# Patient Record
Sex: Male | Born: 1955 | Race: White | Hispanic: No | Marital: Single | Smoking: Current every day smoker
Health system: Southern US, Community
[De-identification: ages and names within clinical notes are randomized; demographics above are authoritative.]

## PROBLEM LIST (undated history)

## (undated) DIAGNOSIS — K5730 Diverticulosis of large intestine without perforation or abscess without bleeding: Secondary | ICD-10-CM

## (undated) DIAGNOSIS — K649 Unspecified hemorrhoids: Secondary | ICD-10-CM

## (undated) DIAGNOSIS — G4733 Obstructive sleep apnea (adult) (pediatric): Secondary | ICD-10-CM

## (undated) DIAGNOSIS — Z9884 Bariatric surgery status: Secondary | ICD-10-CM

## (undated) DIAGNOSIS — R600 Localized edema: Secondary | ICD-10-CM

## (undated) DIAGNOSIS — Z8639 Personal history of other endocrine, nutritional and metabolic disease: Secondary | ICD-10-CM

## (undated) DIAGNOSIS — K603 Anal fistula, unspecified: Secondary | ICD-10-CM

## (undated) DIAGNOSIS — Z8709 Personal history of other diseases of the respiratory system: Secondary | ICD-10-CM

## (undated) DIAGNOSIS — E785 Hyperlipidemia, unspecified: Secondary | ICD-10-CM

## (undated) DIAGNOSIS — E60 Dietary zinc deficiency: Secondary | ICD-10-CM

## (undated) DIAGNOSIS — Z973 Presence of spectacles and contact lenses: Secondary | ICD-10-CM

## (undated) DIAGNOSIS — F329 Major depressive disorder, single episode, unspecified: Secondary | ICD-10-CM

## (undated) DIAGNOSIS — I10 Essential (primary) hypertension: Secondary | ICD-10-CM

## (undated) DIAGNOSIS — J439 Emphysema, unspecified: Secondary | ICD-10-CM

## (undated) DIAGNOSIS — Z860100 Personal history of colon polyps, unspecified: Secondary | ICD-10-CM

## (undated) DIAGNOSIS — N401 Enlarged prostate with lower urinary tract symptoms: Secondary | ICD-10-CM

## (undated) DIAGNOSIS — F411 Generalized anxiety disorder: Secondary | ICD-10-CM

## (undated) DIAGNOSIS — N529 Male erectile dysfunction, unspecified: Secondary | ICD-10-CM

## (undated) DIAGNOSIS — E559 Vitamin D deficiency, unspecified: Secondary | ICD-10-CM

## (undated) HISTORY — DX: Essential (primary) hypertension: I10

## (undated) HISTORY — DX: Dietary zinc deficiency: E60

## (undated) HISTORY — DX: Bariatric surgery status: Z98.84

## (undated) HISTORY — PX: GASTROPLASTY DUODENAL SWITCH: SHX1699

## (undated) HISTORY — PX: IRRIGATION AND DEBRIDEMENT SEBACEOUS CYST: SHX5255

## (undated) HISTORY — DX: Unspecified hemorrhoids: K64.9

## (undated) HISTORY — DX: Hyperlipidemia, unspecified: E78.5

---

## 1986-03-19 HISTORY — PX: CHEST TUBE INSERTION: SHX231

## 2007-02-11 ENCOUNTER — Encounter: Payer: Self-pay | Admitting: Endocrinology

## 2007-07-29 ENCOUNTER — Encounter: Payer: Self-pay | Admitting: Endocrinology

## 2007-08-12 ENCOUNTER — Encounter: Payer: Self-pay | Admitting: Endocrinology

## 2009-03-24 ENCOUNTER — Ambulatory Visit: Payer: Self-pay | Admitting: Family Medicine

## 2009-03-24 DIAGNOSIS — I10 Essential (primary) hypertension: Secondary | ICD-10-CM | POA: Insufficient documentation

## 2009-03-24 DIAGNOSIS — E349 Endocrine disorder, unspecified: Secondary | ICD-10-CM | POA: Insufficient documentation

## 2009-03-24 DIAGNOSIS — E785 Hyperlipidemia, unspecified: Secondary | ICD-10-CM | POA: Insufficient documentation

## 2009-03-24 DIAGNOSIS — F341 Dysthymic disorder: Secondary | ICD-10-CM | POA: Insufficient documentation

## 2009-04-05 ENCOUNTER — Ambulatory Visit: Payer: Self-pay | Admitting: Family Medicine

## 2009-04-06 ENCOUNTER — Telehealth: Payer: Self-pay | Admitting: Family Medicine

## 2009-04-19 ENCOUNTER — Ambulatory Visit: Payer: Self-pay | Admitting: Family Medicine

## 2009-05-12 ENCOUNTER — Telehealth: Payer: Self-pay | Admitting: Family Medicine

## 2009-05-17 ENCOUNTER — Telehealth: Payer: Self-pay | Admitting: Family Medicine

## 2009-05-17 ENCOUNTER — Ambulatory Visit: Payer: Self-pay | Admitting: Family Medicine

## 2009-05-18 ENCOUNTER — Ambulatory Visit: Payer: Self-pay | Admitting: Family Medicine

## 2009-05-30 ENCOUNTER — Encounter: Payer: Self-pay | Admitting: Family Medicine

## 2009-06-07 ENCOUNTER — Telehealth: Payer: Self-pay | Admitting: Family Medicine

## 2009-06-08 ENCOUNTER — Ambulatory Visit: Payer: Self-pay | Admitting: Family Medicine

## 2009-06-30 ENCOUNTER — Ambulatory Visit: Payer: Self-pay | Admitting: Family Medicine

## 2009-07-06 ENCOUNTER — Encounter: Payer: Self-pay | Admitting: Family Medicine

## 2009-07-06 ENCOUNTER — Telehealth: Payer: Self-pay | Admitting: Family Medicine

## 2009-07-07 ENCOUNTER — Encounter: Payer: Self-pay | Admitting: Family Medicine

## 2009-07-13 ENCOUNTER — Encounter: Payer: Self-pay | Admitting: Family Medicine

## 2009-07-17 ENCOUNTER — Encounter: Payer: Self-pay | Admitting: Family Medicine

## 2009-07-26 ENCOUNTER — Telehealth: Payer: Self-pay | Admitting: Family Medicine

## 2009-07-26 ENCOUNTER — Ambulatory Visit: Payer: Self-pay | Admitting: Family Medicine

## 2009-07-26 ENCOUNTER — Encounter: Payer: Self-pay | Admitting: Family Medicine

## 2009-07-26 LAB — CONVERTED CEMR LAB: Testosterone: 242.06 ng/dL — ABNORMAL LOW (ref 350.00–890.00)

## 2009-08-11 ENCOUNTER — Encounter: Payer: Self-pay | Admitting: Family Medicine

## 2009-08-11 ENCOUNTER — Telehealth: Payer: Self-pay | Admitting: Family Medicine

## 2009-08-16 ENCOUNTER — Ambulatory Visit: Payer: Self-pay | Admitting: Family Medicine

## 2009-08-18 ENCOUNTER — Telehealth: Payer: Self-pay | Admitting: Family Medicine

## 2009-08-18 ENCOUNTER — Encounter: Payer: Self-pay | Admitting: Family Medicine

## 2009-09-06 ENCOUNTER — Ambulatory Visit: Payer: Self-pay | Admitting: Family Medicine

## 2009-09-14 ENCOUNTER — Encounter: Payer: Self-pay | Admitting: Family Medicine

## 2009-09-15 ENCOUNTER — Telehealth: Payer: Self-pay | Admitting: Family Medicine

## 2009-09-22 ENCOUNTER — Ambulatory Visit: Payer: Self-pay | Admitting: Family Medicine

## 2009-10-06 ENCOUNTER — Ambulatory Visit: Payer: Self-pay | Admitting: Family Medicine

## 2009-10-08 ENCOUNTER — Emergency Department (HOSPITAL_COMMUNITY): Admission: EM | Admit: 2009-10-08 | Discharge: 2009-10-08 | Payer: Self-pay | Admitting: Emergency Medicine

## 2009-10-27 ENCOUNTER — Ambulatory Visit: Payer: Self-pay | Admitting: Family Medicine

## 2009-11-09 LAB — CONVERTED CEMR LAB: Testosterone: 382.94 ng/dL (ref 350.00–890.00)

## 2009-11-10 ENCOUNTER — Ambulatory Visit: Payer: Self-pay | Admitting: Family Medicine

## 2009-12-07 ENCOUNTER — Telehealth (INDEPENDENT_AMBULATORY_CARE_PROVIDER_SITE_OTHER): Payer: Self-pay | Admitting: *Deleted

## 2009-12-16 ENCOUNTER — Ambulatory Visit: Payer: Self-pay | Admitting: Family Medicine

## 2009-12-29 ENCOUNTER — Encounter: Payer: Self-pay | Admitting: Family Medicine

## 2010-01-06 ENCOUNTER — Encounter: Payer: Self-pay | Admitting: Family Medicine

## 2010-01-26 ENCOUNTER — Ambulatory Visit: Payer: Self-pay | Admitting: Family Medicine

## 2010-01-26 DIAGNOSIS — R0989 Other specified symptoms and signs involving the circulatory and respiratory systems: Secondary | ICD-10-CM | POA: Insufficient documentation

## 2010-01-26 DIAGNOSIS — Z720 Tobacco use: Secondary | ICD-10-CM | POA: Insufficient documentation

## 2010-01-26 DIAGNOSIS — R0609 Other forms of dyspnea: Secondary | ICD-10-CM

## 2010-01-26 DIAGNOSIS — F17200 Nicotine dependence, unspecified, uncomplicated: Secondary | ICD-10-CM

## 2010-01-26 DIAGNOSIS — R5383 Other fatigue: Secondary | ICD-10-CM

## 2010-01-26 DIAGNOSIS — R609 Edema, unspecified: Secondary | ICD-10-CM | POA: Insufficient documentation

## 2010-01-26 DIAGNOSIS — E663 Overweight: Secondary | ICD-10-CM | POA: Insufficient documentation

## 2010-01-26 DIAGNOSIS — R5381 Other malaise: Secondary | ICD-10-CM | POA: Insufficient documentation

## 2010-01-26 DIAGNOSIS — M79609 Pain in unspecified limb: Secondary | ICD-10-CM | POA: Insufficient documentation

## 2010-01-27 ENCOUNTER — Ambulatory Visit: Payer: Self-pay | Admitting: Endocrinology

## 2010-01-27 LAB — CONVERTED CEMR LAB
Albumin: 4 g/dL (ref 3.5–5.2)
BUN: 14 mg/dL (ref 6–23)
Basophils Absolute: 0 10*3/uL (ref 0.0–0.1)
Chloride: 105 meq/L (ref 96–112)
Eosinophils Absolute: 0.2 10*3/uL (ref 0.0–0.7)
Eosinophils Relative: 3.4 % (ref 0.0–5.0)
Glucose, Bld: 86 mg/dL (ref 70–99)
Hemoglobin: 17.1 g/dL — ABNORMAL HIGH (ref 13.0–17.0)
Lymphs Abs: 2.2 10*3/uL (ref 0.7–4.0)
MCV: 95 fL (ref 78.0–100.0)
Neutro Abs: 4 10*3/uL (ref 1.4–7.7)
RDW: 14 % (ref 11.5–14.6)
Sodium: 141 meq/L (ref 135–145)
Uric Acid, Serum: 6.3 mg/dL (ref 4.0–7.8)

## 2010-02-06 ENCOUNTER — Telehealth: Payer: Self-pay | Admitting: Endocrinology

## 2010-02-06 ENCOUNTER — Ambulatory Visit: Payer: Self-pay | Admitting: Endocrinology

## 2010-02-20 ENCOUNTER — Ambulatory Visit: Payer: Self-pay | Admitting: Pulmonary Disease

## 2010-02-20 ENCOUNTER — Ambulatory Visit: Payer: Self-pay | Admitting: Endocrinology

## 2010-02-20 DIAGNOSIS — G4733 Obstructive sleep apnea (adult) (pediatric): Secondary | ICD-10-CM | POA: Insufficient documentation

## 2010-03-02 ENCOUNTER — Ambulatory Visit (HOSPITAL_BASED_OUTPATIENT_CLINIC_OR_DEPARTMENT_OTHER)
Admission: RE | Admit: 2010-03-02 | Discharge: 2010-03-02 | Payer: Self-pay | Source: Home / Self Care | Attending: Pulmonary Disease | Admitting: Pulmonary Disease

## 2010-03-02 ENCOUNTER — Ambulatory Visit: Admission: RE | Admit: 2010-03-02 | Payer: Self-pay | Source: Home / Self Care | Admitting: Pulmonary Disease

## 2010-03-08 ENCOUNTER — Ambulatory Visit: Payer: Self-pay | Admitting: Pulmonary Disease

## 2010-03-14 ENCOUNTER — Ambulatory Visit: Payer: Self-pay | Admitting: Endocrinology

## 2010-03-16 ENCOUNTER — Ambulatory Visit
Admission: RE | Admit: 2010-03-16 | Discharge: 2010-03-16 | Payer: Self-pay | Source: Home / Self Care | Attending: Internal Medicine | Admitting: Internal Medicine

## 2010-03-16 DIAGNOSIS — G4700 Insomnia, unspecified: Secondary | ICD-10-CM | POA: Insufficient documentation

## 2010-04-07 ENCOUNTER — Telehealth: Payer: Self-pay | Admitting: Internal Medicine

## 2010-04-12 ENCOUNTER — Other Ambulatory Visit: Payer: Self-pay | Admitting: Endocrinology

## 2010-04-12 ENCOUNTER — Ambulatory Visit
Admission: RE | Admit: 2010-04-12 | Discharge: 2010-04-12 | Payer: Self-pay | Source: Home / Self Care | Attending: Endocrinology | Admitting: Endocrinology

## 2010-04-12 LAB — TESTOSTERONE: Testosterone: 180.05 ng/dL — ABNORMAL LOW (ref 350.00–890.00)

## 2010-04-18 NOTE — Assessment & Plan Note (Signed)
Summary: med check/refill/disscuss lab results/cjr   Vital Signs:  Patient profile:   55 year old male Weight:      236 pounds O2 Sat:      97 % Temp:     98.4 degrees F Pulse rate:   92 / minute BP sitting:   120 / 78  (left arm) Cuff size:   large  Vitals Entered By: Pura Spice, RN (April 05, 2009 3:17 PM) CC: go over lab results and talk about concerta  Is Patient Diabetic? No   History of Present Illness: this 55 year old white male has been seen as a new patient and had testosterone level checked and found to be 170 which he has had past history of hypogonadism and receiving testosterone injections previously He is in to discuss further treatment as well as renewed his Concerta. Patient has been informed to lisinopril with a psychiatrist to have all of his psychotherapeutic medications I have agreed to give him his Concerta for 2 months until he can see a psychiatrist in [place] Blood pressure is stable No new complaints, patient will not see Dr. Betti Cruz so he will arrange his own psychiatric referral  Past History:  Past Medical History: history hypogonadal function chronic anxiety depression  Review of Systems  The patient denies anorexia, fever, weight loss, weight gain, vision loss, decreased hearing, hoarseness, chest pain, syncope, dyspnea on exertion, peripheral edema, prolonged cough, headaches, hemoptysis, abdominal pain, melena, hematochezia, severe indigestion/heartburn, hematuria, incontinence, genital sores, muscle weakness, suspicious skin lesions, transient blindness, difficulty walking, depression, unusual weight change, abnormal bleeding, enlarged lymph nodes, angioedema, breast masses, and testicular masses.    Physical Exam  General:  Well-developed,well-nourished,in no acute distress; alert,appropriate and cooperative throughout examinationoverweight-appearing.   Lungs:  Normal respiratory effort, chest expands symmetrically. Lungs are clear to  auscultation, no crackles or wheezes. Heart:  Normal rate and regular rhythm. S1 and S2 normal without gallop, murmur, click, rub or other extra sounds. Extremities:  No clubbing, cyanosis, edema, or deformity noted with normal full range of motion of all joints.     Impression & Recommendations:  Problem # 1:  HYPERLIPIDEMIA (ICD-272.4) Assessment Unchanged  His updated medication list for this problem includes:    Lipitor 10 Mg Tabs (Atorvastatin calcium) .Marland Kitchen... 1 by mouth once daily    Tricor 48 Mg Tabs (Fenofibrate) .Marland Kitchen... 1 qd  Problem # 2:  ESSENTIAL HYPERTENSION (ICD-401.9) Assessment: Improved  His updated medication list for this problem includes:    Bystolic 5 Mg Tabs (Nebivolol hcl) .Marland Kitchen... 2 by mouth once daily  Problem # 3:  ANXIETY DEPRESSION (ICD-300.4) Assessment: Unchanged  Problem # 4:  TESTICULAR HYPOFUNCTION (ICD-257.2) Assessment: Deteriorated  Complete Medication List: 1)  Concerta 54 Mg Cr-tabs (Methylphenidate hcl) .Marland Kitchen.. 1 by mouth once daily 2)  Cymbalta 60 Mg Cpep (Duloxetine hcl) .Marland Kitchen.. 1 by mouth once daily 3)  Lipitor 10 Mg Tabs (Atorvastatin calcium) .Marland Kitchen.. 1 by mouth once daily 4)  Tricor  .... Pt would not tell strength 5)  Wellbutrin Xl 300 Mg Xr24h-tab (Bupropion hcl) .Marland Kitchen.. 1 by mouth once daily 6)  Wellbutrin Xl 150 Mg Xr24h-tab (Bupropion hcl) .Marland Kitchen.. 1 by mouth once daily 7)  Viagra 100 Mg Tabs (Sildenafil citrate) 8)  Ambien 10 Mg Tabs (Zolpidem tartrate) 9)  Geodon 20 Mg Caps (Ziprasidone hcl) .... 2 capsules at hs 10)  Bystolic 5 Mg Tabs (Nebivolol hcl) .... 2 by mouth once daily 11)  Valium 5 Mg Tabs (Diazepam) .... Three times a day  12)  Tricor 48 Mg Tabs (Fenofibrate) .Marland Kitchen.. 1 qd 13)  Concerta 54 Mg Cr-tabs (Methylphenidate hcl) .Marland Kitchen.. 1 once daily, fill on 17 february 14)  Depo-testosterone 100 Mg/ml Oil (Testosterone cypionate) .... 2.0 cc im monthly  Patient Instructions: 1)  we'll give you a prescription for Concerta for 2 months and you can  arrange Pulmo-Aid with a psychiatrist 2)  have given you a prescription for testosterone, Depotestosterone and then you can return for injection 3)  Please arrange for psychiatric appointment as soon as hospital Prescriptions: DEPO-TESTOSTERONE 100 MG/ML OIL (TESTOSTERONE CYPIONATE) 2.0 cc IM monthly  #10 cc x 0   Entered and Authorized by:   Judithann Sheen MD   Signed by:   Judithann Sheen MD on 04/05/2009   Method used:   Print then Give to Patient   RxID:   [id number] CONCERTA 54 MG CR-TABS (METHYLPHENIDATE HCL) 1 once daily, fill on 17 February  #30 x 0   Entered and Authorized by:   Judithann Sheen MD   Signed by:   Judithann Sheen MD on 04/05/2009   Method used:   Print then Give to Patient   RxID:   [id number] CONCERTA 54 MG CR-TABS (METHYLPHENIDATE HCL) 1 by mouth once daily  #30 x 0   Entered and Authorized by:   Judithann Sheen MD   Signed by:   Judithann Sheen MD on 04/05/2009   Method used:   Print then Give to Patient   RxID:   [id number]

## 2010-04-18 NOTE — Progress Notes (Signed)
Summary: rx concerta   Phone Note Call from Patient   Caller: Patient Summary of Call: pt requesting refill concerta er 54 mg  Initial call taken by: Pura Spice, RN,  September 15, 2009 10:15 AM  Follow-up for Phone Call        per dr Scotty Court will do this time only then needs to see Dr Dimple Casey for his rx's . pt aware and will contact dr rice next time rx at front desk and pt to pick up today.  Follow-up by: Pura Spice, RN,  September 15, 2009 10:16 AM    New/Updated Medications: CONCERTA 54 MG CR-TABS (METHYLPHENIDATE HCL) 1 by mouth once daily Prescriptions: CONCERTA 54 MG CR-TABS (METHYLPHENIDATE HCL) 1 by mouth once daily  #30 x 0   Entered by:   Pura Spice, RN   Authorized by:   Judithann Sheen MD   Signed by:   Pura Spice, RN on 09/15/2009   Method used:   Print then Give to Patient   RxID:   [id number]

## 2010-04-18 NOTE — Progress Notes (Signed)
Summary: wants to speak with Dr Scotty Court  Phone Note Call from Patient Call back at [facility] Phone [phone]   Caller: Patient-live call Summary of Call: Need to speak with Dr Scotty Court, briefly. He refused to leave additional message. Initial call taken by: Warnell Forester,  April 06, 2009 1:18 PM  Follow-up for Phone Call        called by dr Scotty Court  Follow-up by: Pura Spice, RN,  April 06, 2009 4:21 PM

## 2010-04-18 NOTE — Assessment & Plan Note (Signed)
Summary: TESTOSTERONE INJ//SLM (STAFFORD)  left gluteal/gh  Nurse Visit   Medication Administration  Injection # 1:    Medication: Testosterone Cypionat 200mg  ing    Diagnosis: TESTICULAR HYPOFUNCTION (ICD-257.2)    Route: IM    Site: LUOQ gluteus    Exp Date: 03/2011    Lot #: [id number]    Mfr: paddock    Comments: 200 mg /ml --  400mg  given     Patient tolerated injection without complications    Given by: Pura Spice, RN (November 10, 2009 11:41 AM)  Orders Added: 1)  Admin of patients own med IM/SQ [phone]

## 2010-04-18 NOTE — Progress Notes (Signed)
Summary: referral  Phone Note Call from Patient   Caller: Patient Call For: Judithann Sheen MD Summary of Call: wants referral to endocrinologist re testosterone ASAP ph [phone] Initial call taken by: Raechel Ache, RN,  May 12, 2009 9:31 AM  Follow-up for Phone Call        refer to dr Juleen China per dr Alfonzo Feller  Follow-up by: Pura Spice, RN,  May 12, 2009 1:43 PM  Additional Follow-up for Phone Call Additional follow up Details #1::        Appt Scheduled.  Pt aware. Additional Follow-up by: Corky Mull,  May 16, 2009 10:48 AM

## 2010-04-18 NOTE — Medication Information (Signed)
Summary: Rx Clarification Request-Viagra  Rx Clarification Request-Viagra   Imported By: Maryln Gottron 07/14/2009 10:56:14  _____________________________________________________________________  External Attachment:    Type:   Image     Comment:   External Document

## 2010-04-18 NOTE — Progress Notes (Signed)
Summary: Flu vaccination      Immunization History:  Influenza Immunization History:    Influenza:  historical (12/07/2009)  

## 2010-04-18 NOTE — Assessment & Plan Note (Signed)
Summary: INJ/RCD  left gluteal   Nurse Visit   Medication Administration  Injection # 1:    Medication: Testosterone Cypionat 200mg  ing    Diagnosis: TESTICULAR HYPOFUNCTION (ICD-257.2)    Route: IM    Site: LUOQ gluteus    Exp Date: 07/2011    Lot #: [id number]    Mfr: watson     Patient tolerated injection without complications    Given by: Pura Spice, RN (May 17, 2009 10:35 AM)  Orders Added: 1)  Admin of patients own med IM/SQ [phone]

## 2010-04-18 NOTE — Assessment & Plan Note (Signed)
Summary: testosterone inj/njr/pt rsc/cjr/pt rescd//ccm  rt gluteal   Nurse Visit   Medication Administration  Injection # 1:    Medication: Testosterone Cypionat 200mg  ing    Diagnosis: TESTICULAR HYPOFUNCTION (ICD-257.2)    Route: IM    Site: RUOQ gluteus    Exp Date: 03/2011    Lot #: [id number]    Mfr: paddock    Comments: 400mg  given (2 Ml)     Patient tolerated injection without complications    Given by: Pura Spice, RN (October 27, 2009 2:19 PM)  Orders Added: 1)  Admin of patients own med IM/SQ [phone]

## 2010-04-18 NOTE — Letter (Signed)
Summary: Patient requesting refill on Concerta  Patient requesting refill on Concerta   Imported By: Maryln Gottron 09/20/2009 14:40:01  _____________________________________________________________________  External Attachment:    Type:   Image     Comment:   External Document

## 2010-04-18 NOTE — Letter (Signed)
Summary: Letter of recommendations for treatment plan.  Dismissal Letter   Imported By: Maryln Gottron 01/04/2010 10:28:37  _____________________________________________________________________  External Attachment:    Type:   Image     Comment:   External Document  Appended Document: Letter of recommendations for treatment plan. Letter sent by certified mail.

## 2010-04-18 NOTE — Medication Information (Signed)
Summary: Coverage Approval for Viagra  Coverage Approval for Viagra   Imported By: Maryln Gottron 07/18/2009 13:37:30  _____________________________________________________________________  External Attachment:    Type:   Image     Comment:   External Document

## 2010-04-18 NOTE — Progress Notes (Signed)
Summary: questions about testesterone inj   Phone Note Call from Patient   Caller: Patient Summary of Call: pt came in today for testesterone inj and wnats to know if can increase or come in sooner than monthly. States he thinks needs sooner than 1 month. pls call  Initial call taken by: Pura Spice, RN,  May 17, 2009 10:38 AM  Follow-up for Phone Call        dr Scotty Court called pt and instructed to take 400mg  testesterone inj every 3 weeks x 4 then reck testesterone level. pt to come in today for injection.  Follow-up by: Pura Spice, RN,  May 18, 2009 8:06 AM

## 2010-04-18 NOTE — Assessment & Plan Note (Signed)
Summary: NEW ENDO LOW TESTOSTORONE PT-PER PT PER DR STAFFORD-#-BCBS--P...   Vital Signs:  Patient profile:   55 year old male Height:      73 inches (185.42 cm) Weight:      262.13 pounds (119.15 kg) BMI:     34.71 O2 Sat:      94 % on Room air Temp:     98.5 degrees F (36.94 degrees C) oral Pulse rate:   84 / minute BP sitting:   126 / 76  (left arm) Cuff size:   large  Vitals Entered By: Brenton Grills CMA (AAMA) (January 27, 2010 10:02 AM)  O2 Flow:  Room air CC: New Endo/Lost Testosterone/Dr. Patsey Berthold Is Patient Diabetic? No   Referring Provider:  Judithann Sheen MD Primary Provider:  Judithann Sheen MD  CC:  New Endo/Lost Testosterone/Dr. Patsey Berthold.  History of Present Illness: pt was dx'ed with hpogonadism in 2009.  no cause was found.  he has been on testosterone injections off and on since then.  most recently, he has been on 400 mg im every 2 weeks.   symptomatically, he has a few years of moderate muscle weakness, worst at the arms, and assoc decreased libido.  he says he was advised by 2 psychiatrists to keep his etstosterone level at 120% of normal.  Current Medications (verified): 1)  Concerta 54 Mg Cr-Tabs (Methylphenidate Hcl) .Marland Kitchen.. 1 By Mouth Once Daily 2)  Lipitor 10 Mg Tabs (Atorvastatin Calcium) .Marland Kitchen.. 1 By Mouth Once Daily 3)  Tricor .... Pt Would Not Tell Strength 4)  Wellbutrin Xl 300 Mg Xr24h-Tab (Bupropion Hcl) .Marland Kitchen.. 1 By Mouth Once Daily 5)  Wellbutrin Xl 150 Mg Xr24h-Tab (Bupropion Hcl) .Marland Kitchen.. 1 By Mouth Once Daily 6)  Viagra 100 Mg Tabs (Sildenafil Citrate) 7)  Ambien 10 Mg Tabs (Zolpidem Tartrate) .... Take 1 By Mouth Every Night 8)  Geodon 20 Mg Caps (Ziprasidone Hcl) .... 2 Capsules At [facility] 9)  Bystolic 5 Mg Tabs (Nebivolol Hcl) .... 2 By Mouth Once Daily 10)  Valium 5 Mg Tabs (Diazepam) .... Three Times A Day 11)  Tricor 48 Mg Tabs (Fenofibrate) .Marland Kitchen.. 1 Qd 12)  Concerta 54 Mg Cr-Tabs (Methylphenidate Hcl) .Marland Kitchen.. 1 Once Daily, Fill On  June 08 2009 13)  Depo-Testosterone 200 Mg/ml Oil (Testosterone Cypionate) .... Inject  400mg  Im Every 2 Weeks For 2 Months Then Resume Every 3 Wks. 14)  Moviprep 100 Gm Solr (Peg-Kcl-Nacl-Nasulf-Na Asc-C) .... Take As Directed. 15)  Clobetasol Propionate 0.05 % Soln (Clobetasol Propionate) .... Use To Scalp As Directed 16)  Cymbalta 30 Mg Cpep (Duloxetine Hcl) .... Take 3 Per Day 17)  Lasix 20 Mg Tabs (Furosemide) .Marland Kitchen.. 1 Tab By Mouth Daily As Needed Edema 18)  Naproxen 500 Mg Tabs (Naproxen) .Marland Kitchen.. 1 Tab By Mouth Two Times A Day As Needed Pain With Food  Allergies (verified): No Known Drug Allergies  Past History:  Past Medical History: Last updated: 04/06/2009 history hypogonadal function chronic anxiety depression testing for HIV  Hyperlipidemia Urnary track Infections  Family History: Reviewed history from 04/06/2009 and no changes required. parents ovary canncer  grandparents elevated cholesterol heart diasase stroke high blood pressure.  Dr Jacklynn Barnacle [place]  Dr Loyal Gambler, [place]  no probs with testosterone  Social History: Reviewed history from 04/06/2009 and no changes required. Occupation: [employer] Current Smoker Alcohol Use - yes Regular Exercise - no Does Patient Exercise:  no Seat Belt Use:  yes  Review  of Systems       The patient complains of weight gain and peripheral edema.         denies polyuria, numbness, decreased urinary stream, gynecomastia, fever, headache, easy bruising, sob, rash, blurry vision, chest pain.  he reports fatigue, but says that could be due to sleep apnea. he says depression improves with normalization of his testosterone level.    viagra helps ed sxs.    Physical Exam  General:  normal appearance.   Head:  head: no deformity eyes: no periorbital swelling, no proptosis external nose and ears are normal mouth: no lesion seen Neck:  No deformities, masses, or tenderness noted. Breasts:   no gynecomastia Lungs:  Clear to auscultation bilaterally. Normal respiratory effort.  Heart:  Regular rate and rhythm without murmurs or gallops noted. Normal S1,S2.   Abdomen:  abdomen is soft, nontender.  no hepatosplenomegaly.   not distended.  no hernia.  Msk:  muscle bulk and strength are grossly normal.  no obvious joint swelling.  gait is normal and steady  Pulses:  dorsalis pedis intact bilat.  no carotid bruit  Extremities:  no deformity.  no ulcer on the feet.  feet are of normal color and temp.   1+ right pedal edema and 1+ left pedal edema.   Neurologic:  cn 2-12 grossly intact.   readily moves all 4's.   sensation is intact to touch on the feet  Skin:  normal texture and temp.  no rash.  not diaphoretic  Cervical Nodes:  No significant adenopathy.  Psych:  Alert and cooperative; normal mood and affect; normal attention span and concentration.   Additional Exam:  last testosterone level (16 days after most recent dose, of 200 mg) was 383   Impression & Recommendations:  Problem # 1:  TESTICULAR HYPOFUNCTION (ICD-257.2) uncertain etiology his request to keep his testosterone level at 120% of normal cannot safely be honored.    Problem # 2:  ANXIETY DEPRESSION (ICD-300.4) ? worsened by #1  Problem # 3:  sleep apnea could be exac by testosterone rx  Problem # 4:  PERIPHERAL EDEMA (ICD-782.3) could be caused or exac by testosterone injections  Medications Added to Medication List This Visit: 1)  Depo-testosterone 200 Mg/ml Oil (Testosterone cypionate) .Marland Kitchen.. 100 mg (0.5 cc), every week 2)  Syringe 22g X 1" 3 Ml Misc (Syringe/needle (disp)) .Marland Kitchen.. 1 every week, and 23 g needles for injection  Other Orders: Consultation Level IV (45409)  Patient Instructions: 1)  normalization of testosterone is not known to harm you.  however, there are "theoretical" risks, including increased fertility, hair loss, prostate cancer, benign prostate enlargement, lower hdl ("good  cholesterol"), sleep apnea, and behavior changes. 2)  please sign release of infomation for 2009 blood tests with dr Delford Field [facility], [place]). 3)  take testosterone 100 mg intramuscularly, every week. 4)  just before the 4th dose, go to the lab for testosterone level 257.2. 5)  please call [phone] to hear your test results. Prescriptions: DEPO-TESTOSTERONE 200 MG/ML OIL (TESTOSTERONE CYPIONATE) 100 mg (0.5 cc), every week  #10 cc x 1   Entered and Authorized by:   Minus Breeding MD   Signed by:   Minus Breeding MD on 01/27/2010   Method used:   Print then Give to Patient   RxID:   [id number] SYRINGE 22G X 1" 3 ML MISC (SYRINGE/NEEDLE (DISP)) 1 every week, and 23 g needles for injection  #10 x 1   Entered and Authorized by:  Minus Breeding MD   Signed by:   Minus Breeding MD on 01/27/2010   Method used:   Print then Give to Patient   RxID:   [id number] DEPO-TESTOSTERONE 200 MG/ML OIL (TESTOSTERONE CYPIONATE) 100 mg (0.5 cc), every week  #1 vial x 1   Entered and Authorized by:   Minus Breeding MD   Signed by:   Minus Breeding MD on 01/27/2010   Method used:   Print then Give to Patient   RxID:   [id number]    Orders Added: 1)  Consultation Level IV [84696]

## 2010-04-18 NOTE — Progress Notes (Signed)
Summary: rx cymbalta ok x 1 only   Phone Note Call from Patient   Caller: Patient Summary of Call: psychiatriast out of town wants refill cymbalta  Initial call taken by: Pura Spice, RN,  August 18, 2009 5:16 PM  Follow-up for Phone Call        ok per dr Scotty Court  x 1 only  Follow-up by: Pura Spice, RN,  August 18, 2009 5:16 PM    New/Updated Medications: CYMBALTA 30 MG CPEP (DULOXETINE HCL) take 3 per day Prescriptions: CYMBALTA 30 MG CPEP (DULOXETINE HCL) take 3 per day  #90 x 0   Entered by:   Pura Spice, RN   Authorized by:   Judithann Sheen MD   Signed by:   Pura Spice, RN on 08/18/2009   Method used:   Electronically to        [facility]. [phone]* (retail)       [address]       [place], [place]  [address]       Ph: [phone]       Fax: [phone]   RxID:   [id number]

## 2010-04-18 NOTE — Progress Notes (Signed)
Summary: List of Patient Medications  List of Patient Medications   Imported By: Maryln Gottron 03/29/2009 11:17:56  _____________________________________________________________________  External Attachment:    Type:   Image     Comment:   External Document

## 2010-04-18 NOTE — Progress Notes (Signed)
Summary: records  from dr Juleen China   Phone Note Call from Patient   Caller: Patient Summary of Call: WANTS  CLOBETASOL PROPIONATE TOPICAL SLLUTION 0.05% FOR SCALP TO [facility]. ALSO, WANTS COPY REPORT FROM ENDRICRINOLOGIST TO BE FAXED TO HIM TO TO TAKE TO PSYCHIATRIST.  Initial call taken by: Pura Spice, RN,  Jul 26, 2009 11:18 AM  Follow-up for Phone Call        ok med called in no records from Dr Juleen China found. Tell him to call their office and request his records be sent to his psychiatrist thanks Follow-up by: Pura Spice, RN,  Jul 26, 2009 11:53 AM  Additional Follow-up for Phone Call Additional follow up Details #1::        Phone Call Completed Additional Follow-up by: Rudy Jew, RN,  Jul 26, 2009 4:34 PM    New/Updated Medications: CLOBETASOL PROPIONATE 0.05 % SOLN (CLOBETASOL PROPIONATE) use to scalp as directed Prescriptions: CLOBETASOL PROPIONATE 0.05 % SOLN (CLOBETASOL PROPIONATE) use to scalp as directed  #1 x 6   Entered by:   Pura Spice, RN   Authorized by:   Judithann Sheen MD   Signed by:   Pura Spice, RN on 07/26/2009   Method used:   Electronically to        [facility]. [phone]* (retail)       [address]       [place], [place]  [address]       Ph: [phone]       Fax: [phone]   RxID:   [id number]

## 2010-04-18 NOTE — Letter (Signed)
Summary: Letter from patient  Letter from patient   Imported By: Lester [place] 02/13/2010 08:57:57  _____________________________________________________________________  External Attachment:    Type:   Image     Comment:   External Document

## 2010-04-18 NOTE — Assessment & Plan Note (Signed)
Summary: TESTOSTERONE INJ // RS  left gluteal   Nurse Visit   Medication Administration  Injection # 1:    Medication: Testosterone Cypionat 200mg  ing    Diagnosis: TESTICULAR HYPOFUNCTION (ICD-257.2)    Route: IM    Site: LUOQ gluteus    Exp Date: 03/2011    Lot #: [id number]    Mfr: padock    Patient tolerated injection without complications    Given by: Pura Spice, RN (September 06, 2009 11:48 AM)  Orders Added: 1)  Admin of patients own med IM/SQ [phone]

## 2010-04-18 NOTE — Miscellaneous (Signed)
Summary: Injection Record/[facility]  Injection Record/[facility]   Imported By: Sherian Rein 02/06/2010 07:20:21  _____________________________________________________________________  External Attachment:    Type:   Image     Comment:   External Document

## 2010-04-18 NOTE — Assessment & Plan Note (Signed)
Summary: testosterone inj/cjr  Nurse Visit   Medication Administration  Injection # 1:    Medication: Testosterone Cypionat 200mg  ing    Diagnosis: TESTICULAR HYPOFUNCTION (ICD-257.2)    Route: IM    Site: RUOQ gluteus    Exp Date: 07/19/2011    Lot #: [id number]    Mfr: watson    Patient tolerated injection without complications    Given by: Lynann Beaver CMA (April 19, 2009 11:29 AM)  Orders Added: 1)  Testosterone Cypionat 200mg  ing [J1080] 2)  Admin of patients own med IM/SQ [[id number]]

## 2010-04-18 NOTE — Assessment & Plan Note (Signed)
Summary: consult re: sleep apnea and pain in feet/cjr   Vital Signs:  Patient profile:   55 year old male Weight:      262 pounds (119.09 kg) O2 Sat:      97 % on Room air Temp:     98.3 degrees F (36.83 degrees C) oral Pulse rate:   87 / minute BP sitting:   130 / 80  (left arm) Cuff size:   large  Vitals Entered By: Josph Macho RMA (January 26, 2010 11:58 AM)  O2 Flow:  Room air CC: Trouble sleeping Xyears/ bottom of heels hurt X3 weeks/ hands retaining water X2 months/ CF Is Patient Diabetic? No   History of Present Illness: Patient is a 55 yo caucasian male in today for multiple c/o. He is c/o. is concerned about fluid retention and notes significant swelling in his hands off and on for 2 months. He feels tight and uncomfortable at times warm and sometimes even slightly red. He's also complaining of ongoing she was sleeping in another CBC as sleep apnea trouble falling asleep, staying asleep nor significantly even wakes himself up snoring. Has ongoing fatigue is almost debilitating and denies headaches. Denies chest pain, shortness of breath, recent illness, GI or GU complaints he is down to a half pack per day smoking from a high of 2 packs per day. His other major complaints of bilateral pain is worse when he first arises in the morning or after prolonged sitting the pain is excruciating with pressure. He denies any injuries or any similar history in the past. Has not tried any over-the-counter medications or changes with you she wear to date.  Current Medications (verified): 1)  Concerta 54 Mg Cr-Tabs (Methylphenidate Hcl) .Marland Kitchen.. 1 By Mouth Once Daily 2)  Lipitor 10 Mg Tabs (Atorvastatin Calcium) .Marland Kitchen.. 1 By Mouth Once Daily 3)  Tricor .... Pt Would Not Tell Strength 4)  Wellbutrin Xl 300 Mg Xr24h-Tab (Bupropion Hcl) .Marland Kitchen.. 1 By Mouth Once Daily 5)  Wellbutrin Xl 150 Mg Xr24h-Tab (Bupropion Hcl) .Marland Kitchen.. 1 By Mouth Once Daily 6)  Viagra 100 Mg Tabs (Sildenafil Citrate) 7)  Ambien 10  Mg Tabs (Zolpidem Tartrate) .... Take 1 By Mouth Every Night 8)  Geodon 20 Mg Caps (Ziprasidone Hcl) .... 2 Capsules At [facility] 9)  Bystolic 5 Mg Tabs (Nebivolol Hcl) .... 2 By Mouth Once Daily 10)  Valium 5 Mg Tabs (Diazepam) .... Three Times A Day 11)  Tricor 48 Mg Tabs (Fenofibrate) .Marland Kitchen.. 1 Qd 12)  Concerta 54 Mg Cr-Tabs (Methylphenidate Hcl) .Marland Kitchen.. 1 Once Daily, Fill On June 08 2009 13)  Depo-Testosterone 200 Mg/ml Oil (Testosterone Cypionate) .... Inject  400mg  Im Every 2 Weeks For 2 Months Then Resume Every 3 Wks. 14)  Moviprep 100 Gm Solr (Peg-Kcl-Nacl-Nasulf-Na Asc-C) .... Take As Directed. 15)  Clobetasol Propionate 0.05 % Soln (Clobetasol Propionate) .... Use To Scalp As Directed 16)  Cymbalta 30 Mg Cpep (Duloxetine Hcl) .... Take 3 Per Day  Allergies (verified): No Known Drug Allergies  Past History:  Past medical history reviewed for relevance to current acute and chronic problems. Social history (including risk factors) reviewed for relevance to current acute and chronic problems.  Past Medical History: Reviewed history from 04/06/2009 and no changes required. history hypogonadal function chronic anxiety depression testing for HIV  Hyperlipidemia Urnary track Infections  Social History: Reviewed history from 04/06/2009 and no changes required. Occupation: [employer] Current Smoker  Review of Systems      See  HPI  Physical Exam  General:  Well-developed,well-nourished,in no acute distress; alert,appropriate and cooperative throughout examination Head:  Normocephalic and atraumatic without obvious abnormalities. No apparent alopecia or balding. Mouth:  Oral mucosa and oropharynx without lesions or exudates.  Teeth in good repair. Neck:  No deformities, masses, or tenderness noted. Lungs:  Normal respiratory effort, chest expands symmetrically. Lungs are clear to auscultation, no crackles or wheezes. Heart:  Normal rate and regular rhythm. S1 and S2  normal without gallop, murmur, click, rub or other extra sounds. Abdomen:  Bowel sounds positive,abdomen soft and non-tender without masses, organomegaly or hernias noted. Obese Extremities:  No clubbing, cyanosis, or deformity noted with normal full range of motion of all joints.  Hands are swollen b/l. Pain with palp over heel b/l Psych:  Cognition and judgment appear intact. Alert and cooperative with normal attention span and concentration. No apparent delusions, illusions, hallucinations   Impression & Recommendations:  Problem # 1:  HEEL PAIN, BILATERAL (ICD-729.5)  b/l plantar fasciitis, apply ice and then Aspercreme  to heels two times a day. Stretch as directed to calf and foot daily. Refer to podiatry for persistent pain. Add some Dr Margart Sickles sports gel inserts to shoes. Report worsening symptoms.  Orders: Podiatry Referral (Podiatry) TLB-Uric Acid, Blood (84550-URIC)  Problem # 2:  FATIGUE (ICD-780.79)  Orders: Sleep Disorder Referral (Sleep Disorder) Specimen Handling ([address]109) Venipuncture ([address]) TLB-CBC Platelet - w/Differential (85025-CBCD) Needs sleep study and further evaluation  Problem # 3:  PERIPHERAL EDEMA (ICD-782.3)  His updated medication list for this problem includes:    Lasix 20 Mg Tabs (Furosemide) .Marland Kitchen... 1 tab by mouth daily as needed edema  Orders: Podiatry Referral (Podiatry) Specimen Handling (09811) Venipuncture (91478) TLB-Renal Function Panel (80069-RENAL) Encouraged daily stretching, icing, and Aspercreme two times a day   Problem # 4:  TOBACCO ABUSE (ICD-305.1)  Orders: Tobacco use cessation intermediate 3-10 minutes (29562) Encouraged complete cessation once again  Complete Medication List: 1)  Concerta 54 Mg Cr-tabs (Methylphenidate hcl) .Marland Kitchen.. 1 by mouth once daily 2)  Lipitor 10 Mg Tabs (Atorvastatin calcium) .Marland Kitchen.. 1 by mouth once daily 3)  Tricor  .... Pt would not tell strength 4)  Wellbutrin Xl 300 Mg Xr24h-tab (Bupropion hcl) .Marland Kitchen..  1 by mouth once daily 5)  Wellbutrin Xl 150 Mg Xr24h-tab (Bupropion hcl) .Marland Kitchen.. 1 by mouth once daily 6)  Viagra 100 Mg Tabs (Sildenafil citrate) 7)  Ambien 10 Mg Tabs (Zolpidem tartrate) .... Take 1 by mouth every night 8)  Geodon 20 Mg Caps (Ziprasidone hcl) .... 2 capsules at hs 9)  Bystolic 5 Mg Tabs (Nebivolol hcl) .... 2 by mouth once daily 10)  Valium 5 Mg Tabs (Diazepam) .... Three times a day 11)  Tricor 48 Mg Tabs (Fenofibrate) .Marland Kitchen.. 1 qd 12)  Concerta 54 Mg Cr-tabs (Methylphenidate hcl) .Marland Kitchen.. 1 once daily, fill on June 08 2009 13)  Depo-testosterone 200 Mg/ml Oil (Testosterone cypionate) .... Inject  400mg  im every 2 weeks for 2 months then resume every 3 wks. 14)  Moviprep 100 Gm Solr (Peg-kcl-nacl-nasulf-na asc-c) .... Take as directed. 15)  Clobetasol Propionate 0.05 % Soln (Clobetasol propionate) .... Use to scalp as directed 16)  Cymbalta 30 Mg Cpep (Duloxetine hcl) .... Take 3 per day 17)  Lasix 20 Mg Tabs (Furosemide) .Marland Kitchen.. 1 tab by mouth daily as needed edema 18)  Naproxen 500 Mg Tabs (Naproxen) .Marland Kitchen.. 1 tab by mouth two times a day as needed pain with food  Patient Instructions: 1)  Please schedule  a follow-up appointment in 1 to 2 months with Dr Scotty Court 2)  Stretch calves and feet daily as directed 3)  Apply ice and Aspercreme to heels two times a day  4)  Dr Margart Sickles sports gel shoe inserts in shoes  5)  Limit your Sodium(salt) . to try and minimize edema 6)  You need to lose weight. Consider a lower calorie diet and regular exercise. Remember small, frequent meals with lean proteins and complex carbs 7)  Stop smoking tips: Choose a quit date. Cut down before the quit date. Decide what you will do as a substitute when you feel the urge to smoke(gum, toothpick, exercise).  Prescriptions: NAPROXEN 500 MG TABS (NAPROXEN) 1 tab by mouth two times a day as needed pain with food  #60 x 1   Entered and Authorized by:   Danise Edge MD   Signed by:   Danise Edge MD on  01/26/2010   Method used:   Electronically to        [facility]. [phone]* (retail)       [address]       [place], [place]  [address]       Ph: [phone]       Fax: [phone]   RxID:   [id number] LASIX 20 MG TABS (FUROSEMIDE) 1 tab by mouth daily as needed edema  #30 x 1   Entered and Authorized by:   Danise Edge MD   Signed by:   Danise Edge MD on 01/26/2010   Method used:   Electronically to        [facility]. [phone]* (retail)       [address]       [place], [place]  [address]       Ph: [phone]       Fax: [phone]   RxID:   [id number]    Orders Added: 1)  Sleep Disorder Referral [Sleep Disorder] 2)  Podiatry Referral [Podiatry] 3)  Specimen Handling [99000] 4)  Venipuncture [36415] 5)  TLB-Renal Function Panel [80069-RENAL] 6)  TLB-CBC Platelet - w/Differential [85025-CBCD] 7)  TLB-Uric Acid, Blood [84550-URIC] 8)  Tobacco use cessation intermediate 3-10 minutes [99406] 9)  Est. Patient Level IV [16010]

## 2010-04-18 NOTE — Assessment & Plan Note (Signed)
Summary: Testosterone inj/cb #3 of 4  Nurse Visit   Medication Administration  Injection # 1:    Medication: Testosterone Cypionat 200mg  ing    Diagnosis: TESTICULAR HYPOFUNCTION (ICD-257.2)    Route: IM    Site: RUOQ gluteus    Exp Date: 07/2011    Lot #: [id number]    Mfr: watson    Comments: 200mg /ml 2ml given     Patient tolerated injection without complications    Given by: Pura Spice, RN (June 30, 2009 12:43 PM)  Orders Added: 1)  Admin of patients own med IM/SQ [02725D] Prescriptions: DEPO-TESTOSTERONE 200 MG/ML OIL (TESTOSTERONE CYPIONATE) inject  400mg  IM every 3 weeks.  #10 cc x 5   Entered by:   Pura Spice, RN   Authorized by:   Judithann Sheen MD   Signed by:   Pura Spice, RN on 06/30/2009   Method used:   Telephoned to ...       [facility]  [place]. [phone]* (retail)       [address]       [place], [place]  [address]       Ph: [phone]       Fax: [phone]   RxID:   [id number]

## 2010-04-18 NOTE — Progress Notes (Signed)
Summary: rx concerta and ambien   Phone Note Call from Patient   Caller: Patient Call For: Judithann Sheen MD Summary of Call: Pt needs to pick up prescriptions for Ambien 10 mg. (given by a different MD) and Concerta tomorrow when he comes for his injection. [phone] [facility]) Initial call taken by: Lynann Beaver CMA,  June 07, 2009 8:20 AM  Follow-up for Phone Call        ok they are ready  Follow-up by: Pura Spice, RN,  June 07, 2009 8:44 AM    New/Updated Medications: AMBIEN 10 MG TABS (ZOLPIDEM TARTRATE) take 1 by mouth every night CONCERTA 54 MG CR-TABS (METHYLPHENIDATE HCL) 1 once daily, fill on June 08 2009 Prescriptions: AMBIEN 10 MG TABS (ZOLPIDEM TARTRATE) take 1 by mouth every night  #30 x 5   Entered by:   Pura Spice, RN   Authorized by:   Judithann Sheen MD   Signed by:   Pura Spice, RN on 06/07/2009   Method used:   Print then Give to Patient   RxID:   [id number] CONCERTA 54 MG CR-TABS (METHYLPHENIDATE HCL) 1 once daily, fill on June 08 2009  #30 x 0   Entered by:   Pura Spice, RN   Authorized by:   Judithann Sheen MD   Signed by:   Pura Spice, RN on 06/07/2009   Method used:   Print then Give to Patient   RxID:   [id number]

## 2010-04-18 NOTE — Assessment & Plan Note (Signed)
Summary: testosterone inj/cjr  Nurse Visit   Medication Administration  Injection # 1:    Medication: Testosterone Cypionat 200mg  ing    Diagnosis: TESTICULAR HYPOFUNCTION (ICD-257.2)    Route: IM    Site: RUOQ gluteus    Exp Date: 02/2011    Lot #: [id number]    Mfr: watson     Comments: 400mg  given (2ml)     Patient tolerated injection without complications    Given by: Pura Spice, RN (Aug 16, 2009 2:33 PM)  Orders Added: 1)  Admin of patients own med IM/SQ [phone]

## 2010-04-18 NOTE — Medication Information (Signed)
Summary: Prior Authorization Request for Cymbalta  Prior Authorization Request for Cymbalta   Imported By: Maryln Gottron 08/24/2009 15:10:45  _____________________________________________________________________  External Attachment:    Type:   Image     Comment:   External Document

## 2010-04-18 NOTE — Assessment & Plan Note (Signed)
Summary: testosterone inj/cjr  Nurse Visit   Orders Added: 1)  Venipuncture [36415] 2)  TLB-Testosterone, Total [84403-TESTO]   DOCUMENTED ON VISIT WITH LAB DRAW....GHRN......

## 2010-04-18 NOTE — Letter (Signed)
Summary: Note from patient regarding constipation  Note from patient regarding constipation   Imported By: Maryln Gottron 07/11/2009 13:06:25  _____________________________________________________________________  External Attachment:    Type:   Image     Comment:   External Document

## 2010-04-18 NOTE — Assessment & Plan Note (Signed)
Summary: testosterone inj/per Gina/cjr #1 of 4  Nurse Visit   Medication Administration  Injection # 1:    Medication: Testosterone Cypionat 200mg  ing    Diagnosis: TESTICULAR HYPOFUNCTION (ICD-257.2)    Route: IM    Site: RUOQ gluteus    Exp Date: 07/2011    Lot #: [id number]    Mfr: watson    Comments: per dr Scotty Court give  400mg  today     Patient tolerated injection without complications    Given by: Pura Spice, RN (May 18, 2009 10:36 AM)  Orders Added: 1)  Admin of patients own med IM/SQ [phone]

## 2010-04-18 NOTE — Letter (Signed)
Summary: Letter from patient   Letter from patient   Imported By: Maryln Gottron 01/11/2010 12:11:09  _____________________________________________________________________  External Attachment:    Type:   Image     Comment:   External Document

## 2010-04-18 NOTE — Letter (Signed)
Summary: Correspondence from patient regarding Medication Refill  Correspondence from patient regarding Medication Refill   Imported By: Maryln Gottron 08/01/2009 10:53:33  _____________________________________________________________________  External Attachment:    Type:   Image     Comment:   External Document

## 2010-04-18 NOTE — Assessment & Plan Note (Signed)
Summary: TESTOSTERONE INJ/NJR/PT RSC PER GINA/CJR#2 of 4   Nurse Visit   Medication Administration  Injection # 1:    Medication: Testosterone Cypionat 200mg  ing    Diagnosis: TESTICULAR HYPOFUNCTION (ICD-257.2)    Route: IM    Site: LUOQ gluteus    Exp Date: 07/2011    Lot #: [id number]    Mfr: watson     Comments: 400mg  given     Patient tolerated injection without complications    Given by: Pura Spice, RN (June 08, 2009 11:07 AM)  Orders Added: 1)  Admin of patients own med IM/SQ [phone]

## 2010-04-18 NOTE — Letter (Signed)
Summary: [facility]   Imported By: Sherian Rein 02/06/2010 07:17:52  _____________________________________________________________________  External Attachment:    Type:   Image     Comment:   External Document

## 2010-04-18 NOTE — Assessment & Plan Note (Signed)
Summary: testosterone inj/cjr#1 to have q 2 wks x 2 months :then q 3 wks  Nurse Visit   Medication Administration  Injection # 1:    Medication: Testosterone Cypionat 200mg  ing    Diagnosis: TESTICULAR HYPOFUNCTION (ICD-257.2)    Route: IM    Site: RUOQ gluteus    Exp Date: 03/2011    Lot #: [id number]    Mfr: paddock     Comments: 400mg  given (2 ML) per Dr Scotty Court pt to come in every 2 wks for 2 months rthen resume every 3 wks    Patient tolerated injection without complications    Given by: Pura Spice, RN (October 06, 2009 3:45 PM)  Orders Added: 1)  Admin of patients own med IM/SQ [phone] Prescriptions: DEPO-TESTOSTERONE 200 MG/ML OIL (TESTOSTERONE CYPIONATE) inject  400mg  IM every 2 weeks for 2 months then resume every 3 wks.  #1 x 1   Entered by:   Pura Spice, RN   Authorized by:   Judithann Sheen MD   Signed by:   Pura Spice, RN on 10/06/2009   Method used:   Telephoned to ...       [facility]  [place]. [phone]* (retail)       [address]       [place], [place]  [address]       Ph: [phone]       Fax: [phone]   RxID:   [id number]

## 2010-04-18 NOTE — Assessment & Plan Note (Signed)
Summary: TESTESTERONE INJ//SLM  rt gluteal   Nurse Visit   Medication Administration  Injection # 1:    Medication: Testosterone Cypionat 200mg  ing    Diagnosis: TESTICULAR HYPOFUNCTION (ICD-257.2)    Route: IM    Site: RUOQ gluteus    Exp Date: 04/2011    Lot #: [id number]    Mfr: paddock    Comments: 400mg  given     Patient tolerated injection without complications    Given by: Pura Spice, RN (December 16, 2009 1:23 PM)  Orders Added: 1)  Admin of patients own med IM/SQ [phone]

## 2010-04-18 NOTE — Medication Information (Signed)
Summary: Rx for Concerta ER  Rx for Concerta ER   Imported By: Maryln Gottron 08/18/2009 13:22:38  _____________________________________________________________________  External Attachment:    Type:   Image     Comment:   External Document

## 2010-04-18 NOTE — Medication Information (Signed)
Summary: Patient request for refill of Cymbalta  Patient request for refill of Cymbalta   Imported By: Maryln Gottron 08/23/2009 14:12:59  _____________________________________________________________________  External Attachment:    Type:   Image     Comment:   External Document

## 2010-04-18 NOTE — Progress Notes (Signed)
Summary: lab order  Phone Note Call from Patient Call back at Home Phone [phone]   Caller: Patient Summary of Call: Pt informed of MD's advisement for testosterone injections, pt would like to come in today to have lab work done to have levels checked. OK to place order in IDX? Initial call taken by: Brenton Grills CMA Duncan Dull),  February 06, 2010 11:55 AM  Follow-up for Phone Call        ok testosterone 257.2 Follow-up by: Minus Breeding MD,  February 06, 2010 12:43 PM  Additional Follow-up for Phone Call Additional follow up Details #1::        pt informed. Lab order placed in IDX Additional Follow-up by: Brenton Grills CMA Duncan Dull),  February 06, 2010 1:46 PM

## 2010-04-18 NOTE — Progress Notes (Signed)
Summary: rx movi prep   Phone Note Call from Patient   Caller: Patient Summary of Call: ptvrequesting "moviPrep for constipation  and stated enemas not working.  Stated Dr in [facility] was given this.  [facility]  Initial call taken by: Pura Spice, RN,  July 06, 2009 4:46 PM  Follow-up for Phone Call        ok per dr Scotty Court  pt aware  Follow-up by: Pura Spice, RN,  July 06, 2009 4:46 PM    New/Updated Medications: MOVIPREP 100 GM SOLR (PEG-KCL-NACL-NASULF-NA ASC-C) take as directed. Prescriptions: MOVIPREP 100 GM SOLR (PEG-KCL-NACL-NASULF-NA ASC-C) take as directed.  #1 x 0   Entered by:   Pura Spice, RN   Authorized by:   Judithann Sheen MD   Signed by:   Pura Spice, RN on 07/06/2009   Method used:   Electronically to        [facility]. [phone]* (retail)       [address]       [place], [place]  [address]       Ph: [phone]       Fax: [phone]   RxID:   [id number]

## 2010-04-18 NOTE — Assessment & Plan Note (Signed)
Summary: NEW PT EST // RS   Vital Signs:  Patient profile:   55 year old male Weight:      235 pounds O2 Sat:      97 % Temp:     98.7 degrees F Pulse rate:   95 / minute BP sitting:   120 / 80  (left arm)  Vitals Entered By: Pura Spice, RN (March 24, 2009 1:39 PM) CC: to establish states had 6 wks ago his testestrone level ck by a doctor in [place]. states he don't know name of the doctor.  pt wants refills but don't know strength of some of his meds.    History of Present Illness: This 55 year old white male who is known patient of mine many years ago is asking to return as a new patient. He relates a history of having had hypodynamic dysfunction or sometimes with low readings of testosterone 150 he has been on therapy of urologists as well as several physicians in the past receiving testosterone injection but continues to have problem. He has also had problems with severe depression and an abdominal psychiatrist treatment internal stone Dr. Enzo Montgomery who has the patient on multiple psychotropic medication and he is asking for some refills. 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 He relates he came home because her mother had uterine cancer and is seriously he'll appear PS control hypertension He has smoked since he was 55 years old am a mass about 5-6 cigarettes per. 2 check testosterone level today and probably refer to urologist or endocrinologist Also to refer to Dr. Betti Cruz for psychiatric treatment  Preventive Screening-Counseling & Management  Alcohol-Tobacco     Smoking Status: quit     Packs/Day: 0.25     Year Started: 1970  Past History:  Past Medical History: history hypogonadal function  Past History:  Care Management:  Dr Elenore Rota in [place], [place] Dr. Bertram Millard, [place],[employer]   Social History: Smoking Status:  quit Packs/Day:   0.25  Review of Systems  The patient denies anorexia, fever, weight loss, weight gain, vision loss, decreased hearing, hoarseness, chest pain, syncope, dyspnea on exertion, peripheral edema, prolonged cough, headaches, hemoptysis, abdominal pain, melena, hematochezia, severe indigestion/heartburn, hematuria, incontinence, genital sores, muscle weakness, suspicious skin lesions, transient blindness, difficulty walking, depression, unusual weight change, abnormal bleeding, enlarged lymph nodes, angioedema, breast masses, and testicular masses.    Physical Exam  General:  Well-developed,well-nourished,in no acute distress; alert,appropriate and cooperative throughout examination Lungs:  Normal respiratory effort, chest expands symmetrically. Lungs are clear to auscultation, no crackles or wheezes. Heart:  Normal rate and regular rhythm. S1 and S2 normal without gallop, murmur, click, rub or other extra sounds.   Impression & Recommendations:  Problem # 1:  HYPERLIPIDEMIA (ICD-272.4) Assessment Unchanged  His updated medication list for this problem includes:    Lipitor 10 Mg Tabs (Atorvastatin calcium) .Marland Kitchen... 1 by mouth once daily    Tricor 48 Mg Tabs (Fenofibrate) .Marland Kitchen... 1 qd  Problem # 2:  ESSENTIAL HYPERTENSION (ICD-401.9) Assessment: Improved  His updated medication list for this problem includes:    Bystolic 5 Mg Tabs (Nebivolol hcl) .Marland Kitchen... 2 by mouth once daily  Problem # 3:  ANXIETY DEPRESSION (ICD-300.4) Assessment: Unchanged  Orders: Psychiatric Referral (Psych)  Problem # 4:  TESTICULAR HYPOFUNCTION (ICD-257.2) Assessment: Deteriorated  Complete Medication List:  1)  Concerta 54 Mg Cr-tabs (Methylphenidate hcl) .Marland Kitchen.. 1 by mouth once daily 2)  Cymbalta 60 Mg Cpep (Duloxetine hcl) .Marland Kitchen.. 1 by mouth once daily 3)  Lipitor 10 Mg Tabs (Atorvastatin calcium) .Marland Kitchen.. 1 by mouth once daily 4)  Tricor  .... Pt would not tell strength 5)  Wellbutrin Xl 300 Mg Xr24h-tab (Bupropion hcl)  .Marland Kitchen.. 1 by mouth once daily 6)  Wellbutrin Xl 150 Mg Xr24h-tab (Bupropion hcl) .Marland Kitchen.. 1 by mouth once daily 7)  Viagra 100 Mg Tabs (Sildenafil citrate) 8)  Ambien 10 Mg Tabs (Zolpidem tartrate) 9)  Geodon 20 Mg Caps (Ziprasidone hcl) .... 2 capsules at hs 10)  Bystolic 5 Mg Tabs (Nebivolol hcl) .... 2 by mouth once daily 11)  Valium 5 Mg Tabs (Diazepam) .... Three times a day 12)  Tricor 48 Mg Tabs (Fenofibrate) .Marland Kitchen.. 1 qd  Other Orders: Venipuncture (56213) TLB-Testosterone, Total (84403-TESTO)  Patient Instructions: 1)  2 recheck testosterone leve will arrange [facility] Dr. Betti Cruz psychiatrist for your therapy and review all of your medication would like to consider referral to an endocrinologist or urologist for treatment of your hypogonadism we'll dysfunction 2)  We'll call lab results Prescriptions: TRICOR 48 MG TABS (FENOFIBRATE) 1 qd  #30 x 11   Entered and Authorized by:   Judithann Sheen MD   Signed by:   Judithann Sheen MD on 03/24/2009   Method used:   Print then Give to Patient   RxID:   [id number] VIAGRA 100 MG TABS (SILDENAFIL CITRATE)   #6 x 6   Entered and Authorized by:   Judithann Sheen MD   Signed by:   Judithann Sheen MD on 03/24/2009   Method used:   Print then Give to Patient   RxID:   [id number] [facility] XL 150 MG XR24H-TAB (BUPROPION HCL) 1 by mouth once daily  #30 x 2   Entered and Authorized by:   Judithann Sheen MD   Signed by:   Judithann Sheen MD on 03/24/2009   Method used:   Print then Give to Patient   RxID:   [id number] WELLBUTRIN XL 300 MG XR24H-TAB (BUPROPION HCL) 1 by mouth once daily  #30 x 2   Entered and Authorized by:   Judithann Sheen MD   Signed by:   Judithann Sheen MD on 03/24/2009   Method used:   Print then Give to Patient   RxID:   [id number]

## 2010-04-18 NOTE — Progress Notes (Signed)
Summary: rx concerta   Phone Note Call from Patient   Caller: Patient Summary of Call: wants dr stafford to refill concerta  Dr Dimple Casey out of office this week . Initial call taken by: Pura Spice, RN,  Aug 11, 2009 5:13 PM  Follow-up for Phone Call        ok x1 per dr staford Follow-up by: Pura Spice, RN,  Aug 11, 2009 5:13 PM    New/Updated Medications: CONCERTA 54 MG CR-TABS (METHYLPHENIDATE HCL) 1 by mouth once daily Prescriptions: CONCERTA 54 MG CR-TABS (METHYLPHENIDATE HCL) 1 by mouth once daily  #30 x 0   Entered by:   Pura Spice, RN   Authorized by:   Judithann Sheen MD   Signed by:   Pura Spice, RN on 08/11/2009   Method used:   Print then Give to Patient   RxID:   [id number]

## 2010-04-20 NOTE — Assessment & Plan Note (Signed)
Summary: consult for possible osa   Visit Type:  Initial Consult Copy to:  Danise Edge MD Primary Provider/Referring Provider:  Judithann Sheen MD  CC:  Sleep Consult. pt c/o snores loud and wakes up gasping for air. Marland Kitchen  History of Present Illness: The pt is a 54y/o male who I have been asked to see for possible OSA.  He has been noted to have loud snoring, as well as an abnormal breathing pattern during sleep.  He has also noted occasional choking arousal.  He goes to bed btw 10p-15mn, and arises at 7am to start his day.  He sometimes has frequent awakenings at night, and does not feel rested in the am's upon arising.  He feels that he is "tired all the time".  He works as an Airline pilot, and may catnap at his desk during the day.  He will also fall asleep watching tv or movies in the evening.  He denies any issues with driving.  His epworth score today is 15, and he tells me his weight is up 38pounds since april of this year.    Current Medications (verified): 1)  Concerta 54 Mg Cr-Tabs (Methylphenidate Hcl) .Marland Kitchen.. 1 By Mouth Once Daily 2)  Lipitor 10 Mg Tabs (Atorvastatin Calcium) .Marland Kitchen.. 1 By Mouth Once Daily 3)  Wellbutrin Xl 300 Mg Xr24h-Tab (Bupropion Hcl) .Marland Kitchen.. 1 By Mouth Once Daily 4)  Wellbutrin Xl 150 Mg Xr24h-Tab (Bupropion Hcl) .Marland Kitchen.. 1 By Mouth Once Daily 5)  Viagra 100 Mg Tabs (Sildenafil Citrate) 6)  Ambien 10 Mg Tabs (Zolpidem Tartrate) .... Take 1 By Mouth Every Night 7)  Bystolic 5 Mg Tabs (Nebivolol Hcl) .... 2 By Mouth Once Daily 8)  Valium 5 Mg Tabs (Diazepam) .... Three Times A Day 9)  Tricor 48 Mg Tabs (Fenofibrate) .Marland Kitchen.. 1 Qd 10)  Depo-Testosterone 200 Mg/ml Oil (Testosterone Cypionate) .Marland Kitchen.. 100 Mg (0.5 Cc), Every Week 11)  Clobetasol Propionate 0.05 % Soln (Clobetasol Propionate) .... Use To Scalp As Directed 12)  Cymbalta 30 Mg Cpep (Duloxetine Hcl) .... Take 3 Per Day 13)  Lasix 20 Mg Tabs (Furosemide) .Marland Kitchen.. 1 Tab By Mouth Daily As Needed Edema 14)  Naproxen 500 Mg  Tabs (Naproxen) .Marland Kitchen.. 1 Tab By Mouth Two Times A Day As Needed Pain With Food 15)  Syringe 22g X 1" 3 Ml Misc (Syringe/needle (Disp)) .Marland Kitchen.. 1 Every Week, and 23 G Needles For Injection 16)  Metronidazole 500 Mg Tabs (Metronidazole) .Marland Kitchen.. 1 Tablet Four Times A Day 17)  Mirapex 0.5 Mg Tabs (Pramipexole Dihydrochloride) .... Take 4 Tablets At Bedtime  Allergies (verified): No Known Drug Allergies  Past History:  Past Medical History: history hypogonadal function chronic anxiety depression testing for HIV  Hyperlipidemia Urnary track Infections HTN ?ptx in the past?  Past Surgical History: chest tube insertion?  Family History: Reviewed history from 01/27/2010 and no changes required. mother ovary cancer  grandparents/father elevated cholesterol heart diasase stroke high blood pressure.  Dr Jacklynn Barnacle [place]  Dr Loyal Gambler, [place]  no probs with testosterone father--emphysema  Social History: Reviewed history from 01/27/2010 and no changes required. 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 Alcohol Use - yes Regular Exercise - no  Review of Systems       The patient complains of shortness of breath with activity, productive cough, chest pain, irregular heartbeats, acid heartburn, weight change, anxiety, depression, hand/feet swelling, and joint stiffness or pain.  The patient denies shortness of breath  at rest, non-productive cough, coughing up blood, loss of appetite, abdominal pain, difficulty swallowing, sore throat, tooth/dental problems, headaches, nasal congestion/difficulty breathing through nose, sneezing, itching, ear ache, rash, change in color of mucus, and fever.    Vital Signs:  Patient profile:   55 year old male Height:      73 inches Weight:      260 pounds BMI:     34.43 O2 Sat:      96 % on Room air Temp:     97.5 degrees F oral Pulse rate:   69 / minute BP sitting:   134 / 84  (left arm) Cuff  size:   large  Vitals Entered By: Carver Fila (February 20, 2010 10:59 AM)  O2 Flow:  Room air CC: Sleep Consult. pt c/o snores loud, wakes up gasping for air.   Does patient need assistance? Functional Status Social activities Comments meds and allergies updated Phone number updated Carver Fila  February 20, 2010 11:09 AM    Physical Exam  General:  ow male in nad Eyes:  PERRLA and EOMI.   Nose:  narrowed bilat, but not obstructed Mouth:  small oral cavity with small posterior pharyngeal space.  Uvula and palate not overly abnormal Neck:  no jvd, tmg, LN Lungs:  totally clear to auscultation Heart:  rrr, no mrg Abdomen:  soft and nontender, bs+ Extremities:  mild ankle edema, no cyanosis  pulses intact distally Neurologic:  alert and oriented, moves all 4.   Impression & Recommendations:  Problem # 1:  OBSTRUCTIVE SLEEP APNEA (ICD-327.23) the pt's history is very suggestive of clinically significant sleep apnea.  He has loud snoring, abnormal breathing pattern noted, nonrestorative sleep, and EDS with periods of inactivity.  I have had a long discussion with the pt about sleep apnea, including its impact on QOL and CV health.  He will need a sleep study for diagnosis, and the pt is agreeable.  Medications Added to Medication List This Visit: 1)  Metronidazole 500 Mg Tabs (Metronidazole) .Marland Kitchen.. 1 tablet four times a day 2)  Mirapex 0.5 Mg Tabs (Pramipexole dihydrochloride) .... Take 4 tablets at bedtime  Other Orders: Consultation Level IV (16109) Sleep Disorder Referral (Sleep Disorder)  Patient Instructions: 1)  will set up for sleep study, and will arrange followup to discuss results once available. 2)  work on weight loss  Appended Document: consult for possible osa reviewed office visit to Dr. Deloris Ping

## 2010-04-20 NOTE — Assessment & Plan Note (Signed)
Summary: rov to discuss sleep study results.   Copy to:  Danise Edge MD Primary Provider/Referring Provider:  Judithann Sheen MD  CC:  pt here to discuss sleep study result.Marland Kitchen  History of Present Illness: The pt comes in today for f/u of his recent sleep study.  He was found to have moderate to severe osa with AHI of 37/hr.  I have reviewed his study in detail with him, and answered all of his questions.  Current Medications (verified): 1)  Concerta 54 Mg Cr-Tabs (Methylphenidate Hcl) .Marland Kitchen.. 1 By Mouth Once Daily 2)  Lipitor 10 Mg Tabs (Atorvastatin Calcium) .Marland Kitchen.. 1 By Mouth Once Daily 3)  Wellbutrin Xl 300 Mg Xr24h-Tab (Bupropion Hcl) .Marland Kitchen.. 1 By Mouth Once Daily 4)  Wellbutrin Xl 150 Mg Xr24h-Tab (Bupropion Hcl) .Marland Kitchen.. 1 By Mouth Once Daily 5)  Viagra 100 Mg Tabs (Sildenafil Citrate) 6)  Ambien 10 Mg Tabs (Zolpidem Tartrate) .... Take 1 By Mouth Every Night 7)  Bystolic 5 Mg Tabs (Nebivolol Hcl) .... 2 By Mouth Once Daily 8)  Valium 5 Mg Tabs (Diazepam) .... Three Times A Day 9)  Tricor 48 Mg Tabs (Fenofibrate) .Marland Kitchen.. 1 Qd 10)  Depo-Testosterone 200 Mg/ml Oil (Testosterone Cypionate) .Marland Kitchen.. 100 Mg (0.5 Cc), Every Week 11)  Clobetasol Propionate 0.05 % Soln (Clobetasol Propionate) .... Use To Scalp As Directed 12)  Cymbalta 30 Mg Cpep (Duloxetine Hcl) .... Take 3 Per Day 13)  Lasix 20 Mg Tabs (Furosemide) .Marland Kitchen.. 1 Tab By Mouth Daily As Needed Edema 14)  Naproxen 500 Mg Tabs (Naproxen) .Marland Kitchen.. 1 Tab By Mouth Two Times A Day As Needed Pain With Food 15)  Syringe 22g X 1" 3 Ml Misc (Syringe/needle (Disp)) .Marland Kitchen.. 1 Every Week, and 23 G Needles For Injection 16)  Metronidazole 500 Mg Tabs (Metronidazole) .Marland Kitchen.. 1 Tablet Four Times A Day 17)  Mirapex 0.5 Mg Tabs (Pramipexole Dihydrochloride) .... Take 4 Tablets At Bedtime  Allergies (verified): No Known Drug Allergies  Review of Systems       The patient complains of shortness of breath with activity, chest pain, anxiety, depression, hand/feet  swelling, and joint stiffness or pain.  The patient denies shortness of breath at rest, productive cough, non-productive cough, coughing up blood, irregular heartbeats, acid heartburn, indigestion, loss of appetite, weight change, abdominal pain, difficulty swallowing, sore throat, tooth/dental problems, headaches, nasal congestion/difficulty breathing through nose, sneezing, itching, ear ache, rash, change in color of mucus, and fever.    Vital Signs:  Patient profile:   55 year old male Height:      73 inches Weight:      261.50 pounds BMI:     34.63 O2 Sat:      96 % on Room air Temp:     97.8 degrees F oral Pulse rate:   75 / minute BP sitting:   128 / 78  (left arm) Cuff size:   large  Vitals Entered By: Carver Fila (March 08, 2010 12:02 PM)  O2 Flow:  Room air CC: pt here to discuss sleep study result.   Physical Exam  General:  ow male in nad Extremities:  no edema or cyanosis  Neurologic:  appears very sleepy, moves all 4.   Impression & Recommendations:  Problem # 1:  OBSTRUCTIVE SLEEP APNEA (ICD-327.23) The pt has significant sleep apnea as documented by his sleep study.  I have reviewed the various treatments for this, but feel cpap while working on weight loss would give him the best chance  of improvement.  He is willing to give this a try.  I will set the patient up on cpap at a moderate pressure level to allow for desensitization, and will troubleshoot the device over the next 4-6weeks if needed.  The pt is to call me if having issues with tolerance.  Will then optimize the pressure once patient is able to wear cpap on a consistent basis.  Other Orders: Est. Patient Level III (04540) DME Referral (DME)  Patient Instructions: 1)  will set up on cpap at moderate pressure level.  Please call if having issues with tolerance. 2)  work on weight loss 3)  followup with me in 5 weeks.

## 2010-04-20 NOTE — Progress Notes (Signed)
  Phone Note From Pharmacy   Caller: [facility]  [place]. #[phone]* Call For: [address]  Details for Reason: refill Summary of Call: refill zolpidem tartrate 10 mg. last refill date 03/14/2010 Initial call taken by: Kyung Rudd, CMA,  April 07, 2010 12:31 PM  Follow-up for Phone Call        ok Remus Loffler #[id number] to replace lunesta Follow-up by: Edwyna Perfect MD,  April 07, 2010 12:41 PM    New/Updated Medications: AMBIEN 10 MG TABS (ZOLPIDEM TARTRATE) 1 at bedtime Prescriptions: AMBIEN 10 MG TABS (ZOLPIDEM TARTRATE) 1 at bedtime  #30 x 3   Entered by:   Kyung Rudd, CMA   Authorized by:   Edwyna Perfect MD   Signed by:   Kyung Rudd, CMA on 04/07/2010   Method used:   Telephoned to ...       [facility]  [place]. [phone]* (retail)       [address]       [place], [place]  [address]       Ph: [phone]       Fax: [phone]   RxID:   [id number]

## 2010-04-20 NOTE — Assessment & Plan Note (Signed)
Summary: trouble swallowing//ccm   Vital Signs:  Patient profile:   55 year old male Weight:      262 pounds Pulse rate:   70 / minute BP sitting:   110 / 70  (left arm)  Vitals Entered By: Kyung Rudd, CMA (March 16, 2010 8:16 AM) CC: ?strep   Primary Care Provider:  Judithann Sheen MD  CC:  ?strep.  History of Present Illness: Patient presents to clinic as a workin for evaluation of difficulty swallowing. States 5d hx of throat pain and feels like is "closing up." +mild difficulty swallowing without odynophagia. States symptoms recently improved after taking two doses of amoxicillin he had at home. Also complains of insomnia. Typically takes [place] but does not provide adequate sleep. No other complaints.  Current Medications (verified): 1)  Concerta 54 Mg Cr-Tabs (Methylphenidate Hcl) .Marland Kitchen.. 1 By Mouth Once Daily 2)  Lipitor 10 Mg Tabs (Atorvastatin Calcium) .Marland Kitchen.. 1 By Mouth Once Daily 3)  Wellbutrin Xl 300 Mg Xr24h-Tab (Bupropion Hcl) .Marland Kitchen.. 1 By Mouth Once Daily 4)  Wellbutrin Xl 150 Mg Xr24h-Tab (Bupropion Hcl) .Marland Kitchen.. 1 By Mouth Once Daily 5)  Viagra 100 Mg Tabs (Sildenafil Citrate) 6)  Ambien 10 Mg Tabs (Zolpidem Tartrate) .... Take 1 By Mouth Every Night 7)  Bystolic 5 Mg Tabs (Nebivolol Hcl) .... 2 By Mouth Once Daily 8)  Valium 5 Mg Tabs (Diazepam) .... Three Times A Day 9)  Tricor 48 Mg Tabs (Fenofibrate) .Marland Kitchen.. 1 Qd 10)  Depo-Testosterone 200 Mg/ml Oil (Testosterone Cypionate) .Marland Kitchen.. 100 Mg (0.5 Cc), Every Week 11)  Clobetasol Propionate 0.05 % Soln (Clobetasol Propionate) .... Use To Scalp As Directed 12)  Cymbalta 30 Mg Cpep (Duloxetine Hcl) .... Take 3 Per Day 13)  Lasix 20 Mg Tabs (Furosemide) .Marland Kitchen.. 1 Tab By Mouth Daily As Needed Edema 14)  Naproxen 500 Mg Tabs (Naproxen) .Marland Kitchen.. 1 Tab By Mouth Two Times A Day As Needed Pain With Food 15)  Syringe 22g X 1" 3 Ml Misc (Syringe/needle (Disp)) .Marland Kitchen.. 1 Every Week, and 23 G Needles For Injection 16)  Metronidazole 500 Mg  Tabs (Metronidazole) .Marland Kitchen.. 1 Tablet Four Times A Day 17)  Mirapex 0.5 Mg Tabs (Pramipexole Dihydrochloride) .... Take 4 Tablets At Bedtime  Allergies (verified): No Known Drug Allergies  Past History:  Past medical, surgical, family and social histories (including risk factors) reviewed, and no changes noted (except as noted below).  Past Medical History: Reviewed history from 02/20/2010 and no changes required. history hypogonadal function chronic anxiety depression testing for HIV  Hyperlipidemia Urnary track Infections HTN ?ptx in the past?  Past Surgical History: Reviewed history from 02/20/2010 and no changes required. chest tube insertion?  Family History: Reviewed history from 02/20/2010 and no changes required. mother ovary cancer  grandparents/father elevated cholesterol heart diasase stroke high blood pressure.  Dr Jacklynn Barnacle [place]  Dr Loyal Gambler, [place]  no probs with testosterone father--emphysema  Social History: Reviewed history from 02/20/2010 and no changes required. 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 Alcohol Use - yes Regular Exercise - no  Review of Systems      See HPI General:  Denies chills, fatigue, fever, and sweats. Eyes:  Denies discharge, eye irritation, and eye pain. ENT:  Complains of difficulty swallowing, postnasal drainage, and sore throat; denies ear discharge, earache, hoarseness, nasal congestion, and sinus pressure. Resp:  Denies cough, shortness of breath, sputum productive, and wheezing.  Physical Exam  General:  Well-developed,well-nourished,in no acute distress; alert,appropriate and cooperative throughout examination Head:  Normocephalic and atraumatic without obvious abnormalities. No apparent alopecia or balding. Eyes:  pupils equal, pupils round, corneas and lenses clear, and no injection.   Ears:  External ear exam shows no significant lesions or  deformities.  Otoscopic examination reveals clear canals, tympanic membranes are intact bilaterally without bulging, retraction, inflammation or discharge. Hearing is grossly normal bilaterally. Nose:  External nasal examination shows no deformity or inflammation. Nasal mucosa are pink and moist without lesions or exudates. Mouth:  Posterior op erythema and possible right sided exudate.good dentition, no gingival abnormalities, no postnasal drip, and no tongue abnormalities.   Skin:  turgor normal, color normal, and no rashes.   Cervical Nodes:  No lymphadenopathy noted Psych:  Oriented X3, normally interactive, good eye contact, and not anxious appearing.     Impression & Recommendations:  Problem # 1:  ACUTE PHARYNGITIS (ICD-462) Assessment New Begin abx tx. Followup if no improvement or worsening.  His updated medication list for this problem includes:    Amoxicillin 500 Mg Caps (Amoxicillin) ..... One by mouth bid  Problem # 2:  INSOMNIA (ICD-780.52) Assessment: Deteriorated Failing ambien. Attempt lunesta.   His updated medication list for this problem includes:    Lunesta 2 Mg Tabs (Eszopiclone) ..... One by mouth at bedtime as needed insomnia  Complete Medication List: 1)  Concerta 54 Mg Cr-tabs (Methylphenidate hcl) .Marland Kitchen.. 1 by mouth once daily 2)  Lipitor 10 Mg Tabs (Atorvastatin calcium) .Marland Kitchen.. 1 by mouth once daily 3)  Wellbutrin Xl 300 Mg Xr24h-tab (Bupropion hcl) .Marland Kitchen.. 1 by mouth once daily 4)  Wellbutrin Xl 150 Mg Xr24h-tab (Bupropion hcl) .Marland Kitchen.. 1 by mouth once daily 5)  Viagra 100 Mg Tabs (Sildenafil citrate) 6)  Lunesta 2 Mg Tabs (Eszopiclone) .... One by mouth at bedtime as needed insomnia 7)  Bystolic 5 Mg Tabs (Nebivolol hcl) .... 2 by mouth once daily 8)  Valium 5 Mg Tabs (Diazepam) .... Three times a day 9)  Tricor 48 Mg Tabs (Fenofibrate) .Marland Kitchen.. 1 qd 10)  Depo-testosterone 200 Mg/ml Oil (Testosterone cypionate) .Marland Kitchen.. 100 mg (0.5 cc), every week 11)  Clobetasol  Propionate 0.05 % Soln (Clobetasol propionate) .... Use to scalp as directed 12)  Cymbalta 30 Mg Cpep (Duloxetine hcl) .... Take 3 per day 13)  Lasix 20 Mg Tabs (Furosemide) .Marland Kitchen.. 1 tab by mouth daily as needed edema 14)  Naproxen 500 Mg Tabs (Naproxen) .Marland Kitchen.. 1 tab by mouth two times a day as needed pain with food 15)  Syringe 22g X 1" 3 Ml Misc (Syringe/needle (disp)) .Marland Kitchen.. 1 every week, and 23 g needles for injection 16)  Metronidazole 500 Mg Tabs (Metronidazole) .Marland Kitchen.. 1 tablet four times a day 17)  Mirapex 0.5 Mg Tabs (Pramipexole dihydrochloride) .... Take 4 tablets at bedtime 18)  Amoxicillin 500 Mg Caps (Amoxicillin) .... One by mouth bid Prescriptions: AMOXICILLIN 500 MG CAPS (AMOXICILLIN) one by mouth bid  #15 x 0   Entered and Authorized by:   Edwyna Perfect MD   Signed by:   Edwyna Perfect MD on 03/19/2010   Method used:   Historical   RxID:   [id number] NAPROXEN 500 MG TABS (NAPROXEN) 1 tab by mouth two times a day as needed pain with food  #60 x 1   Entered and Authorized by:   Edwyna Perfect MD   Signed by:   Edwyna Perfect MD on 03/16/2010   Method used:   Electronically  to        [facility]. [phone]* (retail)       [address]       [place], [place]  [address]       Ph: [phone]       Fax: [phone]   RxID:   [id number] WELLBUTRIN XL 300 MG XR24H-TAB (BUPROPION HCL) 1 by mouth once daily  #30 x 3   Entered and Authorized by:   Edwyna Perfect MD   Signed by:   Edwyna Perfect MD on 03/16/2010   Method used:   Electronically to        [facility]. [phone]* (retail)       [address]       [place], [place]  [address]       Ph: [phone]       Fax: [phone]   RxID:   [id number] LUNESTA 2 MG TABS (ESZOPICLONE) one by mouth at bedtime as needed insomnia  #30 x 1   Entered and Authorized by:   Edwyna Perfect MD   Signed by:   Edwyna Perfect MD on 03/16/2010   Method used:    Print then Give to Patient   RxID:   [id number]    Orders Added: 1)  Est. Patient Level IV [76283]

## 2010-04-21 NOTE — Letter (Signed)
Summary: Records from [facility]  Records from [facility]   Imported By: Maryln Gottron 09/23/2009 13:10:04  _____________________________________________________________________  External Attachment:    Type:   Image     Comment:   External Document

## 2010-04-21 NOTE — Medication Information (Signed)
Summary: Prior Authorization Request for Viagra  Prior Authorization Request for Viagra   Imported By: Maryln Gottron 07/15/2009 10:49:42  _____________________________________________________________________  External Attachment:    Type:   Image     Comment:   External Document

## 2010-04-21 NOTE — Letter (Signed)
Summary: USPS return receipt  USPS return receipt   Imported By: Lenard Forth 03/10/2010 11:57:32  _____________________________________________________________________  External Attachment:    Type:   Image     Comment:   External Document

## 2010-05-03 ENCOUNTER — Encounter: Payer: Self-pay | Admitting: Pulmonary Disease

## 2010-05-10 ENCOUNTER — Other Ambulatory Visit: Payer: Self-pay | Admitting: Family Medicine

## 2010-05-16 NOTE — Letter (Signed)
Summary: CMN Order / Choice  CMN Order / Choice   Imported By: Lennie Odor 05/12/2010 11:56:04  _____________________________________________________________________  External Attachment:    Type:   Image     Comment:   External Document

## 2010-06-02 ENCOUNTER — Encounter: Payer: Self-pay | Admitting: Family Medicine

## 2010-06-05 ENCOUNTER — Encounter: Payer: Self-pay | Admitting: Internal Medicine

## 2010-06-05 ENCOUNTER — Ambulatory Visit (INDEPENDENT_AMBULATORY_CARE_PROVIDER_SITE_OTHER): Payer: BC Managed Care – PPO | Admitting: Internal Medicine

## 2010-06-05 DIAGNOSIS — E291 Testicular hypofunction: Secondary | ICD-10-CM

## 2010-06-05 DIAGNOSIS — F341 Dysthymic disorder: Secondary | ICD-10-CM

## 2010-06-05 DIAGNOSIS — L089 Local infection of the skin and subcutaneous tissue, unspecified: Secondary | ICD-10-CM

## 2010-06-05 MED ORDER — BUPROPION HCL ER (XL) 150 MG PO TB24: 150.0000 mg | ORAL_TABLET | Freq: Every day | ORAL | Status: DC

## 2010-06-05 MED ORDER — DIAZEPAM 5 MG PO TABS: 5.0000 mg | ORAL_TABLET | Freq: Three times a day (TID) | ORAL | Status: DC | PRN

## 2010-06-06 ENCOUNTER — Telehealth: Payer: Self-pay | Admitting: Endocrinology

## 2010-06-06 NOTE — Telephone Encounter (Signed)
Per MD, pt is due for F/U Visit, next available Transferred to scheduler to schedule appt. Appointment scheduled 06/08/2010

## 2010-06-07 ENCOUNTER — Encounter: Payer: Self-pay | Admitting: Endocrinology

## 2010-06-08 ENCOUNTER — Ambulatory Visit (INDEPENDENT_AMBULATORY_CARE_PROVIDER_SITE_OTHER): Payer: BC Managed Care – PPO | Admitting: Endocrinology

## 2010-06-08 ENCOUNTER — Encounter: Payer: Self-pay | Admitting: Endocrinology

## 2010-06-08 VITALS — BP 118/68 | HR 78 | Temp 98.7°F | Ht 73.0 in | Wt 272.2 lb

## 2010-06-08 DIAGNOSIS — L089 Local infection of the skin and subcutaneous tissue, unspecified: Secondary | ICD-10-CM

## 2010-06-08 MED ORDER — DOXYCYCLINE HYCLATE 100 MG PO TABS: 100.0000 mg | ORAL_TABLET | Freq: Two times a day (BID) | ORAL | Status: DC

## 2010-06-08 NOTE — Progress Notes (Signed)
Subjective:    Patient ID: Daniel [name], male    DOB: [birth year], 55 y.o.   MRN: [id number]  HPI Pt says he continues to have sxs of severe fatigue.  He owns his own business.  He says sxs are so severe, he cannot work a full day.  He reports poor libido.  He says he is taking double the prescribed dosage of testosterone, and has run out.   Pt reports few days of moderately painful pustule at the left buttock, and assoc drainage.  He says this is not the site of a testosterone injection, and that he has had these infections in the past.   Past Medical History  Diagnosis Date  . Depression   . Anxiety   . Hyperlipidemia   . Hypertension    No past surgical history on file.  reports that he has been smoking.  He does not have any smokeless tobacco history on file. He reports that he drinks alcohol. His drug history not on file. family history includes Cancer in his mother; Emphysema in his father; Heart disease in his father; Hyperlipidemia in his father; Hypertension in his father; and Stroke in his father. No Known Allergies  Review of Systems He denies weak urinary stream.  No fever    Objective:   Physical Exam Left buttock:  There is a draining pustule, 3 cm diameter Genitalia: normal Hands:  Slightly swollen Ext: trace leg edema     Assessment & Plan:  Hypogonadism.  He is exceeding prescribed dosage of testosterone. Pustule, new problem to [place].

## 2010-06-08 NOTE — Patient Instructions (Addendum)
i have sent a prescription for an antibiotic to your pharmacy. Keep the infected area as clean as you can. When your next refill of testosterone is due, start 100 mg every week.  Then go to the lab 5-7 days after the 4th dose, for a testosterone level.

## 2010-06-23 ENCOUNTER — Encounter: Payer: Self-pay | Admitting: Internal Medicine

## 2010-06-23 NOTE — Progress Notes (Signed)
Subjective:    Patient ID: Daniel [name], male    DOB: 1955/08/15, 55 y.o.   MRN: [id number]  HPI Pt presents to clinic for evaluation of hypogonadism, anxiety and depression. Depression and anxiety under stable control without exacerbation. Requests refill of valium and wellbutrin. H/o hypogonadism previously followed by PMD now followed by endocrinology. C/o wt gain and fatigue. Notes low testosterone value on last two labs.    Review of Systems  Constitutional: Positive for fatigue and unexpected weight change. Negative for fever and chills.  HENT: Negative for facial swelling and ear discharge.   Eyes: Negative for pain, discharge and redness.  Psychiatric/Behavioral: Negative for behavioral problems and agitation. The patient is not nervous/anxious.        Objective:   Physical Exam  Vitals reviewed. Constitutional: He appears well-developed and well-nourished.  HENT:  Head: Normocephalic and atraumatic.  Right Ear: External ear normal.  Left Ear: External ear normal.  Eyes: Conjunctivae are normal.  Neurological: He is alert.  Skin: Skin is warm and dry.  Psychiatric: He has a normal mood and affect.          Assessment & Plan:

## 2010-06-23 NOTE — Assessment & Plan Note (Signed)
Continue current regimen. Will discuss with Dr. Everardo All and consider f/u  appt with him.

## 2010-06-23 NOTE — Assessment & Plan Note (Signed)
Stable. Rf valium and wellbutrin.

## 2010-06-29 ENCOUNTER — Encounter: Payer: Self-pay | Admitting: Pulmonary Disease

## 2010-07-10 ENCOUNTER — Encounter: Payer: Self-pay | Admitting: Internal Medicine

## 2010-07-10 ENCOUNTER — Ambulatory Visit (INDEPENDENT_AMBULATORY_CARE_PROVIDER_SITE_OTHER): Payer: BC Managed Care – PPO | Admitting: Internal Medicine

## 2010-07-10 VITALS — BP 134/90 | HR 105 | Wt 282.0 lb

## 2010-07-10 DIAGNOSIS — M25519 Pain in unspecified shoulder: Secondary | ICD-10-CM

## 2010-07-10 DIAGNOSIS — M25569 Pain in unspecified knee: Secondary | ICD-10-CM

## 2010-07-10 DIAGNOSIS — E291 Testicular hypofunction: Secondary | ICD-10-CM

## 2010-07-10 DIAGNOSIS — M545 Low back pain, unspecified: Secondary | ICD-10-CM

## 2010-07-10 DIAGNOSIS — M25512 Pain in left shoulder: Secondary | ICD-10-CM

## 2010-07-10 DIAGNOSIS — F341 Dysthymic disorder: Secondary | ICD-10-CM

## 2010-07-10 MED ORDER — METHYLPHENIDATE HCL ER (OSM) 54 MG PO TBCR: 54.0000 mg | EXTENDED_RELEASE_TABLET | ORAL | Status: DC

## 2010-07-10 MED ORDER — DICLOFENAC SODIUM 75 MG PO TBEC: DELAYED_RELEASE_TABLET | ORAL | Status: AC

## 2010-07-13 ENCOUNTER — Encounter: Payer: Self-pay | Admitting: Endocrinology

## 2010-07-13 ENCOUNTER — Ambulatory Visit (INDEPENDENT_AMBULATORY_CARE_PROVIDER_SITE_OTHER): Payer: BC Managed Care – PPO | Admitting: Endocrinology

## 2010-07-13 DIAGNOSIS — Z125 Encounter for screening for malignant neoplasm of prostate: Secondary | ICD-10-CM | POA: Insufficient documentation

## 2010-07-13 DIAGNOSIS — E291 Testicular hypofunction: Secondary | ICD-10-CM

## 2010-07-13 NOTE — Progress Notes (Signed)
Subjective:    Patient ID: Daniel [name], male    DOB: [birth year], 55 y.o.   MRN: [id number]  HPI Pt says he has ongoing fatigue and decreased libido.  He says a friend administers the testosterone injections to him.  He says viagra "helps blood flow, but does not improve interest." Past Medical History  Diagnosis Date  . Depression   . Anxiety   . Hyperlipidemia   . Hypertension    No past surgical history on file.  reports that he has been smoking.  He does not have any smokeless tobacco history on file. He reports that he drinks alcohol. His drug history not on file. family history includes Cancer in his mother; Emphysema in his father; Heart disease in his father; Hyperlipidemia in his father; Hypertension in his father; and Stroke in his father. No Known Allergies  Review of Systems Denies decreased urinary stream    Objective:   Physical Exam GENERAL: no distress GENITALIA: Normal male testicles, scrotum, and penis     Lab Results  Component Value Date   TESTOSTERONE 271.77* 07/11/2010  (pt says this was drawn 11 days after his last injection).    Assessment & Plan:  Hypogonadism.  i need a testosterone level < 7 days after injection in order to interpret.

## 2010-07-13 NOTE — Patient Instructions (Addendum)
Please continue the same dosage of testosterone. After 4 weeks of taking it weekly, go back to the lab prior to the 5th weekly shot.  Then please call [phone] to hear your test results. Please make a follow-up appointment in 6 months

## 2010-07-16 ENCOUNTER — Encounter: Payer: Self-pay | Admitting: Internal Medicine

## 2010-07-16 DIAGNOSIS — M25569 Pain in unspecified knee: Secondary | ICD-10-CM | POA: Insufficient documentation

## 2010-07-16 DIAGNOSIS — M25512 Pain in left shoulder: Secondary | ICD-10-CM | POA: Insufficient documentation

## 2010-07-16 DIAGNOSIS — M545 Low back pain, unspecified: Secondary | ICD-10-CM | POA: Insufficient documentation

## 2010-07-16 NOTE — Assessment & Plan Note (Signed)
Obtain early morning testosterone in preparation for endocrinology followup

## 2010-07-16 NOTE — Assessment & Plan Note (Signed)
Stable. Refill Concerta x1 until psychiatry appointment

## 2010-07-16 NOTE — Assessment & Plan Note (Signed)
No radicular symptoms.Anti-inflammatory as above.Followup if no improvement or worsening.

## 2010-07-16 NOTE — Assessment & Plan Note (Signed)
Normal exam. Attempt anti-inflammatory. Take with food and no other anti-inflammatories.Followup if no improvement or worsening.

## 2010-07-16 NOTE — Progress Notes (Signed)
Subjective:    Patient ID: Daniel [name], male    DOB: 06-May-1955, 55 y.o.   MRN: [id number]  HPI Patient presents to clinic for evaluation of multiple medical problems. Is following up with endocrinology regarding hypogonadism in several days and requests testosterone level be drawn prior to appointment. See psychiatry who typically refills Concerta but is nearly out of medication and requests refill until he can be seen by psychiatry. Complains of chronic lower kidney stiffness worse with standing up. Has pain in right knee with torsion without history of injury or trauma. No instability. Complains ofleft posterior shoulder pain worse with abduction. No injury or trauma. Has left low back pain and history of possible L4 trauma remotely. No radicular symptoms numbness tingling or weakness. No alleviating or exacerbating factors.No other complaints.  Reviewed past medical history, medications and allergies    Review of Systems see history of present illness     Objective:   Physical Exam    Physical Exam  Vitals reviewed. Constitutional:  appears well-developed and well-nourished. No distress.  HENT:  Head: Normocephalic and atraumatic.  Right Ear: Tympanic membrane, external ear and ear canal normal.  Left Ear: Tympanic membrane, external ear and ear canal normal.  Nose: Nose normal.  Mouth/Throat: Oropharynx is clear and moist. No oropharyngeal exudate.  Eyes: Conjunctivae and EOM are normal. Pupils are equal, round, and reactive to light. Right eye exhibits no discharge. Left eye exhibits no discharge. No scleral icterus.  Neck: Neck supple. No thyromegaly present.  Cardiovascular: Normal rate, regular rhythm and normal heart sounds.  Exam reveals no gallop and no friction rub.   No murmur heard. Pulmonary/Chest: Effort normal and breath sounds normal. No respiratory distress.  has no wheezes.  has no rales.  Lymphadenopathy:   no cervical adenopathy.  Neurological:  is alert.    Skin: Skin is warm and dry.  not diaphoretic.  Psychiatric: normal mood and affect.  Musculoskeletal: Examination right knee reveals no erythema warmth or effusion. Nontender. Anterior drawer sign negative. Examination left shoulder reveals full range of motion limited by pain on abduction. No crepitus. Nontender. Lumbar back without obvious paraspinal muscle spasm.Gait normal.    Assessment & Plan:

## 2010-07-19 ENCOUNTER — Other Ambulatory Visit: Payer: Self-pay | Admitting: Family Medicine

## 2010-08-07 ENCOUNTER — Other Ambulatory Visit: Payer: Self-pay | Admitting: Internal Medicine

## 2010-08-09 ENCOUNTER — Other Ambulatory Visit: Payer: Self-pay | Admitting: Endocrinology

## 2010-08-10 ENCOUNTER — Telehealth: Payer: Self-pay | Admitting: Family Medicine

## 2010-08-10 ENCOUNTER — Ambulatory Visit (INDEPENDENT_AMBULATORY_CARE_PROVIDER_SITE_OTHER): Payer: BC Managed Care – PPO | Admitting: Internal Medicine

## 2010-08-10 ENCOUNTER — Encounter: Payer: Self-pay | Admitting: Internal Medicine

## 2010-08-10 DIAGNOSIS — G4700 Insomnia, unspecified: Secondary | ICD-10-CM

## 2010-08-10 DIAGNOSIS — L0291 Cutaneous abscess, unspecified: Secondary | ICD-10-CM

## 2010-08-10 DIAGNOSIS — M545 Low back pain, unspecified: Secondary | ICD-10-CM

## 2010-08-10 MED ORDER — ZOLPIDEM TARTRATE 10 MG PO TABS: 10.0000 mg | ORAL_TABLET | Freq: Every evening | ORAL | Status: DC | PRN

## 2010-08-10 MED ORDER — SULFAMETHOXAZOLE-TRIMETHOPRIM 800-160 MG PO TABS: 1.0000 | ORAL_TABLET | Freq: Two times a day (BID) | ORAL | Status: AC

## 2010-08-10 NOTE — Telephone Encounter (Signed)
Pt is req to change pcps from Dr Scotty Court to Dr Rodena Medin, due to availabality issues. Pls adivse.

## 2010-08-10 NOTE — Telephone Encounter (Signed)
Ok per Dr. Scotty Court for pt to switch PCP to [place].  Pt stated that he needs an physical appointment set up for next week and was told this would be scheduled.  Pls call pt back to schedule

## 2010-08-17 ENCOUNTER — Encounter: Payer: Self-pay | Admitting: Internal Medicine

## 2010-08-17 ENCOUNTER — Ambulatory Visit (INDEPENDENT_AMBULATORY_CARE_PROVIDER_SITE_OTHER): Payer: BC Managed Care – PPO | Admitting: Internal Medicine

## 2010-08-17 DIAGNOSIS — N481 Balanitis: Secondary | ICD-10-CM

## 2010-08-17 DIAGNOSIS — L089 Local infection of the skin and subcutaneous tissue, unspecified: Secondary | ICD-10-CM | POA: Insufficient documentation

## 2010-08-17 DIAGNOSIS — N476 Balanoposthitis: Secondary | ICD-10-CM

## 2010-08-17 MED ORDER — DULOXETINE HCL 30 MG PO CPEP: ORAL_CAPSULE | ORAL | Status: DC

## 2010-08-17 MED ORDER — RIFAMPIN 300 MG PO CAPS: 300.0000 mg | ORAL_CAPSULE | Freq: Two times a day (BID) | ORAL | Status: DC

## 2010-08-17 MED ORDER — CLOTRIMAZOLE 1 % EX CREA: TOPICAL_CREAM | Freq: Two times a day (BID) | CUTANEOUS | Status: DC

## 2010-08-17 NOTE — Progress Notes (Signed)
Subjective:    Patient ID: Daniel [name], male    DOB: 04-28-1955, 55 y.o.   MRN: [id number]  HPI Pt presents to clinic for evaluation of scrotal infection. Recently seen several days ago with two erythematous papules located in the lower scrotum. Patient notes worsening of size and pain. No spontaneous drainage. Denies fever or chills. 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 Currently tolerating Septra without adverse effect. Also appears to have recent onset of discharge on the penile head. No urethral discharge or pain. Has attempted no medication for the problem. No alleviating or exacerbating factors. No other complaints.  Reviewed past medical history, medications and allergies.    Review of Systems see history of present illness     Objective:   Physical Exam  [nursing notereviewed. Constitutional: He appears well-developed and well-nourished. No distress.  HENT:  Head: Normocephalic and atraumatic.  Nose: Nose normal.  Neurological: He is alert.  Skin: Skin is warm and dry. Rash noted. He is not diaphoretic. No erythema. No pallor.       Scrotal exam reveals two erythematous papules nontender and nondraining. Largest papule is slightly increased in size from previous exam. No urethral discharge. Retraction of the foreskin reveals on the dorsal aspect of the penis small amount of white to yellow drainage. No penile sores or wounds noted  Psychiatric: He has a normal mood and affect.          Assessment & Plan:

## 2010-08-17 NOTE — Assessment & Plan Note (Signed)
Stop Septra. Five day course of rifampin as has previously responded well.Followup if no improvement or worsening.

## 2010-08-17 NOTE — Assessment & Plan Note (Signed)
Attempt Mycelex to affected area.Followup if no improvement or worsening.

## 2010-08-20 DIAGNOSIS — L0291 Cutaneous abscess, unspecified: Secondary | ICD-10-CM | POA: Insufficient documentation

## 2010-08-20 NOTE — Assessment & Plan Note (Signed)
Failed Voltaren. Discussed and recommended physical therapy. Patient states he will consider

## 2010-08-20 NOTE — Assessment & Plan Note (Signed)
Stable. Refill Ambien.

## 2010-08-20 NOTE — Assessment & Plan Note (Signed)
Begin course of Septra.Followup if no improvement or worsening.

## 2010-08-20 NOTE — Progress Notes (Signed)
Subjective:    Patient ID: Daniel [name], male    DOB: [birth year], 55 y.o.   MRN: [id number]  HPI Patient presents to clinic for evaluation of possible scrotal infection. Patient notes several day history of raised areas x2 in the posterior aspect of the scrotum. There has been no drainage from the areas are not increasing in size or painful/tender. Denies fever chills or urethral discharge. Does suffer from chronic insomnia maintained on Ambien without adverse effect. Request refill of the medication. Previous arm pain has resolved. Does have chronic low back pain without radicular symptoms status post anti-inflammatory course. Notes no improvement with Voltaren. No exacerbating or alleviating factors. No other complaints.  Reviewed past medical history, medications and allergies    Review of Systems see history of present illness     Objective:   Physical Exam  [nursing notereviewed. Constitutional: He appears well-developed and well-nourished.  HENT:  Head: Normocephalic and atraumatic.  Right Ear: External ear normal.  Left Ear: External ear normal.  Nose: Nose normal.  Eyes: Conjunctivae are normal. No scleral icterus.  Genitourinary: Testes normal and penis normal. No discharge found.  Neurological: He is alert.  Skin: Skin is warm and dry. No rash noted. There is erythema. No pallor.       Posterior aspect of scrotum reveals 2 small erythematous papules approximately 5 mm in transverse diameter. Nontender and without drainage.  Psychiatric: He has a normal mood and affect.          Assessment & Plan:

## 2010-08-22 ENCOUNTER — Telehealth: Payer: Self-pay | Admitting: Internal Medicine

## 2010-08-22 MED ORDER — METHYLPHENIDATE HCL ER (OSM) 54 MG PO TBCR: 54.0000 mg | EXTENDED_RELEASE_TABLET | ORAL | Status: DC

## 2010-08-22 MED ORDER — DICLOFENAC SODIUM 75 MG PO TBEC: 75.0000 mg | DELAYED_RELEASE_TABLET | Freq: Two times a day (BID) | ORAL | Status: DC | PRN

## 2010-08-22 NOTE — Telephone Encounter (Signed)
Addended by: Beverely Low on: 08/22/2010 04:37 PM   Modules accepted: Orders

## 2010-08-22 NOTE — Telephone Encounter (Signed)
Patient called and is requesting a refill for Methylphendidate ER and Diclofen AC.  He uses [facility] on [address].

## 2010-08-22 NOTE — Telephone Encounter (Signed)
Pt notified rx ready to pick up.

## 2010-08-28 ENCOUNTER — Other Ambulatory Visit: Payer: BC Managed Care – PPO

## 2010-09-04 ENCOUNTER — Encounter: Payer: BC Managed Care – PPO | Admitting: Internal Medicine

## 2010-09-05 ENCOUNTER — Other Ambulatory Visit: Payer: Self-pay | Admitting: Family Medicine

## 2010-09-06 ENCOUNTER — Other Ambulatory Visit (INDEPENDENT_AMBULATORY_CARE_PROVIDER_SITE_OTHER): Payer: BC Managed Care – PPO

## 2010-09-06 DIAGNOSIS — Z0000 Encounter for general adult medical examination without abnormal findings: Secondary | ICD-10-CM

## 2010-09-06 DIAGNOSIS — Z125 Encounter for screening for malignant neoplasm of prostate: Secondary | ICD-10-CM

## 2010-09-06 DIAGNOSIS — E291 Testicular hypofunction: Secondary | ICD-10-CM

## 2010-09-06 LAB — LIPID PANEL
Cholesterol: 206 mg/dL — ABNORMAL HIGH (ref 0–200)
HDL: 33.3 mg/dL — ABNORMAL LOW (ref >39.00)
Total CHOL/HDL Ratio: 6
Triglycerides: 330 mg/dL — ABNORMAL HIGH (ref 0.0–149.0)
VLDL: 66 mg/dL — ABNORMAL HIGH (ref 0.0–40.0)

## 2010-09-06 LAB — POCT URINALYSIS DIPSTICK
Nitrite, UA: NEGATIVE
Protein, UA: NEGATIVE
Urobilinogen, UA: 0.2
pH, UA: 5

## 2010-09-06 LAB — CBC WITH DIFFERENTIAL/PLATELET
Basophils Absolute: 0 10*3/uL (ref 0.0–0.1)
Eosinophils Absolute: 0.4 10*3/uL (ref 0.0–0.7)
HCT: 51.5 % (ref 39.0–52.0)
Lymphs Abs: 2.2 10*3/uL (ref 0.7–4.0)
Monocytes Absolute: 0.6 10*3/uL (ref 0.1–1.0)
Monocytes Relative: 8 % (ref 3.0–12.0)
Platelets: 219 10*3/uL (ref 150.0–400.0)
RDW: 13.7 % (ref 11.5–14.6)

## 2010-09-06 LAB — BASIC METABOLIC PANEL
BUN: 15 mg/dL (ref 6–23)
GFR: 83.61 mL/min (ref >60.00)
Glucose, Bld: 104 mg/dL — ABNORMAL HIGH (ref 70–99)
Potassium: 5.2 mEq/L — ABNORMAL HIGH (ref 3.5–5.1)

## 2010-09-06 LAB — HEPATIC FUNCTION PANEL
AST: 35 U/L (ref 0–37)
Total Bilirubin: 0.3 mg/dL (ref 0.3–1.2)

## 2010-09-08 ENCOUNTER — Other Ambulatory Visit: Payer: Self-pay

## 2010-09-08 MED ORDER — NEBIVOLOL HCL 5 MG PO TABS: 5.0000 mg | ORAL_TABLET | Freq: Every day | ORAL | Status: DC

## 2010-09-12 ENCOUNTER — Encounter: Payer: Self-pay | Admitting: Internal Medicine

## 2010-09-12 ENCOUNTER — Ambulatory Visit (INDEPENDENT_AMBULATORY_CARE_PROVIDER_SITE_OTHER): Payer: BC Managed Care – PPO | Admitting: Internal Medicine

## 2010-09-12 DIAGNOSIS — E785 Hyperlipidemia, unspecified: Secondary | ICD-10-CM

## 2010-09-12 DIAGNOSIS — Z0000 Encounter for general adult medical examination without abnormal findings: Secondary | ICD-10-CM

## 2010-09-12 DIAGNOSIS — Z23 Encounter for immunization: Secondary | ICD-10-CM

## 2010-09-12 DIAGNOSIS — R8290 Unspecified abnormal findings in urine: Secondary | ICD-10-CM

## 2010-09-12 DIAGNOSIS — R82998 Other abnormal findings in urine: Secondary | ICD-10-CM

## 2010-09-12 DIAGNOSIS — J449 Chronic obstructive pulmonary disease, unspecified: Secondary | ICD-10-CM

## 2010-09-12 DIAGNOSIS — R3129 Other microscopic hematuria: Secondary | ICD-10-CM

## 2010-09-12 DIAGNOSIS — F17200 Nicotine dependence, unspecified, uncomplicated: Secondary | ICD-10-CM

## 2010-09-12 LAB — URINALYSIS, ROUTINE W REFLEX MICROSCOPIC
Specific Gravity, Urine: >=1.03 (ref 1.000–1.030)
Total Protein, Urine: NEGATIVE
Urine Glucose: NEGATIVE
pH: 5 (ref 5.0–8.0)

## 2010-09-13 ENCOUNTER — Ambulatory Visit (INDEPENDENT_AMBULATORY_CARE_PROVIDER_SITE_OTHER)
Admission: RE | Admit: 2010-09-13 | Discharge: 2010-09-13 | Disposition: A | Discharge status: Home / Self Care | Payer: BC Managed Care – PPO | Source: Ambulatory Visit | Attending: Internal Medicine | Admitting: Internal Medicine

## 2010-09-13 DIAGNOSIS — J449 Chronic obstructive pulmonary disease, unspecified: Secondary | ICD-10-CM

## 2010-09-14 ENCOUNTER — Telehealth: Payer: Self-pay

## 2010-09-14 NOTE — Telephone Encounter (Signed)
Message copied by Beverely Low on Thu Sep 14, 2010  1:09 PM ------      Message from: Staci Righter      Created: Wed Sep 13, 2010  5:54 PM       No mass or nodule seen on cxr

## 2010-09-14 NOTE — Telephone Encounter (Signed)
Pt.notified

## 2010-09-14 NOTE — Telephone Encounter (Signed)
Message copied by Beverely Low on Thu Sep 14, 2010  1:19 PM ------      Message from: Staci Righter      Created: Wed Sep 13, 2010  5:53 PM       No blood in urine

## 2010-09-17 DIAGNOSIS — Z0000 Encounter for general adult medical examination without abnormal findings: Secondary | ICD-10-CM | POA: Insufficient documentation

## 2010-09-17 DIAGNOSIS — R8290 Unspecified abnormal findings in urine: Secondary | ICD-10-CM | POA: Insufficient documentation

## 2010-09-17 NOTE — Progress Notes (Signed)
Subjective:    Patient ID: Daniel [name], male    DOB: Nov 19, 1955, 55 y.o.   MRN: [id number]  HPI patient presents to clinic for annual physical exam. Reviewed labs including urinalysis with possible microscopic hematuria. Denies abdominal pain flank pain or gross hematuria. Was told in the past he had changes on his chest x-ray suggestive of early COPD. Does continue to smoke on a regular basis. Previously used spiriva though not currently. Previous scrotal skin infection resolved with brief course of right hand pain. Reviewed hemoglobin minimally elevated and triglycerides of 336. Tobacco cessation discussed in excess of 5 minutes including potential morbidity of continued use and possible treatments to assist in cessation. No other complaint  Reviewed past medical history, medications and allergies  Review of Systems see history of present illness     Objective:   Physical Exam    Physical Exam  Vitals reviewed. Constitutional:  appears well-developed and well-nourished. No distress.  HENT:  Head: Normocephalic and atraumatic.  Right Ear: Tympanic membrane, external ear and ear canal normal.  Left Ear: Tympanic membrane, external ear and ear canal normal.  Nose: Nose normal.  Mouth/Throat: Oropharynx is clear and moist. No oropharyngeal exudate.  Eyes: Conjunctivae and EOM are normal. Pupils are equal, round, and reactive to light. Right eye exhibits no discharge. Left eye exhibits no discharge. No scleral icterus.  Neck: Neck supple. No thyromegaly present. No carotid bruits Cardiovascular: Normal rate, regular rhythm and normal heart sounds.  Exam reveals no gallop and no friction rub.   No murmur heard. Pulmonary/Chest: Effort normal and breath sounds normal. No respiratory distress.  has no wheezes.  has no rales.  Abdomen: Soft, nondistended, nontender to palpation positive bowel sounds no masses organomegaly noted Lymphadenopathy:   no cervical adenopathy.  Neurological:  is  alert.  Skin: Skin is warm and dry.  not diaphoretic.  Psychiatric: normal mood and affect.  GU: Bilaterally descended testes without nodularity. No hernia on exam. DRE: good sphincter times soft brown stool in the vault without gross blood heme-negative prostate soft nontender without nodularity    Assessment & Plan:

## 2010-09-17 NOTE — Assessment & Plan Note (Signed)
Counseled regarding the need for cessation. States understanding 

## 2010-09-17 NOTE — Assessment & Plan Note (Signed)
Exam normal. Reviewed labs. EKG obtained demonstrates normal sinus rhythm at a rate of 78. Possible early repolarization changes noted. Normal axis and intervals.

## 2010-09-17 NOTE — Assessment & Plan Note (Signed)
Low-fat diet exercise and weight loss recommended. Begin OTC fish oil

## 2010-09-17 NOTE — Assessment & Plan Note (Signed)
Asymptomatic. Obtain urinalysis with microscopic exam

## 2010-09-25 ENCOUNTER — Other Ambulatory Visit: Payer: Self-pay | Admitting: Internal Medicine

## 2010-09-25 ENCOUNTER — Telehealth: Payer: Self-pay

## 2010-09-25 MED ORDER — METHYLPHENIDATE HCL ER (OSM) 54 MG PO TBCR: 54.0000 mg | EXTENDED_RELEASE_TABLET | ORAL | Status: DC

## 2010-09-25 NOTE — Telephone Encounter (Signed)
Pt called req to get script for Concerta 54 mg 1 month supply.

## 2010-09-25 NOTE — Telephone Encounter (Signed)
DONE

## 2010-09-25 NOTE — Telephone Encounter (Signed)
Rx ready to pick up. Pt notified. 

## 2010-10-13 ENCOUNTER — Telehealth: Payer: Self-pay | Admitting: *Deleted

## 2010-10-13 MED ORDER — ZOLPIDEM TARTRATE 10 MG PO TABS: 10.0000 mg | ORAL_TABLET | Freq: Every evening | ORAL | Status: DC | PRN

## 2010-10-13 NOTE — Telephone Encounter (Signed)
Received fax from pharmacy requesting prior auth on Cymbalta 30mg  1 capsule three times a day. Spoke with CSR at [employer] [phone]) and requested form be faxed to our office. Left message for pt to return my call to notify him of process and see if he needs samples until authorization can be obtained.

## 2010-10-13 NOTE — Telephone Encounter (Signed)
Pt has been notified of process and states he has some Cymbalta on hand, does not need samples at this time. Pt requested refill on Ambien. Refill left on voicemail at [facility] Ambien # [id number] x no refills.

## 2010-10-17 NOTE — Telephone Encounter (Signed)
Form received and forwarded to Provider for completion and signature. 

## 2010-10-24 NOTE — Telephone Encounter (Signed)
Prior authorization form faxed [phone]. Awaiting approval/denial status.

## 2010-10-25 NOTE — Telephone Encounter (Signed)
Faxed received from [facility] stating prior authorization was not processed because the patient does not have active pharmacy coverage at this time with [facility].  Call placed to patient at [phone], he was informed of fax received from [facility]. He stated that he has spoken with [facility] and that has been taken care of. He was advised by [facility] that he has to wait a couple of days for processing. Nothing further needed at this time.

## 2010-10-31 ENCOUNTER — Other Ambulatory Visit: Payer: Self-pay | Admitting: Internal Medicine

## 2010-11-02 ENCOUNTER — Other Ambulatory Visit: Payer: Self-pay | Admitting: Internal Medicine

## 2010-11-02 ENCOUNTER — Telehealth: Payer: Self-pay | Admitting: Internal Medicine

## 2010-11-02 NOTE — Telephone Encounter (Signed)
Received fax from patient stating that he needs refills on Concerta and Lipitor. [facility] on [address] in [place].

## 2010-11-03 MED ORDER — METHYLPHENIDATE HCL ER (OSM) 54 MG PO TBCR: 54.0000 mg | EXTENDED_RELEASE_TABLET | ORAL | Status: DC

## 2010-11-03 NOTE — Telephone Encounter (Signed)
Rx printed and forwarded to provider for signature.   

## 2010-11-03 NOTE — Telephone Encounter (Signed)
Call placed to patient at [phone], he was informed Rx for Concerta was available for patient pick up at office.

## 2010-11-13 ENCOUNTER — Other Ambulatory Visit: Payer: Self-pay | Admitting: Internal Medicine

## 2010-11-13 ENCOUNTER — Telehealth: Payer: Self-pay | Admitting: Internal Medicine

## 2010-11-13 NOTE — Telephone Encounter (Signed)
Refill- zolpidem tartrate 10mg  tablet. Take one tablet by mouth at bedtime if needed. Qty 30. Last fill 7.27.12

## 2010-11-14 MED ORDER — ZOLPIDEM TARTRATE 10 MG PO TABS: 10.0000 mg | ORAL_TABLET | Freq: Every evening | ORAL | Status: DC | PRN

## 2010-11-14 NOTE — Telephone Encounter (Signed)
Ok #90

## 2010-11-14 NOTE — Telephone Encounter (Signed)
Rx refill called to pharmacist Clint.

## 2010-11-14 NOTE — Telephone Encounter (Signed)
Is it okay to refill Valium per Clint pharmacist at [facility] last refill for qty 90 on 7/9/20112. He stated patient has requested refill.  Call placed to patient at [phone], he was informed of medication refill to pharmacy, and reminded of follow up appointment due for October. He was informed no future appointment was on file and offered to schedule. Patient declined to schedule appointment and stated that he would call back closer to October, after he reviews his schedule.

## 2010-11-14 NOTE — Telephone Encounter (Signed)
Ok 1 month rf 2

## 2010-11-14 NOTE — Telephone Encounter (Signed)
Rx called to [facility] pharmacy.

## 2010-11-14 NOTE — Telephone Encounter (Signed)
Is it okay to refill Zolpidem for this patient?

## 2010-12-18 ENCOUNTER — Encounter: Payer: Self-pay | Admitting: Internal Medicine

## 2010-12-18 ENCOUNTER — Ambulatory Visit (INDEPENDENT_AMBULATORY_CARE_PROVIDER_SITE_OTHER): Payer: BC Managed Care – PPO | Admitting: Internal Medicine

## 2010-12-18 VITALS — BP 124/80 | HR 80 | Temp 98.1°F | Resp 18 | Ht 73.0 in | Wt 280.0 lb

## 2010-12-18 DIAGNOSIS — L723 Sebaceous cyst: Secondary | ICD-10-CM

## 2010-12-18 DIAGNOSIS — E291 Testicular hypofunction: Secondary | ICD-10-CM

## 2010-12-18 DIAGNOSIS — L729 Follicular cyst of the skin and subcutaneous tissue, unspecified: Secondary | ICD-10-CM | POA: Insufficient documentation

## 2010-12-18 DIAGNOSIS — F32A Depression, unspecified: Secondary | ICD-10-CM

## 2010-12-18 DIAGNOSIS — Z23 Encounter for immunization: Secondary | ICD-10-CM

## 2010-12-18 DIAGNOSIS — F341 Dysthymic disorder: Secondary | ICD-10-CM

## 2010-12-18 DIAGNOSIS — F329 Major depressive disorder, single episode, unspecified: Secondary | ICD-10-CM

## 2010-12-18 MED ORDER — METHYLPHENIDATE HCL ER (OSM) 54 MG PO TBCR: 54.0000 mg | EXTENDED_RELEASE_TABLET | ORAL | Status: DC

## 2010-12-18 NOTE — Assessment & Plan Note (Signed)
Stable. Continue concerta. Asx. Medication rf'ed.

## 2010-12-18 NOTE — Assessment & Plan Note (Signed)
Exam c/w possible sebaceous cyst. Observation recommended. asx

## 2010-12-18 NOTE — Progress Notes (Signed)
Subjective:    Patient ID: Daniel [name], male    DOB: May 11, 1955, 55 y.o.   MRN: [id number]  HPI Pt presents to clinic for followup of multiple medical problems. Requests refill on concerta used for depression. Tolerates without adverse effect. Denies anxiety/agitation, elevated bp or palpitations. H/o hypogonadism followed by endocrinology. States stopped testosterone injxns ~2 months after ran out and testosterone level was nl. Is beginning to feel fatigue. Notes small ST mass under skin of posterior scrotum. May have bled one day but resolved. No redness/warmth noted. No other complaints.  Past Medical History  Diagnosis Date  . Depression   . Anxiety   . Hyperlipidemia   . Hypertension    No past surgical history on file.  reports that he has been smoking.  He has never used smokeless tobacco. He reports that he drinks alcohol. His drug history not on file. family history includes Cancer in his mother; Emphysema in his father; Heart disease in his father; Hyperlipidemia in his father; Hypertension in his father; and Stroke in his father. No Known Allergies   Review of Systems see hpi     Objective:   Physical Exam  Physical Exam  Nursing note and vitals reviewed. Constitutional: Appears well-developed and well-nourished. No distress.  HENT:  Head: Normocephalic and atraumatic.  Right Ear: External ear normal.  Left Ear: External ear normal.  Eyes: Conjunctivae are normal. No scleral icterus.  Neck: Neck supple. Carotid bruit is not present.  Cardiovascular: Normal rate, regular rhythm and normal heart sounds.  Exam reveals no gallop and no friction rub.   No murmur heard. Pulmonary/Chest: Effort normal and breath sounds normal. No respiratory distress. He has no wheezes. no rales.  Lymphadenopathy:    He has no cervical adenopathy.  Neurological:Alert.  Skin: Skin is warm and dry. Not diaphoretic.  Psychiatric: Has a normal mood and affect.   GU: scrotum without erythema  or induration. Posterior midline area does demonstrate small ~1cm st nodule with small punctate white/yellow mid point. NT and without drainage.       Assessment & Plan:

## 2010-12-18 NOTE — Assessment & Plan Note (Signed)
Explained need for continued supplementation. States will contact endocrine office for RF and f/u.

## 2010-12-23 ENCOUNTER — Other Ambulatory Visit: Payer: Self-pay | Admitting: Internal Medicine

## 2010-12-25 NOTE — Telephone Encounter (Signed)
Is it okay to provide refill on Diazepam?

## 2010-12-25 NOTE — Telephone Encounter (Signed)
Call placed to [facility] at  [phone], Verbal refill provided to pharmacist for Diazepam.

## 2010-12-25 NOTE — Telephone Encounter (Signed)
Ok #90

## 2011-01-12 ENCOUNTER — Encounter: Payer: Self-pay | Admitting: Internal Medicine

## 2011-01-12 ENCOUNTER — Ambulatory Visit (INDEPENDENT_AMBULATORY_CARE_PROVIDER_SITE_OTHER): Payer: BC Managed Care – PPO | Admitting: Internal Medicine

## 2011-01-12 ENCOUNTER — Ambulatory Visit (HOSPITAL_BASED_OUTPATIENT_CLINIC_OR_DEPARTMENT_OTHER)
Admission: RE | Admit: 2011-01-12 | Discharge: 2011-01-12 | Disposition: A | Discharge status: Home / Self Care | Payer: BC Managed Care – PPO | Source: Ambulatory Visit | Attending: Internal Medicine | Admitting: Internal Medicine

## 2011-01-12 DIAGNOSIS — K5900 Constipation, unspecified: Secondary | ICD-10-CM | POA: Insufficient documentation

## 2011-01-12 DIAGNOSIS — F341 Dysthymic disorder: Secondary | ICD-10-CM

## 2011-01-12 MED ORDER — METHYLPHENIDATE HCL ER (OSM) 54 MG PO TBCR: 54.0000 mg | EXTENDED_RELEASE_TABLET | ORAL | Status: DC

## 2011-01-12 NOTE — Assessment & Plan Note (Signed)
Stable. rf concerta

## 2011-01-12 NOTE — Progress Notes (Signed)
Subjective:    Patient ID: Daniel [name], male    DOB: 1956-03-15, 55 y.o.   MRN: [id number]  HPI Pt presents to clinic for evaluation of constipation. Notes 3 day h/o constipation with last BM occuring then. Attempted fleets enema x3 with minimal results. Attempted no po medication. Denies nausea or vomitting however feels bloated and like food is backing up into esophagous. Denies fever,chills, or abdominal pain. No obvious triggers including change in po intake or new medications. States similar episode several years ago and it required use of go-lytely to resolved. No other alleviating or exacerbating factors. Also requests refill of concerta. Will run out in a few days. Tolerates medication without side effect. No other complaints.  Past Medical History  Diagnosis Date  . Depression   . Anxiety   . Hyperlipidemia   . Hypertension    No past surgical history on file.  reports that he has been smoking.  He has never used smokeless tobacco. He reports that he drinks alcohol. His drug history not on file. family history includes Cancer in his mother; Emphysema in his father; Heart disease in his father; Hyperlipidemia in his father; Hypertension in his father; and Stroke in his father. No Known Allergies     Review of Systems see hpi     Objective:   Physical Exam  Nursing note and vitals reviewed. Constitutional: He appears well-developed and well-nourished. No distress.  HENT:  Head: Normocephalic and atraumatic.  Abdominal: Soft. Bowel sounds are normal. He exhibits no distension and no mass. There is no tenderness. There is no rebound and no guarding.  Neurological: He is alert.  Skin: Skin is warm and dry. He is not diaphoretic.  Psychiatric: He has a normal mood and affect.          Assessment & Plan:

## 2011-01-12 NOTE — Patient Instructions (Signed)
Please try 1/2 bottle of magnesium citrate (over ice or refridgerated). If no bowel movement after one hour then repeat 1/2 bottle.

## 2011-01-12 NOTE — Assessment & Plan Note (Signed)
Attempt magnesium citrate 1/2 bottle over ice. Repeat 1/2 bottle if no BM within 1-1.5 hours. Obtain plain abd radiograph. Followup if no improvement or worsening.

## 2011-01-30 ENCOUNTER — Other Ambulatory Visit: Payer: Self-pay | Admitting: Internal Medicine

## 2011-01-31 NOTE — Telephone Encounter (Signed)
ok 

## 2011-01-31 NOTE — Telephone Encounter (Signed)
Rx refill called to pharmacist.

## 2011-01-31 NOTE — Telephone Encounter (Signed)
It it okay to provide refills on Diazepam?

## 2011-02-12 ENCOUNTER — Other Ambulatory Visit: Payer: Self-pay | Admitting: Internal Medicine

## 2011-02-12 NOTE — Telephone Encounter (Signed)
Call placed to [facility] [phone], verbal refill provided to pharmacist.

## 2011-03-07 ENCOUNTER — Encounter: Payer: Self-pay | Admitting: Internal Medicine

## 2011-03-07 ENCOUNTER — Ambulatory Visit (INDEPENDENT_AMBULATORY_CARE_PROVIDER_SITE_OTHER): Payer: BC Managed Care – PPO | Admitting: Internal Medicine

## 2011-03-07 DIAGNOSIS — R079 Chest pain, unspecified: Secondary | ICD-10-CM

## 2011-03-07 DIAGNOSIS — E669 Obesity, unspecified: Secondary | ICD-10-CM

## 2011-03-07 NOTE — Patient Instructions (Signed)
Please schedule nuclear exercise stress test

## 2011-03-11 DIAGNOSIS — E669 Obesity, unspecified: Secondary | ICD-10-CM | POA: Insufficient documentation

## 2011-03-11 DIAGNOSIS — R079 Chest pain, unspecified: Secondary | ICD-10-CM | POA: Insufficient documentation

## 2011-03-11 NOTE — Progress Notes (Signed)
Subjective:    Patient ID: Daniel [name], male    DOB: 1956-02-14, 55 y.o.   MRN: [id number]  HPI Pt presents to clinic for evaluation of multiple problems. Notes intemittent substernal chest pain with duration 5-20 mins. Is nonexertional without radiation or associated n/v, diaphoresis or dyspnea. May occur 1-2x /day. Denies current sx's. Has multiple risk factors for CAD. Pain not reproducible or positional. Also notes 50+lb wt gain over 12months. Interested in possible bariatric surgery evaluation.   Past Medical History  Diagnosis Date  . Depression   . Anxiety   . Hyperlipidemia   . Hypertension    No past surgical history on file.  reports that he has been smoking.  He has never used smokeless tobacco. He reports that he drinks alcohol. His drug history not on file. family history includes Cancer in his mother; Emphysema in his father; Heart disease in his father; Hyperlipidemia in his father; Hypertension in his father; and Stroke in his father. No Known Allergies   Review of Systems see hpi     Objective:   Physical Exam  Nursing note and vitals reviewed. Constitutional: He appears well-developed and well-nourished. No distress.  HENT:  Head: Normocephalic and atraumatic.  Right Ear: External ear normal.  Left Ear: External ear normal.  Eyes: Conjunctivae are normal. No scleral icterus.  Neck: Neck supple.  Cardiovascular: Normal rate, regular rhythm and normal heart sounds.  Exam reveals no gallop and no friction rub.   No murmur heard. Pulmonary/Chest: Effort normal and breath sounds normal.  Musculoskeletal: He exhibits no tenderness.  Neurological: He is alert.  Skin: Skin is warm and dry. He is not diaphoretic.  Psychiatric: He has a normal mood and affect.          Assessment & Plan:

## 2011-03-11 NOTE — Assessment & Plan Note (Signed)
EKG obtained demonstrates NSR with occasional pac. Nl intervals and axis. No evidence of acute ischemic change. Schedule nuclear exercise stress test for further evaluation.

## 2011-03-11 NOTE — Assessment & Plan Note (Signed)
Pt interested in bariatric surgery consult at [facility]. Intends to contact them directly

## 2011-03-14 ENCOUNTER — Ambulatory Visit (HOSPITAL_COMMUNITY): Payer: BC Managed Care – PPO | Admitting: Radiology

## 2011-03-15 ENCOUNTER — Other Ambulatory Visit: Payer: Self-pay | Admitting: Internal Medicine

## 2011-03-15 MED ORDER — METHYLPHENIDATE HCL ER (OSM) 54 MG PO TBCR: 54.0000 mg | EXTENDED_RELEASE_TABLET | ORAL | Status: DC

## 2011-03-15 NOTE — Telephone Encounter (Signed)
Verified with pt that he takes Wellbutrin 150mg  and 300mg  daily. Refill sent to pharmacy for 150mg  #30 x 7 refills.

## 2011-03-16 ENCOUNTER — Telehealth: Payer: Self-pay | Admitting: *Deleted

## 2011-03-16 NOTE — Telephone Encounter (Signed)
Left message for pt to return my call. Concerta Rx is ready for pick up, referral placed for [facility] (await info from Referral co-ord), and need to verify if pt wants to pick up bariatric letter when he picks up Concerta rx?

## 2011-03-19 NOTE — Telephone Encounter (Signed)
Spoke with pt, he will pick up Rx and bariatric letter together and they have been left at front desk for pick up; has already been contacted re: cardiology referral.

## 2011-03-21 ENCOUNTER — Telehealth: Payer: Self-pay | Admitting: Family

## 2011-03-21 MED ORDER — DIAZEPAM 5 MG PO TABS: 5.0000 mg | ORAL_TABLET | Freq: Three times a day (TID) | ORAL | Status: DC | PRN

## 2011-03-21 NOTE — Telephone Encounter (Signed)
Pt came to front desk requesting refill on valium.  Refill was printed and provided to pt.

## 2011-03-27 NOTE — Telephone Encounter (Signed)
Received call from [place] at [facility] [phone] stating they never received our referral for pt's stress test. Referral printed, signed and faxed to [phone]. Verified receipt of referral with Erica.

## 2011-04-03 ENCOUNTER — Telehealth: Payer: Self-pay | Admitting: Internal Medicine

## 2011-04-03 NOTE — Telephone Encounter (Signed)
He had a stress test at [facility] in [place] last Tuesday.  He would like the results

## 2011-04-04 NOTE — Telephone Encounter (Signed)
Found the faxed results.  normal

## 2011-04-04 NOTE — Telephone Encounter (Signed)
Call placed to patient at [phone], he was informed per Dr Rodena Medin instructions.

## 2011-04-27 ENCOUNTER — Other Ambulatory Visit: Payer: Self-pay | Admitting: Family

## 2011-04-27 NOTE — Telephone Encounter (Signed)
Pt requesting refill.  Did not have exercise stress test.  Has not been seen since 12/19.  No follow up appts scheduled.  Please advise.

## 2011-04-27 NOTE — Telephone Encounter (Signed)
Ok to fill with 2rf. Had stress test performed in [place].

## 2011-04-30 NOTE — Telephone Encounter (Signed)
Verbal refill for Diazepam provided to pharmacist at  [facility].

## 2011-05-02 ENCOUNTER — Other Ambulatory Visit: Payer: Self-pay | Admitting: *Deleted

## 2011-05-02 NOTE — Telephone Encounter (Signed)
Patient sent faxed request for refill in Concerta 54 mg. He states that he has 3 days remaining and would like to stop by the office to pick up the Rx.

## 2011-05-02 NOTE — Telephone Encounter (Signed)
yes

## 2011-05-03 MED ORDER — METHYLPHENIDATE HCL ER (OSM) 54 MG PO TBCR: 54.0000 mg | EXTENDED_RELEASE_TABLET | ORAL | Status: DC

## 2011-05-03 NOTE — Telephone Encounter (Signed)
Rx's signed by provider.Call placed to patient at [phone], no answer. A detailed voice message was left informing patient Rx for Concerta is available for pick up.

## 2011-05-03 NOTE — Telephone Encounter (Signed)
Rx's printed and sent to provider for signature 

## 2011-05-03 NOTE — Telephone Encounter (Signed)
Patient has stopped by office and picked up Rx's.

## 2011-05-10 ENCOUNTER — Other Ambulatory Visit: Payer: Self-pay | Admitting: Internal Medicine

## 2011-05-10 NOTE — Telephone Encounter (Signed)
Call placed to [facility] at [phone], a verbal order was provided to pharmacist for refill.

## 2011-05-18 ENCOUNTER — Telehealth: Payer: Self-pay | Admitting: *Deleted

## 2011-05-18 ENCOUNTER — Ambulatory Visit: Payer: BC Managed Care – PPO | Admitting: Endocrinology

## 2011-05-18 NOTE — Telephone Encounter (Signed)
Patient called and left voice message stating he was seen for bariatric surgery last week, and he received a phone call from a physician at [facility] advising him to follow up with his primary care immediately. He was informed that he is diabetic. Patient is requesting a return phone call.

## 2011-05-18 NOTE — Telephone Encounter (Signed)
Call returned to patient at [phone], he states he was seen last week at [facility] and received a call about an hour ago informing him he was diabetic. Patient asked about appointment scheduled with Dr Everardo All on 05/28/2011, and stated that he would like to schedule with Dr Rodena Medin. Appointment scheduled for patient for Monday 05/21/2011 @ 11am with Dr Rodena Medin per patient request.

## 2011-05-21 ENCOUNTER — Encounter: Payer: Self-pay | Admitting: Internal Medicine

## 2011-05-21 ENCOUNTER — Ambulatory Visit (INDEPENDENT_AMBULATORY_CARE_PROVIDER_SITE_OTHER): Payer: BC Managed Care – PPO | Admitting: Internal Medicine

## 2011-05-21 VITALS — BP 120/82 | HR 63 | Temp 98.4°F | Resp 18 | Wt 275.0 lb

## 2011-05-21 DIAGNOSIS — E559 Vitamin D deficiency, unspecified: Secondary | ICD-10-CM | POA: Insufficient documentation

## 2011-05-21 DIAGNOSIS — E119 Type 2 diabetes mellitus without complications: Secondary | ICD-10-CM

## 2011-05-21 DIAGNOSIS — E118 Type 2 diabetes mellitus with unspecified complications: Secondary | ICD-10-CM | POA: Insufficient documentation

## 2011-05-21 MED ORDER — METFORMIN HCL 500 MG PO TABS: 500.0000 mg | ORAL_TABLET | Freq: Every day | ORAL | Status: DC

## 2011-05-21 NOTE — Assessment & Plan Note (Signed)
New dx. Begin fsbs qd (script for glucometer and supplies given). Refer for diabetic education. Recommend low sugar/carb diet, regular exercise and wt loss. Attempt low dose metformin and schedule close f/u for fsbs log review in 3wks or sooner if needed.

## 2011-05-21 NOTE — Progress Notes (Signed)
Subjective:    Patient ID: Daniel [name], male    DOB: Oct 27, 1955, 56 y.o.   MRN: [id number]  HPI Pt presents to clinic for evaluation of new dx of DM. Was being evaluated at [facility] for possible bariatric surgery when lab tests showed a1c 7.4 with glucose of 107. No past h/o dm. tg 238, tchol 177, hdl 32, ldl 97 and lft, cr and tsh nl. Vitamin d found to be quite low at 10.8 and was begin on vit d 5000 units qd. No polyuria or polydipsia. Since found out that his insurance company will note cover the bariatric surgery. No active complaint. Total time of visit ~30 mins of which >50% spent in counseling.   Past Medical History  Diagnosis Date  . Depression   . Anxiety   . Hyperlipidemia   . Hypertension    No past surgical history on file.  reports that he has been smoking.  He has never used smokeless tobacco. He reports that he drinks alcohol. His drug history not on file. family history includes Cancer in his mother; Emphysema in his father; Heart disease in his father; Hyperlipidemia in his father; Hypertension in his father; and Stroke in his father. No Known Allergies   Review of Systems see hpi     Objective:   Physical Exam  Nursing note and vitals reviewed. Constitutional: He appears well-developed and well-nourished. No distress.  Neurological: He is alert.  Skin: He is not diaphoretic.  Psychiatric: He has a normal mood and affect.          Assessment & Plan:

## 2011-05-28 ENCOUNTER — Ambulatory Visit (INDEPENDENT_AMBULATORY_CARE_PROVIDER_SITE_OTHER): Payer: BC Managed Care – PPO | Admitting: Endocrinology

## 2011-05-28 ENCOUNTER — Other Ambulatory Visit (INDEPENDENT_AMBULATORY_CARE_PROVIDER_SITE_OTHER): Payer: BC Managed Care – PPO

## 2011-05-28 ENCOUNTER — Encounter: Payer: Self-pay | Admitting: Endocrinology

## 2011-05-28 ENCOUNTER — Ambulatory Visit: Payer: BC Managed Care – PPO

## 2011-05-28 VITALS — BP 128/82 | HR 70 | Temp 97.6°F | Ht 72.0 in | Wt 271.8 lb

## 2011-05-28 DIAGNOSIS — E291 Testicular hypofunction: Secondary | ICD-10-CM

## 2011-05-28 LAB — TESTOSTERONE: Testosterone: 197.92 ng/dL — ABNORMAL LOW (ref 350.00–890.00)

## 2011-05-28 NOTE — Patient Instructions (Addendum)
blood tests are being requested for you today.  please call [phone] to hear your test results.  You will be prompted to enter the 9-digit "MRN" number that appears at the top left of this page, followed by #.  Then you will hear the message.   Based on the results, i hope to be able to prescribe you a pill for the testosterone this time. (update: i left message on phone-tree:  i sent rx for clomid.  Go to lab in 1 month for recheck)

## 2011-05-28 NOTE — Progress Notes (Signed)
Subjective:    Patient ID: Daniel [name], male    DOB: 1955/08/16, 56 y.o.   MRN: [id number]  HPI Pt returns for f/u of hypogonadism of uncertain etiology (2009).  He has been off testosterone injections x 8 mos.  He has few mos of fatigue and decreased libido.   He was recently started on metformin for DM.   Past Medical History  Diagnosis Date  . Depression   . Anxiety   . Hyperlipidemia   . Hypertension     No past surgical history on file.  History   Social History  . Marital Status: Single    Spouse Name: N/A    Number of Children: N/A  . Years of Education: N/A   Occupational History  . Not on file.   Social History Main Topics  . Smoking status: Current Everyday Smoker -- 1.0 packs/day  . Smokeless tobacco: Never Used  . Alcohol Use: Yes  . Drug Use: Not on file  . Sexually Active: Not on file   Other Topics Concern  . Not on file   Social History Narrative   Regular exercise-no    Current Outpatient Prescriptions on File Prior to Visit  Medication Sig Dispense Refill  . atorvastatin (LIPITOR) 10 MG tablet take 1 tablet by mouth once daily  30 tablet  6  . buPROPion (WELLBUTRIN XL) 150 MG 24 hr tablet take 1 tablet by mouth once daily  30 tablet  7  . buPROPion (WELLBUTRIN XL) 300 MG 24 hr tablet take 1 tablet by mouth once daily  30 tablet  11  . clobetasol (TEMOVATE) 0.05 % external solution USE TO SCALP AS DIRECTED  50 mL  6  . diazepam (VALIUM) 5 MG tablet TAKE 1 TABLET BY MOUTH EVERY 8 HOURS AS NEEDED FOR ANXIETY  90 tablet  2  . DULoxetine (CYMBALTA) 30 MG capsule Take 3 tabs daily  90 capsule  11  . metFORMIN (GLUCOPHAGE) 500 MG tablet Take 1 tablet (500 mg total) by mouth daily with breakfast.  30 tablet  6  . methylphenidate (CONCERTA) 54 MG CR tablet Take 1 tablet (54 mg total) by mouth every morning. Fill on or after 06/02/2011  30 tablet  0  . methylphenidate (CONCERTA) 54 MG CR tablet Take 1 tablet (54 mg total) by mouth every morning. Fill on or  after 05/05/2011  30 tablet  0  . methylphenidate (CONCERTA) 54 MG CR tablet Take 1 tablet (54 mg total) by mouth every morning. Fill on or after 07/03/2011  30 tablet  0  . nebivolol (BYSTOLIC) 5 MG tablet Take 1 tablet (5 mg total) by mouth daily.  60 tablet  11  . zolpidem (AMBIEN) 10 MG tablet take 1 tablet by mouth at bedtime if needed  30 tablet  3    No Known Allergies  Family History  Problem Relation Age of Onset  . Cancer Mother     ovarian  . Hyperlipidemia Father   . Hypertension Father   . Stroke Father   . Heart disease Father   . Emphysema Father     BP 128/82  Pulse 70  Temp(Src) 97.6 F (36.4 C) (Oral)  Ht 6' (1.829 m)  Wt 271 lb 12.8 oz (123.288 kg)  BMI 36.86 kg/m2  SpO2 95%    Review of Systems  Constitutional: Negative for unexpected weight change.  Genitourinary: Negative for difficulty urinating.      Objective:   Physical Exam VITAL SIGNS:  See  vs page GENERAL: no distress GENITALIA: Normal male testicles, scrotum, and penis  Lab Results  Component Value Date   TESTOSTERONE 197.92* 05/28/2011      Assessment & Plan:  Hypogonadism, needs increased rx.  Clomid may help

## 2011-05-29 LAB — PROLACTIN: Prolactin: 5.6 ng/mL (ref 2.1–17.1)

## 2011-05-29 MED ORDER — CLOMIPHENE CITRATE 50 MG PO TABS: ORAL_TABLET | ORAL | Status: DC

## 2011-05-30 ENCOUNTER — Encounter: Payer: Self-pay | Admitting: *Deleted

## 2011-06-06 ENCOUNTER — Other Ambulatory Visit: Payer: Self-pay | Admitting: Family Medicine

## 2011-06-06 NOTE — Telephone Encounter (Signed)
Rx refill denied medication not on patients current list of medication.

## 2011-06-11 ENCOUNTER — Ambulatory Visit (INDEPENDENT_AMBULATORY_CARE_PROVIDER_SITE_OTHER): Payer: BC Managed Care – PPO | Admitting: Internal Medicine

## 2011-06-11 ENCOUNTER — Encounter: Payer: Self-pay | Admitting: Internal Medicine

## 2011-06-11 VITALS — BP 124/80 | HR 84 | Temp 98.3°F | Resp 18 | Wt 269.0 lb

## 2011-06-11 DIAGNOSIS — E119 Type 2 diabetes mellitus without complications: Secondary | ICD-10-CM

## 2011-06-11 DIAGNOSIS — E785 Hyperlipidemia, unspecified: Secondary | ICD-10-CM

## 2011-06-11 MED ORDER — FENOFIBRATE 48 MG PO TABS: 48.0000 mg | ORAL_TABLET | Freq: Every day | ORAL | Status: DC

## 2011-06-11 NOTE — Patient Instructions (Signed)
Please schedule fasting labs prior to next appointment Chem7, a1c, urine microalbumin 250.0 and lipid/lft 272.4

## 2011-06-12 ENCOUNTER — Telehealth: Payer: Self-pay | Admitting: *Deleted

## 2011-06-12 NOTE — Telephone Encounter (Signed)
Patient sent a fax stating the following: " I forgot to ask you something yesterday when I saw you. Back in 2011 one of the doctors at [facility] sent me to a lung doctor. I say that doctor, don't remember his name. He sent me for a sleep study and of course I have sleep apnea. He set me up with one of those breathing machines. Problem is, I have not used it much because the full face mask makes it very difficult for me to sleep. I need to see the lung doctor again and ask him if there is another solution. Can your office schedule an appt with the lung doctor for me? Many thanks Doctor."  After review of the patient chart. Patient was seen by Dr Shelle Iron back in December of 2011. Call placed to pulmonary at [phone] spoke with [facility]. Appointment scheduled with Dr Shelle Iron on Wednesday 06/20/2011 @ 2:15 pm.  Call placed to patient at [phone],he was informed of appointment date and time. Pulmonary office number and location provided to patient.

## 2011-06-17 NOTE — Progress Notes (Signed)
Subjective:    Patient ID: Daniel [name], male    DOB: Jul 23, 1955, 56 y.o.   MRN: [id number]  HPI Pt presents to clinic for follow up of DM. Recent dx with a1c 7.4. Reviewed fsbs log with range of 98-131. Has lost 6 lbs. Tolerating metformin without side effects. Diabetic education appt pending. No complaints. Total time of visit ~20 mins of which >50% spent in counseling.  Past Medical History  Diagnosis Date  . Depression   . Anxiety   . Hyperlipidemia   . Hypertension    No past surgical history on file.  reports that he has been smoking.  He has never used smokeless tobacco. He reports that he drinks alcohol. His drug history not on file. family history includes Cancer in his mother; Emphysema in his father; Heart disease in his father; Hyperlipidemia in his father; Hypertension in his father; and Stroke in his father. No Known Allergies   Review of Systems see hpi     Objective:   Physical Exam  Nursing note and vitals reviewed. Constitutional: He appears well-developed and well-nourished.  Neurological: He is alert.  Psychiatric: He has a normal mood and affect.          Assessment & Plan:

## 2011-06-17 NOTE — Assessment & Plan Note (Signed)
Recent dx. Continue current dose of metformin. Encouraged diabetic diet, exercise and wt loss.

## 2011-06-17 NOTE — Assessment & Plan Note (Signed)
Stable. rf tricor.

## 2011-06-20 ENCOUNTER — Encounter: Payer: Self-pay | Admitting: Pulmonary Disease

## 2011-06-20 ENCOUNTER — Ambulatory Visit (INDEPENDENT_AMBULATORY_CARE_PROVIDER_SITE_OTHER): Payer: BC Managed Care – PPO | Admitting: Pulmonary Disease

## 2011-06-20 VITALS — BP 130/76 | HR 92 | Temp 98.0°F | Ht 72.0 in | Wt 266.2 lb

## 2011-06-20 DIAGNOSIS — G4733 Obstructive sleep apnea (adult) (pediatric): Secondary | ICD-10-CM

## 2011-06-20 NOTE — Assessment & Plan Note (Signed)
The patient has moderate to severe obstructive sleep apnea, and has been intolerant of CPAP because of mask issues.  I outlined 3 possibilities for treatment.  One is to work with his existing CPAP and try to find a mask that will be more comfortable for him.  I have suggested nasal pillows, and he is willing to give this a try.  If he is able to wear the CPAP, we still need to optimize pressure.  The second option would be to consider a dental appliance while he is working on weight loss.  The third is to just work on weight loss, with the understanding that his sleep apnea will improve as he becomes more successful.  The patient would like to try nasal pillows, and we'll arrange this with his DME.

## 2011-06-20 NOTE — Progress Notes (Signed)
Subjective:    Patient ID: Daniel [name], male    DOB: [birth year], 56 y.o.   MRN: [id number]  HPI Patient comes in today for followup of his known obstructive sleep apnea.  He was started on CPAP in 2011, and asked to return in 5 weeks for followup.  The patient did not follow up with me, and also was not able to wear CPAP so discontinued.  His main complaint is having something on his space while sleeping, and did not feel that pressure was an issue for him.  He continues to have issues with nonrestorative sleep and daytime sleepiness, but has improved since he started losing weight after finding out he has diabetes.  He wishes to discuss other treatment options.   Review of Systems  Constitutional: Negative for fever and unexpected weight change.  HENT: Negative for ear pain, nosebleeds, congestion, sore throat, rhinorrhea, sneezing, trouble swallowing, dental problem, postnasal drip and sinus pressure.   Eyes: Negative for redness and itching.  Respiratory: Negative for cough, chest tightness, shortness of breath and wheezing.   Cardiovascular: Positive for leg swelling. Negative for palpitations.  Gastrointestinal: Negative for nausea and vomiting.  Genitourinary: Negative for dysuria.  Musculoskeletal: Positive for joint swelling.  Skin: Negative for rash.  Neurological: Negative for headaches.  Hematological: Does not bruise/bleed easily.  Psychiatric/Behavioral: Negative for dysphoric mood. The patient is not nervous/anxious.        Objective:   Physical Exam Overweight male in no acute distress Nose without purulence or discharge noted Chest clear to auscultation Cardiac exam regular rate and rhythm Lower extremities with mild edema, no cyanosis Awake, but appears mildly sleepy, moves all 4 extremities.       Assessment & Plan:

## 2011-06-20 NOTE — Patient Instructions (Signed)
Will try nasal pillows instead of full cpap mask to see if you tolerate better.  Will also have dme show you how to use ramp button on your cpap machine.  Please give me some feedback in 3-4 weeks with how things are going, but call sooner if having tolerance issues. Continue to work on weight loss If cpap intolerance continues, can consider referral to dental medicine to consider appliance.

## 2011-06-25 ENCOUNTER — Encounter: Payer: Self-pay | Admitting: *Deleted

## 2011-06-25 ENCOUNTER — Encounter: Payer: BC Managed Care – PPO | Attending: Internal Medicine | Admitting: *Deleted

## 2011-06-25 VITALS — Ht 72.0 in | Wt 262.0 lb

## 2011-06-25 DIAGNOSIS — E119 Type 2 diabetes mellitus without complications: Secondary | ICD-10-CM | POA: Insufficient documentation

## 2011-06-25 DIAGNOSIS — Z713 Dietary counseling and surveillance: Secondary | ICD-10-CM | POA: Insufficient documentation

## 2011-06-25 NOTE — Progress Notes (Signed)
Medical Nutrition Therapy:  Appt start time: 1130 end time:  1230.  Assessment:  Primary concerns today: Patient states he was diagnosed with Type 2 Diabetes about one month ago. He also state he has been losing weight over the last 3 months for a total of 24 pounds. He lives alone, prepares his own meals and gets very little exercise.   MEDICATIONS: see list, Diabetes medication is currently 500 mg Metformin which he takes with breakfast   DIETARY INTAKE:  Usual eating pattern includes 2-3 meals and 1 snacks per day.  Everyday foods include good variety of most food groups.  Avoided foods include sugar, refined white flour and starches right now.    24-hr recall:  B ( AM): skips  Snk ( AM): none  L ( PM): home or out to eat: leftovers OR sandwich OR , Meat & occasional veg type meal OR used to eat [place] Snk ( PM): none D ( PM): meat occasionally vegetables  Snk ( PM): smoked salmon OR fresh fruit Beverages: coffee, decaf unsweetened iced tea   Usual physical activity: none  Estimated energy needs: 1600 calories 180 g carbohydrates 120 g protein 44 g fat  Progress Towards Goal(s):  In progress.   Nutritional Diagnosis:  NB-1.1 Food and nutrition-related knowledge deficit As related to Type 2 Diabetes.  As evidenced by A1c of 7.4% when diagnosed 1 month ago.    Intervention:  Nutrition counseling and diabetes education provided. Discussed basic physiology of diabetes, rationale of self monitoring of BG including pre and post meal times, Carb Counting and reading Food Labels and ways to increase activity level on a daily basis. Plan: Aim for 3 Carb Choices per meal +/- 1 either way, 0-1 per snack if hungry Read Food Labels for total carb of foods Play more! Horses, walking to work as able Continue to check BG daily, alternating times as directed by MD  Handouts given during visit include:  Living Well with Diabetes  Carb Counting and Reading Food Labels  Meal Plan  Card  Monitoring/Evaluation:  Dietary intake, exercise, reading food labels, and body weight prn.

## 2011-06-25 NOTE — Patient Instructions (Addendum)
Plan: Aim for 3 Carb Choices per meal +/- 1 either way, 0-1 per snack if hungry Read Food Labels for total carb of foods Play more! Horses, walking to work as able Continue to check BG daily, alternating times as directed by MD

## 2011-07-01 ENCOUNTER — Other Ambulatory Visit: Payer: Self-pay | Admitting: Family Medicine

## 2011-07-02 NOTE — Telephone Encounter (Signed)
Rx refill sent to pharmacy. 

## 2011-08-10 ENCOUNTER — Other Ambulatory Visit: Payer: Self-pay | Admitting: Internal Medicine

## 2011-08-10 ENCOUNTER — Other Ambulatory Visit (INDEPENDENT_AMBULATORY_CARE_PROVIDER_SITE_OTHER): Payer: BC Managed Care – PPO

## 2011-08-10 ENCOUNTER — Other Ambulatory Visit: Payer: BC Managed Care – PPO

## 2011-08-10 ENCOUNTER — Other Ambulatory Visit: Payer: Self-pay

## 2011-08-10 DIAGNOSIS — E291 Testicular hypofunction: Secondary | ICD-10-CM

## 2011-08-10 DIAGNOSIS — E785 Hyperlipidemia, unspecified: Secondary | ICD-10-CM

## 2011-08-10 DIAGNOSIS — E119 Type 2 diabetes mellitus without complications: Secondary | ICD-10-CM

## 2011-08-10 LAB — BASIC METABOLIC PANEL
BUN: 20 mg/dL (ref 6–23)
CO2: 27 mEq/L (ref 19–32)
Calcium: 9.4 mg/dL (ref 8.4–10.5)
Creatinine, Ser: 1.5 mg/dL (ref 0.4–1.5)
Glucose, Bld: 102 mg/dL — ABNORMAL HIGH (ref 70–99)

## 2011-08-10 LAB — HEPATIC FUNCTION PANEL
Albumin: 3.8 g/dL (ref 3.5–5.2)
Bilirubin, Direct: 0.1 mg/dL (ref 0.0–0.3)
Total Protein: 6.5 g/dL (ref 6.0–8.3)

## 2011-08-10 LAB — LIPID PANEL
Cholesterol: 182 mg/dL (ref 0–200)
Total CHOL/HDL Ratio: 7
Triglycerides: 325 mg/dL — ABNORMAL HIGH (ref 0.0–149.0)
VLDL: 65 mg/dL — ABNORMAL HIGH (ref 0.0–40.0)

## 2011-08-10 LAB — TESTOSTERONE: Testosterone: 312.14 ng/dL — ABNORMAL LOW (ref 350.00–890.00)

## 2011-08-14 ENCOUNTER — Other Ambulatory Visit: Payer: Self-pay | Admitting: Endocrinology

## 2011-08-14 ENCOUNTER — Encounter: Payer: Self-pay | Admitting: Endocrinology

## 2011-08-14 ENCOUNTER — Ambulatory Visit: Payer: BC Managed Care – PPO | Admitting: Internal Medicine

## 2011-08-14 DIAGNOSIS — E291 Testicular hypofunction: Secondary | ICD-10-CM

## 2011-08-14 MED ORDER — CLOMIPHENE CITRATE 50 MG PO TABS: ORAL_TABLET | ORAL | Status: DC

## 2011-08-15 ENCOUNTER — Telehealth: Payer: Self-pay | Admitting: *Deleted

## 2011-08-15 DIAGNOSIS — E291 Testicular hypofunction: Secondary | ICD-10-CM

## 2011-08-15 NOTE — Telephone Encounter (Signed)
Called pt to inform of testosterone results, left message for pt to callback office (letter also mailed to pt).

## 2011-08-16 ENCOUNTER — Encounter: Payer: Self-pay | Admitting: Internal Medicine

## 2011-08-16 ENCOUNTER — Ambulatory Visit (INDEPENDENT_AMBULATORY_CARE_PROVIDER_SITE_OTHER): Payer: BC Managed Care – PPO | Admitting: Internal Medicine

## 2011-08-16 ENCOUNTER — Telehealth: Payer: Self-pay | Admitting: Internal Medicine

## 2011-08-16 VITALS — BP 112/80 | HR 73 | Temp 98.3°F | Resp 18 | Wt 262.0 lb

## 2011-08-16 DIAGNOSIS — E785 Hyperlipidemia, unspecified: Secondary | ICD-10-CM

## 2011-08-16 DIAGNOSIS — E119 Type 2 diabetes mellitus without complications: Secondary | ICD-10-CM

## 2011-08-16 DIAGNOSIS — E663 Overweight: Secondary | ICD-10-CM

## 2011-08-16 DIAGNOSIS — R609 Edema, unspecified: Secondary | ICD-10-CM

## 2011-08-16 MED ORDER — ATORVASTATIN CALCIUM 20 MG PO TABS: 20.0000 mg | ORAL_TABLET | Freq: Every day | ORAL | Status: DC

## 2011-08-16 MED ORDER — FUROSEMIDE 20 MG PO TABS: ORAL_TABLET | ORAL | Status: DC

## 2011-08-16 NOTE — Telephone Encounter (Signed)
Lab order entered for August 2013. 

## 2011-08-16 NOTE — Patient Instructions (Signed)
Please schedule fasting labs prior to next appointment Chem7, a1c, urine microalbumin-250.0 and lipid/lft-272.4 ([facility] lab)

## 2011-08-16 NOTE — Telephone Encounter (Signed)
Left message for pt to callback office.  

## 2011-08-17 NOTE — Telephone Encounter (Signed)
Pt informed of lab results. 

## 2011-08-18 NOTE — Assessment & Plan Note (Signed)
Excellent control. Diabetic diet, exercise and wt loss

## 2011-08-18 NOTE — Progress Notes (Signed)
Subjective:    Patient ID: Daniel [name], male    DOB: 1956-03-12, 56 y.o.   MRN: [id number]  HPI Pt presents to clinic for followup of multiple medical problems. a1c significantly improve with diet change and wt loss. Wt down 7lbs since last visit. Notes intermittent mild edema of extremities. Seeing psychiatrist who changed concerta to vyvanse. Reviewed ldl improved but suboptimal.   Past Medical History  Diagnosis Date  . Depression   . Anxiety   . Hyperlipidemia   . Hypertension   . COPD (chronic obstructive pulmonary disease)   . Sleep apnea   . Obesity    No past surgical history on file.  reports that he has been smoking Cigarettes.  He has a 20 pack-year smoking history. He has never used smokeless tobacco. He reports that he drinks alcohol. His drug history not on file. family history includes Cancer in his mother; Emphysema in his father; Heart disease in his father; Hyperlipidemia in his father; Hypertension in his father; and Stroke in his father. No Known Allergies    Review of Systems see hpi     Objective:   Physical Exam  Nursing note and vitals reviewed. Constitutional: He appears well-developed and well-nourished.          Assessment & Plan:

## 2011-08-18 NOTE — Assessment & Plan Note (Signed)
Attempt lasix prn. Anticipate will resolve with further weight loss

## 2011-08-18 NOTE — Assessment & Plan Note (Signed)
Encouraged in wt loss efforts. Begin regular exercise program.

## 2011-08-18 NOTE — Assessment & Plan Note (Signed)
Increase lipitor 20mg  qd.

## 2011-08-21 ENCOUNTER — Encounter: Payer: Self-pay | Admitting: Internal Medicine

## 2011-08-21 ENCOUNTER — Ambulatory Visit (INDEPENDENT_AMBULATORY_CARE_PROVIDER_SITE_OTHER): Payer: BC Managed Care – PPO | Admitting: Internal Medicine

## 2011-08-21 VITALS — BP 118/80 | HR 99 | Temp 98.2°F | Resp 18

## 2011-08-21 DIAGNOSIS — L729 Follicular cyst of the skin and subcutaneous tissue, unspecified: Secondary | ICD-10-CM

## 2011-08-21 DIAGNOSIS — L723 Sebaceous cyst: Secondary | ICD-10-CM

## 2011-08-21 DIAGNOSIS — N419 Inflammatory disease of prostate, unspecified: Secondary | ICD-10-CM

## 2011-08-21 MED ORDER — LEVOFLOXACIN 500 MG PO TABS: 500.0000 mg | ORAL_TABLET | Freq: Every day | ORAL | Status: AC

## 2011-08-22 DIAGNOSIS — N400 Enlarged prostate without lower urinary tract symptoms: Secondary | ICD-10-CM | POA: Insufficient documentation

## 2011-08-22 LAB — URINALYSIS, ROUTINE W REFLEX MICROSCOPIC
Bilirubin Urine: NEGATIVE
Ketones, ur: NEGATIVE mg/dL
Protein, ur: NEGATIVE mg/dL
Urobilinogen, UA: 1 mg/dL (ref 0.0–1.0)

## 2011-08-22 LAB — URINALYSIS, MICROSCOPIC ONLY
Bacteria, UA: NONE SEEN
Casts: NONE SEEN

## 2011-08-22 NOTE — Assessment & Plan Note (Signed)
Obtain ua and urine cx. Begin abx tx. Followup if no improvement or worsening.  

## 2011-08-22 NOTE — Progress Notes (Signed)
Subjective:    Patient ID: Daniel [name], male    DOB: 08-Apr-1955, 56 y.o.   MRN: [id number]  HPI Pt presents to clinic for evaluation of possible scrotal abscess. 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 Notes one day h/o ST mass of lower scrotum. No drainage, fever or chills. Also notes 10d h/o nocturia, urinary frequency and slower stream. No hematuria.   Past Medical History  Diagnosis Date  . Depression   . Anxiety   . Hyperlipidemia   . Hypertension   . COPD (chronic obstructive pulmonary disease)   . Sleep apnea   . Obesity    No past surgical history on file.  reports that he has been smoking Cigarettes.  He has a 20 pack-year smoking history. He has never used smokeless tobacco. He reports that he drinks alcohol. His drug history not on file. family history includes Cancer in his mother; Emphysema in his father; Heart disease in his father; Hyperlipidemia in his father; Hypertension in his father; and Stroke in his father. No Known Allergies   Review of Systems see hpi     Objective:   Physical Exam  Nursing note and vitals reviewed. Constitutional: He appears well-developed and well-nourished. No distress.  HENT:  Head: Normocephalic and atraumatic.  Genitourinary:       Lower scrotum ST mass ~1cm. Nontender. No drainage.  Neurological: He is alert.  Skin: Skin is warm and dry. He is not diaphoretic.  Psychiatric: He has a normal mood and affect.          Assessment & Plan:

## 2011-08-22 NOTE — Assessment & Plan Note (Signed)
Recurrent. Begin abx tx. Urology if persists or worsens

## 2011-09-05 ENCOUNTER — Telehealth: Payer: Self-pay | Admitting: Internal Medicine

## 2011-09-05 MED ORDER — DIAZEPAM 5 MG PO TABS: 5.0000 mg | ORAL_TABLET | Freq: Three times a day (TID) | ORAL | Status: DC | PRN

## 2011-09-05 NOTE — Telephone Encounter (Signed)
Per Dr Rodena Medin approval verbal refill provided to pharmacist.

## 2011-09-05 NOTE — Telephone Encounter (Signed)
Refill-diazepam 5mg  tablet. Take one tablet by mouth every 8 hours as needed for anxiety. Last fill 5.1.13

## 2011-09-05 NOTE — Telephone Encounter (Signed)
Ok rf3 

## 2011-09-09 ENCOUNTER — Other Ambulatory Visit: Payer: Self-pay | Admitting: Internal Medicine

## 2011-09-10 NOTE — Telephone Encounter (Signed)
Last refill of zolpidem 10mg  #30 was 05/10/11 x 3 refills. Pt last filled med on 08/10/11 and has f/u in August. How many refills may we give?

## 2011-09-10 NOTE — Telephone Encounter (Signed)
rf3 please

## 2011-09-10 NOTE — Telephone Encounter (Signed)
Verbal refill provided to pharmacist per Dr Rodena Medin approval.

## 2011-09-16 ENCOUNTER — Other Ambulatory Visit: Payer: Self-pay | Admitting: Internal Medicine

## 2011-09-25 ENCOUNTER — Other Ambulatory Visit: Payer: Self-pay | Admitting: *Deleted

## 2011-09-25 MED ORDER — LISDEXAMFETAMINE DIMESYLATE 70 MG PO CAPS: 70.0000 mg | ORAL_CAPSULE | ORAL | Status: DC

## 2011-09-25 NOTE — Telephone Encounter (Signed)
Left message to call office

## 2011-09-25 NOTE — Telephone Encounter (Signed)
Discuss with patient,Rx ready for pick up 

## 2011-09-25 NOTE — Telephone Encounter (Signed)
Pt states that his psychiatrist is in [place] and it is about 3 hours away. Pt would like to know if you could fill this med for him which would save him a drive.Please advise

## 2011-09-25 NOTE — Telephone Encounter (Signed)
Last OV 08-21-11, no history of refills. Please advise

## 2011-09-25 NOTE — Telephone Encounter (Signed)
Will do it this time for one month. pls print

## 2011-09-25 NOTE — Telephone Encounter (Signed)
i thought his specialist/psychiatrist placed him on it and was filling it.

## 2011-11-06 LAB — HEPATIC FUNCTION PANEL
Alkaline Phosphatase: 54 U/L (ref 39–117)
Indirect Bilirubin: 0.3 mg/dL (ref 0.0–0.9)
Total Bilirubin: 0.4 mg/dL (ref 0.3–1.2)
Total Protein: 6.7 g/dL (ref 6.0–8.3)

## 2011-11-06 LAB — HEMOGLOBIN A1C
Hgb A1c MFr Bld: 6 % — ABNORMAL HIGH (ref <5.7)
Mean Plasma Glucose: 126 mg/dL — ABNORMAL HIGH (ref <117)

## 2011-11-06 LAB — LIPID PANEL
HDL: 28 mg/dL — ABNORMAL LOW (ref >39)
LDL Cholesterol: 116 mg/dL — ABNORMAL HIGH (ref 0–99)
Total CHOL/HDL Ratio: 7 Ratio
Triglycerides: 260 mg/dL — ABNORMAL HIGH (ref <150)

## 2011-11-06 LAB — BASIC METABOLIC PANEL
BUN: 15 mg/dL (ref 6–23)
Chloride: 107 mEq/L (ref 96–112)
Potassium: 4.8 mEq/L (ref 3.5–5.3)
Sodium: 143 mEq/L (ref 135–145)

## 2011-11-06 NOTE — Addendum Note (Signed)
Addended by: Regis Bill on: 11/06/2011 02:16 PM   Modules accepted: Orders

## 2011-11-06 NOTE — Telephone Encounter (Signed)
Lab orders released/SLS 

## 2011-11-07 LAB — MICROALBUMIN / CREATININE URINE RATIO: Microalb, Ur: 0.73 mg/dL (ref 0.00–1.89)

## 2011-11-11 ENCOUNTER — Other Ambulatory Visit: Payer: Self-pay | Admitting: Internal Medicine

## 2011-11-12 ENCOUNTER — Ambulatory Visit: Payer: BC Managed Care – PPO | Admitting: Internal Medicine

## 2011-11-12 NOTE — Telephone Encounter (Signed)
Wellbutrin request [Last Rx 08.14.13 #30x11]/SLS Please advise.

## 2011-11-16 ENCOUNTER — Ambulatory Visit: Payer: BC Managed Care – PPO | Admitting: Internal Medicine

## 2011-11-20 ENCOUNTER — Ambulatory Visit (INDEPENDENT_AMBULATORY_CARE_PROVIDER_SITE_OTHER): Payer: BC Managed Care – PPO | Admitting: Family

## 2011-11-20 ENCOUNTER — Encounter: Payer: Self-pay | Admitting: Family

## 2011-11-20 VITALS — BP 124/78 | HR 74 | Temp 98.5°F | Resp 16 | Ht 72.01 in | Wt 263.0 lb

## 2011-11-20 DIAGNOSIS — E785 Hyperlipidemia, unspecified: Secondary | ICD-10-CM

## 2011-11-20 DIAGNOSIS — F341 Dysthymic disorder: Secondary | ICD-10-CM

## 2011-11-20 DIAGNOSIS — I10 Essential (primary) hypertension: Secondary | ICD-10-CM

## 2011-11-20 DIAGNOSIS — E119 Type 2 diabetes mellitus without complications: Secondary | ICD-10-CM

## 2011-11-20 MED ORDER — ASPIRIN EC 81 MG PO TBEC: 81.0000 mg | DELAYED_RELEASE_TABLET | Freq: Every day | ORAL | Status: AC

## 2011-11-20 MED ORDER — METFORMIN HCL 500 MG PO TABS: 500.0000 mg | ORAL_TABLET | Freq: Every day | ORAL | Status: DC

## 2011-11-20 MED ORDER — ZOLPIDEM TARTRATE 10 MG PO TABS: 10.0000 mg | ORAL_TABLET | Freq: Every evening | ORAL | Status: DC | PRN

## 2011-11-20 MED ORDER — FUROSEMIDE 20 MG PO TABS: ORAL_TABLET | ORAL | Status: DC

## 2011-11-20 MED ORDER — ATORVASTATIN CALCIUM 20 MG PO TABS: 20.0000 mg | ORAL_TABLET | Freq: Every day | ORAL | Status: DC

## 2011-11-20 MED ORDER — DIAZEPAM 5 MG PO TABS: 5.0000 mg | ORAL_TABLET | Freq: Three times a day (TID) | ORAL | Status: DC | PRN

## 2011-11-20 MED ORDER — LISDEXAMFETAMINE DIMESYLATE 70 MG PO CAPS: 70.0000 mg | ORAL_CAPSULE | ORAL | Status: DC

## 2011-11-20 MED ORDER — NEBIVOLOL HCL 5 MG PO TABS: 5.0000 mg | ORAL_TABLET | Freq: Two times a day (BID) | ORAL | Status: DC

## 2011-11-20 MED ORDER — DULOXETINE HCL 30 MG PO CPEP: 90.0000 mg | ORAL_CAPSULE | Freq: Every day | ORAL | Status: DC

## 2011-11-20 MED ORDER — BUPROPION HCL ER (XL) 150 MG PO TB24: 150.0000 mg | ORAL_TABLET | Freq: Every day | ORAL | Status: DC

## 2011-11-20 MED ORDER — BUPROPION HCL ER (XL) 300 MG PO TB24: 300.0000 mg | ORAL_TABLET | Freq: Every day | ORAL | Status: DC

## 2011-11-20 MED ORDER — FENOFIBRATE 145 MG PO TABS: 145.0000 mg | ORAL_TABLET | Freq: Every day | ORAL | Status: DC

## 2011-11-20 NOTE — Assessment & Plan Note (Signed)
Triglycerides are not at goal.  We discussed avoiding white fluffy carbs and concentrated sweets. Continue atorvastatin. Will increase tricor to try to bring trigs down to goal.

## 2011-11-20 NOTE — Assessment & Plan Note (Signed)
Well controlled.  Pt upset that I could not provide refills on his controlled substances (per NP rules).  I advised him to call us before they run out and we can call them in.  He will need to give [place] at least 1 week notice re: the vyvanse as this prescription will likely need to be mailed to his [place] address.

## 2011-11-20 NOTE — Patient Instructions (Addendum)
Please follow up with Dr. Hodgin in 3 months.   

## 2011-11-20 NOTE — Assessment & Plan Note (Signed)
A1C at goal, continue metformin.

## 2011-11-20 NOTE — Progress Notes (Signed)
Subjective:    Patient ID: Daniel [name], male    DOB: Jan 09, 1956, 56 y.o.   MRN: [id number]  HPI  Mr.  Nannini is a 56 yr old male who presents today for follow up.  1) DM2-  A1C 6.0, urine microalbumin neg.  On metformin.  2) Hyperlipidimia-  On atorvastatin and tricor.  Triglycerides elevated at 260, but down from 325.  3) HTN- Currently maintained on bystolic 5mg  once daily.  He does get swelling in his hands.  Has not been using furosemide recently as it "only helps for a few days."  4) Depression- pt is maintained on wellbutrin and cymbalta and notes that his depression is well controlled.     Review of Systems Past Medical History  Diagnosis Date  . Depression   . Anxiety   . Hyperlipidemia   . Hypertension   . COPD (chronic obstructive pulmonary disease)   . Sleep apnea   . Obesity     History   Social History  . Marital Status: Single    Spouse Name: N/A    Number of Children: N/A  . Years of Education: N/A   Occupational History  . Not on file.   Social History Main Topics  . Smoking status: Current Everyday Smoker -- 0.5 packs/day for 40 years    Types: Cigarettes  . Smokeless tobacco: Never Used   Comment: currently smoking 1/2 ppd.    . Alcohol Use: Yes     once a month, glass of wine  . Drug Use: Not on file  . Sexually Active: Not on file   Other Topics Concern  . Not on file   Social History Narrative   Regular exercise-no    No past surgical history on file.  Family History  Problem Relation Age of Onset  . Cancer Mother     ovarian  . Hyperlipidemia Father   . Hypertension Father   . Stroke Father   . Heart disease Father   . Emphysema Father     No Known Allergies  Current Outpatient Prescriptions on File Prior to Visit  Medication Sig Dispense Refill  . atorvastatin (LIPITOR) 20 MG tablet Take 1 tablet (20 mg total) by mouth daily.  30 tablet  6  . buPROPion (WELLBUTRIN XL) 150 MG 24 hr tablet take 1 tablet by mouth once  daily  30 tablet  7  . buPROPion (WELLBUTRIN XL) 300 MG 24 hr tablet take 1 tablet by mouth once daily  30 tablet  11  . BYSTOLIC 5 MG tablet take 1 tablet by mouth twice a day  60 tablet  1  . clobetasol (TEMOVATE) 0.05 % external solution USE TO SCALP AS DIRECTED  50 mL  6  . clomiPHENE (CLOMID) 50 MG tablet 1/2 tab daily  15 tablet  11  . diazepam (VALIUM) 5 MG tablet Take 1 tablet (5 mg total) by mouth every 8 (eight) hours as needed for anxiety.  90 tablet  3  . DULoxetine (CYMBALTA) 30 MG capsule Take 120 mg by mouth daily.       . fenofibrate (TRICOR) 48 MG tablet Take 1 tablet (48 mg total) by mouth daily.  30 tablet  6  . furosemide (LASIX) 20 MG tablet One by mouth once a day as needed for swelling  30 tablet  3  . lisdexamfetamine (VYVANSE) 70 MG capsule Take 1 capsule (70 mg total) by mouth every morning.  30 capsule  0  . metFORMIN (GLUCOPHAGE) 500  MG tablet Take 1 tablet (500 mg total) by mouth daily with breakfast.  30 tablet  6  . zolpidem (AMBIEN) 10 MG tablet take 1 tablet by mouth at bedtime if needed  30 tablet  3  . DISCONTD: testosterone cypionate (DEPO-TESTOSTERONE) 200 MG/ML injection Inject 100 mg into the muscle once a week.          BP 124/78  Pulse 74  Temp 98.5 F (36.9 C) (Oral)  Resp 16  Ht 6' 0.01" (1.829 m)  Wt 263 lb 0.6 oz (119.314 kg)  BMI 35.67 kg/m2  SpO2 96%       Objective:   Physical Exam  Constitutional: He is oriented to person, place, and time. He appears well-developed and well-nourished. No distress.  Cardiovascular: Normal rate and regular rhythm.   No murmur heard. Pulmonary/Chest: Effort normal and breath sounds normal. No respiratory distress. He has no wheezes. He has no rales. He exhibits no tenderness.  Abdominal: Soft. Bowel sounds are normal.  Musculoskeletal: He exhibits no edema.  Neurological: He is alert and oriented to person, place, and time.  Skin: Skin is warm and dry. No rash noted. No erythema. No pallor.    Psychiatric: He has a normal mood and affect. His behavior is normal. Judgment and thought content normal.          Assessment & Plan:

## 2011-11-21 DIAGNOSIS — I10 Essential (primary) hypertension: Secondary | ICD-10-CM | POA: Insufficient documentation

## 2011-11-21 NOTE — Assessment & Plan Note (Signed)
bp is stable on bystolic.  Continue same.

## 2011-12-11 ENCOUNTER — Telehealth: Payer: Self-pay | Admitting: Internal Medicine

## 2011-12-11 NOTE — Telephone Encounter (Signed)
Recived generic PA form from [employer] for patient; awaiting approval of completion & signature from MD to fax/SLS

## 2011-12-11 NOTE — Telephone Encounter (Signed)
Patient states that he is having trouble refilling cymbalta medication. [employer] [place] would like a nurse to call them for prior authorization. [phone]. Patient requests [place] to call him when after we talk to [facility]

## 2011-12-13 NOTE — Telephone Encounter (Signed)
PA form faxed to [facility] 09.24.13; awaiting response/SLS

## 2011-12-19 NOTE — Telephone Encounter (Signed)
Phoned [facility] pharmacy to inquire as to whether they had recvd response from [employer] RE: prior authorization for Cymbalta and/or pt has filled this medication. Pharmacist checked medication status in system & this is not a Prior authorization request; this is not covered by pt's Insurance d/t "plan limit exceeded" [d/t amount of medication taken]/SLS Will call [employer] for further options.

## 2011-12-21 NOTE — Telephone Encounter (Signed)
New migration with [employer] and they been having trouble receiving faxes and/or receiving faxes in a timely manner per Toni Amend [representative]; had physician listed as Dr. Wyvonnia Lora, had patient's phone number listed as a 404 area code number--will have to send to director for final response--will send fax w/i 72 business hrs--Case #c [id number]/SLS

## 2011-12-25 NOTE — Telephone Encounter (Signed)
Patient called back requesting the status on this. He states that he would like a call back on this. Best # [phone]

## 2011-12-26 NOTE — Telephone Encounter (Signed)
Pt advised that [employer] has been contacted and that original PA was not received. PA was resent 10/04 and Jasmine December was told that it would take 72 hrs hours for processing - status has not been received as of this time. Pt to call back with direct # of representative that he contacted for possible PA completion via phone.

## 2012-01-07 NOTE — Telephone Encounter (Signed)
Received Approval on Prior Authorization via [employer] [[employer]] for patient's Cymbalta, effective 10.07.13 until 10.08.14; Faxed to pt's pharmacy [[facility] [phone]]/SLS

## 2012-01-11 ENCOUNTER — Other Ambulatory Visit: Payer: Self-pay | Admitting: Internal Medicine

## 2012-01-11 NOTE — Telephone Encounter (Signed)
Medication refill request. Ambien 10 mg, po at bedtime as needed for sleep. #30 0 rf. Last office visit 11/20/11. No future appt scheduled. Please advise.

## 2012-01-11 NOTE — Telephone Encounter (Signed)
#  30 rf2 

## 2012-02-13 ENCOUNTER — Telehealth: Payer: Self-pay | Admitting: Internal Medicine

## 2012-02-13 MED ORDER — DIAZEPAM 5 MG PO TABS: 5.0000 mg | ORAL_TABLET | Freq: Three times a day (TID) | ORAL | Status: DC | PRN

## 2012-02-13 NOTE — Telephone Encounter (Signed)
Refill- diazepam 5mg  tab. Take one tablet by mouth every 8 hours as needed for anxiety. Last fill 10.22.13

## 2012-02-13 NOTE — Telephone Encounter (Signed)
Please advise in Dr. Ilda Foil absence/SLS

## 2012-02-13 NOTE — Telephone Encounter (Signed)
OK to send #90 with zero refills.  

## 2012-02-13 NOTE — Telephone Encounter (Signed)
Rx phoned to pharmacy/SLS 

## 2012-02-27 ENCOUNTER — Encounter: Payer: Self-pay | Admitting: Family

## 2012-02-27 ENCOUNTER — Ambulatory Visit (INDEPENDENT_AMBULATORY_CARE_PROVIDER_SITE_OTHER): Payer: BC Managed Care – PPO | Admitting: Family

## 2012-02-27 ENCOUNTER — Ambulatory Visit (HOSPITAL_BASED_OUTPATIENT_CLINIC_OR_DEPARTMENT_OTHER)
Admission: RE | Admit: 2012-02-27 | Discharge: 2012-02-27 | Disposition: A | Discharge status: Home / Self Care | Payer: BC Managed Care – PPO | Source: Ambulatory Visit | Attending: Family | Admitting: Family

## 2012-02-27 VITALS — BP 100/70 | HR 73 | Temp 97.6°F | Resp 16 | Wt 255.1 lb

## 2012-02-27 DIAGNOSIS — R2681 Unsteadiness on feet: Secondary | ICD-10-CM | POA: Insufficient documentation

## 2012-02-27 DIAGNOSIS — R269 Unspecified abnormalities of gait and mobility: Secondary | ICD-10-CM | POA: Insufficient documentation

## 2012-02-27 DIAGNOSIS — R51 Headache: Secondary | ICD-10-CM | POA: Insufficient documentation

## 2012-02-27 LAB — SEDIMENTATION RATE: Sed Rate: 1 mm/hr (ref 0–16)

## 2012-02-27 MED ORDER — KETOROLAC TROMETHAMINE 30 MG/ML IJ SOLN
30.0000 mg | Freq: Once | INTRAMUSCULAR | Status: AC
  Administered 2012-02-27: 30 mg via INTRAMUSCULAR

## 2012-02-27 NOTE — Progress Notes (Signed)
Subjective:    Patient ID: Daniel [name], male    DOB: 08-08-1955, 56 y.o.   MRN: [id number]  HPI  Daniel [name] is a 56 yr old male who presents today with complaint of balance problem.  He reports feeling off balance when rising from a horizontal position x 5 days. Denies associated dizziness or  extremity weakness. Reports that it may take him 20-30 minutes to recover when trying to stand in the mornings.  Generally he "Falls toward the left." Has to brace himself with his hands. Denies fever, sinus congestion, cough/cold symptoms.  He does report + HA- throbbing, comes and goes x 5 days.    Review of Systems See HPI  Past Medical History  Diagnosis Date  . Depression   . Anxiety   . Hyperlipidemia   . Hypertension   . COPD (chronic obstructive pulmonary disease)   . Sleep apnea   . Obesity     History   Social History  . Marital Status: Single    Spouse Name: N/A    Number of Children: N/A  . Years of Education: N/A   Occupational History  . Not on file.   Social History Main Topics  . Smoking status: Current Every Day Smoker -- 0.5 packs/day for 40 years    Types: Cigarettes  . Smokeless tobacco: Never Used     Comment: currently smoking 1/2 ppd.    . Alcohol Use: Yes     Comment: once a month, glass of wine  . Drug Use: Not on file  . Sexually Active: Not on file   Other Topics Concern  . Not on file   Social History Narrative   Regular exercise-no    No past surgical history on file.  Family History  Problem Relation Age of Onset  . Cancer Mother     ovarian  . Hyperlipidemia Father   . Hypertension Father   . Stroke Father   . Heart disease Father   . Emphysema Father     No Known Allergies  Current Outpatient Prescriptions on File Prior to Visit  Medication Sig Dispense Refill  . aspirin EC 81 MG tablet Take 1 tablet (81 mg total) by mouth daily.      Marland Kitchen atorvastatin (LIPITOR) 20 MG tablet Take 1 tablet (20 mg total) by mouth daily.  30 tablet   2  . buPROPion (WELLBUTRIN XL) 150 MG 24 hr tablet Take 1 tablet (150 mg total) by mouth daily.  30 tablet  2  . buPROPion (WELLBUTRIN XL) 300 MG 24 hr tablet Take 1 tablet (300 mg total) by mouth daily.  30 tablet  2  . clobetasol (TEMOVATE) 0.05 % external solution USE TO SCALP AS DIRECTED  50 mL  6  . clomiPHENE (CLOMID) 50 MG tablet 1/2 tab daily  15 tablet  11  . diazepam (VALIUM) 5 MG tablet Take 1 tablet (5 mg total) by mouth every 8 (eight) hours as needed for anxiety.  90 tablet  0  . DULoxetine (CYMBALTA) 30 MG capsule Take 3 capsules (90 mg total) by mouth daily.  90 capsule  2  . fenofibrate (TRICOR) 145 MG tablet Take 1 tablet (145 mg total) by mouth daily.  30 tablet  2  . furosemide (LASIX) 20 MG tablet One by mouth once a day as needed for swelling  30 tablet  2  . lisdexamfetamine (VYVANSE) 70 MG capsule Take 1 capsule (70 mg total) by mouth every morning.  30 capsule  0  . metFORMIN (GLUCOPHAGE) 500 MG tablet Take 1 tablet (500 mg total) by mouth daily with breakfast.  30 tablet  3  . nebivolol (BYSTOLIC) 5 MG tablet Take 1 tablet (5 mg total) by mouth 2 (two) times daily.  60 tablet  2  . zolpidem (AMBIEN) 10 MG tablet take 1 tablet by mouth at bedtime if needed  30 tablet  2  . [DISCONTINUED] testosterone cypionate (DEPO-TESTOSTERONE) 200 MG/ML injection Inject 100 mg into the muscle once a week.          BP 100/70  Pulse 73  Temp 97.6 F (36.4 C) (Oral)  Resp 16  Wt 255 lb 1.3 oz (115.704 kg)  SpO2 97%       Objective:   Physical Exam  Constitutional: He is oriented to person, place, and time. He appears well-developed and well-nourished. No distress.  Cardiovascular: Normal rate and regular rhythm.   No murmur heard. Pulmonary/Chest: Effort normal and breath sounds normal. No respiratory distress. He has no wheezes. He has no rales. He exhibits no tenderness.  Neurological: He is alert and oriented to person, place, and time.       PERRLA, strong equal  bilateral hand grasps. Bilateral UE/LE strength is 5/5.  Steady even gait noted.  +facial symmetry. EOM intact with slight lateral nystagmus  Psychiatric: His behavior is normal. Judgment and thought content normal.       Flat affect          Assessment & Plan:

## 2012-02-27 NOTE — Assessment & Plan Note (Signed)
BP is low, but no significant orthostasis.  Suspect vertigo, but will try holding his bystolic first.  I have outlined parameters for him- see AVS.  Will obtain CT head today to exclude CVA.  Due to HA will obtain ESR to exclude Temporal arteritis.  If work up negative and symptoms do not improve, consider trial of meclizine. Follow up in 1 week.  He requested "something now for my headache" and was given a dose of IM toradol in the office.

## 2012-02-27 NOTE — Patient Instructions (Addendum)
Please complete your blood work prior to leaving.  Hold bystolic and check blood pressure once daily.  Do not take unless BP is > 150/90. Complete your CT on the first floor. Follow up with Dr. Rodena Medin in 1 week. Go to the ER if you develop numbness, weakness, worsening problems with balance, facial drooping or problems with your vision.

## 2012-02-28 ENCOUNTER — Encounter: Payer: Self-pay | Admitting: Family

## 2012-03-06 ENCOUNTER — Ambulatory Visit (INDEPENDENT_AMBULATORY_CARE_PROVIDER_SITE_OTHER): Payer: BC Managed Care – PPO | Admitting: Internal Medicine

## 2012-03-06 ENCOUNTER — Encounter: Payer: Self-pay | Admitting: Internal Medicine

## 2012-03-06 VITALS — BP 108/78 | HR 72 | Temp 98.2°F | Resp 16 | Wt 252.5 lb

## 2012-03-06 DIAGNOSIS — E119 Type 2 diabetes mellitus without complications: Secondary | ICD-10-CM

## 2012-03-06 DIAGNOSIS — I10 Essential (primary) hypertension: Secondary | ICD-10-CM

## 2012-03-06 DIAGNOSIS — E785 Hyperlipidemia, unspecified: Secondary | ICD-10-CM

## 2012-03-06 LAB — CBC WITH DIFFERENTIAL/PLATELET
Eosinophils Relative: 3 % (ref 0–5)
HCT: 46.2 % (ref 39.0–52.0)
Hemoglobin: 16.3 g/dL (ref 13.0–17.0)
Lymphocytes Relative: 34 % (ref 12–46)
MCHC: 35.3 g/dL (ref 30.0–36.0)
MCV: 87.7 fL (ref 78.0–100.0)
Monocytes Absolute: 0.6 10*3/uL (ref 0.1–1.0)
Monocytes Relative: 8 % (ref 3–12)
Neutro Abs: 4.3 10*3/uL (ref 1.7–7.7)
RDW: 14 % (ref 11.5–15.5)
WBC: 7.8 10*3/uL (ref 4.0–10.5)

## 2012-03-06 LAB — BASIC METABOLIC PANEL
BUN: 15 mg/dL (ref 6–23)
CO2: 28 mEq/L (ref 19–32)
Chloride: 105 mEq/L (ref 96–112)
Creat: 1.2 mg/dL (ref 0.50–1.35)
Glucose, Bld: 80 mg/dL (ref 70–99)
Potassium: 4.8 mEq/L (ref 3.5–5.3)

## 2012-03-06 LAB — LIPID PANEL
HDL: 37 mg/dL — ABNORMAL LOW (ref >39)
LDL Cholesterol: 109 mg/dL — ABNORMAL HIGH (ref 0–99)
Total CHOL/HDL Ratio: 5.1 Ratio
VLDL: 44 mg/dL — ABNORMAL HIGH (ref 0–40)

## 2012-03-06 LAB — HEPATIC FUNCTION PANEL
AST: 19 U/L (ref 0–37)
Albumin: 4.5 g/dL (ref 3.5–5.2)
Bilirubin, Direct: 0.1 mg/dL (ref 0.0–0.3)
Total Bilirubin: 0.5 mg/dL (ref 0.3–1.2)

## 2012-03-06 MED ORDER — LISDEXAMFETAMINE DIMESYLATE 70 MG PO CAPS: 70.0000 mg | ORAL_CAPSULE | ORAL | Status: DC

## 2012-03-11 ENCOUNTER — Other Ambulatory Visit: Payer: Self-pay | Admitting: *Deleted

## 2012-03-11 ENCOUNTER — Telehealth: Payer: Self-pay | Admitting: Internal Medicine

## 2012-03-11 MED ORDER — FENOFIBRATE 145 MG PO TABS: 145.0000 mg | ORAL_TABLET | Freq: Every day | ORAL | Status: DC

## 2012-03-11 NOTE — Telephone Encounter (Signed)
Refill fenofibrate 48mg  tablet qty 30 take 1 tablet by mouth every day last fill 01-28-2012

## 2012-03-11 NOTE — Telephone Encounter (Signed)
Rx to pharmacy/SLS 

## 2012-03-14 ENCOUNTER — Telehealth: Payer: Self-pay | Admitting: *Deleted

## 2012-03-14 DIAGNOSIS — E785 Hyperlipidemia, unspecified: Secondary | ICD-10-CM

## 2012-03-14 MED ORDER — ATORVASTATIN CALCIUM 40 MG PO TABS: 40.0000 mg | ORAL_TABLET | Freq: Every day | ORAL | Status: DC

## 2012-03-14 NOTE — Progress Notes (Signed)
Subjective:    Patient ID: Daniel [name], male    DOB: 09-21-1955, 56 y.o.   MRN: [id number]  HPI Pt presents to clinic for followup of multiple medical problems. Recent unsteadiness resolved after holding bystolic. Evaluation was unrevealing. Due for follow up of dm and hyperlipidemia. No active complaints.  Past Medical History  Diagnosis Date  . Depression   . Anxiety   . Hyperlipidemia   . Hypertension   . COPD (chronic obstructive pulmonary disease)   . Sleep apnea   . Obesity    No past surgical history on file.  reports that he has been smoking Cigarettes.  He has a 20 pack-year smoking history. He has never used smokeless tobacco. He reports that he drinks alcohol. His drug history not on file. family history includes Cancer in his mother; Emphysema in his father; Heart disease in his father; Hyperlipidemia in his father; Hypertension in his father; and Stroke in his father. No Known Allergies    Review of Systems see hpi     Objective:   Physical Exam  Nursing note and vitals reviewed. Constitutional: He appears well-developed and well-nourished. No distress.  Skin: He is not diaphoretic.          Assessment & Plan:

## 2012-03-14 NOTE — Telephone Encounter (Signed)
Pt informed, understood & agreed; New Rx to pharmacy, future lab orders placed/SLS

## 2012-03-14 NOTE — Assessment & Plan Note (Signed)
Obtain chem7 and a1c 

## 2012-03-14 NOTE — Telephone Encounter (Signed)
Message copied by Regis Bill on Fri Mar 14, 2012  4:36 PM ------      Message from: Edwyna Perfect      Created: Thu Mar 13, 2012 12:40 PM       Chol remaining high. Suggest increase lipitor from 20mg  to 40mg  qd. Recheck lipid/lft in 4 wks 272.4

## 2012-03-14 NOTE — Assessment & Plan Note (Signed)
Obtain lipid/lft. 

## 2012-03-14 NOTE — Assessment & Plan Note (Signed)
Resume bystolic but at 2.5mg  qd. Monitor bp daily

## 2012-04-04 ENCOUNTER — Other Ambulatory Visit: Payer: Self-pay | Admitting: Family

## 2012-04-04 NOTE — Telephone Encounter (Signed)
Ok #90

## 2012-04-04 NOTE — Telephone Encounter (Signed)
Refill sent.

## 2012-04-04 NOTE — Telephone Encounter (Signed)
Last refill given 02/13/12 #90 x no refills. 1 tablet every 8 hours as needed for anxiety. Please advise re: refill.

## 2012-04-04 NOTE — Telephone Encounter (Signed)
Refill called [facility] voicemail, #90 x no refills.

## 2012-04-15 ENCOUNTER — Other Ambulatory Visit: Payer: Self-pay | Admitting: Internal Medicine

## 2012-04-16 NOTE — Telephone Encounter (Signed)
Refill left on pharmacy voicemail, zolpidem #30 x 2 refills.

## 2012-04-21 ENCOUNTER — Telehealth: Payer: Self-pay | Admitting: Internal Medicine

## 2012-04-21 MED ORDER — DULOXETINE HCL 30 MG PO CPEP: 90.0000 mg | ORAL_CAPSULE | Freq: Every day | ORAL | Status: DC

## 2012-04-21 NOTE — Telephone Encounter (Signed)
Refill- duloxetine hcl dr 30mg  cap. Take three capsules by mouth daily. Qty 90 last fill 12.27.13

## 2012-04-22 ENCOUNTER — Telehealth: Payer: Self-pay

## 2012-04-22 NOTE — Telephone Encounter (Signed)
Patient left a message on vm this morning stating that the nurse needed to call [employer] at [phone] about a PA for his Cymbalta.  I called and spoke with [place] and she states his PA for Cymbalta was done through 12-24-12 and the PA# is [id number].  I then called and explained to pt and he states that [facility] told him this morning that [employer] won't pay for this Cymbalta.   I then called and spoke to Lauren at [facility] and she states she left pt a message last night and pt called her back this morning and she explained to him that [employer] is going to pay for Cymbalta but he will still have a copay of $ 236.   I then called and explained this to pt. He voiced understanding

## 2012-05-15 ENCOUNTER — Other Ambulatory Visit: Payer: Self-pay | Admitting: Internal Medicine

## 2012-05-15 MED ORDER — LISDEXAMFETAMINE DIMESYLATE 70 MG PO CAPS: 70.0000 mg | ORAL_CAPSULE | ORAL | Status: DC

## 2012-05-15 NOTE — Telephone Encounter (Signed)
We received a fax for a refill on Diazepam for pt? Please advise? Last RX was wrote on 04-04-12 quantity 90 with 0 refills  If ok fax to [phone]  I clarified with pt that his Vyvanse is 70 mg. Also discussed this with Dr Abner Greenspan- ok to print 70 mg. Pt informed RX will be at the front desk.

## 2012-05-15 NOTE — Telephone Encounter (Signed)
Received fax from patient stating that he would like a new prescription for vyvance 20 mg capsules.

## 2012-05-15 NOTE — Telephone Encounter (Signed)
OK to refill Vyvanse at same strength, #30

## 2012-05-16 ENCOUNTER — Encounter: Payer: Self-pay | Admitting: Family Medicine

## 2012-05-16 ENCOUNTER — Ambulatory Visit (INDEPENDENT_AMBULATORY_CARE_PROVIDER_SITE_OTHER): Payer: BC Managed Care – PPO | Admitting: Family Medicine

## 2012-05-16 VITALS — BP 100/60 | HR 83 | Temp 98.8°F | Ht 73.0 in | Wt 248.2 lb

## 2012-05-16 DIAGNOSIS — L0291 Cutaneous abscess, unspecified: Secondary | ICD-10-CM

## 2012-05-16 DIAGNOSIS — L089 Local infection of the skin and subcutaneous tissue, unspecified: Secondary | ICD-10-CM

## 2012-05-16 MED ORDER — DIAZEPAM 5 MG PO TABS: 10.0000 mg | ORAL_TABLET | Freq: Three times a day (TID) | ORAL | Status: DC | PRN

## 2012-05-16 MED ORDER — DOXYCYCLINE HYCLATE 100 MG PO TABS: 100.0000 mg | ORAL_TABLET | Freq: Two times a day (BID) | ORAL | Status: DC

## 2012-05-16 NOTE — Patient Instructions (Addendum)
We'll notify you of your wound culture Start the Doxy twice daily- take w/ food Keep area clean and dry Apply peroxide twice daily if possible Call with any questions or concerns Hang in there!

## 2012-05-16 NOTE — Progress Notes (Signed)
Subjective:    Patient ID: Daniel [name], male    DOB: 06-10-1955, 57 y.o.   MRN: [id number]  HPI Back abscess- central lower-mid back, 1st noticed 2-3 days ago.  Became painful last night.  Has not drainage.  No fevers.  'it's hard as a rock'.  Hx of similar.  Has seen derm previously and had multiple areas lanced.   Review of Systems For ROS see HPI     Objective:   Physical Exam  Vitals reviewed. Constitutional: He appears well-developed and well-nourished. No distress.  Skin: Skin is warm and dry. There is erythema (4 cm arera of erythema and induration of mid lower thoracic back.  + TTP, no fluctuance.  copious pus expressed w/ firm pressure).          Assessment & Plan:

## 2012-05-16 NOTE — Addendum Note (Signed)
Addended by: Court Joy on: 05/16/2012 10:05 AM   Modules accepted: Orders

## 2012-05-18 NOTE — Assessment & Plan Note (Signed)
New to Daniel [name].  Abscess adequately drained using firm pressure.  Culture sent.  Pt started on abx.  Reviewed supportive care and red flags that should prompt return.  Pt expressed understanding and is in agreement w/ plan.

## 2012-05-19 ENCOUNTER — Ambulatory Visit (INDEPENDENT_AMBULATORY_CARE_PROVIDER_SITE_OTHER): Payer: BC Managed Care – PPO | Admitting: Family Medicine

## 2012-05-19 ENCOUNTER — Encounter: Payer: Self-pay | Admitting: Family Medicine

## 2012-05-19 VITALS — BP 118/78 | HR 87 | Temp 98.5°F | Ht 72.0 in | Wt 245.0 lb

## 2012-05-19 DIAGNOSIS — L0291 Cutaneous abscess, unspecified: Secondary | ICD-10-CM

## 2012-05-19 DIAGNOSIS — L03319 Cellulitis of trunk, unspecified: Secondary | ICD-10-CM

## 2012-05-19 DIAGNOSIS — I10 Essential (primary) hypertension: Secondary | ICD-10-CM

## 2012-05-19 DIAGNOSIS — L02219 Cutaneous abscess of trunk, unspecified: Secondary | ICD-10-CM

## 2012-05-19 MED ORDER — OXYCODONE-ACETAMINOPHEN 5-325 MG PO TABS: 1.0000 | ORAL_TABLET | Freq: Three times a day (TID) | ORAL | Status: DC | PRN

## 2012-05-19 MED ORDER — RIFAMPIN 300 MG PO CAPS: 300.0000 mg | ORAL_CAPSULE | Freq: Two times a day (BID) | ORAL | Status: AC

## 2012-05-19 NOTE — Patient Instructions (Addendum)
Hold Lipitor while on Rifampin Start Digestive Advantage probiotics daily, others are also good. Align , any on sale are fine 4 oz of warm prune juice and 2 tbls of Milk of Magnesium orally and a Dulcolax suppository per rectum will help constipation  Labs prior to next visit, lipid, renal, hgba1c, cbc, tsh, hepatic  Abscess An abscess is an infected area that contains a collection of pus and debris.It can occur in almost any part of the body. An abscess is also known as a furuncle or boil. CAUSES  An abscess occurs when tissue gets infected. This can occur from blockage of oil or sweat glands, infection of hair follicles, or a minor injury to the skin. As the body tries to fight the infection, pus collects in the area and creates pressure under the skin. This pressure causes pain. People with weakened immune systems have difficulty fighting infections and get certain abscesses more often.  SYMPTOMS Usually an abscess develops on the skin and becomes a painful mass that is red, warm, and tender. If the abscess forms under the skin, you may feel a moveable soft area under the skin. Some abscesses break open (rupture) on their own, but most will continue to get worse without care. The infection can spread deeper into the body and eventually into the bloodstream, causing you to feel ill.  DIAGNOSIS  Your caregiver will take your medical history and perform a physical exam. A sample of fluid may also be taken from the abscess to determine what is causing your infection. TREATMENT  Your caregiver may prescribe antibiotic medicines to fight the infection. However, taking antibiotics alone usually does not cure an abscess. Your caregiver may need to make a small cut (incision) in the abscess to drain the pus. In some cases, gauze is packed into the abscess to reduce pain and to continue draining the area. HOME CARE INSTRUCTIONS   Only take over-the-counter or prescription medicines for pain, discomfort,  or fever as directed by your caregiver.  If you were prescribed antibiotics, take them as directed. Finish them even if you start to feel better.  If gauze is used, follow your caregiver's directions for changing the gauze.  To avoid spreading the infection:  Keep your draining abscess covered with a bandage.  Wash your hands well.  Do not share personal care items, towels, or whirlpools with others.  Avoid skin contact with others.  Keep your skin and clothes clean around the abscess.  Keep all follow-up appointments as directed by your caregiver. SEEK MEDICAL CARE IF:   You have increased pain, swelling, redness, fluid drainage, or bleeding.  You have muscle aches, chills, or a general ill feeling.  You have a fever. MAKE SURE YOU:   Understand these instructions.  Will watch your condition.  Will get help right away if you are not doing well or get worse. Document Released: 12/13/2004 Document Revised: 09/04/2011 Document Reviewed: 05/18/2011 [facility] Patient Information 2013 [place], [place].

## 2012-05-20 ENCOUNTER — Encounter (INDEPENDENT_AMBULATORY_CARE_PROVIDER_SITE_OTHER): Payer: Self-pay | Admitting: General Surgery

## 2012-05-20 LAB — WOUND CULTURE: Gram Stain: NONE SEEN

## 2012-05-20 NOTE — Assessment & Plan Note (Signed)
Well controlled despite pain, no changes 

## 2012-05-20 NOTE — Progress Notes (Signed)
Patient ID: Daniel [name], male   DOB: May 17, 1955, 57 y.o.   MRN: [id number] Daniel [name] [id number] 09-22-1955 05/20/2012      Progress Note-Follow Up  Subjective  Chief Complaint  Chief Complaint  Patient presents with  . abcess on back    very painful- X 6 days    HPI  [name] who is in today complaining of recurrence of skin abscess. His one on his back went over his spine which is tender. He went to an urgent care and they started him on doxycycline and it is not improving. He denies fevers, chills, malaise, myalgias. No chest pain, palpitations, shortness or breath, GI or GU concerns. Reports he is bothered well to rifampin in the past when he has had previous similar lesions  Past Medical History  Diagnosis Date  . Depression   . Anxiety   . Hyperlipidemia   . Hypertension   . COPD (chronic obstructive pulmonary disease)   . Sleep apnea   . Obesity     History reviewed. No pertinent past surgical history.  Family History  Problem Relation Age of Onset  . Cancer Mother     ovarian  . Hyperlipidemia Father   . Hypertension Father   . Stroke Father   . Heart disease Father   . Emphysema Father     History   Social History  . Marital Status: Single    Spouse Name: N/A    Number of Children: N/A  . Years of Education: N/A   Occupational History  . Not on file.   Social History Main Topics  . Smoking status: Current Every Day Smoker -- 0.50 packs/day for 40 years    Types: Cigarettes  . Smokeless tobacco: Never Used     Comment: currently smoking 1/2 ppd.    . Alcohol Use: Yes     Comment: once a month, glass of wine  . Drug Use: Not on file  . Sexually Active: Not on file   Other Topics Concern  . Not on file   Social History Narrative   Regular exercise-no    Current Outpatient Prescriptions on File Prior to Visit  Medication Sig Dispense Refill  . aspirin EC 81 MG tablet Take 1 tablet (81 mg total) by mouth daily.       Marland Kitchen atorvastatin (LIPITOR) 40 MG tablet Take 1 tablet (40 mg total) by mouth daily.  90 tablet  1  . buPROPion (WELLBUTRIN XL) 150 MG 24 hr tablet Take 1 tablet (150 mg total) by mouth daily.  30 tablet  2  . buPROPion (WELLBUTRIN XL) 300 MG 24 hr tablet Take 1 tablet (300 mg total) by mouth daily.  30 tablet  2  . clobetasol (TEMOVATE) 0.05 % external solution USE TO SCALP AS DIRECTED  50 mL  6  . diazepam (VALIUM) 5 MG tablet Take 2 tablets (10 mg total) by mouth every 8 (eight) hours as needed for anxiety.  90 tablet  0  . DULoxetine (CYMBALTA) 30 MG capsule Take 3 capsules (90 mg total) by mouth daily.  90 capsule  2  . fenofibrate (TRICOR) 145 MG tablet Take 1 tablet (145 mg total) by mouth daily.  30 tablet  2  . lisdexamfetamine (VYVANSE) 70 MG capsule Take 1 capsule (70 mg total) by mouth every morning.  30 capsule  0  . metFORMIN (GLUCOPHAGE) 500 MG tablet Take 1 tablet (500 mg total) by mouth daily with breakfast.  30 tablet  3  . nebivolol (BYSTOLIC) 5 MG tablet Take 2.5 mg by mouth daily.      Marland Kitchen zolpidem (AMBIEN) 10 MG tablet take 1 tablet by mouth at bedtime if needed  30 tablet  2  . [DISCONTINUED] testosterone cypionate (DEPO-TESTOSTERONE) 200 MG/ML injection Inject 100 mg into the muscle once a week.         No current facility-administered medications on file prior to visit.    No Known Allergies  Review of Systems  Review of Systems  Constitutional: Negative for fever, chills and malaise/fatigue.  HENT: Negative for congestion.   Eyes: Negative for discharge.  Respiratory: Negative for shortness of breath.   Cardiovascular: Negative for chest pain, palpitations and leg swelling.  Gastrointestinal: Negative for nausea, abdominal pain and diarrhea.  Genitourinary: Negative for dysuria.  Musculoskeletal: Negative for falls.  Skin: Negative for rash.       Painful, raised, red lesion on back  Neurological: Negative for loss of consciousness and headaches.   Endo/Heme/Allergies: Negative for polydipsia.  Psychiatric/Behavioral: Negative for depression and suicidal ideas. The patient is not nervous/anxious and does not have insomnia.     Objective  BP 118/78  Pulse 87  Temp(Src) 98.5 F (36.9 C) (Oral)  Ht 6' (1.829 m)  Wt 245 lb (111.131 kg)  BMI 33.22 kg/m2  SpO2 98%  Physical Exam  Physical Exam  Constitutional: He is oriented to person, place, and time and well-developed, well-nourished, and in no distress. No distress.  HENT:  Head: Normocephalic and atraumatic.  Eyes: Conjunctivae are normal.  Neck: Neck supple. No thyromegaly present.  Cardiovascular: Normal rate, regular rhythm and normal heart sounds.   No murmur heard. Pulmonary/Chest: Effort normal and breath sounds normal. No respiratory distress.  Abdominal: He exhibits no distension and no mass. There is no tenderness.  Musculoskeletal: He exhibits no edema.  Neurological: He is alert and oriented to person, place, and time.  Skin: Skin is warm.  2-3 inch raised, erythematous, tender lesion no obvious pustular spot.   Psychiatric: Memory, affect and judgment normal.    Lab Results  Component Value Date   TSH 2.44 09/06/2010   Lab Results  Component Value Date   WBC 7.8 03/06/2012   HGB 16.3 03/06/2012   HCT 46.2 03/06/2012   MCV 87.7 03/06/2012   PLT 252 03/06/2012   Lab Results  Component Value Date   CREATININE 1.20 03/06/2012   BUN 15 03/06/2012   NA 141 03/06/2012   K 4.8 03/06/2012   CL 105 03/06/2012   CO2 28 03/06/2012   Lab Results  Component Value Date   ALT 22 03/06/2012   AST 19 03/06/2012   ALKPHOS 44 03/06/2012   BILITOT 0.5 03/06/2012   Lab Results  Component Value Date   CHOL 190 03/06/2012   Lab Results  Component Value Date   HDL 37* 03/06/2012   Lab Results  Component Value Date   LDLCALC 109* 03/06/2012   Lab Results  Component Value Date   TRIG 218* 03/06/2012   Lab Results  Component Value Date   CHOLHDL  5.1 03/06/2012     Assessment & Plan  ESSENTIAL HYPERTENSION Well controlled despite pain, no changes.  Skin abscess Not responding to Doxycycline prescribed by urgent care, has responded to Rifampin in past, will rx today and referred to general surgery so if Rifampin is unsuccessful he may have lesion debrided. Encouraged probiotics and cleansing with H2O2

## 2012-05-20 NOTE — Assessment & Plan Note (Signed)
Not responding to Doxycycline prescribed by urgent care, has responded to Rifampin in past, will rx today and referred to general surgery so if Rifampin is unsuccessful he may have lesion debrided. Encouraged probiotics and cleansing with H2O2

## 2012-05-21 ENCOUNTER — Telehealth: Payer: Self-pay | Admitting: Internal Medicine

## 2012-05-21 ENCOUNTER — Ambulatory Visit (INDEPENDENT_AMBULATORY_CARE_PROVIDER_SITE_OTHER): Payer: BC Managed Care – PPO | Admitting: Surgery

## 2012-05-21 ENCOUNTER — Encounter (INDEPENDENT_AMBULATORY_CARE_PROVIDER_SITE_OTHER): Payer: Self-pay | Admitting: Surgery

## 2012-05-21 VITALS — BP 125/82 | HR 72 | Temp 98.6°F | Resp 14 | Ht 72.0 in | Wt 244.4 lb

## 2012-05-21 DIAGNOSIS — L089 Local infection of the skin and subcutaneous tissue, unspecified: Secondary | ICD-10-CM

## 2012-05-21 DIAGNOSIS — L723 Sebaceous cyst: Secondary | ICD-10-CM

## 2012-05-21 NOTE — Telephone Encounter (Signed)
Patient states that he would like to have a referral for a colonoscopy. He wants to be referred somewhere that they put you to sleep. He says that his last colonoscopy was done in [place].

## 2012-05-21 NOTE — Telephone Encounter (Signed)
So they knock you out so you do not remember anything. Does he mean more than that? If so I will have to do some investigating to see who might do this

## 2012-05-21 NOTE — Progress Notes (Signed)
Subjective:     Patient ID: Daniel [name], male   DOB: [birth year], 57 y.o.   MRN: [id number]  HPI This is a pleasant gentleman who presents with an infected sebaceous cyst on his back. He is currently on antibiotics. He has no previous history of infected sebaceous cysts  Review of Systems     Objective:   Physical Exam There is a large area of induration and erythema on the back. I prepped the area Betadine, anesthetized with lidocaine, an incision with the scalpel and drained a large amount of sebaceous material and purulence. I then packed with gauze    Assessment:     Infected sebaceous cyst status post incision and drainage     Plan:     Wound care instructions were given. He will finish his course of antibiotics. I will see him back in 2 weeks .  He will pack the wound with gauze one to 2 times a day

## 2012-05-22 ENCOUNTER — Other Ambulatory Visit: Payer: Self-pay | Admitting: Family

## 2012-05-22 ENCOUNTER — Other Ambulatory Visit: Payer: Self-pay | Admitting: Family Medicine

## 2012-05-22 DIAGNOSIS — Z1211 Encounter for screening for malignant neoplasm of colon: Secondary | ICD-10-CM

## 2012-05-22 NOTE — Telephone Encounter (Signed)
Patient states that is fine "to be knocked out". Please send referral

## 2012-05-27 ENCOUNTER — Encounter: Payer: Self-pay | Admitting: Gastroenterology

## 2012-06-02 ENCOUNTER — Ambulatory Visit (INDEPENDENT_AMBULATORY_CARE_PROVIDER_SITE_OTHER): Payer: BC Managed Care – PPO | Admitting: Surgery

## 2012-06-02 ENCOUNTER — Encounter (INDEPENDENT_AMBULATORY_CARE_PROVIDER_SITE_OTHER): Payer: Self-pay | Admitting: Surgery

## 2012-06-02 VITALS — BP 124/80 | HR 90 | Temp 97.2°F | Resp 16 | Ht 72.0 in | Wt 245.2 lb

## 2012-06-02 DIAGNOSIS — Z09 Encounter for follow-up examination after completed treatment for conditions other than malignant neoplasm: Secondary | ICD-10-CM

## 2012-06-02 NOTE — Progress Notes (Signed)
Subjective:     Patient ID: Daniel [name], male   DOB: 31-Oct-1955, 57 y.o.   MRN: [id number]  HPI  He is here for a followup visit status post incision and drainage of infected sebaceous cyst on his back. He is doing well and has no complaints Review of Systems     Objective:   Physical Exam On exam, the incision is well healed and there is no evidence of ongoing infection    Assessment:     Patient stable status post I&D     Plan:     I will see him back as needed if the cyst recurs

## 2012-06-12 ENCOUNTER — Other Ambulatory Visit: Payer: Self-pay | Admitting: Family

## 2012-06-12 NOTE — Telephone Encounter (Signed)
Rx request to pharmacy; denoted change of Sig on medication 12.19.13 by PCP [fron 2 tabs daily back to 1/2 tablet daily]/SLS

## 2012-06-16 ENCOUNTER — Other Ambulatory Visit: Payer: Self-pay

## 2012-06-16 MED ORDER — DIAZEPAM 5 MG PO TABS: 10.0000 mg | ORAL_TABLET | Freq: Three times a day (TID) | ORAL | Status: DC | PRN

## 2012-06-16 NOTE — Telephone Encounter (Signed)
Please advise refill? Last RX was 05-16-12 quantity 90 with 0 refills.  If ok fax to [phone]

## 2012-06-17 NOTE — Telephone Encounter (Signed)
RX faxed

## 2012-07-09 ENCOUNTER — Encounter: Payer: BC Managed Care – PPO | Admitting: Gastroenterology

## 2012-07-17 LAB — HM COLONOSCOPY

## 2012-09-25 ENCOUNTER — Other Ambulatory Visit: Payer: Self-pay

## 2012-12-19 ENCOUNTER — Telehealth: Payer: Self-pay

## 2012-12-19 NOTE — Telephone Encounter (Signed)
We received a letter from patient that he needs a letter for bariatric surgery.   Per md pt needs to be seen to have pts chart updated.  Pt informed and states he will call back to arrange an appt.

## 2013-01-13 ENCOUNTER — Ambulatory Visit (INDEPENDENT_AMBULATORY_CARE_PROVIDER_SITE_OTHER): Payer: BC Managed Care – PPO | Admitting: Family Medicine

## 2013-01-13 ENCOUNTER — Encounter: Payer: Self-pay | Admitting: Family Medicine

## 2013-01-13 VITALS — BP 110/78 | HR 76 | Temp 98.3°F | Ht 72.0 in | Wt 261.1 lb

## 2013-01-13 DIAGNOSIS — I10 Essential (primary) hypertension: Secondary | ICD-10-CM

## 2013-01-13 DIAGNOSIS — M1990 Unspecified osteoarthritis, unspecified site: Secondary | ICD-10-CM | POA: Insufficient documentation

## 2013-01-13 DIAGNOSIS — E119 Type 2 diabetes mellitus without complications: Secondary | ICD-10-CM

## 2013-01-13 DIAGNOSIS — E785 Hyperlipidemia, unspecified: Secondary | ICD-10-CM

## 2013-01-13 DIAGNOSIS — E669 Obesity, unspecified: Secondary | ICD-10-CM

## 2013-01-13 DIAGNOSIS — Z23 Encounter for immunization: Secondary | ICD-10-CM

## 2013-01-13 DIAGNOSIS — F17200 Nicotine dependence, unspecified, uncomplicated: Secondary | ICD-10-CM

## 2013-01-13 DIAGNOSIS — G4733 Obstructive sleep apnea (adult) (pediatric): Secondary | ICD-10-CM

## 2013-01-13 NOTE — Assessment & Plan Note (Addendum)
Arthritis in both knees, symptomatic most days. Is worsened by obesity

## 2013-01-13 NOTE — Assessment & Plan Note (Addendum)
Hoping to proceed with gastric bypass  At [facility] numerous secondary comorbidities. As a result this surgery is medically necessary. He has considered surgery in past at [facility] but with his new insurance has chosen to proceed at [facility]

## 2013-01-13 NOTE — Patient Instructions (Signed)

## 2013-01-13 NOTE — Assessment & Plan Note (Signed)
Well controlled on current meds, no changes to meds today

## 2013-01-18 NOTE — Assessment & Plan Note (Signed)
tolerting Atorvastatin, avoid trans ats, increase exercise, attempt weight loss

## 2013-01-18 NOTE — Assessment & Plan Note (Signed)
Affected by obesity would benefit from weight loss

## 2013-01-18 NOTE — Assessment & Plan Note (Signed)
Has quit altogether now. Was down to 1 cigarette a day for the past month and quit for good this week

## 2013-01-18 NOTE — Assessment & Plan Note (Signed)
Given Pneumonia and flu shots today.  

## 2013-01-18 NOTE — Progress Notes (Signed)
Patient ID: Daniel [name], male   DOB: [birth year], 56 y.o.   MRN: [id number]  Daniel [name] [id number] [birth year] 01/18/2013      Progress Note-Follow Up  Subjective  Chief Complaint  Chief Complaint  Patient presents with  . Follow-up  . Injections    flu and Prevnar    HPI  Patient is a 57 year old male who is in today for followup. Decided to proceed with gastric bypass surgery. He has been contemplating for number of years and is Candidate. He has numerous comorbidities which would benefit from weight loss including diabetes, hyperlipidemia, sleep apnea, pedal edema, osteoarthritis. He splits his time between [place] and [place] so has been receiving some of his medical care elsewhere. No change in meds. Continues to struggle with pedal edema and knee pain. Continues to struggle with fatigue. Has trouble with some increased right leg pain secondary to some sciatica. He has been down to one cigarette daily for about a month and quit completely this week.  Past Medical History  Diagnosis Date  . Depression   . Anxiety   . Hyperlipidemia   . Hypertension   . COPD (chronic obstructive pulmonary disease)   . Sleep apnea   . Obesity   . Osteoarthritis 01/13/2013    Past Surgical History  Procedure Laterality Date  . Irrigation and debridement sebaceous cyst      back    Family History  Problem Relation Age of Onset  . Cancer Mother     ovarian  . Hyperlipidemia Father   . Hypertension Father   . Stroke Father   . Heart disease Father   . Emphysema Father     History   Social History  . Marital Status: Single    Spouse Name: N/A    Number of Children: N/A  . Years of Education: N/A   Occupational History  . Not on file.   Social History Main Topics  . Smoking status: Former Smoker -- 0.50 packs/day for 40 years    Types: Cigarettes    Quit date: 01/08/2013  . Smokeless tobacco: Never Used     Comment: currently smoking 1/2 ppd.    . Alcohol Use: No      Comment: once a month, glass of wine  . Drug Use: No  . Sexual Activity: Not on file   Other Topics Concern  . Not on file   Social History Narrative   Regular exercise-no    Current Outpatient Prescriptions on File Prior to Visit  Medication Sig Dispense Refill  . atorvastatin (LIPITOR) 40 MG tablet Take 1 tablet (40 mg total) by mouth daily.  90 tablet  1  . clobetasol (TEMOVATE) 0.05 % external solution USE TO SCALP AS DIRECTED  50 mL  6  . diazepam (VALIUM) 5 MG tablet Take 2 tablets (10 mg total) by mouth every 8 (eight) hours as needed for anxiety.  90 tablet  0  . DULoxetine (CYMBALTA) 30 MG capsule Take 3 capsules (90 mg total) by mouth daily.  90 capsule  2  . fenofibrate (TRICOR) 145 MG tablet Take 1 tablet (145 mg total) by mouth daily.  30 tablet  2  . metFORMIN (GLUCOPHAGE) 500 MG tablet Take 1 tablet (500 mg total) by mouth daily with breakfast.  30 tablet  3  . nebivolol (BYSTOLIC) 5 MG tablet Take 0.5 tablets (2.5 mg total) by mouth daily.  45 tablet  0  . zolpidem (AMBIEN) 10 MG tablet take 1 tablet by  mouth at bedtime if needed  30 tablet  2  . lisdexamfetamine (VYVANSE) 70 MG capsule Take 1 capsule (70 mg total) by mouth every morning.  30 capsule  0  . [DISCONTINUED] testosterone cypionate (DEPO-TESTOSTERONE) 200 MG/ML injection Inject 100 mg into the muscle once a week.         No current facility-administered medications on file prior to visit.    No Known Allergies  Review of Systems  Review of Systems  Constitutional: Positive for malaise/fatigue. Negative for fever.  HENT: Negative for congestion.   Eyes: Negative for discharge.  Respiratory: Negative for shortness of breath.   Cardiovascular: Positive for leg swelling. Negative for chest pain and palpitations.  Gastrointestinal: Negative for nausea, abdominal pain and diarrhea.  Genitourinary: Negative for dysuria.  Musculoskeletal: Positive for back pain and joint pain. Negative for falls.       B/l  knee pain  Skin: Negative for rash.  Neurological: Negative for loss of consciousness and headaches.  Endo/Heme/Allergies: Negative for polydipsia.  Psychiatric/Behavioral: Negative for depression and suicidal ideas. The patient is not nervous/anxious and does not have insomnia.     Objective  BP 110/78  Pulse 76  Temp(Src) 98.3 F (36.8 C) (Oral)  Ht 6' (1.829 m)  Wt 261 lb 1.3 oz (118.425 kg)  BMI 35.40 kg/m2  SpO2 97%  Physical Exam  Physical Exam  Constitutional: He is oriented to person, place, and time and well-developed, well-nourished, and in no distress. No distress.  HENT:  Head: Normocephalic and atraumatic.  Eyes: Conjunctivae are normal.  Neck: Neck supple. No thyromegaly present.  Cardiovascular: Normal rate, regular rhythm and normal heart sounds.   No murmur heard. Pulmonary/Chest: Effort normal and breath sounds normal. No respiratory distress.  Abdominal: He exhibits no distension and no mass. There is no tenderness.  Musculoskeletal: He exhibits no edema.  Neurological: He is alert and oriented to person, place, and time.  Skin: Skin is warm.  Psychiatric: Memory, affect and judgment normal.    Lab Results  Component Value Date   TSH 2.44 09/06/2010   Lab Results  Component Value Date   WBC 7.8 03/06/2012   HGB 16.3 03/06/2012   HCT 46.2 03/06/2012   MCV 87.7 03/06/2012   PLT 252 03/06/2012   Lab Results  Component Value Date   CREATININE 1.20 03/06/2012   BUN 15 03/06/2012   NA 141 03/06/2012   K 4.8 03/06/2012   CL 105 03/06/2012   CO2 28 03/06/2012   Lab Results  Component Value Date   ALT 22 03/06/2012   AST 19 03/06/2012   ALKPHOS 44 03/06/2012   BILITOT 0.5 03/06/2012   Lab Results  Component Value Date   CHOL 190 03/06/2012   Lab Results  Component Value Date   HDL 37* 03/06/2012   Lab Results  Component Value Date   LDLCALC 109* 03/06/2012   Lab Results  Component Value Date   TRIG 218* 03/06/2012   Lab Results   Component Value Date   CHOLHDL 5.1 03/06/2012     Assessment & Plan  Obesity Hoping to proceed with gastric bypass  At [facility], numerous secondary comorbidities. As a result this surgery is medically necessary. He has considered surgery in past at [facility] but with his new insurance has chosen to proceed at [facility]  Osteoarthritis Arthritis in both knees, symptomatic most days. Is worsened by obesity  ESSENTIAL HYPERTENSION Well controlled on current meds, no changes to meds today  TOBACCO ABUSE Has quit altogether now. Was down to 1 cigarette a day for the past month and quit for good this week  OBSTRUCTIVE SLEEP APNEA Affected by obesity would benefit from weight loss  HYPERLIPIDEMIA tolerting Atorvastatin, avoid trans ats, increase exercise, attempt weight loss  DM (diabetes mellitus) Given Pneumonia and flu shots today

## 2013-04-29 ENCOUNTER — Other Ambulatory Visit: Payer: Self-pay

## 2013-04-29 DIAGNOSIS — E119 Type 2 diabetes mellitus without complications: Secondary | ICD-10-CM

## 2013-04-29 MED ORDER — METFORMIN HCL 500 MG PO TABS: 500.0000 mg | ORAL_TABLET | Freq: Every day | ORAL | Status: DC

## 2013-04-29 NOTE — Telephone Encounter (Signed)
Patient left a message stating that he is in [place] working and needs his Metformin refilled.  RX sent to [facility] on [address] in [place] and pt notified

## 2013-06-05 ENCOUNTER — Other Ambulatory Visit: Payer: Self-pay | Admitting: Family Medicine

## 2013-09-29 ENCOUNTER — Telehealth: Payer: Self-pay

## 2013-09-29 DIAGNOSIS — E785 Hyperlipidemia, unspecified: Secondary | ICD-10-CM

## 2013-09-29 DIAGNOSIS — E118 Type 2 diabetes mellitus with unspecified complications: Secondary | ICD-10-CM

## 2013-09-29 NOTE — Telephone Encounter (Signed)
Diabetic Bundle-Informed pt that we need him to come in at his earliest convenience for labs to be drawn for LDL and A1C.   Pt voiced understanding and says he will have to call us back because he is in [place] and doesn't know when he will be back this way  Lab order placed

## 2013-11-16 ENCOUNTER — Telehealth: Payer: Self-pay | Admitting: Family Medicine

## 2013-11-16 NOTE — Telephone Encounter (Signed)
Left patient a vm to call in to schedule a CPE. Could not find last CPE in system.

## 2014-05-04 ENCOUNTER — Telehealth: Payer: Self-pay | Admitting: Family Medicine

## 2014-05-04 NOTE — Telephone Encounter (Signed)
Caller name: Audie ClearSwaim, Alastor M Relation to pt: self  Call back number: [phone] Pharmacy: [id number] [place], [place] - [address] [facility] [address] [facility] [address] [phone] (Phone) [phone] (Fax)         Reason for call:  Pt states prio-auth is required for nebivolol (BYSTOLIC) 5 MG tablet. Pt states he is completely out of he's heart medication and would like a follow up call informing him of the status.

## 2014-05-05 MED ORDER — NEBIVOLOL HCL 5 MG PO TABS: 2.5000 mg | ORAL_TABLET | Freq: Every day | ORAL | Status: DC

## 2014-05-05 NOTE — Telephone Encounter (Signed)
[facility] as [facility] expedited [facility]. Approved effective 05/05/2014 through 03/18/2038. JG//CMA

## 2014-05-05 NOTE — Telephone Encounter (Signed)
Refill e-scribed to [facility]

## 2014-05-05 NOTE — Addendum Note (Signed)
Addended by: Amado CoeGLOVER, Caylin Raby A on: 05/05/2014 02:28 PM   Modules accepted: Orders

## 2014-05-05 NOTE — Telephone Encounter (Signed)
Patient states that he has been out for a week and needs this sent in today.

## 2014-10-27 ENCOUNTER — Telehealth: Payer: Self-pay

## 2014-10-27 NOTE — Telephone Encounter (Signed)
Pt returning call. Best # [phone]

## 2014-10-27 NOTE — Telephone Encounter (Signed)
LMOVM

## 2014-10-28 ENCOUNTER — Encounter: Payer: Self-pay | Admitting: Family Medicine

## 2014-10-28 ENCOUNTER — Ambulatory Visit (INDEPENDENT_AMBULATORY_CARE_PROVIDER_SITE_OTHER): Payer: BLUE CROSS/BLUE SHIELD | Admitting: Family Medicine

## 2014-10-28 VITALS — BP 130/82 | HR 63 | Temp 98.6°F | Ht 73.0 in | Wt 173.2 lb

## 2014-10-28 DIAGNOSIS — E785 Hyperlipidemia, unspecified: Secondary | ICD-10-CM | POA: Diagnosis not present

## 2014-10-28 DIAGNOSIS — E119 Type 2 diabetes mellitus without complications: Secondary | ICD-10-CM

## 2014-10-28 DIAGNOSIS — Z9884 Bariatric surgery status: Secondary | ICD-10-CM | POA: Diagnosis not present

## 2014-10-28 DIAGNOSIS — E669 Obesity, unspecified: Secondary | ICD-10-CM

## 2014-10-28 DIAGNOSIS — E559 Vitamin D deficiency, unspecified: Secondary | ICD-10-CM

## 2014-10-28 DIAGNOSIS — Z0000 Encounter for general adult medical examination without abnormal findings: Secondary | ICD-10-CM | POA: Diagnosis not present

## 2014-10-28 DIAGNOSIS — I10 Essential (primary) hypertension: Secondary | ICD-10-CM | POA: Diagnosis not present

## 2014-10-28 LAB — COMPREHENSIVE METABOLIC PANEL
ALT: 37 U/L (ref 0–53)
AST: 25 U/L (ref 0–37)
Albumin: 3.7 g/dL (ref 3.5–5.2)
Alkaline Phosphatase: 125 U/L — ABNORMAL HIGH (ref 39–117)
BILIRUBIN TOTAL: 0.6 mg/dL (ref 0.2–1.2)
BUN: 11 mg/dL (ref 6–23)
CO2: 31 meq/L (ref 19–32)
CREATININE: 0.67 mg/dL (ref 0.40–1.50)
Calcium: 8.9 mg/dL (ref 8.4–10.5)
Chloride: 107 mEq/L (ref 96–112)
GFR: 129.26 mL/min (ref >60.00)
GLUCOSE: 91 mg/dL (ref 70–99)
Potassium: 3.5 mEq/L (ref 3.5–5.1)
Sodium: 143 mEq/L (ref 135–145)
Total Protein: 6.1 g/dL (ref 6.0–8.3)

## 2014-10-28 LAB — LIPID PANEL
CHOLESTEROL: 139 mg/dL (ref 0–200)
HDL: 33.6 mg/dL — ABNORMAL LOW (ref >39.00)
LDL CALC: 94 mg/dL (ref 0–99)
NonHDL: 105.47
TRIGLYCERIDES: 59 mg/dL (ref 0.0–149.0)
Total CHOL/HDL Ratio: 4
VLDL: 11.8 mg/dL (ref 0.0–40.0)

## 2014-10-28 LAB — HEMOGLOBIN A1C: HEMOGLOBIN A1C: 5.3 % (ref 4.6–6.5)

## 2014-10-28 LAB — VITAMIN B12: Vitamin B-12: 280 pg/mL (ref 211–911)

## 2014-10-28 LAB — CBC
HEMATOCRIT: 43.5 % (ref 39.0–52.0)
HEMOGLOBIN: 14.6 g/dL (ref 13.0–17.0)
MCHC: 33.6 g/dL (ref 30.0–36.0)
MCV: 90.8 fl (ref 78.0–100.0)
PLATELETS: 207 10*3/uL (ref 150.0–400.0)
RBC: 4.8 Mil/uL (ref 4.22–5.81)
RDW: 14.4 % (ref 11.5–15.5)
WBC: 5.8 10*3/uL (ref 4.0–10.5)

## 2014-10-28 LAB — MICROALBUMIN / CREATININE URINE RATIO
CREATININE, U: 247.3 mg/dL
MICROALB/CREAT RATIO: 0.8 mg/g (ref 0.0–30.0)
Microalb, Ur: 2 mg/dL — ABNORMAL HIGH (ref 0.0–1.9)

## 2014-10-28 LAB — VITAMIN D 25 HYDROXY (VIT D DEFICIENCY, FRACTURES): VITD: 7.93 ng/mL — ABNORMAL LOW (ref 30.00–100.00)

## 2014-10-28 LAB — TESTOSTERONE: TESTOSTERONE: 341.33 ng/dL (ref 300.00–890.00)

## 2014-10-28 LAB — TSH: TSH: 1.79 u[IU]/mL (ref 0.35–4.50)

## 2014-10-28 MED ORDER — DOXYCYCLINE HYCLATE 100 MG PO CAPS: 100.0000 mg | ORAL_CAPSULE | Freq: Two times a day (BID) | ORAL | Status: DC

## 2014-10-28 MED ORDER — BUPROPION HCL ER (XL) 150 MG PO TB24: ORAL_TABLET | ORAL | Status: DC

## 2014-10-28 MED ORDER — NEBIVOLOL HCL 5 MG PO TABS: 2.5000 mg | ORAL_TABLET | Freq: Every day | ORAL | Status: DC

## 2014-10-28 MED ORDER — ZOLPIDEM TARTRATE 10 MG PO TABS: ORAL_TABLET | ORAL | Status: DC

## 2014-10-28 MED ORDER — DULOXETINE HCL 30 MG PO CPEP: 90.0000 mg | ORAL_CAPSULE | Freq: Every day | ORAL | Status: DC

## 2014-10-28 MED ORDER — DIAZEPAM 5 MG PO TABS
10.0000 mg | ORAL_TABLET | Freq: Three times a day (TID) | ORAL | Status: DC | PRN
Start: 2014-10-28 — End: 2014-11-29

## 2014-10-28 MED ORDER — CLOBETASOL PROPIONATE 0.05 % EX SOLN: CUTANEOUS | Status: DC

## 2014-10-28 NOTE — Progress Notes (Signed)
Daniel [name] [id number] 10-Apr-1955 10/28/2014      Progress Note  Patient  Subjective  Chief Complaint  Chief Complaint  Patient presents with  . Annual Exam    HPI  Patient is a 59 year old male in today for routine medical care. He is in need of annual exam. He feels well. He underwent a gastric bypass in 2015 with good weight loss. No polyuria or polydipsia. No recent illness. Denies CP/palp/SOB/HA/congestion/fevers/GI or GU c/o. Taking meds as prescribed  Past Medical History  Diagnosis Date  . Depression   . Anxiety   . Hyperlipidemia   . Hypertension   . COPD (chronic obstructive pulmonary disease)   . Sleep apnea   . Obesity   . Osteoarthritis 01/13/2013  . Osteoarthritis     left knee, treating with injections in [facility]    Past Surgical History  Procedure Laterality Date  . Irrigation and debridement sebaceous cyst      back    Family History  Problem Relation Age of Onset  . Cancer Mother     ovarian  . Hyperlipidemia Father   . Hypertension Father   . Stroke Father   . Heart disease Father   . Emphysema Father     Social History   Social History  . Marital Status: Single    Spouse Name: N/A  . Number of Children: N/A  . Years of Education: N/A   Occupational History  . Not on file.   Social History Main Topics  . Smoking status: Former Smoker -- 0.50 packs/day for 40 years    Types: Cigarettes    Quit date: 01/08/2013  . Smokeless tobacco: Never Used     Comment: currently smoking 1/2 ppd.   Patient started back smoking. smokes one pack per day.  . Alcohol Use: No     Comment: once a month, glass of wine  . Drug Use: No  . Sexual Activity: Not on file   Other Topics Concern  . Not on file   Social History Narrative   Regular exercise-no    Current Outpatient Prescriptions on File Prior to Visit  Medication Sig Dispense Refill  . buPROPion (WELLBUTRIN XL) 150 MG 24 hr tablet tid    . clobetasol (TEMOVATE) 0.05 %  external solution USE TO SCALP AS DIRECTED 50 mL 6  . diazepam (VALIUM) 5 MG tablet Take 2 tablets (10 mg total) by mouth every 8 (eight) hours as needed for anxiety. 90 tablet 0  . DULoxetine (CYMBALTA) 30 MG capsule Take 3 capsules (90 mg total) by mouth daily. 90 capsule 2  . nebivolol (BYSTOLIC) 5 MG tablet Take 0.5 tablets (2.5 mg total) by mouth daily. 45 tablet 1  . zolpidem (AMBIEN) 10 MG tablet take 1 tablet by mouth at bedtime if needed 30 tablet 2  . atorvastatin (LIPITOR) 40 MG tablet Take 1 tablet (40 mg total) by mouth daily. (Patient not taking: Reported on 10/28/2014) 90 tablet 1  . metFORMIN (GLUCOPHAGE) 500 MG tablet TAKE 1 TABLET (500 MG TOTAL) BY MOUTH DAILY WITH BREAKFAST. (Patient not taking: Reported on 10/28/2014) 30 tablet 2  . [DISCONTINUED] testosterone cypionate (DEPO-TESTOSTERONE) 200 MG/ML injection Inject 100 mg into the muscle once a week.       No current facility-administered medications on file prior to visit.    No Known Allergies  Review of Systems  Review of Systems  Constitutional: Negative for fever, chills and malaise/fatigue.  HENT: Negative for congestion and hearing  loss.   Eyes: Negative for discharge.  Respiratory: Negative for cough, sputum production and shortness of breath.   Cardiovascular: Negative for chest pain, palpitations and leg swelling.  Gastrointestinal: Negative for heartburn, nausea, vomiting, abdominal pain, diarrhea, constipation and blood in stool.  Genitourinary: Negative for dysuria, urgency, frequency and hematuria.  Musculoskeletal: Negative for myalgias, back pain and falls.  Skin: Negative for rash.  Neurological: Negative for dizziness, sensory change, loss of consciousness, weakness and headaches.  Endo/Heme/Allergies: Negative for environmental allergies. Does not bruise/bleed easily.  Psychiatric/Behavioral: Negative for depression and suicidal ideas. The patient is not nervous/anxious and does not have insomnia.      Objective  BP 130/82 mmHg  Pulse 63  Temp(Src) 98.6 F (37 C) (Oral)  Ht 6\' 1"  (1.854 m)  Wt 173 lb 4 oz (78.586 kg)  BMI 22.86 kg/m2  SpO2 98%  Physical Exam  Physical Exam  Constitutional: He is oriented to person, place, and time and well-developed, well-nourished, and in no distress. No distress.  HENT:  Head: Normocephalic and atraumatic.  Eyes: Conjunctivae are normal.  Neck: Neck supple. No thyromegaly present.  Cardiovascular: Normal rate, regular rhythm and normal heart sounds.   No murmur heard. Pulmonary/Chest: Effort normal and breath sounds normal. No respiratory distress.  Abdominal: He exhibits no distension and no mass. There is no tenderness.  Musculoskeletal: He exhibits no edema.  Neurological: He is alert and oriented to person, place, and time.  Skin: Skin is warm.  Psychiatric: Memory, affect and judgment normal.       Assessment & Plan  Overweight S/p Duodenal Switch at [facility] with Dr Bufford Lope on Jul 30, 2013. Has had good weight loss is due for follow up this next week. Had a difficult with recovery notably N/V in the beginning now better.  TOBACCO ABUSE Encouraged complete cessation. Discussed need to quit as relates to risk of numerous cancers, cardiac and pulmonary disease as well as neurologic complications. Counseled for greater than 3 minutes  Vitamin d deficiency Very low on recheck today will restart supplements and moinitor  DM (diabetes mellitus) S/p gastric bypass, hgba1c is now 5.3. hgba1c acceptable, minimize simple carbs. Increase exercise as tolerated. Continue current meds  Annual physical exam Patient encouraged to maintain heart healthy diet, regular exercise, adequate sleep. Consider daily probiotics. Take medications as prescribed. Labs ordered and reviewed. Given and reviewed copy of ACP documents from [facility] Secretary of State and encouraged to complete and return  Obesity Good weight loss s/p gastric bypass. Encouraged  DASH diet, vitamin levels checked today, only low vitamin D  Essential hypertension Well controlled, no changes to meds. Encouraged heart healthy diet such as the DASH diet and exercise as tolerated.   Hyperlipidemia Tolerating statin, encouraged heart healthy diet, avoid trans fats, minimize simple carbs and saturated fats. Increase exercise as tolerated  TESTICULAR HYPOFUNCTION Testosterone wnl

## 2014-10-28 NOTE — Assessment & Plan Note (Signed)
S/p Duodenal Switch at [facility] with Dr Bufford Lope on Jul 30, 2013. Has had good weight loss is due for follow up this next week. Had a difficult with recovery notably N/V in the beginning now better.

## 2014-10-28 NOTE — Patient Instructions (Signed)
Preventive Care for Adults A healthy lifestyle and preventive care can promote health and wellness. Preventive health guidelines for men include the following key practices:  A routine yearly physical is a good way to check with your health care provider about your health and preventative screening. It is a chance to share any concerns and updates on your health and to receive a thorough exam.  Visit your dentist for a routine exam and preventative care every 6 months. Brush your teeth twice a day and floss once a day. Good oral hygiene prevents tooth decay and gum disease.  The frequency of eye exams is based on your age, health, family medical history, use of contact lenses, and other factors. Follow your health care provider's recommendations for frequency of eye exams.  Eat a healthy diet. Foods such as vegetables, fruits, whole grains, low-fat dairy products, and lean protein foods contain the nutrients you need without too many calories. Decrease your intake of foods high in solid fats, added sugars, and salt. Eat the right amount of calories for you.Get information about a proper diet from your health care provider, if necessary.  Regular physical exercise is one of the most important things you can do for your health. Most adults should get at least 150 minutes of moderate-intensity exercise (any activity that increases your heart rate and causes you to sweat) each week. In addition, most adults need muscle-strengthening exercises on 2 or more days a week.  Maintain a healthy weight. The body mass index (BMI) is a screening tool to identify possible weight problems. It provides an estimate of body fat based on height and weight. Your health care provider can find your BMI and can help you achieve or maintain a healthy weight.For adults 20 years and older:  A BMI below 18.5 is considered underweight.  A BMI of 18.5 to 24.9 is normal.  A BMI of 25 to 29.9 is considered overweight.  A BMI  of 30 and above is considered obese.  Maintain normal blood lipids and cholesterol levels by exercising and minimizing your intake of saturated fat. Eat a balanced diet with plenty of fruit and vegetables. Blood tests for lipids and cholesterol should begin at age 50 and be repeated every 5 years. If your lipid or cholesterol levels are high, you are over 50, or you are at high risk for heart disease, you may need your cholesterol levels checked more frequently.Ongoing high lipid and cholesterol levels should be treated with medicines if diet and exercise are not working.  If you smoke, find out from your health care provider how to quit. If you do not use tobacco, do not start.  Lung cancer screening is recommended for adults aged 73-80 years who are at high risk for developing lung cancer because of a history of smoking. A yearly low-dose CT scan of the lungs is recommended for people who have at least a 30-pack-year history of smoking and are a current smoker or have quit within the past 15 years. A pack year of smoking is smoking an average of 1 pack of cigarettes a day for 1 year (for example: 1 pack a day for 30 years or 2 packs a day for 15 years). Yearly screening should continue until the smoker has stopped smoking for at least 15 years. Yearly screening should be stopped for people who develop a health problem that would prevent them from having lung cancer treatment.  If you choose to drink alcohol, do not have more than  2 drinks per day. One drink is considered to be 12 ounces (355 mL) of beer, 5 ounces (148 mL) of wine, or 1.5 ounces (44 mL) of liquor.  Avoid use of street drugs. Do not share needles with anyone. Ask for help if you need support or instructions about stopping the use of drugs.  High blood pressure causes heart disease and increases the risk of stroke. Your blood pressure should be checked at least every 1-2 years. Ongoing high blood pressure should be treated with  medicines, if weight loss and exercise are not effective.  If you are 45-79 years old, ask your health care provider if you should take aspirin to prevent heart disease.  Diabetes screening involves taking a blood sample to check your fasting blood sugar level. This should be done once every 3 years, after age 45, if you are within normal weight and without risk factors for diabetes. Testing should be considered at a younger age or be carried out more frequently if you are overweight and have at least 1 risk factor for diabetes.  Colorectal cancer can be detected and often prevented. Most routine colorectal cancer screening begins at the age of 50 and continues through age 75. However, your health care provider may recommend screening at an earlier age if you have risk factors for colon cancer. On a yearly basis, your health care provider may provide home test kits to check for hidden blood in the stool. Use of a small camera at the end of a tube to directly examine the colon (sigmoidoscopy or colonoscopy) can detect the earliest forms of colorectal cancer. Talk to your health care provider about this at age 50, when routine screening begins. Direct exam of the colon should be repeated every 5-10 years through age 75, unless early forms of precancerous polyps or small growths are found.  People who are at an increased risk for hepatitis B should be screened for this virus. You are considered at high risk for hepatitis B if:  You were born in a country where hepatitis B occurs often. Talk with your health care provider about which countries are considered high risk.  Your parents were born in a high-risk country and you have not received a shot to protect against hepatitis B (hepatitis B vaccine).  You have HIV or AIDS.  You use needles to inject street drugs.  You live with, or have sex with, someone who has hepatitis B.  You are a man who has sex with other men (MSM).  You get hemodialysis  treatment.  You take certain medicines for conditions such as cancer, organ transplantation, and autoimmune conditions.  Hepatitis C blood testing is recommended for all people born from 1945 through 1965 and any individual with known risks for hepatitis C.  Practice safe sex. Use condoms and avoid high-risk sexual practices to reduce the spread of sexually transmitted infections (STIs). STIs include gonorrhea, chlamydia, syphilis, trichomonas, herpes, HPV, and human immunodeficiency virus (HIV). Herpes, HIV, and HPV are viral illnesses that have no cure. They can result in disability, cancer, and death.  If you are at risk of being infected with HIV, it is recommended that you take a prescription medicine daily to prevent HIV infection. This is called preexposure prophylaxis (PrEP). You are considered at risk if:  You are a man who has sex with other men (MSM) and have other risk factors.  You are a heterosexual man, are sexually active, and are at increased risk for HIV infection.    You take drugs by injection.  You are sexually active with a partner who has HIV.  Talk with your health care provider about whether you are at high risk of being infected with HIV. If you choose to begin PrEP, you should first be tested for HIV. You should then be tested every 3 months for as long as you are taking PrEP.  A one-time screening for abdominal aortic aneurysm (AAA) and surgical repair of large AAAs by ultrasound are recommended for men ages 32 to 67 years who are current or former smokers.  Healthy men should no longer receive prostate-specific antigen (PSA) blood tests as part of routine cancer screening. Talk with your health care provider about prostate cancer screening.  Testicular cancer screening is not recommended for adult males who have no symptoms. Screening includes self-exam, a health care provider exam, and other screening tests. Consult with your health care provider about any symptoms  you have or any concerns you have about testicular cancer.  Use sunscreen. Apply sunscreen liberally and repeatedly throughout the day. You should seek shade when your shadow is shorter than you. Protect yourself by wearing long sleeves, pants, a wide-brimmed hat, and sunglasses year round, whenever you are outdoors.  Once a month, do a whole-body skin exam, using a mirror to look at the skin on your back. Tell your health care provider about new moles, moles that have irregular borders, moles that are larger than a pencil eraser, or moles that have changed in shape or color.  Stay current with required vaccines (immunizations).  Influenza vaccine. All adults should be immunized every year.  Tetanus, diphtheria, and acellular pertussis (Td, Tdap) vaccine. An adult who has not previously received Tdap or who does not know his vaccine status should receive 1 dose of Tdap. This initial dose should be followed by tetanus and diphtheria toxoids (Td) booster doses every 10 years. Adults with an unknown or incomplete history of completing a 3-dose immunization series with Td-containing vaccines should begin or complete a primary immunization series including a Tdap dose. Adults should receive a Td booster every 10 years.  Varicella vaccine. An adult without evidence of immunity to varicella should receive 2 doses or a second dose if he has previously received 1 dose.  Human papillomavirus (HPV) vaccine. Males aged 68-21 years who have not received the vaccine previously should receive the 3-dose series. Males aged 22-26 years may be immunized. Immunization is recommended through the age of 6 years for any male who has sex with males and did not get any or all doses earlier. Immunization is recommended for any person with an immunocompromised condition through the age of 49 years if he did not get any or all doses earlier. During the 3-dose series, the second dose should be obtained 4-8 weeks after the first  dose. The third dose should be obtained 24 weeks after the first dose and 16 weeks after the second dose.  Zoster vaccine. One dose is recommended for adults aged 50 years or older unless certain conditions are present.  Measles, mumps, and rubella (MMR) vaccine. Adults born before [birth year] generally are considered immune to measles and mumps. Adults born in [birth year] or later should have 1 or more doses of MMR vaccine unless there is a contraindication to the vaccine or there is laboratory evidence of immunity to each of the three diseases. A routine second dose of MMR vaccine should be obtained at least 28 days after the first dose for students attending postsecondary  schools, health care workers, or international travelers. People who received inactivated measles vaccine or an unknown type of measles vaccine during 1963-1967 should receive 2 doses of MMR vaccine. People who received inactivated mumps vaccine or an unknown type of mumps vaccine before 1979 and are at high risk for mumps infection should consider immunization with 2 doses of MMR vaccine. Unvaccinated health care workers born before 1957 who lack laboratory evidence of measles, mumps, or rubella immunity or laboratory confirmation of disease should consider measles and mumps immunization with 2 doses of MMR vaccine or rubella immunization with 1 dose of MMR vaccine.  Pneumococcal 13-valent conjugate (PCV13) vaccine. When indicated, a person who is uncertain of his immunization history and has no record of immunization should receive the PCV13 vaccine. An adult aged 19 years or older who has certain medical conditions and has not been previously immunized should receive 1 dose of PCV13 vaccine. This PCV13 should be followed with a dose of pneumococcal polysaccharide (PPSV23) vaccine. The PPSV23 vaccine dose should be obtained at least 8 weeks after the dose of PCV13 vaccine. An adult aged 19 years or older who has certain medical conditions and  previously received 1 or more doses of PPSV23 vaccine should receive 1 dose of PCV13. The PCV13 vaccine dose should be obtained 1 or more years after the last PPSV23 vaccine dose.  Pneumococcal polysaccharide (PPSV23) vaccine. When PCV13 is also indicated, PCV13 should be obtained first. All adults aged 65 years and older should be immunized. An adult younger than age 65 years who has certain medical conditions should be immunized. Any person who resides in a nursing home or long-term care facility should be immunized. An adult smoker should be immunized. People with an immunocompromised condition and certain other conditions should receive both PCV13 and PPSV23 vaccines. People with human immunodeficiency virus (HIV) infection should be immunized as soon as possible after diagnosis. Immunization during chemotherapy or radiation therapy should be avoided. Routine use of PPSV23 vaccine is not recommended for American Indians, [place] Natives, or people younger than 65 years unless there are medical conditions that require PPSV23 vaccine. When indicated, people who have unknown immunization and have no record of immunization should receive PPSV23 vaccine. One-time revaccination 5 years after the first dose of PPSV23 is recommended for people aged 19-64 years who have chronic kidney failure, nephrotic syndrome, asplenia, or immunocompromised conditions. People who received 1-2 doses of PPSV23 before age 65 years should receive another dose of PPSV23 vaccine at age 65 years or later if at least 5 years have passed since the previous dose. Doses of PPSV23 are not needed for people immunized with PPSV23 at or after age 65 years.  Meningococcal vaccine. Adults with asplenia or persistent complement component deficiencies should receive 2 doses of quadrivalent meningococcal conjugate (MenACWY-D) vaccine. The doses should be obtained at least 2 months apart. Microbiologists working with certain meningococcal bacteria,  military recruits, people at risk during an outbreak, and people who travel to or live in countries with a high rate of meningitis should be immunized. A first-year college student up through age 21 years who is living in a residence hall should receive a dose if he did not receive a dose on or after his 16th birthday. Adults who have certain high-risk conditions should receive one or more doses of vaccine.  Hepatitis A vaccine. Adults who wish to be protected from this disease, have certain high-risk conditions, work with hepatitis A-infected animals, work in hepatitis A research labs, or   travel to or work in countries with a high rate of hepatitis A should be immunized. Adults who were previously unvaccinated and who anticipate close contact with an international adoptee during the first 60 days after arrival in the [place] from a country with a high rate of hepatitis A should be immunized.  Hepatitis B vaccine. Adults should be immunized if they wish to be protected from this disease, have certain high-risk conditions, may be exposed to blood or other infectious body fluids, are household contacts or sex partners of hepatitis B positive people, are clients or workers in certain care facilities, or travel to or work in countries with a high rate of hepatitis B.  Haemophilus influenzae type b (Hib) vaccine. A previously unvaccinated person with asplenia or sickle cell disease or having a scheduled splenectomy should receive 1 dose of Hib vaccine. Regardless of previous immunization, a recipient of a hematopoietic stem cell transplant should receive a 3-dose series 6-12 months after his successful transplant. Hib vaccine is not recommended for adults with HIV infection. Preventive Service / Frequency Ages 52 to 17  Blood pressure check.** / Every 1 to 2 years.  Lipid and cholesterol check.** / Every 5 years beginning at age 69.  Hepatitis C blood test.** / For any individual with known risks for  hepatitis C.  Skin self-exam. / Monthly.  Influenza vaccine. / Every year.  Tetanus, diphtheria, and acellular pertussis (Tdap, Td) vaccine.** / Consult your health care provider. 1 dose of Td every 10 years.  Varicella vaccine.** / Consult your health care provider.  HPV vaccine. / 3 doses over 6 months, if 72 or younger.  Measles, mumps, rubella (MMR) vaccine.** / You need at least 1 dose of MMR if you were born in 1957 or later. You may also need a second dose.  Pneumococcal 13-valent conjugate (PCV13) vaccine.** / Consult your health care provider.  Pneumococcal polysaccharide (PPSV23) vaccine.** / 1 to 2 doses if you smoke cigarettes or if you have certain conditions.  Meningococcal vaccine.** / 1 dose if you are age 35 to 60 years and a Market researcher living in a residence hall, or have one of several medical conditions. You may also need additional booster doses.  Hepatitis A vaccine.** / Consult your health care provider.  Hepatitis B vaccine.** / Consult your health care provider.  Haemophilus influenzae type b (Hib) vaccine.** / Consult your health care provider. Ages 35 to 8  Blood pressure check.** / Every 1 to 2 years.  Lipid and cholesterol check.** / Every 5 years beginning at age 57.  Lung cancer screening. / Every year if you are aged 44-80 years and have a 30-pack-year history of smoking and currently smoke or have quit within the past 15 years. Yearly screening is stopped once you have quit smoking for at least 15 years or develop a health problem that would prevent you from having lung cancer treatment.  Fecal occult blood test (FOBT) of stool. / Every year beginning at age 55 and continuing until age 73. You may not have to do this test if you get a colonoscopy every 10 years.  Flexible sigmoidoscopy** or colonoscopy.** / Every 5 years for a flexible sigmoidoscopy or every 10 years for a colonoscopy beginning at age 28 and continuing until age  1.  Hepatitis C blood test.** / For all people born from [birth year] through 1965 and any individual with known risks for hepatitis C.  Skin self-exam. / Monthly.  Influenza vaccine. / Every  year.  Tetanus, diphtheria, and acellular pertussis (Tdap/Td) vaccine.** / Consult your health care provider. 1 dose of Td every 10 years.  Varicella vaccine.** / Consult your health care provider.  Zoster vaccine.** / 1 dose for adults aged 53 years or older.  Measles, mumps, rubella (MMR) vaccine.** / You need at least 1 dose of MMR if you were born in 1957 or later. You may also need a second dose.  Pneumococcal 13-valent conjugate (PCV13) vaccine.** / Consult your health care provider.  Pneumococcal polysaccharide (PPSV23) vaccine.** / 1 to 2 doses if you smoke cigarettes or if you have certain conditions.  Meningococcal vaccine.** / Consult your health care provider.  Hepatitis A vaccine.** / Consult your health care provider.  Hepatitis B vaccine.** / Consult your health care provider.  Haemophilus influenzae type b (Hib) vaccine.** / Consult your health care provider. Ages 77 and over  Blood pressure check.** / Every 1 to 2 years.  Lipid and cholesterol check.**/ Every 5 years beginning at age 85.  Lung cancer screening. / Every year if you are aged 55-80 years and have a 30-pack-year history of smoking and currently smoke or have quit within the past 15 years. Yearly screening is stopped once you have quit smoking for at least 15 years or develop a health problem that would prevent you from having lung cancer treatment.  Fecal occult blood test (FOBT) of stool. / Every year beginning at age 33 and continuing until age 11. You may not have to do this test if you get a colonoscopy every 10 years.  Flexible sigmoidoscopy** or colonoscopy.** / Every 5 years for a flexible sigmoidoscopy or every 10 years for a colonoscopy beginning at age 28 and continuing until age 73.  Hepatitis C blood  test.** / For all people born from [birth year] through 1965 and any individual with known risks for hepatitis C.  Abdominal aortic aneurysm (AAA) screening.** / A one-time screening for ages 50 to 27 years who are current or former smokers.  Skin self-exam. / Monthly.  Influenza vaccine. / Every year.  Tetanus, diphtheria, and acellular pertussis (Tdap/Td) vaccine.** / 1 dose of Td every 10 years.  Varicella vaccine.** / Consult your health care provider.  Zoster vaccine.** / 1 dose for adults aged 34 years or older.  Pneumococcal 13-valent conjugate (PCV13) vaccine.** / Consult your health care provider.  Pneumococcal polysaccharide (PPSV23) vaccine.** / 1 dose for all adults aged 63 years and older.  Meningococcal vaccine.** / Consult your health care provider.  Hepatitis A vaccine.** / Consult your health care provider.  Hepatitis B vaccine.** / Consult your health care provider.  Haemophilus influenzae type b (Hib) vaccine.** / Consult your health care provider. **Family history and personal history of risk and conditions may change your health care provider's recommendations. Document Released: 05/01/2001 Document Revised: 03/10/2013 Document Reviewed: 07/31/2010 [facility] Patient Information 2015 [place], [place]. This information is not intended to replace advice given to you by your health care provider. Make sure you discuss any questions you have with your health care provider.

## 2014-10-28 NOTE — Assessment & Plan Note (Signed)
Encouraged complete cessation. Discussed need to quit as relates to risk of numerous cancers, cardiac and pulmonary disease as well as neurologic complications. Counseled for greater than 3 minutes 

## 2014-10-28 NOTE — Progress Notes (Signed)
Pre visit review using our clinic review tool, if applicable. No additional management support is needed unless otherwise documented below in the visit note. 

## 2014-10-29 ENCOUNTER — Other Ambulatory Visit: Payer: Self-pay | Admitting: Family Medicine

## 2014-10-29 MED ORDER — LISINOPRIL 2.5 MG PO TABS: 2.5000 mg | ORAL_TABLET | Freq: Every day | ORAL | Status: DC

## 2014-10-29 MED ORDER — VITAMIN D (ERGOCALCIFEROL) 1.25 MG (50000 UNIT) PO CAPS: 50000.0000 [IU] | ORAL_CAPSULE | ORAL | Status: DC

## 2014-11-01 LAB — FOLATE RBC

## 2014-11-02 LAB — VITAMIN B1: Vitamin B1 (Thiamine): 8 nmol/L (ref 8–30)

## 2014-11-03 LAB — FOLATE RBC: RBC Folate: 345 ng/mL (ref >280)

## 2014-11-13 NOTE — Assessment & Plan Note (Signed)
Very low on recheck today will restart supplements and moinitor

## 2014-11-13 NOTE — Assessment & Plan Note (Signed)
Good weight loss s/p gastric bypass. Encouraged DASH diet, vitamin levels checked today, only low vitamin D

## 2014-11-13 NOTE — Assessment & Plan Note (Signed)
Testosterone wnl

## 2014-11-13 NOTE — Assessment & Plan Note (Signed)
Well controlled, no changes to meds. Encouraged heart healthy diet such as the DASH diet and exercise as tolerated.  °

## 2014-11-13 NOTE — Assessment & Plan Note (Signed)
S/p gastric bypass, hgba1c is now 5.3. hgba1c acceptable, minimize simple carbs. Increase exercise as tolerated. Continue current meds

## 2014-11-13 NOTE — Assessment & Plan Note (Signed)
Tolerating statin, encouraged heart healthy diet, avoid trans fats, minimize simple carbs and saturated fats. Increase exercise as tolerated 

## 2014-11-13 NOTE — Assessment & Plan Note (Signed)
Patient encouraged to maintain heart healthy diet, regular exercise, adequate sleep. Consider daily probiotics. Take medications as prescribed. Labs ordered and reviewed. Given and reviewed copy of ACP documents from [place] Secretary of State and encouraged to complete and return 

## 2014-11-29 ENCOUNTER — Other Ambulatory Visit: Payer: Self-pay | Admitting: Family Medicine

## 2014-11-29 NOTE — Telephone Encounter (Signed)
Rx printed, MD signed, faxed to [facility] and patient notified via voicemail.

## 2014-11-29 NOTE — Telephone Encounter (Signed)
Patient requesting Valium refill.  Last refill 10/28/14- for 90 and 0 LOV- 10/28/14  CSC signed 10/28/14, no UDS   Please advise.

## 2014-11-30 ENCOUNTER — Telehealth: Payer: Self-pay | Admitting: Family Medicine

## 2014-11-30 NOTE — Telephone Encounter (Signed)
Relation to pt: self  Call back number: [phone] Pharmacy: [id number] - [place], [place] - [address]  Reason for call:  Patient states she has been taking nebivolol (BYSTOLIC) 5 MG tablet  2 pills a day and 2.5 MG was sent over. Patient was extremely upset and stated he is out of this medication

## 2014-12-01 NOTE — Telephone Encounter (Signed)
Please advise on what dose pt should be taking. He is unsure of the dose but knows he took one pill in the morning and one at night and he prefers to have a whole pill. Pt states he does feel his heart is racing since he takes the medication for tachycardia.

## 2014-12-01 NOTE — Telephone Encounter (Signed)
Pt calling again stating that he has been taking this med for 5 years and taking 2 tablets per day (1 morning and 1 at night). He said RX was sent over for 1/2 pill once per day. He said he showed pill bottle to Dr. Abner Greenspan last month when he came in. He is very frustrated stating that we always get it messed up. Please call pt asap. Pt is out of meds. He has been out since 11/28/14.  Best# [phone]

## 2014-12-01 NOTE — Telephone Encounter (Signed)
So has had both prescriptions. His previous dose of 5 mg tabs 1 tab bid was actually active a couple years ago. More recently has been a 1/2 tab daily. We could increase to 5 mg tab 1 tab po daily, disp #90 with 1 rf or #30 with 5 rf. Would need bp check in 2 weeks to reassess.

## 2014-12-02 MED ORDER — NEBIVOLOL HCL 2.5 MG PO TABS: 2.5000 mg | ORAL_TABLET | Freq: Two times a day (BID) | ORAL | Status: DC

## 2014-12-02 NOTE — Telephone Encounter (Signed)
Pt is requesting a call back from you to discuss his medications.

## 2014-12-02 NOTE — Telephone Encounter (Signed)
Pt notified that he will take 2 tablets of 2.5 mg daily. Pt verbalized understanding with no questions or concerns at this time. Pt will also book an appointment in 2 wks for BP check with nurse.

## 2014-12-02 NOTE — Addendum Note (Signed)
Addended by: Baird Kay on: 12/02/2014 09:54 AM   Modules accepted: Orders, Medications

## 2014-12-16 ENCOUNTER — Ambulatory Visit (INDEPENDENT_AMBULATORY_CARE_PROVIDER_SITE_OTHER): Payer: BLUE CROSS/BLUE SHIELD | Admitting: Family Medicine

## 2014-12-16 ENCOUNTER — Encounter: Payer: Self-pay | Admitting: Family Medicine

## 2014-12-16 VITALS — BP 130/82 | HR 67 | Temp 98.4°F | Ht 73.0 in | Wt 168.4 lb

## 2014-12-16 DIAGNOSIS — E785 Hyperlipidemia, unspecified: Secondary | ICD-10-CM

## 2014-12-16 DIAGNOSIS — Z23 Encounter for immunization: Secondary | ICD-10-CM | POA: Diagnosis not present

## 2014-12-16 DIAGNOSIS — I10 Essential (primary) hypertension: Secondary | ICD-10-CM

## 2014-12-16 DIAGNOSIS — Z720 Tobacco use: Secondary | ICD-10-CM

## 2014-12-16 DIAGNOSIS — E559 Vitamin D deficiency, unspecified: Secondary | ICD-10-CM | POA: Diagnosis not present

## 2014-12-16 DIAGNOSIS — Z9884 Bariatric surgery status: Secondary | ICD-10-CM | POA: Diagnosis not present

## 2014-12-16 DIAGNOSIS — F17200 Nicotine dependence, unspecified, uncomplicated: Secondary | ICD-10-CM

## 2014-12-16 MED ORDER — NEBIVOLOL HCL 5 MG PO TABS: 5.0000 mg | ORAL_TABLET | Freq: Two times a day (BID) | ORAL | Status: DC

## 2014-12-16 NOTE — Patient Instructions (Signed)
Nicotine Addiction Nicotine can act as both a stimulant (excites/activates) and a sedative (calms/quiets). Immediately after exposure to nicotine, there is a "kick" caused in part by the drug's stimulation of the adrenal glands and resulting discharge of adrenaline (epinephrine). The rush of adrenaline stimulates the body and causes a sudden release of sugar. This means that smokers are always slightly hyperglycemic. Hyperglycemic means that the blood sugar is high, just like in diabetics. Nicotine also decreases the amount of insulin which helps control sugar levels in the body. There is an increase in blood pressure, breathing, and the rate of heart beats.  In addition, nicotine indirectly causes a release of dopamine in the brain that controls pleasure and motivation. A similar reaction is seen with other drugs of abuse, such as cocaine and heroin. This dopamine release is thought to cause the pleasurable sensations when smoking. In some different cases, nicotine can also create a calming effect, depending on sensitivity of the smoker's nervous system and the dose of nicotine taken. WHAT HAPPENS WHEN NICOTINE IS TAKEN FOR LONG PERIODS OF TIME?  Long-term use of nicotine results in addiction. It is difficult to stop.  Repeated use of nicotine creates tolerance. Higher doses of nicotine are needed to get the "kick." When nicotine use is stopped, withdrawal may last a month or more. Withdrawal may begin within a few hours after the last cigarette. Symptoms peak within the first few days and may lessen within a few weeks. For some people, however, symptoms may last for months or longer. Withdrawal symptoms include:   Irritability.  Craving.  Learning and attention deficits.  Sleep disturbances.  Increased appetite. Craving for tobacco may last for 6 months or longer. Many behaviors done while using nicotine can also play a part in the severity of withdrawal symptoms. For some people, the feel,  smell, and sight of a cigarette and the ritual of obtaining, handling, lighting, and smoking the cigarette are closely linked with the pleasure of smoking. When stopped, they also miss the related behaviors which make the withdrawal or craving worse. While nicotine gum and patches may lessen the drug aspects of withdrawal, cravings often persist. WHAT ARE THE MEDICAL CONSEQUENCES OF NICOTINE USE?  Nicotine addiction accounts for one-third of all cancers. The top cancer caused by tobacco is lung cancer. Lung cancer is the number one cancer killer of both men and women.  Smoking is also associated with cancers of the:  Mouth.  Pharynx.  Larynx.  Esophagus.  Stomach.  Pancreas.  Cervix.  Kidney.  Ureter.  Bladder.  Smoking also causes lung diseases such as lasting (chronic) bronchitis and emphysema.  It worsens asthma in adults and children.  Smoking increases the risk of heart disease, including:  Stroke.  Heart attack.  Vascular disease.  Aneurysm.  Passive or secondary smoke can also increase medical risks including:  Asthma in children.  Sudden Infant Death Syndrome (SIDS).  Additionally, dropped cigarettes are the leading cause of residential fire fatalities.  Nicotine poisoning has been reported from accidental ingestion of tobacco products by children and pets. Death usually results in a few minutes from respiratory failure (when a person stops breathing) caused by paralysis. TREATMENT   Medication. Nicotine replacement medicines such as nicotine gum and the patch are used to stop smoking. These medicines gradually lower the dosage of nicotine in the body. These medicines do not contain the carbon monoxide and other toxins found in tobacco smoke.  Hypnotherapy.  Relaxation therapy.  Nicotine Anonymous (a 12-step support   program). Find times and locations in your local yellow pages. Document Released: 11/09/2003 Document Revised: 05/28/2011 Document  Reviewed: 05/01/2013 Exit[employer] Patient Information 2015 [employer]. This information is not intended to replace advice given to you by your health care provider. Make sure you discuss any questions you have with your health care provider.  

## 2014-12-16 NOTE — Progress Notes (Signed)
Pre visit review using our clinic review tool, if applicable. No additional management support is needed unless otherwise documented below in the visit note. 

## 2014-12-26 ENCOUNTER — Encounter: Payer: Self-pay | Admitting: Family Medicine

## 2014-12-26 NOTE — Assessment & Plan Note (Signed)
Tolerating statin, encouraged heart healthy diet, avoid trans fats, minimize simple carbs and saturated fats. Increase exercise as tolerated 

## 2014-12-26 NOTE — Assessment & Plan Note (Signed)
Well controlled, no changes to meds. Encouraged heart healthy diet such as the DASH diet and exercise as tolerated.  °

## 2014-12-26 NOTE — Assessment & Plan Note (Signed)
Encouraged complete cessation. Discussed need to quit as relates to risk of numerous cancers, cardiac and pulmonary disease as well as neurologic complications. Counseled for greater than 3 minutes. Ordered low dose CT scan

## 2014-12-26 NOTE — Assessment & Plan Note (Signed)
hgba1c acceptable, minimize simple carbs. Increase exercise as tolerated. Continue current meds 

## 2014-12-26 NOTE — Assessment & Plan Note (Signed)
Notably low, start prescripiton 50000 IU weekly an dvitamin D 2000 IU daily

## 2014-12-26 NOTE — Progress Notes (Signed)
Subjective:    Patient ID: Daniel [name], male    DOB: 06-28-1955, 59 y.o.   MRN: [id number]  Chief Complaint  Patient presents with  . Medication Problem    HPI Patient is in today for follow-up on numerous concerns. He needs a refill on his Bystolic. He generally feels well although unfortunately continues to smoke. Denies any recent illness or acute concerns. Denies CP/palp/SOB/HA/congestion/fevers/GI or GU c/o. Taking meds as prescribed  Past Medical History  Diagnosis Date  . Depression   . Anxiety   . Hyperlipidemia   . Hypertension   . COPD (chronic obstructive pulmonary disease) (HCC)   . Sleep apnea   . Obesity   . Osteoarthritis 01/13/2013  . Osteoarthritis     left knee, treating with injections in [facility]    Past Surgical History  Procedure Laterality Date  . Irrigation and debridement sebaceous cyst      back    Family History  Problem Relation Age of Onset  . Cancer Mother     ovarian  . Hyperlipidemia Father   . Hypertension Father   . Heart disease Father     MI 7/19  . Emphysema Father   . Diabetes Maternal Uncle   . Kidney disease Maternal Uncle   . Diabetes Paternal Uncle   . Cancer Paternal Uncle   . Heart disease Maternal Grandmother   . Heart disease Maternal Grandfather   . Stroke Maternal Grandfather   . Heart disease Paternal Grandmother   . Angina Paternal Grandmother   . Heart disease Paternal Grandfather   . Stroke Paternal Grandfather     Social History   Social History  . Marital Status: Single    Spouse Name: N/A  . Number of Children: N/A  . Years of Education: N/A   Occupational History  . Not on file.   Social History Main Topics  . Smoking status: Current Every Day Smoker -- 1.00 packs/day for 40 years    Types: Cigarettes    Last Attempt to Quit: 01/08/2013  . Smokeless tobacco: Never Used     Comment: currently smoking 1/2 ppd.   Patient started back smoking. smokes one pack per day.  . Alcohol Use:  No     Comment: once a month, glass of wine  . Drug Use: No  . Sexual Activity: Not on file   Other Topics Concern  . Not on file   Social History Narrative   Regular exercise-no    Outpatient Prescriptions Prior to Visit  Medication Sig Dispense Refill  . buPROPion (WELLBUTRIN XL) 150 MG 24 hr tablet tid 90 tablet 3  . clobetasol (TEMOVATE) 0.05 % external solution USE TO SCALP AS DIRECTED 50 mL 6  . diazepam (VALIUM) 5 MG tablet TAKE 2 TABLETS EVERY 8 HOURS AS NEEDED FOR ANXIETY 90 tablet 0  . doxycycline (VIBRAMYCIN) 100 MG capsule Take 1 capsule (100 mg total) by mouth 2 (two) times daily. 20 capsule 2  . DULoxetine (CYMBALTA) 30 MG capsule Take 3 capsules (90 mg total) by mouth daily. 90 capsule 2  . lisinopril (PRINIVIL,ZESTRIL) 2.5 MG tablet Take 1 tablet (2.5 mg total) by mouth daily. 30 tablet 3  . Vitamin D, Ergocalciferol, (DRISDOL) 50000 UNITS CAPS capsule Take 1 capsule (50,000 Units total) by mouth every 7 (seven) days. 4 capsule 4  . zolpidem (AMBIEN) 10 MG tablet take 1 tablet by mouth at bedtime if needed 30 tablet 2  . nebivolol (BYSTOLIC) 2.5 MG tablet  Take 1 tablet (2.5 mg total) by mouth 2 (two) times daily. 60 tablet 5  . atorvastatin (LIPITOR) 40 MG tablet Take 1 tablet (40 mg total) by mouth daily. (Patient not taking: Reported on 10/28/2014) 90 tablet 1  . metFORMIN (GLUCOPHAGE) 500 MG tablet TAKE 1 TABLET (500 MG TOTAL) BY MOUTH DAILY WITH BREAKFAST. (Patient not taking: Reported on 10/28/2014) 30 tablet 2   No facility-administered medications prior to visit.    No Known Allergies  Review of Systems  Constitutional: Negative for fever and malaise/fatigue.  HENT: Negative for congestion.   Eyes: Negative for discharge.  Respiratory: Negative for shortness of breath.   Cardiovascular: Negative for chest pain, palpitations and leg swelling.  Gastrointestinal: Negative for nausea and abdominal pain.  Genitourinary: Negative for dysuria.  Musculoskeletal:  Negative for falls.  Skin: Negative for rash.  Neurological: Negative for loss of consciousness and headaches.  Endo/Heme/Allergies: Negative for environmental allergies.  Psychiatric/Behavioral: Negative for depression. The patient is not nervous/anxious.        Objective:    Physical Exam  Constitutional: He is oriented to person, place, and time. He appears well-developed and well-nourished. No distress.  HENT:  Head: Normocephalic and atraumatic.  Nose: Nose normal.  Eyes: Right eye exhibits no discharge. Left eye exhibits no discharge.  Neck: Normal range of motion. Neck supple.  Cardiovascular: Normal rate and regular rhythm.   No murmur heard. Pulmonary/Chest: Effort normal and breath sounds normal.  Abdominal: Soft. Bowel sounds are normal. There is no tenderness.  Musculoskeletal: He exhibits no edema.  Neurological: He is alert and oriented to person, place, and time.  Skin: Skin is warm and dry.  Psychiatric: He has a normal mood and affect.  Nursing note and vitals reviewed.   BP 130/82 mmHg  Pulse 67  Temp(Src) 98.4 F (36.9 C) (Oral)  Ht 6\' 1"  (1.854 m)  Wt 168 lb 6 oz (76.374 kg)  BMI 22.22 kg/m2  SpO2 99% Wt Readings from Last 3 Encounters:  12/16/14 168 lb 6 oz (76.374 kg)  10/28/14 173 lb 4 oz (78.586 kg)  01/13/13 261 lb 1.3 oz (118.425 kg)     Lab Results  Component Value Date   WBC 5.8 10/28/2014   HGB 14.6 10/28/2014   HCT 43.5 10/28/2014   PLT 207.0 10/28/2014   GLUCOSE 91 10/28/2014   CHOL 139 10/28/2014   TRIG 59.0 10/28/2014   HDL 33.60* 10/28/2014   LDLDIRECT 116.5 08/10/2011   LDLCALC 94 10/28/2014   ALT 37 10/28/2014   AST 25 10/28/2014   NA 143 10/28/2014   K 3.5 10/28/2014   CL 107 10/28/2014   CREATININE 0.67 10/28/2014   BUN 11 10/28/2014   CO2 31 10/28/2014   TSH 1.79 10/28/2014   PSA 0.63 09/06/2010   HGBA1C 5.3 10/28/2014   MICROALBUR 2.0* 10/28/2014    Lab Results  Component Value Date   TSH 1.79 10/28/2014    Lab Results  Component Value Date   WBC 5.8 10/28/2014   HGB 14.6 10/28/2014   HCT 43.5 10/28/2014   MCV 90.8 10/28/2014   PLT 207.0 10/28/2014   Lab Results  Component Value Date   NA 143 10/28/2014   K 3.5 10/28/2014   CO2 31 10/28/2014   GLUCOSE 91 10/28/2014   BUN 11 10/28/2014   CREATININE 0.67 10/28/2014   BILITOT 0.6 10/28/2014   ALKPHOS 125* 10/28/2014   AST 25 10/28/2014   ALT 37 10/28/2014   PROT 6.1 10/28/2014  ALBUMIN 3.7 10/28/2014   CALCIUM 8.9 10/28/2014   GFR 129.26 10/28/2014   Lab Results  Component Value Date   CHOL 139 10/28/2014   Lab Results  Component Value Date   HDL 33.60* 10/28/2014   Lab Results  Component Value Date   LDLCALC 94 10/28/2014   Lab Results  Component Value Date   TRIG 59.0 10/28/2014   Lab Results  Component Value Date   CHOLHDL 4 10/28/2014   Lab Results  Component Value Date   HGBA1C 5.3 10/28/2014       Assessment & Plan:   Problem List Items Addressed This Visit    Vitamin D deficiency    Notably low, start prescripiton 50000 IU weekly an dvitamin D 2000 IU daily      Relevant Orders   Vitamin D (25 hydroxy)   TOBACCO ABUSE    Encouraged complete cessation. Discussed need to quit as relates to risk of numerous cancers, cardiac and pulmonary disease as well as neurologic complications. Counseled for greater than 3 minutes. Ordered low dose CT scan      Relevant Orders   CT CHEST LUNG CA SCREEN LOW DOSE W/O CM   Hyperlipidemia    Tolerating statin, encouraged heart healthy diet, avoid trans fats, minimize simple carbs and saturated fats. Increase exercise as tolerated      Relevant Medications   nebivolol (BYSTOLIC) 5 MG tablet   Essential hypertension    Well controlled, no changes to meds. Encouraged heart healthy diet such as the DASH diet and exercise as tolerated.       Relevant Medications   nebivolol (BYSTOLIC) 5 MG tablet    Other Visit Diagnoses    Encounter for immunization    -   Primary    H/O gastric bypass        Relevant Orders    Magnesium       I have discontinued Mr. Trbovich atorvastatin, metFORMIN, and nebivolol. I am also having him start on nebivolol. Additionally, I am having him maintain his zolpidem, DULoxetine, doxycycline, clobetasol, buPROPion, lisinopril, Vitamin D (Ergocalciferol), and diazepam.  Meds ordered this encounter  Medications  . nebivolol (BYSTOLIC) 5 MG tablet    Sig: Take 1 tablet (5 mg total) by mouth 2 (two) times daily.    Dispense:  60 tablet    Refill:  5     Danise Edge, MD

## 2014-12-28 ENCOUNTER — Ambulatory Visit (HOSPITAL_BASED_OUTPATIENT_CLINIC_OR_DEPARTMENT_OTHER)
Admission: RE | Admit: 2014-12-28 | Discharge: 2014-12-28 | Disposition: A | Discharge status: Home / Self Care | Payer: BLUE CROSS/BLUE SHIELD | Source: Ambulatory Visit | Attending: Family Medicine | Admitting: Family Medicine

## 2014-12-28 DIAGNOSIS — J439 Emphysema, unspecified: Secondary | ICD-10-CM | POA: Diagnosis not present

## 2014-12-28 DIAGNOSIS — Z122 Encounter for screening for malignant neoplasm of respiratory organs: Secondary | ICD-10-CM | POA: Insufficient documentation

## 2014-12-28 DIAGNOSIS — F17200 Nicotine dependence, unspecified, uncomplicated: Secondary | ICD-10-CM

## 2014-12-29 ENCOUNTER — Other Ambulatory Visit: Payer: Self-pay | Admitting: Family Medicine

## 2014-12-29 DIAGNOSIS — Z87891 Personal history of nicotine dependence: Secondary | ICD-10-CM

## 2015-01-18 ENCOUNTER — Other Ambulatory Visit: Payer: Self-pay | Admitting: Family Medicine

## 2015-01-18 NOTE — Telephone Encounter (Signed)
Requesting: Diazepam Contract signed on 10/28/14 UDS none Last OV  12/16/14 Last Refill   #90 with 0 refills on 11/29/14  Please Advise

## 2015-01-19 ENCOUNTER — Other Ambulatory Visit: Payer: Self-pay | Admitting: Family Medicine

## 2015-01-20 NOTE — Telephone Encounter (Signed)
Faxed hardcopy for Diazepam to [facility] [address] [place]

## 2015-01-21 ENCOUNTER — Other Ambulatory Visit (INDEPENDENT_AMBULATORY_CARE_PROVIDER_SITE_OTHER): Payer: BLUE CROSS/BLUE SHIELD

## 2015-01-21 DIAGNOSIS — E559 Vitamin D deficiency, unspecified: Secondary | ICD-10-CM | POA: Diagnosis not present

## 2015-01-21 DIAGNOSIS — Z9884 Bariatric surgery status: Secondary | ICD-10-CM

## 2015-01-21 DIAGNOSIS — Z9889 Other specified postprocedural states: Secondary | ICD-10-CM

## 2015-01-21 LAB — MAGNESIUM: MAGNESIUM: 1.8 mg/dL (ref 1.5–2.5)

## 2015-01-21 LAB — VITAMIN D 25 HYDROXY (VIT D DEFICIENCY, FRACTURES): VITD: 13.09 ng/mL — ABNORMAL LOW (ref 30.00–100.00)

## 2015-01-31 ENCOUNTER — Other Ambulatory Visit: Payer: BLUE CROSS/BLUE SHIELD

## 2015-01-31 ENCOUNTER — Other Ambulatory Visit: Payer: Self-pay | Admitting: Family Medicine

## 2015-01-31 NOTE — Telephone Encounter (Signed)
Faxed hardcopy for Zolpidem to [facility] [address] [place]

## 2015-01-31 NOTE — Telephone Encounter (Signed)
Requesting:  Zolpidem Contract   Signed on 10/28/2014 UDS  No UDS Last OV   12/16/2014 Last Refill   #30 with 2 refills on 10/28/2014  Please Advise in the Absence of PCP for refill for Zolpidem, thanks

## 2015-02-01 ENCOUNTER — Other Ambulatory Visit: Payer: Self-pay | Admitting: Family Medicine

## 2015-02-01 ENCOUNTER — Telehealth: Payer: Self-pay | Admitting: Family Medicine

## 2015-02-01 NOTE — Telephone Encounter (Signed)
Patient and pharmacy aware prescription faxed in yesterday 01/31/15

## 2015-02-01 NOTE — Telephone Encounter (Signed)
Called pt to notify zolpidem was faxed in yesterday. Informed pt we received request today for valium as well. Pt said that he is ok on valium no rush. Pt is out of the zolpidem and was there today and told they had no RX. [facility] called [facility] [address] and spoke to Frisbeeameka who stated pt had 2 profiles and it was in the wrong (not used) profile. She will have it filled. Notified pt.

## 2015-02-01 NOTE — Telephone Encounter (Signed)
Faxed hardcopy for Valium to [facility] [address] [place]

## 2015-02-01 NOTE — Telephone Encounter (Signed)
Caller name: Molly MaduroRobert  Relationship to patient: Self  Can be reached: [phone]  Pharmacy: [id number] - [place], [place] - [address]  Reason for call:  Pt called in requesting a refill on his AMBIEN. Pt was very very rude. He suggested coming over to sit to wait for provider to do refill, suggesting that he would rush provider alone. I apologized to pt repeatly and explained that provider was out yesterday but will take care of today.

## 2015-02-01 NOTE — Telephone Encounter (Signed)
Refill of zolpidem done on 01/31/15

## 2015-02-03 ENCOUNTER — Other Ambulatory Visit: Payer: Self-pay | Admitting: Acute Care

## 2015-02-03 DIAGNOSIS — F17210 Nicotine dependence, cigarettes, uncomplicated: Secondary | ICD-10-CM

## 2015-02-17 ENCOUNTER — Other Ambulatory Visit: Payer: Self-pay | Admitting: Family Medicine

## 2015-02-17 NOTE — Telephone Encounter (Signed)
The patient has a boil on his left buttock/side.

## 2015-02-17 NOTE — Telephone Encounter (Signed)
Not sure if he really wants this refill on this antibiotic, please check with patient and find out what his symptoms might be. I did give him refills when I gave it to him

## 2015-02-18 NOTE — Telephone Encounter (Signed)
Patient informed antibiotic sent in.  

## 2015-03-03 ENCOUNTER — Other Ambulatory Visit: Payer: Self-pay | Admitting: Family Medicine

## 2015-03-03 NOTE — Telephone Encounter (Signed)
Requesting: zolpidem Contract  10/28/2014 UDS  none Last OV   12/16/2014 Last Refill  #30 with 0 refills 01/31/2015  Please Advise

## 2015-03-03 NOTE — Telephone Encounter (Signed)
PCP approved. Faxed hardcopy to [facility] [address]

## 2015-03-16 ENCOUNTER — Other Ambulatory Visit: Payer: Self-pay | Admitting: Family Medicine

## 2015-04-04 ENCOUNTER — Other Ambulatory Visit: Payer: Self-pay | Admitting: Family Medicine

## 2015-04-04 NOTE — Telephone Encounter (Signed)
Requesting: Valium Contract   10/28/2014 UDS  None Last OV   12/16/2014 Last Refill   #90 with 0 refills on 02/01/2015  Please Advise

## 2015-04-04 NOTE — Telephone Encounter (Signed)
Faxed hardcopy for Valium to [facility] [address] [place]

## 2015-04-23 ENCOUNTER — Other Ambulatory Visit: Payer: Self-pay | Admitting: Family Medicine

## 2015-05-06 ENCOUNTER — Ambulatory Visit: Payer: BLUE CROSS/BLUE SHIELD | Admitting: Family Medicine

## 2015-05-07 ENCOUNTER — Other Ambulatory Visit: Payer: Self-pay | Admitting: Family Medicine

## 2015-05-12 ENCOUNTER — Encounter: Payer: Self-pay | Admitting: Family Medicine

## 2015-05-12 ENCOUNTER — Ambulatory Visit (INDEPENDENT_AMBULATORY_CARE_PROVIDER_SITE_OTHER): Payer: BLUE CROSS/BLUE SHIELD | Admitting: Family Medicine

## 2015-05-12 VITALS — BP 106/78 | HR 62 | Temp 97.9°F | Ht 73.0 in | Wt 164.5 lb

## 2015-05-12 DIAGNOSIS — Z0000 Encounter for general adult medical examination without abnormal findings: Secondary | ICD-10-CM

## 2015-05-12 DIAGNOSIS — Z23 Encounter for immunization: Secondary | ICD-10-CM | POA: Diagnosis not present

## 2015-05-12 DIAGNOSIS — E785 Hyperlipidemia, unspecified: Secondary | ICD-10-CM | POA: Diagnosis not present

## 2015-05-12 DIAGNOSIS — I10 Essential (primary) hypertension: Secondary | ICD-10-CM | POA: Diagnosis not present

## 2015-05-12 DIAGNOSIS — E349 Endocrine disorder, unspecified: Secondary | ICD-10-CM

## 2015-05-12 DIAGNOSIS — E559 Vitamin D deficiency, unspecified: Secondary | ICD-10-CM

## 2015-05-12 DIAGNOSIS — E118 Type 2 diabetes mellitus with unspecified complications: Secondary | ICD-10-CM | POA: Diagnosis not present

## 2015-05-12 DIAGNOSIS — E291 Testicular hypofunction: Secondary | ICD-10-CM

## 2015-05-12 DIAGNOSIS — K649 Unspecified hemorrhoids: Secondary | ICD-10-CM

## 2015-05-12 DIAGNOSIS — F17200 Nicotine dependence, unspecified, uncomplicated: Secondary | ICD-10-CM

## 2015-05-12 MED ORDER — CENTRUM SILVER PO TABS: 2.0000 | ORAL_TABLET | Freq: Every day | ORAL | Status: DC

## 2015-05-12 MED ORDER — VITAMIN E 45 MG (100 UNIT) PO CAPS: 100.0000 [IU] | ORAL_CAPSULE | Freq: Every day | ORAL | Status: DC

## 2015-05-12 MED ORDER — DIAZEPAM 5 MG PO TABS: ORAL_TABLET | ORAL | Status: DC

## 2015-05-12 MED ORDER — OXYCODONE-ACETAMINOPHEN 5-325 MG PO TABS: 1.0000 | ORAL_TABLET | Freq: Three times a day (TID) | ORAL | Status: DC | PRN

## 2015-05-12 MED ORDER — SILDENAFIL CITRATE 100 MG PO TABS: 50.0000 mg | ORAL_TABLET | Freq: Every day | ORAL | Status: DC | PRN

## 2015-05-12 MED ORDER — LISINOPRIL 2.5 MG PO TABS: ORAL_TABLET | ORAL | Status: DC

## 2015-05-12 MED ORDER — VITAMIN A-BETA CAROTENE 25000 UNITS PO CAPS: 25000.0000 [IU] | ORAL_CAPSULE | Freq: Every day | ORAL | Status: DC

## 2015-05-12 NOTE — Progress Notes (Signed)
Pre visit review using our clinic review tool, if applicable. No additional management support is needed unless otherwise documented below in the visit note. 

## 2015-05-12 NOTE — Patient Instructions (Signed)

## 2015-05-13 ENCOUNTER — Other Ambulatory Visit: Payer: Self-pay | Admitting: Family Medicine

## 2015-05-13 LAB — COMPREHENSIVE METABOLIC PANEL
ALK PHOS: 114 U/L (ref 39–117)
ALT: 49 U/L (ref 0–53)
AST: 40 U/L — ABNORMAL HIGH (ref 0–37)
Albumin: 4 g/dL (ref 3.5–5.2)
BILIRUBIN TOTAL: 0.4 mg/dL (ref 0.2–1.2)
BUN: 10 mg/dL (ref 6–23)
CALCIUM: 8.7 mg/dL (ref 8.4–10.5)
CO2: 32 mEq/L (ref 19–32)
Chloride: 106 mEq/L (ref 96–112)
Creatinine, Ser: 0.71 mg/dL (ref 0.40–1.50)
GFR: 120.67 mL/min (ref >60.00)
Glucose, Bld: 98 mg/dL (ref 70–99)
POTASSIUM: 3.7 meq/L (ref 3.5–5.1)
Sodium: 141 mEq/L (ref 135–145)
TOTAL PROTEIN: 6.2 g/dL (ref 6.0–8.3)

## 2015-05-13 LAB — HEPATITIS C ANTIBODY: HCV AB: NEGATIVE

## 2015-05-13 LAB — HIV ANTIBODY (ROUTINE TESTING W REFLEX): HIV: NONREACTIVE

## 2015-05-13 LAB — LIPID PANEL
CHOLESTEROL: 142 mg/dL (ref 0–200)
HDL: 42.6 mg/dL (ref >39.00)
LDL Cholesterol: 87 mg/dL (ref 0–99)
NonHDL: 99.02
TRIGLYCERIDES: 58 mg/dL (ref 0.0–149.0)
Total CHOL/HDL Ratio: 3
VLDL: 11.6 mg/dL (ref 0.0–40.0)

## 2015-05-13 LAB — CBC
HEMATOCRIT: 42.7 % (ref 39.0–52.0)
HEMOGLOBIN: 14.4 g/dL (ref 13.0–17.0)
MCHC: 33.9 g/dL (ref 30.0–36.0)
MCV: 93.2 fl (ref 78.0–100.0)
PLATELETS: 228 10*3/uL (ref 150.0–400.0)
RBC: 4.58 Mil/uL (ref 4.22–5.81)
RDW: 14.7 % (ref 11.5–15.5)
WBC: 6.4 10*3/uL (ref 4.0–10.5)

## 2015-05-13 LAB — VITAMIN D 25 HYDROXY (VIT D DEFICIENCY, FRACTURES): VITD: 11.21 ng/mL — ABNORMAL LOW (ref 30.00–100.00)

## 2015-05-13 LAB — TSH: TSH: 2.04 u[IU]/mL (ref 0.35–4.50)

## 2015-05-13 LAB — HEMOGLOBIN A1C: Hgb A1c MFr Bld: 5.2 % (ref 4.6–6.5)

## 2015-05-13 MED ORDER — VITAMIN D (ERGOCALCIFEROL) 1.25 MG (50000 UNIT) PO CAPS: 50000.0000 [IU] | ORAL_CAPSULE | ORAL | Status: DC

## 2015-05-19 ENCOUNTER — Other Ambulatory Visit: Payer: Self-pay | Admitting: Surgery

## 2015-05-22 ENCOUNTER — Encounter: Payer: Self-pay | Admitting: Family Medicine

## 2015-05-22 DIAGNOSIS — K649 Unspecified hemorrhoids: Secondary | ICD-10-CM | POA: Insufficient documentation

## 2015-05-22 HISTORY — DX: Unspecified hemorrhoids: K64.9

## 2015-05-22 NOTE — Assessment & Plan Note (Signed)
Labs reveal deficiency. Start on Vitamin D 50000 IU caps, 1 cap po weekly x 12 weeks. Disp #4 with 4 rf. Also take daily Vitamin D over the counter. If already taking a daily supplement increase by 1000 IU daily and if not start Vitamin D 2000 IU daily.  

## 2015-05-22 NOTE — Assessment & Plan Note (Signed)
Well controlled, no changes to meds. Encouraged heart healthy diet such as the DASH diet and exercise as tolerated.  °

## 2015-05-22 NOTE — Assessment & Plan Note (Signed)
Encouraged heart healthy diet, increase exercise, avoid trans fats, consider a krill oil cap daily 

## 2015-05-22 NOTE — Progress Notes (Signed)
Patient ID: Daniel [name], male   DOB: March 25, 1955, 60 y.o.   MRN: [id number]   Subjective:    Patient ID: Daniel [name], male    DOB: [birth year], 60 y.o.   MRN: [id number]  Chief Complaint  Patient presents with  . Hemorrhoids  . Cyst    buttocks    HPI Patient is in today for evaluation of rectal bleeding and follow up on numerous concerns. He has had some irritated hemorrhoids and rectal bleeding intermittently recently. None today. Has had thrombosed hermorhoids in past but he is not suffering from any pain and active bleeding at this time. He has an appt with Dr Larey Dresser of [facility] next week. No abdominal pain, anorexia, constipation, diarrhea or other acute complaints. Denies CP/palp/SOB/HA/congestion/fevers or GU c/o. Taking meds as prescribed  Past Medical History  Diagnosis Date  . Depression   . Anxiety   . Hyperlipidemia   . Hypertension   . COPD (chronic obstructive pulmonary disease) (HCC)   . Sleep apnea   . Obesity   . Osteoarthritis 01/13/2013  . Osteoarthritis     left knee, treating with injections in [facility]  . Hemorrhoids 05/22/2015    Past Surgical History  Procedure Laterality Date  . Irrigation and debridement sebaceous cyst      back    Family History  Problem Relation Age of Onset  . Cancer Mother     ovarian  . Hyperlipidemia Father   . Hypertension Father   . Heart disease Father     MI 7/19  . Emphysema Father   . Diabetes Maternal Uncle   . Kidney disease Maternal Uncle   . Diabetes Paternal Uncle   . Cancer Paternal Uncle   . Heart disease Maternal Grandmother   . Heart disease Maternal Grandfather   . Stroke Maternal Grandfather   . Heart disease Paternal Grandmother   . Angina Paternal Grandmother   . Heart disease Paternal Grandfather   . Stroke Paternal Grandfather     Social History   Social History  . Marital Status: Single    Spouse Name: N/A  . Number of Children: N/A  . Years of Education: N/A   Occupational  History  . Not on file.   Social History Main Topics  . Smoking status: Current Every Day Smoker -- 1.00 packs/day for 40 years    Types: Cigarettes    Last Attempt to Quit: 01/08/2013  . Smokeless tobacco: Never Used     Comment: currently smoking 1/2 ppd.   Patient started back smoking. smokes one pack per day.  . Alcohol Use: No     Comment: once a month, glass of wine  . Drug Use: No  . Sexual Activity: Not on file   Other Topics Concern  . Not on file   Social History Narrative   Regular exercise-no    Outpatient Prescriptions Prior to Visit  Medication Sig Dispense Refill  . buPROPion (WELLBUTRIN XL) 150 MG 24 hr tablet TAKE 1 TABLET THREE TIMES A DAY 90 tablet 3  . clobetasol (TEMOVATE) 0.05 % external solution USE TO SCALP AS DIRECTED 50 mL 6  . DULoxetine (CYMBALTA) 30 MG capsule TAKE 3 CAPSULES (90 MG TOTAL) BY MOUTH DAILY. 90 capsule 2  . nebivolol (BYSTOLIC) 5 MG tablet Take 1 tablet (5 mg total) by mouth 2 (two) times daily. 60 tablet 5  . zolpidem (AMBIEN) 10 MG tablet TAKE 1 TABLET BY MOUTH AT BEDTIME AS NEEDED 30 tablet 2  .  diazepam (VALIUM) 5 MG tablet TAKE 2 TABLETS BY MOUTH EVERY 8 HOURS AS NEEDED FOR ANXIETY 90 tablet 0  . doxycycline (VIBRAMYCIN) 100 MG capsule TAKE 1 CAPSULE (100 MG TOTAL) BY MOUTH 2 (TWO) TIMES DAILY. 20 capsule 2  . lisinopril (PRINIVIL,ZESTRIL) 2.5 MG tablet TAKE 1 TABLET (2.5 MG TOTAL) BY MOUTH DAILY. 30 tablet 3  . Vitamin D, Ergocalciferol, (DRISDOL) 50000 UNITS CAPS capsule Take 1 capsule (50,000 Units total) by mouth every 7 (seven) days. 4 capsule 4   No facility-administered medications prior to visit.    No Known Allergies  Review of Systems  Constitutional: Negative for fever and malaise/fatigue.  HENT: Negative for congestion.   Eyes: Negative for discharge.  Respiratory: Negative for shortness of breath.   Cardiovascular: Negative for chest pain, palpitations and leg swelling.  Gastrointestinal: Positive for blood in  stool. Negative for nausea, abdominal pain, diarrhea, constipation and melena.  Genitourinary: Negative for dysuria.  Musculoskeletal: Negative for falls.  Skin: Negative for rash.  Neurological: Negative for loss of consciousness and headaches.  Endo/Heme/Allergies: Negative for environmental allergies.  Psychiatric/Behavioral: Negative for depression. The patient is not nervous/anxious.        Objective:    Physical Exam  Constitutional: He is oriented to person, place, and time. He appears well-developed and well-nourished. No distress.  HENT:  Head: Normocephalic and atraumatic.  Nose: Nose normal.  Eyes: Right eye exhibits no discharge. Left eye exhibits no discharge.  Neck: Normal range of motion. Neck supple.  Cardiovascular: Normal rate and regular rhythm.   No murmur heard. Pulmonary/Chest: Effort normal and breath sounds normal.  Abdominal: Soft. Bowel sounds are normal. There is no tenderness.  Genitourinary:  External hemorrhoids noted, no inflammation  Musculoskeletal: He exhibits no edema.  Neurological: He is alert and oriented to person, place, and time.  Skin: Skin is warm and dry.  Psychiatric: He has a normal mood and affect.  Nursing note and vitals reviewed.   BP 106/78 mmHg  Pulse 62  Temp(Src) 97.9 F (36.6 C) (Oral)  Ht 6\' 1"  (1.854 m)  Wt 164 lb 8 oz (74.617 kg)  BMI 21.71 kg/m2  SpO2 95% Wt Readings from Last 3 Encounters:  05/12/15 164 lb 8 oz (74.617 kg)  12/16/14 168 lb 6 oz (76.374 kg)  10/28/14 173 lb 4 oz (78.586 kg)     Lab Results  Component Value Date   WBC 6.4 05/12/2015   HGB 14.4 05/12/2015   HCT 42.7 05/12/2015   PLT 228.0 05/12/2015   GLUCOSE 98 05/12/2015   CHOL 142 05/12/2015   TRIG 58.0 05/12/2015   HDL 42.60 05/12/2015   LDLDIRECT 116.5 08/10/2011   LDLCALC 87 05/12/2015   ALT 49 05/12/2015   AST 40* 05/12/2015   NA 141 05/12/2015   K 3.7 05/12/2015   CL 106 05/12/2015   CREATININE 0.71 05/12/2015   BUN 10  05/12/2015   CO2 32 05/12/2015   TSH 2.04 05/12/2015   PSA 0.63 09/06/2010   HGBA1C 5.2 05/12/2015   MICROALBUR 2.0* 10/28/2014    Lab Results  Component Value Date   TSH 2.04 05/12/2015   Lab Results  Component Value Date   WBC 6.4 05/12/2015   HGB 14.4 05/12/2015   HCT 42.7 05/12/2015   MCV 93.2 05/12/2015   PLT 228.0 05/12/2015   Lab Results  Component Value Date   NA 141 05/12/2015   K 3.7 05/12/2015   CO2 32 05/12/2015   GLUCOSE 98 05/12/2015  BUN 10 05/12/2015   CREATININE 0.71 05/12/2015   BILITOT 0.4 05/12/2015   ALKPHOS 114 05/12/2015   AST 40* 05/12/2015   ALT 49 05/12/2015   PROT 6.2 05/12/2015   ALBUMIN 4.0 05/12/2015   CALCIUM 8.7 05/12/2015   GFR 120.67 05/12/2015   Lab Results  Component Value Date   CHOL 142 05/12/2015   Lab Results  Component Value Date   HDL 42.60 05/12/2015   Lab Results  Component Value Date   LDLCALC 87 05/12/2015   Lab Results  Component Value Date   TRIG 58.0 05/12/2015   Lab Results  Component Value Date   CHOLHDL 3 05/12/2015   Lab Results  Component Value Date   HGBA1C 5.2 05/12/2015       Assessment & Plan:   Problem List Items Addressed This Visit    DM (diabetes mellitus) (HCC)    hgba1c acceptable, minimize simple carbs. Increase exercise as tolerated.       Relevant Medications   lisinopril (PRINIVIL,ZESTRIL) 2.5 MG tablet   Other Relevant Orders   TSH (Completed)   CBC (Completed)   Comprehensive metabolic panel (Completed)   Lipid panel (Completed)   Vitamin D (25 hydroxy) (Completed)   Hemoglobin A1c (Completed)   Essential hypertension    Well controlled, no changes to meds. Encouraged heart healthy diet such as the DASH diet and exercise as tolerated.       Relevant Medications   lisinopril (PRINIVIL,ZESTRIL) 2.5 MG tablet   sildenafil (VIAGRA) 100 MG tablet   Other Relevant Orders   TSH (Completed)   CBC (Completed)   Comprehensive metabolic panel (Completed)   Lipid panel  (Completed)   Vitamin D (25 hydroxy) (Completed)   Hemoglobin A1c (Completed)   Hemorrhoids    No active inflammation or infection may use anusol suppository if they flared before appt he already has next week with surgeon, Dr Magnus Ivan      Relevant Medications   lisinopril (PRINIVIL,ZESTRIL) 2.5 MG tablet   sildenafil (VIAGRA) 100 MG tablet   Hyperlipidemia - Primary    Encouraged heart healthy diet, increase exercise, avoid trans fats, consider a krill oil cap daily      Relevant Medications   lisinopril (PRINIVIL,ZESTRIL) 2.5 MG tablet   sildenafil (VIAGRA) 100 MG tablet   Other Relevant Orders   TSH (Completed)   CBC (Completed)   Comprehensive metabolic panel (Completed)   Lipid panel (Completed)   Vitamin D (25 hydroxy) (Completed)   Hemoglobin A1c (Completed)   Hypotestosteronemia   Relevant Orders   TSH (Completed)   CBC (Completed)   Comprehensive metabolic panel (Completed)   Lipid panel (Completed)   Vitamin D (25 hydroxy) (Completed)   Hemoglobin A1c (Completed)   TOBACCO ABUSE    Encouraged complete cessation. Discussed need to quit as relates to risk of numerous cancers, cardiac and pulmonary disease as well as neurologic complications. Counseled for greater than 3 minutes      Vitamin D deficiency    Labs reveal deficiency. Start on Vitamin D 65784 IU caps, 1 cap po weekly x 12 weeks. Disp #4 with 4 rf. Also take daily Vitamin D over the counter. If already taking a daily supplement increase by 1000 IU daily and if not start Vitamin D 2000 IU daily.        Other Visit Diagnoses    Preventative health care        Relevant Orders    HIV antibody (with reflex) (Completed)    Hepatitis  C antibody (Completed)    Need for pneumococcal vaccination        Relevant Orders    Pneumococcal polysaccharide vaccine 23-valent greater than or equal to 2yo subcutaneous/IM       I have discontinued Mr. Schnitzer Vitamin D (Ergocalciferol) and doxycycline. I am also  having him start on CENTRUM SILVER, vitamin A, vitamin E, oxyCODONE-acetaminophen, and sildenafil. Additionally, I am having him maintain his clobetasol, nebivolol, zolpidem, DULoxetine, buPROPion, diazepam, and lisinopril.  Meds ordered this encounter  Medications  . diazepam (VALIUM) 5 MG tablet    Sig: TAKE 2 TABLETS BY MOUTH EVERY 8 HOURS AS NEEDED FOR ANXIETY    Dispense:  90 tablet    Refill:  5  . lisinopril (PRINIVIL,ZESTRIL) 2.5 MG tablet    Sig: TAKE 1 TABLET (2.5 MG TOTAL) BY MOUTH DAILY.    Dispense:  30 tablet    Refill:  11  . Multiple Vitamins-Minerals (CENTRUM SILVER) tablet    Sig: Take 2 tablets by mouth daily.    Instructed by [facility]  . Beta Carotene (VITAMIN A) 25000 UNIT capsule    Sig: Take 1 capsule (25,000 Units total) by mouth daily.  . vitamin E 100 UNIT capsule    Sig: Take 1 capsule (100 Units total) by mouth daily.  Marland Kitchen oxyCODONE-acetaminophen (ROXICET) 5-325 MG tablet    Sig: Take 1 tablet by mouth every 8 (eight) hours as needed for severe pain.    Dispense:  60 tablet    Refill:  0  . sildenafil (VIAGRA) 100 MG tablet    Sig: Take 0.5-1 tablets (50-100 mg total) by mouth daily as needed for erectile dysfunction.    Dispense:  10 tablet    Refill:  2     Danise Edge, MD

## 2015-05-22 NOTE — Assessment & Plan Note (Signed)
No active inflammation or infection may use anusol suppository if they flared before appt he already has next week with surgeon, Dr Magnus IvanBlackman

## 2015-05-22 NOTE — Assessment & Plan Note (Signed)
Encouraged complete cessation. Discussed need to quit as relates to risk of numerous cancers, cardiac and pulmonary disease as well as neurologic complications. Counseled for greater than 3 minutes 

## 2015-05-22 NOTE — Assessment & Plan Note (Signed)
hgba1c acceptable, minimize simple carbs. Increase exercise as tolerated.  

## 2015-05-31 ENCOUNTER — Other Ambulatory Visit: Payer: Self-pay | Admitting: Family Medicine

## 2015-05-31 NOTE — Telephone Encounter (Signed)
Requesting: Zolpidem Contract  10/28/2014 UDS  none Last OV   05/12/2015 Last Refill   #30 with 2 refills on 03/03/2015  Please Advise

## 2015-05-31 NOTE — Telephone Encounter (Signed)
[facility]

## 2015-05-31 NOTE — Telephone Encounter (Signed)
Faxed hardcopy for Zolpidem to

## 2015-06-03 LAB — HM DIABETES EYE EXAM

## 2015-06-07 ENCOUNTER — Encounter: Payer: Self-pay | Admitting: Family Medicine

## 2015-06-16 ENCOUNTER — Ambulatory Visit: Payer: BLUE CROSS/BLUE SHIELD | Admitting: Family Medicine

## 2015-06-18 ENCOUNTER — Other Ambulatory Visit: Payer: Self-pay | Admitting: Family Medicine

## 2015-06-29 ENCOUNTER — Ambulatory Visit (HOSPITAL_BASED_OUTPATIENT_CLINIC_OR_DEPARTMENT_OTHER)
Admission: RE | Admit: 2015-06-29 | Discharge: 2015-06-29 | Disposition: A | Discharge status: Home / Self Care | Payer: BLUE CROSS/BLUE SHIELD | Source: Ambulatory Visit | Attending: Family Medicine | Admitting: Family Medicine

## 2015-06-29 ENCOUNTER — Encounter: Payer: Self-pay | Admitting: Family Medicine

## 2015-06-29 ENCOUNTER — Ambulatory Visit (INDEPENDENT_AMBULATORY_CARE_PROVIDER_SITE_OTHER): Payer: BLUE CROSS/BLUE SHIELD | Admitting: Family Medicine

## 2015-06-29 VITALS — BP 128/82 | HR 62 | Temp 98.7°F | Resp 18 | Ht 73.0 in | Wt 159.8 lb

## 2015-06-29 DIAGNOSIS — M79642 Pain in left hand: Secondary | ICD-10-CM | POA: Insufficient documentation

## 2015-06-29 DIAGNOSIS — M79641 Pain in right hand: Secondary | ICD-10-CM | POA: Insufficient documentation

## 2015-06-29 DIAGNOSIS — M25649 Stiffness of unspecified hand, not elsewhere classified: Secondary | ICD-10-CM

## 2015-06-29 DIAGNOSIS — L649 Androgenic alopecia, unspecified: Secondary | ICD-10-CM | POA: Diagnosis not present

## 2015-06-29 LAB — C-REACTIVE PROTEIN: CRP: <0.1 mg/dL — ABNORMAL LOW (ref 0.5–20.0)

## 2015-06-29 LAB — SEDIMENTATION RATE: Sed Rate: 6 mm/hr (ref 0–22)

## 2015-06-29 LAB — RHEUMATOID FACTOR: Rhuematoid fact SerPl-aCnc: <10 IU/mL (ref <=14)

## 2015-06-29 MED ORDER — FINASTERIDE 1 MG PO TABS: 1.0000 mg | ORAL_TABLET | Freq: Every day | ORAL | Status: DC

## 2015-06-29 MED ORDER — MELOXICAM 15 MG PO TABS: ORAL_TABLET | ORAL | Status: DC

## 2015-06-29 NOTE — Progress Notes (Signed)
Pre visit review using our clinic review tool, if applicable. No additional management support is needed unless otherwise documented below in the visit note. 

## 2015-06-29 NOTE — Patient Instructions (Signed)
Please go downstairs for x-rays, then you can go home. I will be in touch with your results We will also make sure that here is no sign of RA on your labs You can start the meloxicam as needed for pain- tylenol can also be helpful for when the pain is not as severe I will be in touch with your labs and x-ay report.  Please keep me posted as to your progress

## 2015-06-29 NOTE — Progress Notes (Signed)
[facility] [address] [place], [place] [address] [phone]  Date:  06/29/2015   Name:  Daniel [name]   DOB:  1955/11/03   MRN:  [id number]  PCP:  Danise Edge, MD    Chief Complaint: Hand Pain   History of Present Illness:  Daniel [name] is a 60 y.o. very pleasant male patient who presents with the following:  History of diet controlled DM, anxiety.  He has a concern of hand pain.  Last fall he noted some pain in the IP joints of both thumbs and in the MCPs as well.  Got better but then his sx came back.   It would only hurt in the first am- as he warmed up it would get better.   He now notes more persistent stiffness and swelling of his left fingers. When he first gets up in the am they will be very stiff. It may take an hour or two to improve in the morning.    The right hand is ok now- sx just in the left He is right handed He does not use any warm water on his hands Using his hands a lot does not make them feel worse- he is at his worst when he wakes up in the morning.  He notes soreness when he bends his fingers No other bad joints. 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 He would like to use this   Lab Results  Component Value Date   HGBA1C 5.2 05/12/2015     Patient Active Problem List   Diagnosis Date Noted  . Hemorrhoids 05/22/2015  . Osteoarthritis 01/13/2013  . Unsteadiness 02/27/2012  . Prostatitis 08/22/2011  . DM (diabetes mellitus) (HCC) 05/21/2011  . Vitamin D deficiency 05/21/2011  . Constipation 01/12/2011  . Scrotal cyst 12/18/2010  . Annual physical exam 09/17/2010  . Shoulder pain, left 07/16/2010  . Low back pain 07/16/2010  . INSOMNIA 03/16/2010  . OBSTRUCTIVE SLEEP  APNEA 02/20/2010  . TOBACCO ABUSE 01/26/2010  . PERIPHERAL EDEMA 01/26/2010  . SNORING 01/26/2010  . Hypotestosteronemia 03/24/2009  . Hyperlipidemia 03/24/2009  . ANXIETY DEPRESSION 03/24/2009  . Essential hypertension 03/24/2009    Past Medical History  Diagnosis Date  . Depression   . Anxiety   . Hyperlipidemia   . Hypertension   . COPD (chronic obstructive pulmonary disease) (HCC)   . Sleep apnea   . Obesity   . Osteoarthritis 01/13/2013  . Osteoarthritis     left knee, treating with injections in [facility]  . Hemorrhoids 05/22/2015    Past Surgical History  Procedure Laterality Date  . Irrigation and debridement sebaceous cyst      back    Social History  Substance Use Topics  . Smoking status: Current Every Day Smoker -- 1.00 packs/day for 40 years    Types: Cigarettes    Last Attempt to Quit: 01/08/2013  . Smokeless tobacco: Never Used     Comment: 3-4 cigarettes daily.   Patient started back smoking. smokes one pack per day.  . Alcohol Use: No     Comment: once a month, glass of wine    Family History  Problem Relation Age of Onset  . Cancer Mother  ovarian  . Hyperlipidemia Father   . Hypertension Father   . Heart disease Father     MI 7/19  . Emphysema Father   . Diabetes Maternal Uncle   . Kidney disease Maternal Uncle   . Diabetes Paternal Uncle   . Cancer Paternal Uncle   . Heart disease Maternal Grandmother   . Heart disease Maternal Grandfather   . Stroke Maternal Grandfather   . Heart disease Paternal Grandmother   . Angina Paternal Grandmother   . Heart disease Paternal Grandfather   . Stroke Paternal Grandfather     No Known Allergies  Medication list has been reviewed and updated.  Current Outpatient Prescriptions on File Prior to Visit  Medication Sig Dispense Refill  . Beta Carotene (VITAMIN A) 25000 UNIT capsule Take 1 capsule (25,000 Units total) by mouth daily.    Marland Kitchen buPROPion (WELLBUTRIN XL) 150 MG 24 hr tablet  TAKE 1 TABLET THREE TIMES A DAY 90 tablet 3  . BYSTOLIC 5 MG tablet TAKE 1 TABLET BY MOUTH TWICE A DAY 60 tablet 4  . clobetasol (TEMOVATE) 0.05 % external solution USE TO SCALP AS DIRECTED 50 mL 6  . diazepam (VALIUM) 5 MG tablet TAKE 2 TABLETS BY MOUTH EVERY 8 HOURS AS NEEDED FOR ANXIETY 90 tablet 5  . DULoxetine (CYMBALTA) 30 MG capsule TAKE 3 CAPSULES (90 MG TOTAL) BY MOUTH DAILY. 90 capsule 2  . lisinopril (PRINIVIL,ZESTRIL) 2.5 MG tablet TAKE 1 TABLET (2.5 MG TOTAL) BY MOUTH DAILY. 30 tablet 11  . Multiple Vitamins-Minerals (CENTRUM SILVER) tablet Take 2 tablets by mouth daily.    . sildenafil (VIAGRA) 100 MG tablet Take 0.5-1 tablets (50-100 mg total) by mouth daily as needed for erectile dysfunction. 10 tablet 2  . Vitamin D, Ergocalciferol, (DRISDOL) 50000 units CAPS capsule Take 1 capsule (50,000 Units total) by mouth every 7 (seven) days. 4 capsule 4  . vitamin E 100 UNIT capsule Take 1 capsule (100 Units total) by mouth daily.    Marland Kitchen zolpidem (AMBIEN) 10 MG tablet TAKE 1 TABLET BY MOUTH AT BEDTIME AS NEEDED 30 tablet 2  . [DISCONTINUED] testosterone cypionate (DEPO-TESTOSTERONE) 200 MG/ML injection Inject 100 mg into the muscle once a week.       No current facility-administered medications on file prior to visit.    Review of Systems:  As per HPI- otherwise negative.   Physical Examination: Filed Vitals:   06/29/15 1129 06/29/15 1135  BP: 130/90 128/82  Pulse: 62   Temp: 98.7 F (37.1 C)   Resp: 18    Filed Vitals:   06/29/15 1129  Height: 6\' 1"  (1.854 m)  Weight: 159 lb 12.8 oz (72.485 kg)   Body mass index is 21.09 kg/(m^2). Ideal Body Weight: Weight in (lb) to have BMI = 25: 189.1  GEN: WDWN, NAD, Non-toxic, A & O x 3, normal weight, looks well HEENT: Atraumatic, Normocephalic. Neck supple. No masses, No LAD. Ears and Nose: No external deformity. CV: RRR, No M/G/R. No JVD. No thrill. No extra heart sounds. PULM: CTA B, no wheezes, crackles, rhonchi. No  retractions. No resp. distress. No accessory muscle use. ABD: S, NT, ND, +BS. No rebound. No HSM. EXTR: No c/c/e NEURO Normal gait.  PSYCH: Normally interactive. Conversant. Not depressed or anxious appearing.  Calm demeanor.  Hands:no visible swelling of the joints or unusual nodules. He has slight stiffness at the PIP and DIP joints of the hands, more on the left.  No acute tenderness or redness.  Dg Hand 2 View Right  06/29/2015  CLINICAL DATA:  Left hand pain for 1 week, no injury EXAM: RIGHT HAND - 2 VIEW COMPARISON:  None. FINDINGS: The radiocarpal joint space appears normal. The carpal bones are normal position. MCP, PIP, and DIP joints are unremarkable. No erosion is seen. IMPRESSION: Negative. Electronically Signed   By: Dwyane Dee M.D.   On: 06/29/2015 12:53   Dg Hand 2 View Left  06/29/2015  CLINICAL DATA:  Hand pain for 1 week, no injury EXAM: LEFT HAND - 2 VIEW COMPARISON:  None. FINDINGS: The radiocarpal joint space appears normal and the ulnar styloid is intact. The carpal bones are normal position. MCP, PIP, and DIP joints are unremarkable. No erosion is seen. There is some irregularity through the base of the distal phalanx of the left thumb and and acute or subacute fracture cannot be excluded. Clinical correlation is recommended. IMPRESSION: No acute abnormality. An acute or subacute injury of the base of the distal phalanx of the left thumb cannot be excluded. Electronically Signed   By: Dwyane Dee M.D.   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  Labs are still pending.  No evidence of significant degenerative change on plain  films. He is not aware of any injury to the thumb and does not have any acute pain so we assume the finding seen above is old.  He will start on mobic and/ or tylenol OTC as needed and I will be in touch with his labs   Signed Abbe Amsterdam, MD

## 2015-06-30 LAB — CYCLIC CITRUL PEPTIDE ANTIBODY, IGG: Cyclic Citrullin Peptide Ab: <16 Units

## 2015-07-02 ENCOUNTER — Encounter: Payer: Self-pay | Admitting: Family Medicine

## 2015-07-13 ENCOUNTER — Other Ambulatory Visit: Payer: Self-pay | Admitting: Family Medicine

## 2015-07-25 ENCOUNTER — Telehealth: Payer: Self-pay | Admitting: Family Medicine

## 2015-07-25 DIAGNOSIS — M79642 Pain in left hand: Principal | ICD-10-CM

## 2015-07-25 DIAGNOSIS — M79641 Pain in right hand: Secondary | ICD-10-CM

## 2015-07-25 NOTE — Telephone Encounter (Signed)
Caller name: Molly MaduroRobert Relationship to patient: self Can be reached: [phone]   Reason for call: Pt stating continued pain in fingers and that the meds from 06/29/15 (saw Dr. Patsy Lageropland) are not helping. He feels he needs referral to a specialist. Pt request phone call.

## 2015-07-25 NOTE — Telephone Encounter (Signed)
Called him- he is still having a lot of stiffness in his hands- this is worse in the mornings. The mobic is not helping him.  His labs did not suggest RA but his sx are suspicious. Will refer to ortho for eval as this will be a lot faster than trying to get him into rheum. Explained that the orthopedist may have him see a rheumatologist after all

## 2015-07-28 ENCOUNTER — Encounter: Payer: Self-pay | Admitting: Family Medicine

## 2015-08-02 DIAGNOSIS — M19042 Primary osteoarthritis, left hand: Secondary | ICD-10-CM | POA: Diagnosis not present

## 2015-08-22 ENCOUNTER — Other Ambulatory Visit: Payer: Self-pay | Admitting: Family Medicine

## 2015-08-23 DIAGNOSIS — M2550 Pain in unspecified joint: Secondary | ICD-10-CM | POA: Diagnosis not present

## 2015-08-23 DIAGNOSIS — M7989 Other specified soft tissue disorders: Secondary | ICD-10-CM | POA: Diagnosis not present

## 2015-08-29 ENCOUNTER — Other Ambulatory Visit: Payer: Self-pay | Admitting: Family Medicine

## 2015-08-31 ENCOUNTER — Telehealth: Payer: Self-pay | Admitting: *Deleted

## 2015-08-31 NOTE — Telephone Encounter (Signed)
Refill request for Zolpidem 10mg  Last filled by MD on - 05/31/15, 330x2 Last AEX - 02.23.17 [04.23.17 acute] Next AEX - 3-Mths. Please Advise on refills/SLS 06/14

## 2015-08-31 NOTE — Telephone Encounter (Signed)
Dr blyth pt  

## 2015-09-01 ENCOUNTER — Other Ambulatory Visit: Payer: Self-pay | Admitting: Family Medicine

## 2015-09-01 NOTE — Telephone Encounter (Signed)
Please refill his zolpidem for #30 with 2 rf

## 2015-09-02 MED ORDER — ZOLPIDEM TARTRATE 10 MG PO TABS: 10.0000 mg | ORAL_TABLET | Freq: Every evening | ORAL | Status: DC | PRN

## 2015-09-02 NOTE — Telephone Encounter (Signed)
Dr Abner GreenspanBlyth has not been in office since Wednesday. I am covering today so I will fill in her absence. Rx printed. Please fax.

## 2015-09-02 NOTE — Telephone Encounter (Signed)
RX request faxed to pharmacy/SLS 06/16

## 2015-09-06 DIAGNOSIS — M7989 Other specified soft tissue disorders: Secondary | ICD-10-CM | POA: Diagnosis not present

## 2015-09-06 DIAGNOSIS — M2550 Pain in unspecified joint: Secondary | ICD-10-CM | POA: Diagnosis not present

## 2015-09-06 DIAGNOSIS — L538 Other specified erythematous conditions: Secondary | ICD-10-CM | POA: Diagnosis not present

## 2015-09-14 DIAGNOSIS — B078 Other viral warts: Secondary | ICD-10-CM | POA: Diagnosis not present

## 2015-09-14 DIAGNOSIS — L281 Prurigo nodularis: Secondary | ICD-10-CM | POA: Diagnosis not present

## 2015-09-14 DIAGNOSIS — L905 Scar conditions and fibrosis of skin: Secondary | ICD-10-CM | POA: Diagnosis not present

## 2015-10-25 ENCOUNTER — Other Ambulatory Visit: Payer: Self-pay | Admitting: Family Medicine

## 2015-11-04 ENCOUNTER — Other Ambulatory Visit: Payer: Self-pay | Admitting: Family Medicine

## 2015-11-09 DIAGNOSIS — B9689 Other specified bacterial agents as the cause of diseases classified elsewhere: Secondary | ICD-10-CM | POA: Diagnosis not present

## 2015-11-09 DIAGNOSIS — L281 Prurigo nodularis: Secondary | ICD-10-CM | POA: Diagnosis not present

## 2015-11-09 DIAGNOSIS — L0232 Furuncle of buttock: Secondary | ICD-10-CM | POA: Diagnosis not present

## 2015-11-19 ENCOUNTER — Other Ambulatory Visit: Payer: Self-pay | Admitting: Family Medicine

## 2015-11-22 ENCOUNTER — Other Ambulatory Visit: Payer: Self-pay | Admitting: Family Medicine

## 2015-11-22 NOTE — Telephone Encounter (Signed)
Faxed hardcopy to [facility]

## 2015-12-05 ENCOUNTER — Other Ambulatory Visit: Payer: Self-pay | Admitting: Family Medicine

## 2015-12-05 ENCOUNTER — Other Ambulatory Visit: Payer: Self-pay | Admitting: Physician Assistant

## 2015-12-05 NOTE — Telephone Encounter (Signed)
[facility] 10 mg Last filled - 09/02/15, #30x2 Last AEX - 05/12/15 [06/29/15 [place]] Next AEX - 3-Mths. Please Advise on refills/SLS 09/18

## 2015-12-06 ENCOUNTER — Telehealth: Payer: Self-pay | Admitting: Family Medicine

## 2015-12-06 ENCOUNTER — Other Ambulatory Visit: Payer: Self-pay | Admitting: Family Medicine

## 2015-12-06 NOTE — Telephone Encounter (Signed)
[facility] [address] [place]

## 2015-12-06 NOTE — Telephone Encounter (Signed)
[facility]([place] stating rx for Zolpidem Remus Loffler(ambien) 10 Mg tablet was faxed but at the pharmacy they did not receive the fax, rx was verified with Zella Ballobin and faxed was resent to pharmacy again. Pharmacist should have no problem since it was just resent.

## 2015-12-16 DIAGNOSIS — Z23 Encounter for immunization: Secondary | ICD-10-CM | POA: Diagnosis not present

## 2015-12-19 ENCOUNTER — Encounter: Payer: Self-pay | Admitting: Family Medicine

## 2015-12-19 ENCOUNTER — Ambulatory Visit (INDEPENDENT_AMBULATORY_CARE_PROVIDER_SITE_OTHER): Payer: BLUE CROSS/BLUE SHIELD | Admitting: Family Medicine

## 2015-12-19 VITALS — BP 148/86 | HR 69 | Temp 98.9°F | Ht 73.0 in | Wt 170.2 lb

## 2015-12-19 DIAGNOSIS — R197 Diarrhea, unspecified: Secondary | ICD-10-CM | POA: Insufficient documentation

## 2015-12-19 DIAGNOSIS — E349 Endocrine disorder, unspecified: Secondary | ICD-10-CM

## 2015-12-19 DIAGNOSIS — E559 Vitamin D deficiency, unspecified: Secondary | ICD-10-CM

## 2015-12-19 DIAGNOSIS — R350 Frequency of micturition: Secondary | ICD-10-CM

## 2015-12-19 DIAGNOSIS — E118 Type 2 diabetes mellitus with unspecified complications: Secondary | ICD-10-CM | POA: Diagnosis not present

## 2015-12-19 DIAGNOSIS — I10 Essential (primary) hypertension: Secondary | ICD-10-CM

## 2015-12-19 DIAGNOSIS — K5900 Constipation, unspecified: Secondary | ICD-10-CM | POA: Diagnosis not present

## 2015-12-19 DIAGNOSIS — N401 Enlarged prostate with lower urinary tract symptoms: Secondary | ICD-10-CM

## 2015-12-19 DIAGNOSIS — Z9884 Bariatric surgery status: Secondary | ICD-10-CM

## 2015-12-19 DIAGNOSIS — E782 Mixed hyperlipidemia: Secondary | ICD-10-CM

## 2015-12-19 HISTORY — DX: Bariatric surgery status: Z98.84

## 2015-12-19 MED ORDER — TAMSULOSIN HCL 0.4 MG PO CAPS: 0.4000 mg | ORAL_CAPSULE | Freq: Every day | ORAL | 5 refills | Status: DC

## 2015-12-19 NOTE — Assessment & Plan Note (Signed)
hgba1c acceptable, minimize simple carbs. Increase exercise as tolerated. Continue current meds 

## 2015-12-19 NOTE — Assessment & Plan Note (Signed)
Heck levels today

## 2015-12-19 NOTE — Assessment & Plan Note (Signed)
Encouraged heart healthy diet, increase exercise, avoid trans fats, consider a krill oil cap daily 

## 2015-12-19 NOTE — Patient Instructions (Signed)

## 2015-12-19 NOTE — Progress Notes (Signed)
Pre visit review using our clinic review tool, if applicable. No additional management support is needed unless otherwise documented below in the visit note. 

## 2015-12-19 NOTE — Assessment & Plan Note (Signed)
Describes numerous episodes of greasy stools daily. Check amylase and lipase today and consider Creon supplements. Minimize fatty foods.

## 2015-12-19 NOTE — Assessment & Plan Note (Signed)
Check labs including minerals today

## 2015-12-19 NOTE — Progress Notes (Signed)
Patient ID: Daniel [name], male   DOB: [birth year], 60 y.o.   MRN: [id number]   Subjective:    Patient ID: Daniel [name], male    DOB: 12-08-1955, 60 y.o.   MRN: [id number]  Chief Complaint  Patient presents with  . Urinary Frequency    HPI Patient is in today for follow up. He is noting urinary frequency without dysuria or hematuria or hesitancy. Just needing to go every few hours at most. No fevers or chills. Is also noting roughly 5-7 loose, greasy stool a day and he has been struggling with this for years. Has been offered Questran but has not tried it yet. He has used Imodium prn but then gets constipated. Denies CP/palp/SOB/HA/congestion. Taking meds as prescribed  Past Medical History:  Diagnosis Date  . Anxiety   . BPH (benign prostatic hyperplasia) 08/22/2011  . COPD (chronic obstructive pulmonary disease) (HCC)   . Depression   . Hemorrhoids 05/22/2015  . Hyperlipidemia   . Hypertension   . Obesity   . Osteoarthritis 01/13/2013  . Osteoarthritis    left knee, treating with injections in [facility]  . Sleep apnea   . 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 IRRIGATION AND DEBRIDEMENT SEBACEOUS CYST     back    Family History  Problem Relation Age of Onset  . Cancer Mother     ovarian  . Hyperlipidemia Father   . Hypertension Father   . Heart disease Father     MI 7/19  . Emphysema Father   . Diabetes Maternal Uncle   . Kidney disease Maternal Uncle   . Diabetes Paternal Uncle   . Cancer Paternal Uncle   . Heart disease Maternal Grandmother   . Heart disease Maternal Grandfather   . Stroke Maternal Grandfather   . Heart disease Paternal Grandmother   . Angina Paternal Grandmother   . Heart disease Paternal Grandfather   . Stroke Paternal Grandfather     Social History   Social History  . Marital status: Single    Spouse name: N/A  . Number of children: N/A  .  Years of education: N/A   Occupational History  . Not on file.   Social History Main Topics  . Smoking status: Current Every Day Smoker    Packs/day: 1.00    Years: 40.00    Types: Cigarettes    Last attempt to quit: 01/08/2013  . Smokeless tobacco: Never Used     Comment: 3-4 cigarettes daily.   Patient started back smoking. smokes one pack per day.  . Alcohol use No     Comment: once a month, glass of wine  . Drug use: No  . Sexual activity: Not on file   Other Topics Concern  . Not on file   Social History Narrative   Regular exercise-no    Outpatient Medications Prior to Visit  Medication Sig Dispense Refill  . Beta Carotene (VITAMIN A) 25000 UNIT capsule Take 1 capsule (25,000 Units total) by mouth daily.    Marland Kitchen buPROPion (WELLBUTRIN XL) 150 MG 24 hr tablet TAKE 1 TABLET THREE TIMES A DAY 90 tablet 3  . BYSTOLIC 5 MG tablet TAKE 1 TABLET BY MOUTH TWICE A DAY 60 tablet 4  . clobetasol (TEMOVATE) 0.05 % external solution USE TO SCALP AS DIRECTED 50 mL 6  . diazepam (VALIUM) 5 MG tablet TAKE 2 TABLETS EVERY 8 HOURS AS NEEDED  FOR ANXIETY 90 tablet 1  . DULoxetine (CYMBALTA) 30 MG capsule TAKE 3 CAPSULES (90 MG TOTAL) BY MOUTH DAILY. 90 capsule 0  . finasteride (PROPECIA) 1 MG tablet Take 1 tablet (1 mg total) by mouth daily. 30 tablet 11  . lisinopril (PRINIVIL,ZESTRIL) 2.5 MG tablet TAKE 1 TABLET (2.5 MG TOTAL) BY MOUTH DAILY. 30 tablet 11  . Multiple Vitamins-Minerals (CENTRUM SILVER) tablet Take 2 tablets by mouth daily.    . sildenafil (VIAGRA) 100 MG tablet Take 0.5-1 tablets (50-100 mg total) by mouth daily as needed for erectile dysfunction. 10 tablet 2  . vitamin E 100 UNIT capsule Take 1 capsule (100 Units total) by mouth daily.    Marland Kitchen zolpidem (AMBIEN) 10 MG tablet TAKE 1 TABLET BY MOUTH AT BEDTIME AS NEEDED 30 tablet 0  . doxycycline (VIBRAMYCIN) 100 MG capsule TAKE 1 CAPSULE (100 MG TOTAL) BY MOUTH 2 (TWO) TIMES DAILY. 20 capsule 2  . meloxicam (MOBIC) 15 MG tablet  Take 1/2 or 1 daily as needed for arthritis pain 30 tablet 3  . Vitamin D, Ergocalciferol, (DRISDOL) 50000 units CAPS capsule Take 1 capsule (50,000 Units total) by mouth every 7 (seven) days. 4 capsule 4   No facility-administered medications prior to visit.     No Known Allergies  Review of Systems  Constitutional: Negative for fever and malaise/fatigue.  HENT: Negative for congestion.   Eyes: Negative for blurred vision.  Respiratory: Negative for shortness of breath.   Cardiovascular: Negative for chest pain, palpitations and leg swelling.  Gastrointestinal: Positive for diarrhea. Negative for abdominal pain, blood in stool, constipation and nausea.  Genitourinary: Positive for frequency. Negative for dysuria and urgency.  Musculoskeletal: Negative for falls.  Skin: Negative for rash.  Neurological: Negative for dizziness, loss of consciousness and headaches.  Endo/Heme/Allergies: Negative for environmental allergies.  Psychiatric/Behavioral: Negative for depression. The patient is not nervous/anxious.        Objective:    Physical Exam  Constitutional: He is oriented to person, place, and time. He appears well-developed and well-nourished. No distress.  HENT:  Head: Normocephalic and atraumatic.  Nose: Nose normal.  Eyes: Right eye exhibits no discharge. Left eye exhibits no discharge.  Neck: Normal range of motion. Neck supple.  Cardiovascular: Normal rate and regular rhythm.   No murmur heard. Pulmonary/Chest: Effort normal and breath sounds normal.  Abdominal: Soft. Bowel sounds are normal. There is no tenderness.  Musculoskeletal: He exhibits no edema.  Neurological: He is alert and oriented to person, place, and time.  Skin: Skin is warm and dry.  Psychiatric: He has a normal mood and affect.  Nursing note and vitals reviewed.   BP (!) 148/86 (BP Location: Left Arm, Patient Position: Sitting, Cuff Size: Normal)   Pulse 69   Temp 98.9 F (37.2 C) (Oral)   Ht  6\' 1"  (1.854 m)   Wt 170 lb 4 oz (77.2 kg)   SpO2 97%   BMI 22.46 kg/m  Wt Readings from Last 3 Encounters:  12/19/15 170 lb 4 oz (77.2 kg)  06/29/15 159 lb 12.8 oz (72.5 kg)  05/12/15 164 lb 8 oz (74.6 kg)     Lab Results  Component Value Date   WBC 6.4 05/12/2015   HGB 14.4 05/12/2015   HCT 42.7 05/12/2015   PLT 228.0 05/12/2015   GLUCOSE 98 05/12/2015   CHOL 142 05/12/2015   TRIG 58.0 05/12/2015   HDL 42.60 05/12/2015   LDLDIRECT 116.5 08/10/2011   LDLCALC 87 05/12/2015  ALT 49 05/12/2015   AST 40 (H) 05/12/2015   NA 141 05/12/2015   K 3.7 05/12/2015   CL 106 05/12/2015   CREATININE 0.71 05/12/2015   BUN 10 05/12/2015   CO2 32 05/12/2015   TSH 2.04 05/12/2015   PSA 0.63 09/06/2010   HGBA1C 5.2 05/12/2015   MICROALBUR 2.0 (H) 10/28/2014    Lab Results  Component Value Date   TSH 2.04 05/12/2015   Lab Results  Component Value Date   WBC 6.4 05/12/2015   HGB 14.4 05/12/2015   HCT 42.7 05/12/2015   MCV 93.2 05/12/2015   PLT 228.0 05/12/2015   Lab Results  Component Value Date   NA 141 05/12/2015   K 3.7 05/12/2015   CO2 32 05/12/2015   GLUCOSE 98 05/12/2015   BUN 10 05/12/2015   CREATININE 0.71 05/12/2015   BILITOT 0.4 05/12/2015   ALKPHOS 114 05/12/2015   AST 40 (H) 05/12/2015   ALT 49 05/12/2015   PROT 6.2 05/12/2015   ALBUMIN 4.0 05/12/2015   CALCIUM 8.7 05/12/2015   GFR 120.67 05/12/2015   Lab Results  Component Value Date   CHOL 142 05/12/2015   Lab Results  Component Value Date   HDL 42.60 05/12/2015   Lab Results  Component Value Date   LDLCALC 87 05/12/2015   Lab Results  Component Value Date   TRIG 58.0 05/12/2015   Lab Results  Component Value Date   CHOLHDL 3 05/12/2015   Lab Results  Component Value Date   HGBA1C 5.2 05/12/2015       Assessment & Plan:   Problem List Items Addressed This Visit    Hypotestosteronemia   Relevant Orders   Testosterone   Hyperlipidemia    Encouraged heart healthy diet,  increase exercise, avoid trans fats, consider a krill oil cap daily      Essential hypertension   Constipation   DM (diabetes mellitus) (HCC)    hgba1c acceptable, minimize simple carbs. Increase exercise as tolerated. Continue current meds      Vitamin D deficiency    Heck levels today      Relevant Orders   VITAMIN D 25 Hydroxy (Vit-D Deficiency, Fractures)   BPH (benign prostatic hyperplasia)    Check urinalysis today, refer to urology and started on Flomax      Relevant Medications   tamsulosin (FLOMAX) 0.4 MG CAPS capsule   Status post biliopancreatic diversion with duodenal switch    Check labs including minerals today      Relevant Orders   Magnesium   Ceruloplasmin   Zinc   Iron   IBC panel   Diarrhea    Describes numerous episodes of greasy stools daily. Check amylase and lipase today and consider Creon supplements. Minimize fatty foods.       Relevant Orders   Amylase   Lipase    Other Visit Diagnoses    Urinary frequency    -  Primary   Relevant Medications   tamsulosin (FLOMAX) 0.4 MG CAPS capsule   Other Relevant Orders   Comprehensive metabolic panel   Urinalysis   Urine culture   Ambulatory referral to Urology      I have discontinued Mr. Boleyn Vitamin D (Ergocalciferol), meloxicam, and doxycycline. I am also having him start on tamsulosin. Additionally, I am having him maintain his clobetasol, lisinopril, CENTRUM SILVER, vitamin A, vitamin E, sildenafil, finasteride, buPROPion, BYSTOLIC, diazepam, DULoxetine, and zolpidem.  Meds ordered this encounter  Medications  . tamsulosin (FLOMAX) 0.4 MG  CAPS capsule    Sig: Take 1 capsule (0.4 mg total) by mouth daily.    Dispense:  30 capsule    Refill:  5     Danise Edge, MD

## 2015-12-19 NOTE — Assessment & Plan Note (Signed)
Check urinalysis today, refer to urology and started on Flomax

## 2015-12-20 ENCOUNTER — Other Ambulatory Visit (INDEPENDENT_AMBULATORY_CARE_PROVIDER_SITE_OTHER): Payer: BLUE CROSS/BLUE SHIELD

## 2015-12-20 DIAGNOSIS — E785 Hyperlipidemia, unspecified: Secondary | ICD-10-CM

## 2015-12-20 LAB — URINE CULTURE: Organism ID, Bacteria: NO GROWTH

## 2015-12-20 LAB — COMPREHENSIVE METABOLIC PANEL
ALBUMIN: 3.9 g/dL (ref 3.5–5.2)
ALK PHOS: 116 U/L (ref 39–117)
ALT: 42 U/L (ref 0–53)
AST: 39 U/L — AB (ref 0–37)
BUN: 11 mg/dL (ref 6–23)
CO2: 33 mEq/L — ABNORMAL HIGH (ref 19–32)
CREATININE: 0.89 mg/dL (ref 0.40–1.50)
Calcium: 8.8 mg/dL (ref 8.4–10.5)
Chloride: 106 mEq/L (ref 96–112)
GFR: 92.78 mL/min (ref >60.00)
GLUCOSE: 65 mg/dL — AB (ref 70–99)
POTASSIUM: 3.8 meq/L (ref 3.5–5.1)
SODIUM: 143 meq/L (ref 135–145)
TOTAL PROTEIN: 6.6 g/dL (ref 6.0–8.3)
Total Bilirubin: 0.5 mg/dL (ref 0.2–1.2)

## 2015-12-20 LAB — IRON: IRON: 107 ug/dL (ref 42–165)

## 2015-12-20 LAB — URINALYSIS
BILIRUBIN URINE: NEGATIVE
Hgb urine dipstick: NEGATIVE
Ketones, ur: NEGATIVE
Leukocytes, UA: NEGATIVE
NITRITE: NEGATIVE
PH: 6 (ref 5.0–8.0)
Specific Gravity, Urine: >=1.03 — AB (ref 1.000–1.030)
TOTAL PROTEIN, URINE-UPE24: NEGATIVE
URINE GLUCOSE: NEGATIVE
Urobilinogen, UA: 0.2 (ref 0.0–1.0)

## 2015-12-20 LAB — IBC PANEL
IRON: 107 ug/dL (ref 42–165)
Saturation Ratios: 24.1 % (ref 20.0–50.0)
Transferrin: 317 mg/dL (ref 212.0–360.0)

## 2015-12-20 LAB — LIPID PANEL
Cholesterol: 181 mg/dL (ref 0–200)
HDL: 54.6 mg/dL (ref >39.00)
LDL Cholesterol: 115 mg/dL — ABNORMAL HIGH (ref 0–99)
NONHDL: 126.7
Total CHOL/HDL Ratio: 3
Triglycerides: 58 mg/dL (ref 0.0–149.0)
VLDL: 11.6 mg/dL (ref 0.0–40.0)

## 2015-12-20 LAB — VITAMIN D 25 HYDROXY (VIT D DEFICIENCY, FRACTURES): VITD: 15.72 ng/mL — AB (ref 30.00–100.00)

## 2015-12-20 LAB — LIPASE: Lipase: 16 U/L (ref 11.0–59.0)

## 2015-12-20 LAB — TESTOSTERONE: TESTOSTERONE: 300.18 ng/dL (ref 300.00–890.00)

## 2015-12-20 LAB — MAGNESIUM: MAGNESIUM: 2 mg/dL (ref 1.5–2.5)

## 2015-12-20 LAB — AMYLASE: Amylase: 34 U/L (ref 27–131)

## 2015-12-21 ENCOUNTER — Other Ambulatory Visit: Payer: Self-pay

## 2015-12-21 DIAGNOSIS — E559 Vitamin D deficiency, unspecified: Secondary | ICD-10-CM

## 2015-12-21 LAB — ZINC: Zinc: 58 ug/dL — ABNORMAL LOW (ref 60–130)

## 2015-12-21 LAB — CERULOPLASMIN: CERULOPLASMIN: 19 mg/dL (ref 18–36)

## 2015-12-21 MED ORDER — VITAMIN D (ERGOCALCIFEROL) 1.25 MG (50000 UNIT) PO CAPS: 50000.0000 [IU] | ORAL_CAPSULE | ORAL | 4 refills | Status: DC

## 2015-12-29 ENCOUNTER — Ambulatory Visit (INDEPENDENT_AMBULATORY_CARE_PROVIDER_SITE_OTHER)
Admission: RE | Admit: 2015-12-29 | Discharge: 2015-12-29 | Disposition: A | Discharge status: Home / Self Care | Payer: BLUE CROSS/BLUE SHIELD | Source: Ambulatory Visit | Attending: Acute Care | Admitting: Acute Care

## 2015-12-29 DIAGNOSIS — F17210 Nicotine dependence, cigarettes, uncomplicated: Secondary | ICD-10-CM

## 2015-12-29 DIAGNOSIS — Z87891 Personal history of nicotine dependence: Secondary | ICD-10-CM | POA: Diagnosis not present

## 2015-12-30 ENCOUNTER — Telehealth: Payer: Self-pay | Admitting: Acute Care

## 2015-12-30 DIAGNOSIS — F17210 Nicotine dependence, cigarettes, uncomplicated: Secondary | ICD-10-CM

## 2015-12-30 NOTE — Telephone Encounter (Signed)
I have called [name] the results of his low-dose CT screening scan. His scan was read as a Lung RADS 2: nodules that are benign in appearance and behavior with a very low likelihood of becoming a clinically active cancer due to size or lack of growth. Recommendation per radiology is for a repeat LDCT in 12 months. I explained to him that we would order and schedule his repeat annual scan in October 2018. I also discussed with him that the scan indicated emphysema and imaging findings  of suggestive of underlying COPD. He stated that he felt sure that that would be the case. We did discuss that if he felt that this was limiting him in any way to let Dr. Kevin [name] no so that she can prescribe inhalers or alternately a referral to pulmonary medicine. [name] verbalized understanding of the above and had no further questions at completion of the call. He has my contact information in the event he has questions in the future.

## 2016-01-06 ENCOUNTER — Telehealth: Payer: Self-pay | Admitting: Family Medicine

## 2016-01-06 NOTE — Telephone Encounter (Signed)
Zolpidem Rx printed remotely; Rx phoned to pharmacy/SLS 10/20

## 2016-01-06 NOTE — Telephone Encounter (Signed)
perfect

## 2016-01-16 ENCOUNTER — Other Ambulatory Visit: Payer: Self-pay | Admitting: Family Medicine

## 2016-01-18 ENCOUNTER — Encounter: Payer: Self-pay | Admitting: Internal Medicine

## 2016-01-18 ENCOUNTER — Ambulatory Visit (INDEPENDENT_AMBULATORY_CARE_PROVIDER_SITE_OTHER): Payer: BLUE CROSS/BLUE SHIELD | Admitting: Internal Medicine

## 2016-01-18 VITALS — BP 118/74 | HR 74 | Temp 98.3°F | Resp 14 | Ht 73.0 in | Wt 169.5 lb

## 2016-01-18 DIAGNOSIS — R51 Headache: Secondary | ICD-10-CM | POA: Diagnosis not present

## 2016-01-18 DIAGNOSIS — R519 Headache, unspecified: Secondary | ICD-10-CM

## 2016-01-18 LAB — CBC WITH DIFFERENTIAL/PLATELET
BASOS PCT: 0.5 % (ref 0.0–3.0)
Basophils Absolute: 0 10*3/uL (ref 0.0–0.1)
EOS ABS: 0.4 10*3/uL (ref 0.0–0.7)
Eosinophils Relative: 5.5 % — ABNORMAL HIGH (ref 0.0–5.0)
HCT: 45.6 % (ref 39.0–52.0)
HEMOGLOBIN: 15.5 g/dL (ref 13.0–17.0)
LYMPHS ABS: 1.8 10*3/uL (ref 0.7–4.0)
Lymphocytes Relative: 28 % (ref 12.0–46.0)
MCHC: 34 g/dL (ref 30.0–36.0)
MCV: 92.6 fl (ref 78.0–100.0)
MONO ABS: 0.5 10*3/uL (ref 0.1–1.0)
Monocytes Relative: 8.4 % (ref 3.0–12.0)
NEUTROS ABS: 3.7 10*3/uL (ref 1.4–7.7)
NEUTROS PCT: 57.6 % (ref 43.0–77.0)
PLATELETS: 232 10*3/uL (ref 150.0–400.0)
RBC: 4.92 Mil/uL (ref 4.22–5.81)
RDW: 13.8 % (ref 11.5–15.5)
WBC: 6.4 10*3/uL (ref 4.0–10.5)

## 2016-01-18 LAB — SEDIMENTATION RATE: SED RATE: 3 mm/h (ref 0–20)

## 2016-01-18 NOTE — Patient Instructions (Signed)
GO TO THE LAB : Get the blood work     We are scheduling a CT of the head   If you have severe or persistent headaches:   call the office  If you have a mild headache okay to take ibuprofen or aspirin

## 2016-01-18 NOTE — Progress Notes (Signed)
Subjective:    Patient ID: Daniel [name], male    DOB: May 19, 1955, 60 y.o.   MRN: [id number]  DOS:  01/18/2016 Type of visit - description : Acute visit Interval history: Symptoms started 4 days ago, he is having headaches, pain  "sharp, fierce", go from one temple to the other, triggered by getting up and down from his bed, when the HA come, last 30 seconds, not associated with other symptoms specifically no tearing or runny nose.Marland Kitchen He never had headaches, by definition this is the worse headache of his life. Today for the first time in 4 days he  did not have headaches. He is quite concerned about his symptoms.   Review of Systems  Denies fever, chills. No sinus congestion. No weight loss or jaw claudication.  No nausea vomiting No diplopia, motor deficits of partial numbness. No dizziness. No head injury, no neck pain No taking any new medications No rash. No visual disturbances  Past Medical History:  Diagnosis Date  . Anxiety   . BPH (benign prostatic hyperplasia) 08/22/2011  . COPD (chronic obstructive pulmonary disease) (HCC)   . Depression   . Hemorrhoids 05/22/2015  . Hyperlipidemia   . Hypertension   . Obesity   . Osteoarthritis 01/13/2013  . Osteoarthritis    left knee, treating with injections in [facility]  . Sleep apnea   . 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 IRRIGATION AND DEBRIDEMENT SEBACEOUS CYST     back    Social History   Social History  . Marital status: Single    Spouse name: N/A  . Number of children: N/A  . Years of education: N/A   Occupational History  . Not on file.   Social History Main Topics  . Smoking status: Current Every Day Smoker    Packs/day: 1.00    Years: 40.00    Types: Cigarettes    Last attempt to quit: 01/08/2013  . Smokeless tobacco: Never Used     Comment: 3-4 cigarettes daily.   Patient started back smoking. smokes one  pack per day.  . Alcohol use No     Comment: once a month, glass of wine  . Drug use: No  . Sexual activity: Not on file   Other Topics Concern  . Not on file   Social History Narrative   Regular exercise-no        Medication List       Accurate as of 01/18/16  5:09 PM. Always use your most recent med list.          buPROPion 150 MG 24 hr tablet Commonly known as:  WELLBUTRIN XL TAKE 1 TABLET THREE TIMES A DAY   BYSTOLIC 5 MG tablet Generic drug:  nebivolol TAKE 1 TABLET BY MOUTH TWICE A DAY   CENTRUM SILVER tablet Take 2 tablets by mouth daily.   clobetasol 0.05 % external solution Commonly known as:  TEMOVATE USE TO SCALP AS DIRECTED   diazepam 5 MG tablet Commonly known as:  VALIUM TAKE 2 TABLETS EVERY 8 HOURS AS NEEDED FOR ANXIETY   DULoxetine 30 MG capsule Commonly known as:  CYMBALTA TAKE 3 CAPSULES (90 MG TOTAL) BY MOUTH DAILY.   finasteride 1 MG tablet Commonly known as:  PROPECIA Take 1 tablet (1 mg total) by mouth daily.   lisinopril 2.5 MG tablet Commonly known as:  PRINIVIL,ZESTRIL TAKE 1 TABLET (2.5 MG TOTAL) BY  MOUTH DAILY.   sildenafil 100 MG tablet Commonly known as:  VIAGRA Take 0.5-1 tablets (50-100 mg total) by mouth daily as needed for erectile dysfunction.   tamsulosin 0.4 MG Caps capsule Commonly known as:  FLOMAX Take 1 capsule (0.4 mg total) by mouth daily.   vitamin A 52841 UNIT capsule Take 1 capsule (25,000 Units total) by mouth daily.   Vitamin D (Ergocalciferol) 50000 units Caps capsule Commonly known as:  DRISDOL Take 1 capsule (50,000 Units total) by mouth once a week.   vitamin E 100 UNIT capsule Take 1 capsule (100 Units total) by mouth daily.   zolpidem 10 MG tablet Commonly known as:  AMBIEN TAKE 1 TABLET BY MOUTH AT BEDTIME AS NEEDED          Objective:   Physical Exam BP 118/74 (BP Location: Left Arm, Patient Position: Sitting, Cuff Size: Normal)   Pulse 74   Temp 98.3 F (36.8 C) (Oral)   Resp  14   Ht 6\' 1"  (1.854 m)   Wt 169 lb 8 oz (76.9 kg)   BMI 22.36 kg/m  General:   Well developed, well nourished . NAD.  HEENT:  Normocephalic . Face symmetric, atraumatic. Temples no TTP EOMI. Neck: Normal carotid pulses Lungs:  CTA B Normal respiratory effort, no intercostal retractions, no accessory muscle use. Heart: RRR,  no murmur.  No pretibial edema bilaterally  Skin: Not pale. Not jaundice Neurologic:  alert & oriented X3.  Speech normal, gait appropriate for age and unassisted DTRs: Absent knee jerks otherwise symmetric.  motor exam symmetric. Psych--  Cognition and judgment appear intact.  Cooperative with normal attention span and concentration.  Behavior appropriate. No anxious or depressed appearing.      Assessment & Plan:   60 year old gentleman with history of hyperglycemia, COPD, anxiety, depression, HTN, hyperlipidemia, obesity, DJD, sleep apnea, BPH, h/o  low testosterone, on multiple medications, presents with: New onset of headache as described above, neurological exam normal, etiology unclear, No red flag symptoms except that this is a new sx. Plan: Aspirin-nsaids as needed, sedimentation rate, CBC, CT head. ER or call if symptoms resurface or they are persistent. Patient in agreement.

## 2016-01-18 NOTE — Progress Notes (Signed)
Pre visit review using our clinic review tool, if applicable. No additional management support is needed unless otherwise documented below in the visit note. 

## 2016-01-19 ENCOUNTER — Ambulatory Visit (HOSPITAL_BASED_OUTPATIENT_CLINIC_OR_DEPARTMENT_OTHER): Payer: BLUE CROSS/BLUE SHIELD

## 2016-01-23 DIAGNOSIS — L538 Other specified erythematous conditions: Secondary | ICD-10-CM | POA: Diagnosis not present

## 2016-01-23 DIAGNOSIS — M2550 Pain in unspecified joint: Secondary | ICD-10-CM | POA: Diagnosis not present

## 2016-01-23 DIAGNOSIS — M7989 Other specified soft tissue disorders: Secondary | ICD-10-CM | POA: Diagnosis not present

## 2016-02-02 ENCOUNTER — Other Ambulatory Visit: Payer: Self-pay | Admitting: Family Medicine

## 2016-02-06 ENCOUNTER — Telehealth: Payer: Self-pay | Admitting: Family Medicine

## 2016-02-06 ENCOUNTER — Other Ambulatory Visit: Payer: Self-pay | Admitting: Family Medicine

## 2016-02-06 MED ORDER — FUROSEMIDE 20 MG PO TABS: ORAL_TABLET | ORAL | 1 refills | Status: DC

## 2016-02-06 NOTE — Telephone Encounter (Signed)
Lasix sent in as pcp instructed and patient requested.

## 2016-02-06 NOTE — Telephone Encounter (Signed)
Relation to QM:VHQIpt:self Call back number:[phone] Pharmacy: [id number] - [place], [place] - [address] [phone] (Phone) [phone] (Fax)     Reason for call:  Patient requesting a refill lasix (chart doesn't reflect current medication) patient states its due to swollen feet please advise

## 2016-02-06 NOTE — Telephone Encounter (Signed)
[facility]Scribe [facility] from [facility] for [facility] on Zolpidem 10mg  Last filled - 01/06/16, #30x2 Last AEX - 12/19/15 Next AEX - 4-Mths  Called and spoke with pharmacy; verified that patient does have [2] refills available on 01/06/16 px, renewal request Denied/SLS 11/20

## 2016-02-06 NOTE — Telephone Encounter (Signed)
OK to restart last rx for furosemide, #30 with 1 rf

## 2016-02-07 DIAGNOSIS — R6882 Decreased libido: Secondary | ICD-10-CM | POA: Diagnosis not present

## 2016-02-07 DIAGNOSIS — R351 Nocturia: Secondary | ICD-10-CM | POA: Diagnosis not present

## 2016-02-13 ENCOUNTER — Other Ambulatory Visit: Payer: Self-pay | Admitting: Family Medicine

## 2016-02-13 NOTE — Telephone Encounter (Signed)
Requesting:  diazepam Contract   10/28/2014 UDS  No UDS Last OV   12/19/2015 Last Refill   #90 with 1 refill on 11/22/2015  Please Advise

## 2016-02-14 NOTE — Telephone Encounter (Signed)
Faxed hardcopy for [facility] to [facility] [address] [place]

## 2016-02-17 ENCOUNTER — Other Ambulatory Visit: Payer: Self-pay | Admitting: Family Medicine

## 2016-02-17 NOTE — Telephone Encounter (Signed)
Refill sent per [facility] refill protocol/SLS  

## 2016-02-18 ENCOUNTER — Other Ambulatory Visit: Payer: Self-pay | Admitting: Family Medicine

## 2016-02-20 NOTE — Telephone Encounter (Signed)
Faxed hardcopy for [facility] to [facility] [address] [place] [address] [place]

## 2016-03-16 ENCOUNTER — Other Ambulatory Visit: Payer: Self-pay | Admitting: Family Medicine

## 2016-04-10 ENCOUNTER — Other Ambulatory Visit: Payer: Self-pay | Admitting: Family Medicine

## 2016-04-21 ENCOUNTER — Other Ambulatory Visit: Payer: Self-pay | Admitting: Family Medicine

## 2016-04-23 ENCOUNTER — Telehealth: Payer: Self-pay | Admitting: Family Medicine

## 2016-04-23 MED ORDER — NEBIVOLOL HCL 5 MG PO TABS: 5.0000 mg | ORAL_TABLET | Freq: Two times a day (BID) | ORAL | 4 refills | Status: DC

## 2016-04-23 NOTE — Telephone Encounter (Signed)
Sent in as requested And patient informed refill done

## 2016-04-23 NOTE — Telephone Encounter (Signed)
Caller name: Relationship to patient: Self Can be reached: [phone]  Pharmacy:  [id number] - [place], [place] - [address] [phone] (Phone) [phone] (Fax)     Reason for call: Refill on BYSTOLIC 5 MG tablet

## 2016-04-27 ENCOUNTER — Encounter: Payer: Self-pay | Admitting: Family Medicine

## 2016-04-27 ENCOUNTER — Ambulatory Visit (INDEPENDENT_AMBULATORY_CARE_PROVIDER_SITE_OTHER): Payer: BLUE CROSS/BLUE SHIELD | Admitting: Family Medicine

## 2016-04-27 VITALS — BP 116/62 | HR 65 | Temp 98.3°F | Ht 73.0 in | Wt 177.6 lb

## 2016-04-27 DIAGNOSIS — R350 Frequency of micturition: Secondary | ICD-10-CM

## 2016-04-27 DIAGNOSIS — Z0000 Encounter for general adult medical examination without abnormal findings: Secondary | ICD-10-CM

## 2016-04-27 DIAGNOSIS — E785 Hyperlipidemia, unspecified: Secondary | ICD-10-CM

## 2016-04-27 DIAGNOSIS — E118 Type 2 diabetes mellitus with unspecified complications: Secondary | ICD-10-CM | POA: Diagnosis not present

## 2016-04-27 DIAGNOSIS — F17200 Nicotine dependence, unspecified, uncomplicated: Secondary | ICD-10-CM | POA: Diagnosis not present

## 2016-04-27 DIAGNOSIS — E349 Endocrine disorder, unspecified: Secondary | ICD-10-CM

## 2016-04-27 DIAGNOSIS — E559 Vitamin D deficiency, unspecified: Secondary | ICD-10-CM | POA: Diagnosis not present

## 2016-04-27 DIAGNOSIS — N401 Enlarged prostate with lower urinary tract symptoms: Secondary | ICD-10-CM | POA: Diagnosis not present

## 2016-04-27 DIAGNOSIS — I10 Essential (primary) hypertension: Secondary | ICD-10-CM

## 2016-04-27 DIAGNOSIS — R601 Generalized edema: Secondary | ICD-10-CM

## 2016-04-27 DIAGNOSIS — Z9889 Other specified postprocedural states: Secondary | ICD-10-CM

## 2016-04-27 DIAGNOSIS — E60 Dietary zinc deficiency: Secondary | ICD-10-CM

## 2016-04-27 DIAGNOSIS — Z9884 Bariatric surgery status: Secondary | ICD-10-CM

## 2016-04-27 DIAGNOSIS — R609 Edema, unspecified: Secondary | ICD-10-CM | POA: Insufficient documentation

## 2016-04-27 HISTORY — DX: Dietary zinc deficiency: E60

## 2016-04-27 LAB — LIPID PANEL
CHOL/HDL RATIO: 4
Cholesterol: 169 mg/dL (ref 0–200)
HDL: 45.5 mg/dL (ref >39.00)
LDL Cholesterol: 108 mg/dL — ABNORMAL HIGH (ref 0–99)
NonHDL: 123.1
TRIGLYCERIDES: 74 mg/dL (ref 0.0–149.0)
VLDL: 14.8 mg/dL (ref 0.0–40.0)

## 2016-04-27 LAB — CBC WITH DIFFERENTIAL/PLATELET
BASOS PCT: 0.6 % (ref 0.0–3.0)
Basophils Absolute: 0 10*3/uL (ref 0.0–0.1)
EOS PCT: 5.9 % — AB (ref 0.0–5.0)
Eosinophils Absolute: 0.4 10*3/uL (ref 0.0–0.7)
HEMATOCRIT: 43.3 % (ref 39.0–52.0)
Hemoglobin: 14.7 g/dL (ref 13.0–17.0)
LYMPHS ABS: 2 10*3/uL (ref 0.7–4.0)
LYMPHS PCT: 28.3 % (ref 12.0–46.0)
MCHC: 34 g/dL (ref 30.0–36.0)
MCV: 94.5 fl (ref 78.0–100.0)
MONOS PCT: 7.9 % (ref 3.0–12.0)
Monocytes Absolute: 0.6 10*3/uL (ref 0.1–1.0)
NEUTROS ABS: 4.1 10*3/uL (ref 1.4–7.7)
Neutrophils Relative %: 57.3 % (ref 43.0–77.0)
PLATELETS: 261 10*3/uL (ref 150.0–400.0)
RBC: 4.58 Mil/uL (ref 4.22–5.81)
RDW: 13.1 % (ref 11.5–15.5)
WBC: 7.2 10*3/uL (ref 4.0–10.5)

## 2016-04-27 LAB — COMPREHENSIVE METABOLIC PANEL
ALT: 32 U/L (ref 0–53)
AST: 27 U/L (ref 0–37)
Albumin: 4.1 g/dL (ref 3.5–5.2)
Alkaline Phosphatase: 110 U/L (ref 39–117)
BILIRUBIN TOTAL: 0.5 mg/dL (ref 0.2–1.2)
BUN: 10 mg/dL (ref 6–23)
CALCIUM: 8.9 mg/dL (ref 8.4–10.5)
CO2: 31 meq/L (ref 19–32)
Chloride: 107 mEq/L (ref 96–112)
Creatinine, Ser: 0.77 mg/dL (ref 0.40–1.50)
GFR: 109.53 mL/min (ref >60.00)
Glucose, Bld: 88 mg/dL (ref 70–99)
POTASSIUM: 3.9 meq/L (ref 3.5–5.1)
Sodium: 141 mEq/L (ref 135–145)
Total Protein: 6.2 g/dL (ref 6.0–8.3)

## 2016-04-27 LAB — TSH: TSH: 2.5 u[IU]/mL (ref 0.35–4.50)

## 2016-04-27 LAB — VITAMIN D 25 HYDROXY (VIT D DEFICIENCY, FRACTURES): VITD: 7.62 ng/mL — ABNORMAL LOW (ref 30.00–100.00)

## 2016-04-27 LAB — HEMOGLOBIN A1C: Hgb A1c MFr Bld: 5.5 % (ref 4.6–6.5)

## 2016-04-27 LAB — PSA: PSA: 0.66 ng/mL (ref 0.10–4.00)

## 2016-04-27 LAB — MAGNESIUM: MAGNESIUM: 1.9 mg/dL (ref 1.5–2.5)

## 2016-04-27 MED ORDER — DULOXETINE HCL 30 MG PO CPEP: ORAL_CAPSULE | ORAL | 0 refills | Status: DC

## 2016-04-27 MED ORDER — DULOXETINE HCL 30 MG PO CPEP: ORAL_CAPSULE | ORAL | 5 refills | Status: DC

## 2016-04-27 NOTE — Progress Notes (Signed)
Pre visit review using our clinic review tool, if applicable. No additional management support is needed unless otherwise documented below in the visit note. 

## 2016-04-27 NOTE — Assessment & Plan Note (Addendum)
Encouraged complete cessation. Discussed need to quit as relates to risk of numerous cancers, cardiac and pulmonary disease as well as neurologic complications. Counseled for greater than 3 minutes. Patient not interested in quitting at this time. 

## 2016-04-27 NOTE — Assessment & Plan Note (Signed)
Check levels today 

## 2016-04-27 NOTE — Patient Instructions (Addendum)
Spoke with you about Advanced Directives. Advised Patient to bring a copy of the [place],  Living Will and DNR form into [facility] to be scanned into your Chart. Preventive Care 40-64 Years, Male Preventive care refers to lifestyle choices and visits with your health care provider that can promote health and wellness. What does preventive care include?  A yearly physical exam. This is also called an annual well check.  Dental exams once or twice a year.  Routine eye exams. Ask your health care provider how often you should have your eyes checked.  Personal lifestyle choices, including:  Daily care of your teeth and gums.  Regular physical activity.  Eating a healthy diet.  Avoiding tobacco and drug use.  Limiting alcohol use.  Practicing safe sex.  Taking low-dose aspirin every day starting at age 4. What happens during an annual well check? The services and screenings done by your health care provider during your annual well check will depend on your age, overall health, lifestyle risk factors, and family history of disease. Counseling  Your health care provider may ask you questions about your:  Alcohol use.  Tobacco use.  Drug use.  Emotional well-being.  Home and relationship well-being.  Sexual activity.  Eating habits.  Work and work Statistician. Screening  You may have the following tests or measurements:  Height, weight, and BMI.  Blood pressure.  Lipid and cholesterol levels. These may be checked every 5 years, or more frequently if you are over 70 years old.  Skin check.  Lung cancer screening. You may have this screening every year starting at age 61 if you have a 30-pack-year history of smoking and currently smoke or have quit within the past 15 years.  Fecal occult blood test (FOBT) of the stool. You may have this test every year starting at age 29.  Flexible sigmoidoscopy or colonoscopy. You may have a  sigmoidoscopy every 5 years or a colonoscopy every 10 years starting at age 70.  Prostate cancer screening. Recommendations will vary depending on your family history and other risks.  Hepatitis C blood test.  Hepatitis B blood test.  Sexually transmitted disease (STD) testing.  Diabetes screening. This is done by checking your blood sugar (glucose) after you have not eaten for a while (fasting). You may have this done every 1-3 years. Discuss your test results, treatment options, and if necessary, the need for more tests with your health care provider. Vaccines  Your health care provider may recommend certain vaccines, such as:  Influenza vaccine. This is recommended every year.  Tetanus, diphtheria, and acellular pertussis (Tdap, Td) vaccine. You may need a Td booster every 10 years.  Varicella vaccine. You may need this if you have not been vaccinated.  Zoster vaccine. You may need this after age 74.  Measles, mumps, and rubella (MMR) vaccine. You may need at least one dose of MMR if you were born in 1957 or later. You may also need a second dose.  Pneumococcal 13-valent conjugate (PCV13) vaccine. You may need this if you have certain conditions and have not been vaccinated.  Pneumococcal polysaccharide (PPSV23) vaccine. You may need one or two doses if you smoke cigarettes or if you have certain conditions.  Meningococcal vaccine. You may need this if you have certain conditions.  Hepatitis A vaccine. You may need this if you have certain conditions or if you travel or work in places where you may be exposed to hepatitis A.  Hepatitis B vaccine. You may need this if you have certain conditions or if you travel or work in places where you may be exposed to hepatitis B.  Haemophilus influenzae type b (Hib) vaccine. You may need this if you have certain risk factors. Talk to your health care provider about which screenings and vaccines you need and how often you need them. This  information is not intended to replace advice given to you by your health care provider. Make sure you discuss any questions you have with your health care provider. Document Released: 04/01/2015 Document Revised: 11/23/2015 Document Reviewed: 01/04/2015 Elsevier Interactive Patient Education  2017 [employer].

## 2016-04-27 NOTE — Assessment & Plan Note (Signed)
Has trouble a couple times a week and takes 2 lasix tabs and it resolves, his mother struggles with the same concerns. Encouraged to manage the sodium as well

## 2016-04-27 NOTE — Assessment & Plan Note (Signed)
Encouraged heart healthy diet, increase exercise, avoid trans fats, consider a krill oil cap daily 

## 2016-04-27 NOTE — Assessment & Plan Note (Signed)
Patient encouraged to maintain heart healthy diet, regular exercise, adequate sleep. Consider daily probiotics. Take medications as prescribed 

## 2016-04-27 NOTE — Assessment & Plan Note (Signed)
Check psa today 

## 2016-04-27 NOTE — Assessment & Plan Note (Signed)
hgba1c acceptable, minimize simple carbs. Increase exercise as tolerated.  

## 2016-04-27 NOTE — Assessment & Plan Note (Signed)
Well controlled, no changes to meds. Encouraged heart healthy diet such as the DASH diet and exercise as tolerated.  °

## 2016-04-27 NOTE — Progress Notes (Signed)
Patient ID: Daniel [name], male   DOB: [birth year], 61 y.o.   MRN: [id number]   Subjective:    Patient ID: Daniel [name], male    DOB: [birth year], 61 y.o.   MRN: [id number]  Chief Complaint  Patient presents with  . Annual Exam  . Diabetes  . Hyperlipidemia    Diabetes  He presents for his follow-up diabetic visit. He has type 2 diabetes mellitus. His disease course has been stable. Pertinent negatives for hypoglycemia include no dizziness, headaches or nervousness/anxiousness. Pertinent negatives for diabetes include no chest pain and no weakness. There is no change in his home blood glucose trend.  Hyperlipidemia  This is a chronic problem. The current episode started more than 1 month ago. Pertinent negatives include no chest pain, myalgias or shortness of breath. The current treatment provides mild improvement of lipids.    Patient is in today for an annual examination. Patient has an Hx of hyperlipidemia, diabetes. Patient has no acute concerns at this time. He underwent a duodenal switch several years ago and done a good job of keeping his weight off since then. He notes some occasional trouble with swelling in his hands and feet. When he takes a Lasix these symptoms resolve. No pattern of dietary changes seems to precede these episodes. Denies CP/palp/SOB/HA/congestion/fevers/GI or GU c/o. Taking meds as prescribed  I acted as a Neurosurgeon for Danise Edge, MD. [place], [place]   Past Medical History:  Diagnosis Date  . Anxiety   . BPH (benign prostatic hyperplasia) 08/22/2011  . COPD (chronic obstructive pulmonary disease) (HCC)   . Depression   . H/O gastric bypass 04/27/2016   Duodenal switch at [facility] Jul 30, 2013  . Hemorrhoids 05/22/2015  . Hyperlipidemia   . Hypertension   . Obesity   . Osteoarthritis 01/13/2013  . Osteoarthritis    left knee, treating with injections in [facility]  . Peripheral edema 04/27/2016  . Sleep apnea   . 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 Zinc deficiency 04/27/2016    Past Surgical History:  Procedure Laterality Date  . IRRIGATION AND DEBRIDEMENT SEBACEOUS CYST     back    Family History  Problem Relation Age of Onset  . Cancer Mother     ovarian  . Hyperlipidemia Father   . Hypertension Father   . Heart disease Father     MI 7/19  . Emphysema Father   . Pulmonary disease Father   . Heart disease Maternal Grandmother   . Heart disease Maternal Grandfather   . Stroke Maternal Grandfather   . Heart disease Paternal Grandmother   . Angina Paternal Grandmother   . Heart disease Paternal Grandfather   . Stroke Paternal Grandfather   . Diabetes Maternal Uncle   . Kidney disease Maternal Uncle   . Diabetes Paternal Uncle   . Cancer Paternal Uncle     Social History   Social History  . Marital status: Single    Spouse name: N/A  . Number of children: N/A  . Years of education: N/A   Occupational History  . Not on file.   Social History Main Topics  . Smoking status: Current Every Day Smoker    Packs/day: 1.00    Years: 40.00    Types: Cigarettes    Last attempt to quit: 01/08/2013  . Smokeless tobacco: Never Used     Comment: 3-4 cigarettes daily.   Patient started back smoking. smokes one pack per day.  Marland Kitchen  Alcohol use No     Comment: once a month, glass of wine  . Drug use: No  . Sexual activity: Not on file   Other Topics Concern  . Not on file   Social History Narrative   Regular exercise-no    Outpatient Medications Prior to Visit  Medication Sig Dispense Refill  . Beta Carotene (VITAMIN A) 25000 UNIT capsule Take 1 capsule (25,000 Units total) by mouth daily.    Marland Kitchen buPROPion (WELLBUTRIN XL) 150 MG 24 hr tablet TAKE 1 TABLET THREE TIMES A DAY 90 tablet 3  . clobetasol (TEMOVATE) 0.05 % external solution USE TO SCALP AS DIRECTED 50 mL 6  . diazepam (VALIUM) 5 MG tablet TAKE 2 TABLETS EVERY 8 HOURS AS NEEDED FOR ANXIETY 90 tablet 1  . finasteride (PROPECIA) 1 MG  tablet Take 1 tablet (1 mg total) by mouth daily. 30 tablet 11  . furosemide (LASIX) 20 MG tablet TAKE ONE TABLET BY MOUTH ONCE A DAY AS NEEDED FOR SWELLING 30 tablet 1  . lisinopril (PRINIVIL,ZESTRIL) 2.5 MG tablet TAKE 1 TABLET (2.5 MG TOTAL) BY MOUTH DAILY. 30 tablet 11  . Multiple Vitamins-Minerals (CENTRUM SILVER) tablet Take 2 tablets by mouth daily.    . nebivolol (BYSTOLIC) 5 MG tablet Take 1 tablet (5 mg total) by mouth 2 (two) times daily. 60 tablet 4  . sildenafil (VIAGRA) 100 MG tablet Take 0.5-1 tablets (50-100 mg total) by mouth daily as needed for erectile dysfunction. 10 tablet 2  . tamsulosin (FLOMAX) 0.4 MG CAPS capsule Take 1 capsule (0.4 mg total) by mouth daily. 30 capsule 5  . Vitamin D, Ergocalciferol, (DRISDOL) 50000 units CAPS capsule Take 1 capsule (50,000 Units total) by mouth once a week. 4 capsule 4  . vitamin E 100 UNIT capsule Take 1 capsule (100 Units total) by mouth daily.    Marland Kitchen zolpidem (AMBIEN) 10 MG tablet TAKE 1 TABLET BY MOUTH AT BEDTIME AS NEEDED 30 tablet 2  . DULoxetine (CYMBALTA) 30 MG capsule TAKE 3 CAPSULES (90 MG TOTAL) BY MOUTH DAILY. 90 capsule 0   No facility-administered medications prior to visit.     No Known Allergies  Review of Systems  Constitutional: Negative for chills, fever and malaise/fatigue.  HENT: Negative for congestion and hearing loss.   Eyes: Negative for discharge.  Respiratory: Negative for cough, sputum production and shortness of breath.   Cardiovascular: Negative for chest pain, palpitations and leg swelling.  Gastrointestinal: Negative for abdominal pain, blood in stool, constipation, diarrhea, heartburn, nausea and vomiting.  Genitourinary: Negative for dysuria, frequency, hematuria and urgency.  Musculoskeletal: Negative for back pain, falls and myalgias.  Skin: Negative for rash.  Neurological: Negative for dizziness, sensory change, loss of consciousness, weakness and headaches.  Endo/Heme/Allergies: Negative for  environmental allergies. Does not bruise/bleed easily.  Psychiatric/Behavioral: Negative for depression and suicidal ideas. The patient is not nervous/anxious and does not have insomnia.        Objective:    Physical Exam  Constitutional: He is oriented to person, place, and time. He appears well-developed and well-nourished. No distress.  HENT:  Head: Normocephalic and atraumatic.  Eyes: Conjunctivae are normal.  Neck: Normal range of motion. No thyromegaly present.  Cardiovascular: Normal rate and regular rhythm.   Pulmonary/Chest: Effort normal and breath sounds normal. He has no wheezes.  Abdominal: Soft. Bowel sounds are normal. There is no tenderness.  Musculoskeletal: He exhibits no edema or deformity.  Neurological: He is alert and oriented to person, place, and  time.  Skin: Skin is warm and dry. He is not diaphoretic.  Psychiatric: He has a normal mood and affect.    BP 116/62 (BP Location: Left Arm, Patient Position: Sitting, Cuff Size: Normal)   Pulse 65   Temp 98.3 F (36.8 C) (Oral)   Ht 6\' 1"  (1.854 m)   Wt 177 lb 9.6 oz (80.6 kg)   SpO2 97% Comment: RA  BMI 23.43 kg/m  Wt Readings from Last 3 Encounters:  04/27/16 177 lb 9.6 oz (80.6 kg)  01/18/16 169 lb 8 oz (76.9 kg)  12/19/15 170 lb 4 oz (77.2 kg)     Lab Results  Component Value Date   WBC 6.4 01/18/2016   HGB 15.5 01/18/2016   HCT 45.6 01/18/2016   PLT 232.0 01/18/2016   GLUCOSE 65 (L) 12/19/2015   CHOL 181 12/20/2015   TRIG 58.0 12/20/2015   HDL 54.60 12/20/2015   LDLDIRECT 116.5 08/10/2011   LDLCALC 115 (H) 12/20/2015   ALT 42 12/19/2015   AST 39 (H) 12/19/2015   NA 143 12/19/2015   K 3.8 12/19/2015   CL 106 12/19/2015   CREATININE 0.89 12/19/2015   BUN 11 12/19/2015   CO2 33 (H) 12/19/2015   TSH 2.04 05/12/2015   PSA 0.63 09/06/2010   HGBA1C 5.2 05/12/2015   MICROALBUR 2.0 (H) 10/28/2014    Lab Results  Component Value Date   TSH 2.04 05/12/2015   Lab Results  Component Value  Date   WBC 6.4 01/18/2016   HGB 15.5 01/18/2016   HCT 45.6 01/18/2016   MCV 92.6 01/18/2016   PLT 232.0 01/18/2016   Lab Results  Component Value Date   NA 143 12/19/2015   K 3.8 12/19/2015   CO2 33 (H) 12/19/2015   GLUCOSE 65 (L) 12/19/2015   BUN 11 12/19/2015   CREATININE 0.89 12/19/2015   BILITOT 0.5 12/19/2015   ALKPHOS 116 12/19/2015   AST 39 (H) 12/19/2015   ALT 42 12/19/2015   PROT 6.6 12/19/2015   ALBUMIN 3.9 12/19/2015   CALCIUM 8.8 12/19/2015   GFR 92.78 12/19/2015   Lab Results  Component Value Date   CHOL 181 12/20/2015   Lab Results  Component Value Date   HDL 54.60 12/20/2015   Lab Results  Component Value Date   LDLCALC 115 (H) 12/20/2015   Lab Results  Component Value Date   TRIG 58.0 12/20/2015   Lab Results  Component Value Date   CHOLHDL 3 12/20/2015   Lab Results  Component Value Date   HGBA1C 5.2 05/12/2015       Assessment & Plan:   Problem List Items Addressed This Visit    Hypotestosteronemia    Check testosterone today      Hyperlipidemia    Encouraged heart healthy diet, increase exercise, avoid trans fats, consider a krill oil cap daily      Relevant Orders   Lipid panel   TOBACCO ABUSE    Encouraged complete cessation. Discussed need to quit as relates to risk of numerous cancers, cardiac and pulmonary disease as well as neurologic complications. Counseled for greater than 3 minutes. Patient not interested in quitting at this time      Essential hypertension    Well controlled, no changes to meds. Encouraged heart healthy diet such as the DASH diet and exercise as tolerated.       Relevant Orders   CBC with Differential/Platelet   Comprehensive metabolic panel   PERIPHERAL EDEMA    Has trouble  a couple times a week and takes 2 lasix tabs and it resolves, his mother struggles with the same concerns. Encouraged to manage the sodium as well      Annual physical exam    Patient encouraged to maintain heart healthy  diet, regular exercise, adequate sleep. Consider daily probiotics. Take medications as prescribed      DM (diabetes mellitus) (HCC)    hgba1c acceptable, minimize simple carbs. Increase exercise as tolerated.       Relevant Orders   Hemoglobin A1c   Vitamin D deficiency    Check levels today      Relevant Orders   VITAMIN D 25 Hydroxy (Vit-D Deficiency, Fractures)   BPH (benign prostatic hyperplasia)    Check psa today      Relevant Orders   PSA   Zinc deficiency    Recheck levels, continue supplements      Relevant Orders   Zinc   H/O gastric bypass    Will check Copper, magnesium, iron levels, and more per surgery recommendations      Relevant Orders   Ceruloplasmin   Magnesium   Copper, Free    Other Visit Diagnoses    Preventative health care    -  Primary   Relevant Medications   DULoxetine (CYMBALTA) 30 MG capsule   Other Relevant Orders   CBC with Differential/Platelet   Comprehensive metabolic panel   Lipid panel   TSH   VITAMIN D 25 Hydroxy (Vit-D Deficiency, Fractures)   Zinc   PSA   Ceruloplasmin   Magnesium   Copper, Free      I am having Mr. Frisbie maintain his clobetasol, lisinopril, CENTRUM SILVER, vitamin A, vitamin E, sildenafil, finasteride, tamsulosin, Vitamin D (Ergocalciferol), zolpidem, buPROPion, diazepam, furosemide, nebivolol, and DULoxetine.  Meds ordered this encounter  Medications  . DISCONTD: DULoxetine (CYMBALTA) 30 MG capsule    Sig: TAKE 3 CAPSULES (90 MG TOTAL) BY MOUTH DAILY.    Dispense:  90 capsule    Refill:  0  . DULoxetine (CYMBALTA) 30 MG capsule    Sig: TAKE 3 CAPSULES (90 MG TOTAL) BY MOUTH DAILY.    Dispense:  90 capsule    Refill:  5    CMA served as scribe during this visit. History, Physical and Plan performed by medical provider. Documentation and orders reviewed and attested to.  Danise Edge, MD

## 2016-04-27 NOTE — Assessment & Plan Note (Signed)
-  Check testosterone today.  

## 2016-04-27 NOTE — Assessment & Plan Note (Signed)
Will check Copper, magnesium, iron levels, and more per surgery recommendations

## 2016-04-27 NOTE — Assessment & Plan Note (Signed)
Recheck levels, continue supplements

## 2016-04-30 ENCOUNTER — Other Ambulatory Visit: Payer: Self-pay | Admitting: Family Medicine

## 2016-04-30 DIAGNOSIS — E559 Vitamin D deficiency, unspecified: Secondary | ICD-10-CM

## 2016-04-30 LAB — CERULOPLASMIN: CERULOPLASMIN: 23 mg/dL (ref 18–36)

## 2016-04-30 MED ORDER — VITAMIN D (ERGOCALCIFEROL) 1.25 MG (50000 UNIT) PO CAPS: 50000.0000 [IU] | ORAL_CAPSULE | ORAL | 4 refills | Status: DC

## 2016-05-01 LAB — ZINC: Zinc: 51 ug/dL — ABNORMAL LOW (ref 60–130)

## 2016-05-08 LAB — COPPER, FREE: COPPER - FREE, SERUM/PLASMA: 530 ug/L

## 2016-05-14 ENCOUNTER — Other Ambulatory Visit: Payer: Self-pay | Admitting: Family Medicine

## 2016-05-15 ENCOUNTER — Other Ambulatory Visit: Payer: Self-pay | Admitting: Family Medicine

## 2016-05-15 NOTE — Telephone Encounter (Signed)
Notify needs UDS but can have refills

## 2016-05-15 NOTE — Telephone Encounter (Signed)
Last refill #30 with 2 refills on 01/06/2016 Last office visit 04/27/2016 Contract signed on 10/28/2014/no UDS

## 2016-05-15 NOTE — Telephone Encounter (Signed)
Patient informed to pickup hardcopy for zolpide/do UDS. He verbalized understanding.

## 2016-05-17 ENCOUNTER — Encounter: Payer: Self-pay | Admitting: Family Medicine

## 2016-05-17 DIAGNOSIS — Z79899 Other long term (current) drug therapy: Secondary | ICD-10-CM | POA: Diagnosis not present

## 2016-06-13 ENCOUNTER — Other Ambulatory Visit: Payer: Self-pay | Admitting: Family Medicine

## 2016-06-13 NOTE — Telephone Encounter (Signed)
Ok to refill with RF. Need to update contract

## 2016-06-13 NOTE — Telephone Encounter (Signed)
Requesting:    diazepam Contract      10/28/2014 UDS    Low risk next is due on 11/14/2016 Last OV     2.9.2018 Last Refill          #90 with 1 refill on 02/20/2016  Please Advise

## 2016-06-14 ENCOUNTER — Encounter: Payer: Self-pay | Admitting: Family Medicine

## 2016-06-14 NOTE — Telephone Encounter (Signed)
Called the patient informed to pickup hardcopy/update contract. He verbalized understanding.

## 2016-06-21 ENCOUNTER — Ambulatory Visit (INDEPENDENT_AMBULATORY_CARE_PROVIDER_SITE_OTHER): Payer: BLUE CROSS/BLUE SHIELD | Admitting: Family Medicine

## 2016-06-21 ENCOUNTER — Encounter: Payer: Self-pay | Admitting: Family Medicine

## 2016-06-21 ENCOUNTER — Encounter: Payer: Self-pay | Admitting: Gastroenterology

## 2016-06-21 VITALS — BP 108/62 | HR 65 | Temp 98.2°F | Ht 73.0 in | Wt 178.1 lb

## 2016-06-21 DIAGNOSIS — K623 Rectal prolapse: Secondary | ICD-10-CM

## 2016-06-21 DIAGNOSIS — K921 Melena: Secondary | ICD-10-CM | POA: Diagnosis not present

## 2016-06-21 NOTE — Patient Instructions (Signed)
If you do not hear anything about your referral in the next 1-2 weeks, call our office and ask for an update.  Bring up the prolapse issue with your surgeon.

## 2016-06-21 NOTE — Progress Notes (Signed)
Pre visit review using our clinic review tool, if applicable. No additional management support is needed unless otherwise documented below in the visit note. 

## 2016-06-21 NOTE — Progress Notes (Signed)
Chief Complaint  Patient presents with  . Rectal Pain    bleeding  . Abdominal Pain    Subjective: Patient is a 61 y.o. male here for rectal bleeding.  Pt has had BRBPR for around 1 year, worsening over past week. After this started, he did give himself an enema and felt immediate pain. Had some worsening bleeding after this. Blood in stool and toilet tissue. Some accompanying LLQ abd pain to deep touch and some left sided distension. He has been having more gas as well. Denies fevers, medication changes, recent travel, N/V, nighttime awakenings or weight loss. He will have alternating loose stool and nml stools. Additionally, the patient notices that a mass protrudes from his anal opening whenever he has a bowel movement. He is able to push it back in manually.This has been going on for around 1 week also. No injury.  ROS: GI: As noted in HPI Const: No fevers  Family History  Problem Relation Age of Onset  . Cancer Mother     ovarian  . Hyperlipidemia Father   . Hypertension Father   . Heart disease Father     MI 7/19  . Emphysema Father   . Pulmonary disease Father   . Heart disease Maternal Grandmother   . Heart disease Maternal Grandfather   . Stroke Maternal Grandfather   . Heart disease Paternal Grandmother   . Angina Paternal Grandmother   . Heart disease Paternal Grandfather   . Stroke Paternal Grandfather   . Diabetes Maternal Uncle   . Kidney disease Maternal Uncle   . Diabetes Paternal Uncle   . Cancer Paternal Uncle    Past Medical History:  Diagnosis Date  . Anxiety   . BPH (benign prostatic hyperplasia) 08/22/2011  . COPD (chronic obstructive pulmonary disease) (HCC)   . Depression   . H/O gastric bypass 04/27/2016   Duodenal switch at [facility] Jul 30, 2013  . Hemorrhoids 05/22/2015  . Hyperlipidemia   . Hypertension   . Obesity   . Osteoarthritis 01/13/2013  . Osteoarthritis    left knee, treating with injections in [facility]  . Peripheral edema  04/27/2016  . Sleep apnea   . 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 Zinc deficiency 04/27/2016   No Known Allergies  Current Outpatient Prescriptions:  .  Beta Carotene (VITAMIN A) 25000 UNIT capsule, Take 1 capsule (25,000 Units total) by mouth daily., Disp: , Rfl:  .  buPROPion (WELLBUTRIN XL) 150 MG 24 hr tablet, TAKE 1 TABLET THREE TIMES A DAY, Disp: 90 tablet, Rfl: 3 .  clobetasol (TEMOVATE) 0.05 % external solution, USE TO SCALP AS DIRECTED, Disp: 50 mL, Rfl: 6 .  diazepam (VALIUM) 5 MG tablet, TAKE 2 TABLETS EVERY 8 HOURS AS NEEDED FOR ANXIETY, Disp: 90 tablet, Rfl: 1 .  DULoxetine (CYMBALTA) 30 MG capsule, TAKE 3 CAPSULES (90 MG TOTAL) BY MOUTH DAILY., Disp: 90 capsule, Rfl: 5 .  finasteride (PROPECIA) 1 MG tablet, Take 1 tablet (1 mg total) by mouth daily., Disp: 30 tablet, Rfl: 11 .  furosemide (LASIX) 20 MG tablet, TAKE ONE TABLET BY MOUTH ONCE A DAY AS NEEDED FOR SWELLING, Disp: 30 tablet, Rfl: 1 .  lisinopril (PRINIVIL,ZESTRIL) 2.5 MG tablet, TAKE 1 TABLET (2.5 MG TOTAL) BY MOUTH DAILY., Disp: 30 tablet, Rfl: 5 .  Multiple Vitamins-Minerals (CENTRUM SILVER) tablet, Take 2 tablets by mouth daily., Disp: , Rfl:  .  nebivolol (BYSTOLIC) 5 MG tablet, Take 1 tablet (5 mg total) by  mouth 2 (two) times daily., Disp: 60 tablet, Rfl: 4 .  sildenafil (VIAGRA) 100 MG tablet, Take 0.5-1 tablets (50-100 mg total) by mouth daily as needed for erectile dysfunction., Disp: 10 tablet, Rfl: 2 .  tamsulosin (FLOMAX) 0.4 MG CAPS capsule, Take 1 capsule (0.4 mg total) by mouth daily., Disp: 30 capsule, Rfl: 5 .  Vitamin D, Ergocalciferol, (DRISDOL) 50000 units CAPS capsule, Take 1 capsule (50,000 Units total) by mouth 2 (two) times a week., Disp: 8 capsule, Rfl: 4 .  vitamin E 100 UNIT capsule, Take 1 capsule (100 Units total) by mouth daily., Disp: , Rfl:  .  zolpidem (AMBIEN) 10 MG tablet, TAKE 1 TABLET AT BEDTIME AS NEEDED, Disp: 30 tablet, Rfl: 2  Objective: BP  108/62 (BP Location: Left Arm, Patient Position: Sitting, Cuff Size: Normal)   Pulse 65   Temp 98.2 F (36.8 C) (Oral)   Ht  (1.854 m)   Wt 178 lb 2 oz (80.8 kg)   SpO2 98%   BMI 23.50 kg/m  General: Awake, appears stated age HEENT: MMM, EOMi  Heart: RRR, no murmurs Lungs: CTAB, no rales, wheezes or rhonchi. No accessory muscle use Abd: BS hyperactive, soft, tender to deep palpation in LLQ, L sided distension appreciated, gas appreciated with palpation, no masses or organomegaly Rectal: minimal prolapse noted without valsalva, sphincter tone weaker than expected, evidence of external hemorrhoids appreciated, no fissuring or internal hemorrhoids appreciated, no rectal masses palpated Psych: Age appropriate judgment and insight, normal affect and mood  Assessment and Plan: Hematochezia - Plan: Ambulatory referral to Gastroenterology  Rectal prolapse  Orders as above. Pt interested in colonoscopy or at least having a better look. He is very concerned about colon cancer. Last colonoscopy was in 2013 and was normal. He follows up with his surgeon shortly and is to bring up rectal prolapse issue with provider. If he needs a referral, OK to call office and I will place it.  F/u prn with reg PCP. The patient voiced understanding and agreement to the plan.  Jilda Roche Kimball, DO 06/21/16  2:19 PM

## 2016-06-29 ENCOUNTER — Other Ambulatory Visit: Payer: BLUE CROSS/BLUE SHIELD

## 2016-06-29 ENCOUNTER — Ambulatory Visit (INDEPENDENT_AMBULATORY_CARE_PROVIDER_SITE_OTHER): Payer: BLUE CROSS/BLUE SHIELD | Admitting: Gastroenterology

## 2016-06-29 ENCOUNTER — Encounter: Payer: Self-pay | Admitting: Gastroenterology

## 2016-06-29 VITALS — BP 110/70 | HR 78 | Ht 73.0 in | Wt 184.0 lb

## 2016-06-29 DIAGNOSIS — R140 Abdominal distension (gaseous): Secondary | ICD-10-CM

## 2016-06-29 DIAGNOSIS — R1032 Left lower quadrant pain: Secondary | ICD-10-CM | POA: Diagnosis not present

## 2016-06-29 DIAGNOSIS — K625 Hemorrhage of anus and rectum: Secondary | ICD-10-CM | POA: Diagnosis not present

## 2016-06-29 LAB — BUN/CREATININE RATIO
BUN / CREAT RATIO: 9.4 ratio (ref 6–22)
BUN: 8 mg/dL (ref 7–25)
CREATININE: 0.85 mg/dL (ref 0.70–1.25)

## 2016-06-29 MED ORDER — NA SULFATE-K SULFATE-MG SULF 17.5-3.13-1.6 GM/177ML PO SOLN: ORAL | 0 refills | Status: DC

## 2016-06-29 NOTE — Progress Notes (Signed)
06/29/2016 Daniel [name] [id number] Apr 15, 1955   HISTORY OF PRESENT ILLNESS:  This is a 61 year old male who is new to our practice. He has been referred here by Dr. Carmelia Roller for evaluation regarding rectal bleeding, rectal discomfort, abdominal discomfort, and bloating. The patient tells me that he had a colonoscopy about 5 years ago at [facility]. He does have not have any idea who performed the study. From what he can recall he may have had a couple of polyps removed, but is really unsure. He had a duodenal switch procedure performed at [facility] in 2015. Underwent an EGD there around that time as well. 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 Magnus Ivan at [facility] surgery last summer. There were plans for potential surgery at some point but he has not proceeded with that yet. He actually has an appointment again with Dr. Magnus Ivan on Monday. He tells me that the area of concern does become very swollen and abscessed at times.  Anyway, he tells me that since his duodenal switch surgery he's had some diarrhea. Recently over the past 2 or 3 months, however, he has been having rectal bleeding and rectal pain with bowel movements. Sometimes has difficulty producing his bowel movement. He tells me that he is sure he has some rectal prolapse and has had to reduce that himself. Complains of abdominal pain mostly on the left side. He says that he becomes very swollen on the left side and is clearly asymmetric from the right. Tells me he can feel something hard on the left side at times.   Past Medical History:  Diagnosis Date  . Anxiety   . BPH (benign prostatic hyperplasia) 08/22/2011  . Colon polyps   . COPD (chronic obstructive pulmonary disease) (HCC)   . Depression   . Diabetes (HCC)   . H/O gastric bypass 04/27/2016   Duodenal switch at [facility] Jul 30, 2013  . Hemorrhoids 05/22/2015  . Hyperlipidemia   . Hypertension   . Obesity   .  Osteoarthritis 01/13/2013  . Osteoarthritis    left knee, treating with injections in [facility]  . Peripheral edema 04/27/2016  . Sleep apnea    Gone per pt  . 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 Zinc deficiency 04/27/2016   Past Surgical History:  Procedure Laterality Date  . BARIATRIC SURGERY     duendial switch done at [facility]  . collapsed lung  1983, 1988, 1992  . IRRIGATION AND DEBRIDEMENT SEBACEOUS CYST     back    reports that he has been smoking Cigarettes.  He has a 40.00 pack-year smoking history. He has never used smokeless tobacco. He reports that he does not drink alcohol or use drugs. family history includes Angina in his paternal grandmother; Cancer in his mother and paternal uncle; Diabetes in his maternal uncle and paternal uncle; Emphysema in his father; Heart disease in his father, maternal grandfather, maternal grandmother, paternal grandfather, and paternal grandmother; Hyperlipidemia in his father; Hypertension in his father; Kidney disease in his maternal uncle; Pulmonary disease in his father; Stroke in his maternal grandfather and paternal grandfather. No Known Allergies    Outpatient Encounter Prescriptions as of 06/29/2016  Medication Sig  . Beta Carotene (VITAMIN A) 25000 UNIT capsule Take 1 capsule (25,000 Units total) by mouth daily.  Marland Kitchen buPROPion (WELLBUTRIN XL) 150 MG 24 hr tablet TAKE 1 TABLET THREE TIMES A DAY  .  clobetasol (TEMOVATE) 0.05 % external solution USE TO SCALP AS DIRECTED  . diazepam (VALIUM) 5 MG tablet TAKE 2 TABLETS EVERY 8 HOURS AS NEEDED FOR ANXIETY  . DULoxetine (CYMBALTA) 30 MG capsule TAKE 3 CAPSULES (90 MG TOTAL) BY MOUTH DAILY.  . finasteride (PROPECIA) 1 MG tablet Take 1 tablet (1 mg total) by mouth daily.  . furosemide (LASIX) 20 MG tablet TAKE ONE TABLET BY MOUTH ONCE A DAY AS NEEDED FOR SWELLING  . lisinopril (PRINIVIL,ZESTRIL) 2.5 MG tablet TAKE 1 TABLET (2.5 MG TOTAL) BY MOUTH DAILY.  .  Multiple Vitamins-Minerals (CENTRUM SILVER) tablet Take 2 tablets by mouth daily.  . nebivolol (BYSTOLIC) 5 MG tablet Take 1 tablet (5 mg total) by mouth 2 (two) times daily.  . sildenafil (VIAGRA) 100 MG tablet Take 0.5-1 tablets (50-100 mg total) by mouth daily as needed for erectile dysfunction.  . tamsulosin (FLOMAX) 0.4 MG CAPS capsule Take 1 capsule (0.4 mg total) by mouth daily.  . Vitamin D, Ergocalciferol, (DRISDOL) 50000 units CAPS capsule Take 1 capsule (50,000 Units total) by mouth 2 (two) times a week.  . vitamin E 100 UNIT capsule Take 1 capsule (100 Units total) by mouth daily.  Marland Kitchen zolpidem (AMBIEN) 10 MG tablet TAKE 1 TABLET AT BEDTIME AS NEEDED   No facility-administered encounter medications on file as of 06/29/2016.      REVIEW OF SYSTEMS  : All other systems reviewed and negative except where noted in the History of Present Illness.   PHYSICAL EXAM: BP 110/70   Pulse 78   Ht 6\' 1"  (1.854 m)   Wt 184 lb (83.5 kg)   BMI 24.28 kg/m  General: Well developed white male in no acute distress Head: Normocephalic and atraumatic Eyes:  Sclerae anicteric, conjunctiva pink. Ears: Normal auditory acuity Lungs: Clear throughout to auscultation; no increased WOB. Heart: Regular rate and rhythm Abdomen: Soft, non-distended. Normal bowel sounds.  Some tenderness on the left with some fullness. Rectal:  A lot of extra perianal tissue seen on exam.  No definite prolapse seen on valsalva today.  He also has a very hard, indurated area on the left, which is where he says that he's had the abscess in the past.  No masses felt on DRE.  Decreased sphincter tone. Musculoskeletal: Symmetrical with no gross deformities  Skin: No lesions on visible extremities Extremities: No edema  Neurological: Alert oriented x 4, grossly non-focal Psychological:  Alert and cooperative. Normal mood and affect  ASSESSMENT AND PLAN: -61 year old male with diagnosis of an "anal fistula" that apparently  develops into a perianal abscess at times. Also possible rectal prolapse. Complains of rectal bleeding, rectal discomfort, and occasional difficulty with passing stool. All of the symptoms are likely related to the above-mentioned issues. He has appointment next week with [facility]. We will schedule him for a repeat colonoscopy with Dr. Adela Lank. -Left sided abdominal pain:  Some tenderness on exam and fullness in that area.  Abdominal bloating as well.  I am also going to schedule him for a CT scan of the abdomen and pelvis with contrast.  *The risks, benefits, and alternatives to colonoscopy were discussed with the patient and he consents to proceed.   CC:  Sharlene Dory*

## 2016-06-29 NOTE — Patient Instructions (Addendum)
You have been scheduled for a CT scan of the abdomen and pelvis at [facility] ([address]---this is in the same building as [facility]).   You are scheduled on 07/05/2016 at 215. You should arrive 15 minutes prior to your appointment time for registration. Please follow the written instructions below on the day of your exam:  WARNING: IF YOU ARE ALLERGIC TO IODINE/X-RAY DYE, PLEASE NOTIFY RADIOLOGY IMMEDIATELY AT [phone]! YOU WILL BE GIVEN A 13 HOUR PREMEDICATION PREP.  1) Do not eat or drink anything after 10am (4 hours prior to your test) 2) You have been given 2 bottles of oral contrast to drink. The solution may taste better if refrigerated, but do NOT add ice or any other liquid to this solution. Shake well before drinking.    Drink 1 bottle of contrast @ 1230 (2 hours prior to your exam)  Drink 1 bottle of contrast @ 130 (1 hour prior to your exam)  You may take any medications as prescribed with a small amount of water except for the following: Metformin, Glucophage, Glucovance, Avandamet, Riomet, Fortamet, Actoplus Met, Janumet, Glumetza or Metaglip. The above medications must be held the day of the exam AND 48 hours after the exam.  The purpose of you drinking the oral contrast is to aid in the visualization of your intestinal tract. The contrast solution may cause some diarrhea. Before your exam is started, you will be given a small amount of fluid to drink. Depending on your individual set of symptoms, you may also receive an intravenous injection of x-ray contrast/dye. Plan on being at [facility] for 30 minutes or longer, depending on the type of exam you are having performed.  This test typically takes 30-45 minutes to complete.  If you have any questions regarding your exam or if you need to reschedule, you may call the CT department at [phone] between the hours of 8:00 am and 5:00 pm,  Monday-Friday.  ________________________________________________________________________  Your physician has requested that you go to the basement for the following lab work before leaving today: BUN/Creatinine  You have been scheduled for a colonoscopy. Please follow written instructions given to you at your visit today.  Please pick up your prep supplies at the pharmacy within the next 1-3 days. If you use inhalers (even only as needed), please bring them with you on the day of your procedure.  We have sent the following medications to your pharmacy for you to pick up at your convenience: Suprep  Normal BMI (Body Mass Index- based on height and weight) is between 19 and 25. Your BMI today is Body mass index is 24.28 kg/m. . Please consider follow up  regarding your BMI with your Primary Care Provider.  Thank you 

## 2016-07-02 ENCOUNTER — Other Ambulatory Visit: Payer: Self-pay | Admitting: Surgery

## 2016-07-02 DIAGNOSIS — K603 Anal fistula: Secondary | ICD-10-CM | POA: Diagnosis not present

## 2016-07-02 DIAGNOSIS — K644 Residual hemorrhoidal skin tags: Secondary | ICD-10-CM | POA: Diagnosis not present

## 2016-07-02 NOTE — Progress Notes (Signed)
Agree with assessment and plan as outlined. We can performed colonoscopy to further evaluate, and will await surgical evaluation - unclear if they will perform EUA, although will await cross sectional imaging with CT pelvis will evaluate extent of fistulas

## 2016-07-05 ENCOUNTER — Ambulatory Visit (INDEPENDENT_AMBULATORY_CARE_PROVIDER_SITE_OTHER)
Admission: RE | Admit: 2016-07-05 | Discharge: 2016-07-05 | Disposition: A | Discharge status: Home / Self Care | Payer: BLUE CROSS/BLUE SHIELD | Source: Ambulatory Visit | Attending: Gastroenterology | Admitting: Gastroenterology

## 2016-07-05 DIAGNOSIS — K625 Hemorrhage of anus and rectum: Secondary | ICD-10-CM

## 2016-07-05 DIAGNOSIS — R197 Diarrhea, unspecified: Secondary | ICD-10-CM | POA: Diagnosis not present

## 2016-07-05 DIAGNOSIS — R140 Abdominal distension (gaseous): Secondary | ICD-10-CM

## 2016-07-05 DIAGNOSIS — R1032 Left lower quadrant pain: Secondary | ICD-10-CM

## 2016-07-05 MED ORDER — IOPAMIDOL (ISOVUE-300) INJECTION 61%
100.0000 mL | Freq: Once | INTRAVENOUS | Status: AC | PRN
  Administered 2016-07-05: 100 mL via INTRAVENOUS

## 2016-07-19 ENCOUNTER — Encounter: Payer: Self-pay | Admitting: Gastroenterology

## 2016-07-19 ENCOUNTER — Other Ambulatory Visit: Payer: Self-pay | Admitting: Family Medicine

## 2016-07-24 ENCOUNTER — Other Ambulatory Visit: Payer: Self-pay | Admitting: Family Medicine

## 2016-07-24 DIAGNOSIS — L649 Androgenic alopecia, unspecified: Secondary | ICD-10-CM

## 2016-07-26 ENCOUNTER — Other Ambulatory Visit: Payer: Self-pay

## 2016-07-26 ENCOUNTER — Telehealth: Payer: Self-pay

## 2016-07-26 NOTE — Telephone Encounter (Signed)
Pt sent fax stating that pharmacy contacted him to let him no his [employer] will not cover Suprep. Rebate card [facility]ed to [facility] and instructed to run for pt and to contact pt when ready.

## 2016-07-27 NOTE — Pre-Procedure Instructions (Signed)
    Gerrit FriendsRobert M Madlock  07/27/2016      [id number] [place] [address] [facility] [address] [place] [id number] Phone: [phone] Fax: [phone]  [id number] [place] - [address] [place] [address] Phone: [phone] Fax: [phone]    Your procedure is scheduled on 08/07/16.  [facility]  Call this number if you have problems the morning of surgery:  [phone]   Remember:  Do not eat food or drink liquids after midnight.  Take these medicines the morning of surgery with A SIP OF WATER --valium,cymbalta,bystolic,flomax   Do not wear jewelry, make-up or nail polish.  Do not wear lotions, powders, or perfumes, or deoderant.  Do not shave 48 hours prior to surgery.  Men may shave face and neck.  Do not bring valuables to the hospital.  [facility] is not responsible for any belongings or valuables.  Contacts, dentures or bridgework may not be worn into surgery.  Leave your suitcase in the car.  After surgery it may be brought to your room.  For patients admitted to the hospital, discharge time will be determined by your treatment team.  Patients discharged the day of surgery will not be allowed to drive home.   Name and phone number of your driver:    Special instructions:  Do not take any aspirin,anti-inflammatories,vitamins,or herbal supplements 5-7 days prior to surgery.  Please read over the following fact sheets that you were given.

## 2016-07-30 ENCOUNTER — Encounter (HOSPITAL_COMMUNITY): Payer: Self-pay

## 2016-07-30 ENCOUNTER — Encounter (HOSPITAL_COMMUNITY)
Admission: RE | Admit: 2016-07-30 | Discharge: 2016-07-30 | Disposition: A | Discharge status: Home / Self Care | Payer: BLUE CROSS/BLUE SHIELD | Source: Ambulatory Visit | Attending: Surgery | Admitting: Surgery

## 2016-07-30 DIAGNOSIS — I10 Essential (primary) hypertension: Secondary | ICD-10-CM | POA: Diagnosis not present

## 2016-07-30 DIAGNOSIS — K648 Other hemorrhoids: Secondary | ICD-10-CM | POA: Diagnosis not present

## 2016-07-30 DIAGNOSIS — Z01812 Encounter for preprocedural laboratory examination: Secondary | ICD-10-CM | POA: Insufficient documentation

## 2016-07-30 DIAGNOSIS — K603 Anal fistula: Secondary | ICD-10-CM | POA: Diagnosis not present

## 2016-07-30 DIAGNOSIS — Z01818 Encounter for other preprocedural examination: Secondary | ICD-10-CM | POA: Insufficient documentation

## 2016-07-30 LAB — BASIC METABOLIC PANEL
ANION GAP: 6 (ref 5–15)
BUN: 9 mg/dL (ref 6–20)
CHLORIDE: 109 mmol/L (ref 101–111)
CO2: 25 mmol/L (ref 22–32)
Calcium: 9 mg/dL (ref 8.9–10.3)
Creatinine, Ser: 0.82 mg/dL (ref 0.61–1.24)
GFR calc Af Amer: >60 mL/min (ref >60)
GFR calc non Af Amer: >60 mL/min (ref >60)
Glucose, Bld: 97 mg/dL (ref 65–99)
POTASSIUM: 4.3 mmol/L (ref 3.5–5.1)
Sodium: 140 mmol/L (ref 135–145)

## 2016-07-30 LAB — CBC
HCT: 46.4 % (ref 39.0–52.0)
HEMOGLOBIN: 15.9 g/dL (ref 13.0–17.0)
MCH: 31.6 pg (ref 26.0–34.0)
MCHC: 34.3 g/dL (ref 30.0–36.0)
MCV: 92.2 fL (ref 78.0–100.0)
Platelets: 242 10*3/uL (ref 150–400)
RBC: 5.03 MIL/uL (ref 4.22–5.81)
RDW: 13.5 % (ref 11.5–15.5)
WBC: 8.1 10*3/uL (ref 4.0–10.5)

## 2016-07-30 MED ORDER — CHLORHEXIDINE GLUCONATE CLOTH 2 % EX PADS: 6.0000 | MEDICATED_PAD | Freq: Once | CUTANEOUS | Status: DC

## 2016-08-02 ENCOUNTER — Ambulatory Visit (AMBULATORY_SURGERY_CENTER): Payer: BLUE CROSS/BLUE SHIELD | Admitting: Gastroenterology

## 2016-08-02 ENCOUNTER — Encounter: Payer: Self-pay | Admitting: Gastroenterology

## 2016-08-02 VITALS — BP 124/72 | HR 59 | Temp 98.4°F | Resp 15 | Ht 73.0 in | Wt 184.0 lb

## 2016-08-02 DIAGNOSIS — K603 Anal fistula: Secondary | ICD-10-CM | POA: Diagnosis not present

## 2016-08-02 DIAGNOSIS — R197 Diarrhea, unspecified: Secondary | ICD-10-CM | POA: Diagnosis not present

## 2016-08-02 DIAGNOSIS — R109 Unspecified abdominal pain: Secondary | ICD-10-CM | POA: Diagnosis present

## 2016-08-02 DIAGNOSIS — K921 Melena: Secondary | ICD-10-CM | POA: Diagnosis not present

## 2016-08-02 HISTORY — PX: COLONOSCOPY W/ BIOPSIES: SHX1374

## 2016-08-02 MED ORDER — SODIUM CHLORIDE 0.9 % IV SOLN: 500.0000 mL | INTRAVENOUS | Status: DC

## 2016-08-02 NOTE — Op Note (Signed)
[facility] Patient Name: Daniel [name] Procedure Date: 08/02/2016 9:09 AM MRN: [id number] Endoscopist: Viviann Spare P. Rashed Edler MD, MD Age: 61 Referring MD:  Date of Birth: 1955-10-04 Gender: Male Account #: [id number] Procedure:                Colonoscopy Indications:              Abdominal pain, Chronic diarrhea, history of                            perianal fistula / abscess, history of duodenal                            switch surgery - colonoscopy to further evaluate Medicines:                Monitored Anesthesia Care Procedure:                Pre-Anesthesia Assessment:                           - Prior to the procedure, a History and Physical                            was performed, and patient medications and                            allergies were reviewed. The patient's tolerance of                            previous anesthesia was also reviewed. The risks                            and benefits of the procedure and the sedation                            options and risks were discussed with the patient.                            All questions were answered, and informed consent                            was obtained. Prior Anticoagulants: The patient has                            taken no previous anticoagulant or antiplatelet                            agents. ASA Grade Assessment: II - A patient with                            mild systemic disease. After reviewing the risks                            and benefits, the patient was deemed in  satisfactory condition to undergo the procedure.                           After obtaining informed consent, the colonoscope                            was passed under direct vision. Throughout the                            procedure, the patient's blood pressure, pulse, and                            oxygen saturations were monitored continuously. The                            Colonoscope  was introduced through the anus and                            advanced to the the terminal ileum, with                            identification of the appendiceal orifice and IC                            valve. The colonoscopy was performed without                            difficulty. The patient tolerated the procedure                            well. The quality of the bowel preparation was                            fair. The terminal ileum, ileocecal valve,                            appendiceal orifice, and rectum were photographed. Scope In: 9:22:46 AM Scope Out: 9:37:30 AM Scope Withdrawal Time: 0 hours 11 minutes 49 seconds  Total Procedure Duration: 0 hours 14 minutes 44 seconds  Findings:                 The perianal exam findings include non-thrombosed                            external hemorrhoids and perianal fistulous tract                            without any obvious purulence.                           A large amount of semi-liquid stool was found in                            the entire colon, making visualization difficult,  worse in the left and transverse colon. Lavage of                            the area was performed using copious amounts of                            sterile water, resulting in incomplete clearance                            with fair visualization.                           Inflammation, mild in severity and characterized by                            patchy erosions and erythema was found in the                            terminal ileum. Biopsies were taken with a cold                            forceps for histology.                           A few small-mouthed diverticula were found in the                            left colon.                           Internal hemorrhoids were found during                            retroflexion. The hemorrhoids were large.                           The exam was otherwise  without abnormality. No                            large polyps or mass lesions appreciated, the the                            bowel preparation was inadequate for screening                            purposes. No obvious inflammatory changes of the                            colon appreciated.                           Biopsies for histology were taken with a cold                            forceps from the right colon, left colon and  transverse colon for evaluation of microscopic                            colitis. Complications:            No immediate complications. Estimated blood loss:                            Minimal. Estimated Blood Loss:     Estimated blood loss was minimal. Impression:               - Preparation of the colon was fair, inadequate for                            screening purposes.                           - Non-thrombosed external hemorrhoids and perianal                            fistula tract found on perianal exam.                           - Mild inflammatory changes of the ileum noted.                            Biopsied.                           - Diverticulosis in the left colon.                           - Large internal hemorrhoids.                           - The examination was otherwise normal.                           - Biopsies were taken with a cold forceps from the                            right colon, left colon and transverse colon for                            evaluation of microscopic colitis. Recommendation:           - Patient has a contact number available for                            emergencies. The signs and symptoms of potential                            delayed complications were discussed with the                            patient. Return to normal activities tomorrow.  Written discharge instructions were provided to the                            patient.                            - Resume previous diet.                           - Continue present medications.                           - Await pathology results.                           - Proceed with EUA as scheduled with Dr. Magnus Ivan                           - No ibuprofen, naproxen, or other non-steroidal                            anti-inflammatory drugs Jaiveon Suppes P. Morgana Rowley MD, MD 08/02/2016 9:46:45 AM This report has been signed electronically.

## 2016-08-02 NOTE — Patient Instructions (Signed)
YOU HAD AN ENDOSCOPIC PROCEDURE TODAY AT THE [facility]:   Refer to the procedure report that was given to you for any specific questions about what was found during the examination.  If the procedure report does not answer your questions, please call your gastroenterologist to clarify.  If you requested that your care partner not be given the details of your procedure findings, then the procedure report has been included in a sealed envelope for you to review at your convenience later.  YOU SHOULD EXPECT: Some feelings of bloating in the abdomen. Passage of more gas than usual.  Walking can help get rid of the air that was put into your GI tract during the procedure and reduce the bloating. If you had a lower endoscopy (such as a colonoscopy or flexible sigmoidoscopy) you may notice spotting of blood in your stool or on the toilet paper. If you underwent a bowel prep for your procedure, you may not have a normal bowel movement for a few days.  Please Note:  You might notice some irritation and congestion in your nose or some drainage.  This is from the oxygen used during your procedure.  There is no need for concern and it should clear up in a day or so.  SYMPTOMS TO REPORT IMMEDIATELY:   Following lower endoscopy (colonoscopy or flexible sigmoidoscopy):  Excessive amounts of blood in the stool  Significant tenderness or worsening of abdominal pains  Swelling of the abdomen that is new, acute  Fever of 100F or higher    For urgent or emergent issues, a gastroenterologist can be reached at any hour by calling [phone].   DIET:  We do recommend a small meal at first, but then you may proceed to your regular diet.  Drink plenty of fluids but you should avoid alcoholic beverages for 24 hours.  ACTIVITY:  You should plan to take it easy for the rest of today and you should NOT DRIVE or use heavy machinery until tomorrow (because of the sedation medicines used during the test).     FOLLOW UP: Our staff will call the number listed on your records the next business day following your procedure to check on you and address any questions or concerns that you may have regarding the information given to you following your procedure. If we do not reach you, we will leave a message.  However, if you are feeling well and you are not experiencing any problems, there is no need to return our call.  We will assume that you have returned to your regular daily activities without incident.  If any biopsies were taken you will be contacted by phone or by letter within the next 1-3 weeks.  Please call us at [phone] if you have not heard about the biopsies in 3 weeks.   Await for biopsy results to determined next repeat Colonoscopy screning Hemorrhoids (handout given) Diverticulosis (handout given) No Ibuprofen, naproxen, or other non-steriodal anti-inflammatory drugs for two weeks Proceed with EUA as scheduled with Dr. Magnus IvanBlackman   SIGNATURES/CONFIDENTIALITY: You and/or your care partner have signed paperwork which will be entered into your electronic medical record.  These signatures attest to the fact that that the information above on your After Visit Summary has been reviewed and is understood.  Full responsibility of the confidentiality of this discharge information lies with you and/or your care-partner.

## 2016-08-02 NOTE — Progress Notes (Signed)
Pt's states no medical or surgical changes since previsit or office visit. 

## 2016-08-02 NOTE — Progress Notes (Signed)
To recovery, report to Jones, RN, VSS 

## 2016-08-02 NOTE — Progress Notes (Signed)
Called to room to assist during endoscopic procedure.  Patient ID and intended procedure confirmed with present staff. Received instructions for my participation in the procedure from the performing physician.  

## 2016-08-03 ENCOUNTER — Telehealth: Payer: Self-pay | Admitting: *Deleted

## 2016-08-03 NOTE — Telephone Encounter (Signed)
  Follow up Call-  Call back number 08/02/2016  Post procedure Call Back phone  # [phone]  Permission to leave phone message Yes  Some recent data might be hidden     Patient questions:  Do you have a fever, pain , or abdominal swelling? No. Pain Score  0 *  Have you tolerated food without any problems? Yes.    Have you been able to return to your normal activities? Yes.    Do you have any questions about your discharge instructions: Diet   No. Medications  No. Follow up visit  No.  Do you have questions or concerns about your Care? No.  Actions: * If pain score is 4 or above: No action needed, pain <4.

## 2016-08-06 NOTE — H&P (Signed)
Daniel [name]  Location: [facility] Patient #: [id number] DOB: 05/22/1955 Single / Language: [place] / Race: White Male   History of Present Illness   The patient is a 61 year old male who presents with a complaint of anal problems. He is here for a follow-up from his chronic anal fistula and prolapsing external hemorrhoids. He reports still having discomfort and occasional rectal bleeding. He has to reduce his hemorrhoids every time he has a bowel movement now. He had no relief with sitz bath and steroid creams and suppositories. He will be undergoing a colonoscopy on May 17. He has had no issues with incontinence.  Other Problems  Anxiety Disorder  Arthritis  Chest pain  Chronic Obstructive Lung Disease  Diabetes Mellitus  Enlarged Prostate  Hemorrhoids  High blood pressure  Hypercholesterolemia  Sleep Apnea  Allergies (Janette Ranson, CMA Allergies Reconciled   Past Surgical History  Gastric Bypass  Hemorrhoidectomy  Lung Surgery  Right. Oral Surgery    Medication History (Janette Ranson, CMA Furosemide (20MG  Tablet, Oral) Active. Medications Reconciled Medication History   AM) Wellbutrin SR (150MG  Tablet ER 12HR, Oral) Active. Clobetasol Propionate (0.05% Solution, External) Active. DiazePAM (5MG  Tablet, Oral) Active. DULoxetine HCl (30MG  Capsule DR Part, Oral) Active. Lisinopril (2.5MG  Tablet, Oral) Active. Bystolic (5MG  Tablet, Oral) Active. Oxycodone-Acetaminophen (5-325MG  Tablet, Oral) Active. Viagra (100MG  Tablet, Oral) Active. Vitamin D (Ergocalciferol) (50000UNIT Capsule, Oral) Active. Zolpidem Tartrate (10MG  Tablet, Oral) Active. Medications Reconciled  Vitals   Weight: 177.8 lb Height: 73in Body Surface Area: 2.05 m Body Mass Index: 23.46 kg/m  Temp.: 98.33F  Pulse: 59 (Regular)  BP: 110/76 (Sitting, Left Arm, Standard)     Physical Exam (Secret Kristensen A. Magnus Ivan MD; 07/02/2016  The physical  exam findings are as follows: Note:On examination, he has prolapsing external hemorrhoids on the right lateral side. On the left side to the posterior midline, there are multiple cysts and these fistulous site. There is no current infection or drainage. Lungs [name] CV RRR abd soft, NT Ext without edema   Assessment & Plan   EXTERNAL HEMORRHOIDS WITH COMPLICATION (K64.4)  Impression: He has failed conservative management so an examination under anesthesia with hemorrhoidectomy and fistulotomy is recommended. He will have his colonoscopy performed for the procedure. I discussed the surgical procedure in detail. I discussed the risks which includes but is not limited to bleeding, infection, recurrent hemorrhoids, recurrent fistula, incontinence issues, coronary issues, DVT, etc. He understands and wished to proceed with surgery which will be scheduled ANAL FISTULA (K60.3)

## 2016-08-07 ENCOUNTER — Encounter (HOSPITAL_COMMUNITY): Payer: Self-pay | Admitting: General Practice

## 2016-08-07 ENCOUNTER — Ambulatory Visit (HOSPITAL_COMMUNITY): Payer: BLUE CROSS/BLUE SHIELD | Admitting: Anesthesiology

## 2016-08-07 ENCOUNTER — Ambulatory Visit (HOSPITAL_COMMUNITY)
Admission: RE | Admit: 2016-08-07 | Discharge: 2016-08-07 | Disposition: A | Discharge status: Home / Self Care | Payer: BLUE CROSS/BLUE SHIELD | Source: Ambulatory Visit | Attending: Surgery | Admitting: Surgery

## 2016-08-07 ENCOUNTER — Encounter (HOSPITAL_COMMUNITY)
Admission: RE | Disposition: A | Discharge status: Home / Self Care | Payer: Self-pay | Source: Ambulatory Visit | Attending: Surgery

## 2016-08-07 DIAGNOSIS — N400 Enlarged prostate without lower urinary tract symptoms: Secondary | ICD-10-CM | POA: Diagnosis not present

## 2016-08-07 DIAGNOSIS — Z79899 Other long term (current) drug therapy: Secondary | ICD-10-CM | POA: Insufficient documentation

## 2016-08-07 DIAGNOSIS — K642 Third degree hemorrhoids: Secondary | ICD-10-CM | POA: Diagnosis not present

## 2016-08-07 DIAGNOSIS — J449 Chronic obstructive pulmonary disease, unspecified: Secondary | ICD-10-CM | POA: Diagnosis not present

## 2016-08-07 DIAGNOSIS — I10 Essential (primary) hypertension: Secondary | ICD-10-CM | POA: Insufficient documentation

## 2016-08-07 DIAGNOSIS — K648 Other hemorrhoids: Secondary | ICD-10-CM | POA: Diagnosis not present

## 2016-08-07 DIAGNOSIS — F419 Anxiety disorder, unspecified: Secondary | ICD-10-CM | POA: Insufficient documentation

## 2016-08-07 DIAGNOSIS — K644 Residual hemorrhoidal skin tags: Secondary | ICD-10-CM | POA: Diagnosis not present

## 2016-08-07 DIAGNOSIS — E119 Type 2 diabetes mellitus without complications: Secondary | ICD-10-CM | POA: Insufficient documentation

## 2016-08-07 DIAGNOSIS — K603 Anal fistula: Secondary | ICD-10-CM | POA: Diagnosis not present

## 2016-08-07 DIAGNOSIS — E7800 Pure hypercholesterolemia, unspecified: Secondary | ICD-10-CM | POA: Diagnosis not present

## 2016-08-07 DIAGNOSIS — F17210 Nicotine dependence, cigarettes, uncomplicated: Secondary | ICD-10-CM | POA: Insufficient documentation

## 2016-08-07 DIAGNOSIS — K649 Unspecified hemorrhoids: Secondary | ICD-10-CM | POA: Diagnosis not present

## 2016-08-07 HISTORY — PX: HEMORRHOID SURGERY: SHX153

## 2016-08-07 HISTORY — PX: ANAL FISTULOTOMY: SHX6423

## 2016-08-07 LAB — GLUCOSE, CAPILLARY: Glucose-Capillary: 96 mg/dL (ref 65–99)

## 2016-08-07 SURGERY — EXAM UNDER ANESTHESIA
Anesthesia: General

## 2016-08-07 MED ORDER — CEFAZOLIN SODIUM-DEXTROSE 2-4 GM/100ML-% IV SOLN
2.0000 g | INTRAVENOUS | Status: AC
  Administered 2016-08-07: 2 g via INTRAVENOUS
  Filled 2016-08-07: qty 100

## 2016-08-07 MED ORDER — LIDOCAINE 2% (20 MG/ML) 5 ML SYRINGE
INTRAMUSCULAR | Status: DC | PRN
  Administered 2016-08-07: 60 mg via INTRAVENOUS

## 2016-08-07 MED ORDER — SODIUM CHLORIDE 0.9% FLUSH: 3.0000 mL | INTRAVENOUS | Status: DC | PRN

## 2016-08-07 MED ORDER — ACETAMINOPHEN 325 MG PO TABS: 650.0000 mg | ORAL_TABLET | ORAL | Status: DC | PRN

## 2016-08-07 MED ORDER — OXYCODONE-ACETAMINOPHEN 5-325 MG PO TABS: 1.0000 | ORAL_TABLET | ORAL | 0 refills | Status: DC | PRN

## 2016-08-07 MED ORDER — OXYCODONE HCL 5 MG PO TABS
5.0000 mg | ORAL_TABLET | Freq: Once | ORAL | Status: DC | PRN
Start: 2016-08-07 — End: 2016-08-07

## 2016-08-07 MED ORDER — PROPOFOL 10 MG/ML IV BOLUS
INTRAVENOUS | Status: AC
  Filled 2016-08-07: qty 20

## 2016-08-07 MED ORDER — ROCURONIUM BROMIDE 10 MG/ML (PF) SYRINGE
PREFILLED_SYRINGE | INTRAVENOUS | Status: DC | PRN
  Administered 2016-08-07: 30 mg via INTRAVENOUS

## 2016-08-07 MED ORDER — FENTANYL CITRATE (PF) 100 MCG/2ML IJ SOLN
INTRAMUSCULAR | Status: DC | PRN
  Administered 2016-08-07: 100 ug via INTRAVENOUS
  Administered 2016-08-07: 50 ug via INTRAVENOUS

## 2016-08-07 MED ORDER — MIDAZOLAM HCL 2 MG/2ML IJ SOLN
INTRAMUSCULAR | Status: AC
  Filled 2016-08-07: qty 2

## 2016-08-07 MED ORDER — SODIUM CHLORIDE 0.9% FLUSH: 3.0000 mL | Freq: Two times a day (BID) | INTRAVENOUS | Status: DC

## 2016-08-07 MED ORDER — NEOSTIGMINE METHYLSULFATE 5 MG/5ML IV SOSY
PREFILLED_SYRINGE | INTRAVENOUS | Status: DC | PRN
  Administered 2016-08-07: 4 mg via INTRAVENOUS

## 2016-08-07 MED ORDER — GLYCOPYRROLATE 0.2 MG/ML IV SOSY
PREFILLED_SYRINGE | INTRAVENOUS | Status: DC | PRN
  Administered 2016-08-07: .6 mg via INTRAVENOUS

## 2016-08-07 MED ORDER — OXYCODONE HCL 5 MG/5ML PO SOLN: 5.0000 mg | Freq: Once | ORAL | Status: DC | PRN

## 2016-08-07 MED ORDER — BUPIVACAINE LIPOSOME 1.3 % IJ SUSP
20.0000 mL | INTRAMUSCULAR | Status: AC
  Administered 2016-08-07: 20 mL
  Filled 2016-08-07: qty 20

## 2016-08-07 MED ORDER — MORPHINE SULFATE (PF) 2 MG/ML IV SOLN: 1.0000 mg | INTRAVENOUS | Status: DC | PRN

## 2016-08-07 MED ORDER — PROMETHAZINE HCL 25 MG/ML IJ SOLN: 6.2500 mg | INTRAMUSCULAR | Status: DC | PRN

## 2016-08-07 MED ORDER — LIDOCAINE 5 % EX OINT: 1.0000 "application " | TOPICAL_OINTMENT | CUTANEOUS | 0 refills | Status: DC | PRN

## 2016-08-07 MED ORDER — ACETAMINOPHEN 650 MG RE SUPP: 650.0000 mg | RECTAL | Status: DC | PRN

## 2016-08-07 MED ORDER — FENTANYL CITRATE (PF) 250 MCG/5ML IJ SOLN
INTRAMUSCULAR | Status: AC
  Filled 2016-08-07: qty 5

## 2016-08-07 MED ORDER — MIDAZOLAM HCL 5 MG/5ML IJ SOLN
INTRAMUSCULAR | Status: DC | PRN
  Administered 2016-08-07: 2 mg via INTRAVENOUS

## 2016-08-07 MED ORDER — OXYCODONE HCL 5 MG PO TABS: 5.0000 mg | ORAL_TABLET | ORAL | Status: DC | PRN

## 2016-08-07 MED ORDER — ONDANSETRON HCL 4 MG/2ML IJ SOLN
INTRAMUSCULAR | Status: DC | PRN
  Administered 2016-08-07: 4 mg via INTRAVENOUS

## 2016-08-07 MED ORDER — SODIUM CHLORIDE 0.9 % IV SOLN: 250.0000 mL | INTRAVENOUS | Status: DC | PRN

## 2016-08-07 MED ORDER — PROPOFOL 10 MG/ML IV BOLUS
INTRAVENOUS | Status: DC | PRN
  Administered 2016-08-07: 200 mg via INTRAVENOUS

## 2016-08-07 MED ORDER — LACTATED RINGERS IV SOLN
INTRAVENOUS | Status: DC
  Administered 2016-08-07 (×2): via INTRAVENOUS

## 2016-08-07 MED ORDER — HYDROMORPHONE HCL 1 MG/ML IJ SOLN: 0.2500 mg | INTRAMUSCULAR | Status: DC | PRN

## 2016-08-07 SURGICAL SUPPLY — 38 items
CANISTER SUCT 3000ML PPV (MISCELLANEOUS) ×2 IMPLANT
COVER SURGICAL LIGHT HANDLE (MISCELLANEOUS) ×2 IMPLANT
DECANTER SPIKE VIAL GLASS SM (MISCELLANEOUS) ×2 IMPLANT
DRAPE HALF SHEET 40X57 (DRAPES) ×2 IMPLANT
DRAPE UTILITY XL STRL (DRAPES) ×4 IMPLANT
DRSG PAD ABDOMINAL 8X10 ST (GAUZE/BANDAGES/DRESSINGS) ×2 IMPLANT
ELECT CAUTERY BLADE 6.4 (BLADE) ×2 IMPLANT
ELECT REM PT RETURN 9FT ADLT (ELECTROSURGICAL) ×2
ELECTRODE REM PT RTRN 9FT ADLT (ELECTROSURGICAL) ×1 IMPLANT
GAUZE SPONGE 4X4 12PLY STRL (GAUZE/BANDAGES/DRESSINGS) ×2 IMPLANT
GAUZE SPONGE 4X4 12PLY STRL LF (GAUZE/BANDAGES/DRESSINGS) ×1 IMPLANT
GAUZE SPONGE 4X4 16PLY XRAY LF (GAUZE/BANDAGES/DRESSINGS) ×2 IMPLANT
GLOVE SURG SIGNA 7.5 PF LTX (GLOVE) ×2 IMPLANT
GOWN STRL REUS W/ TWL LRG LVL3 (GOWN DISPOSABLE) ×1 IMPLANT
GOWN STRL REUS W/ TWL XL LVL3 (GOWN DISPOSABLE) ×1 IMPLANT
GOWN STRL REUS W/TWL LRG LVL3 (GOWN DISPOSABLE) ×2
GOWN STRL REUS W/TWL XL LVL3 (GOWN DISPOSABLE) ×2
KIT BASIN OR (CUSTOM PROCEDURE TRAY) ×2 IMPLANT
KIT ROOM TURNOVER OR (KITS) ×2 IMPLANT
NDL HYPO 25GX1X1/2 BEV (NEEDLE) ×1 IMPLANT
NEEDLE HYPO 25GX1X1/2 BEV (NEEDLE) ×2 IMPLANT
NS IRRIG 1000ML POUR BTL (IV SOLUTION) ×2 IMPLANT
PACK LITHOTOMY IV (CUSTOM PROCEDURE TRAY) ×2 IMPLANT
PAD ABD 8X10 STRL (GAUZE/BANDAGES/DRESSINGS) ×1 IMPLANT
PAD ARMBOARD 7.5X6 YLW CONV (MISCELLANEOUS) ×2 IMPLANT
PENCIL BUTTON HOLSTER BLD 10FT (ELECTRODE) ×2 IMPLANT
SPONGE HEMORRHOID 8X3CM (HEMOSTASIS) ×1 IMPLANT
SPONGE SURGIFOAM ABS GEL 100 (HEMOSTASIS) IMPLANT
SURGILUBE 2OZ TUBE FLIPTOP (MISCELLANEOUS) ×2 IMPLANT
SUT CHROMIC 2 0 SH (SUTURE) ×3 IMPLANT
SYR BULB 3OZ (MISCELLANEOUS) ×2 IMPLANT
SYR CONTROL 10ML LL (SYRINGE) ×2 IMPLANT
TOWEL OR 17X24 6PK STRL BLUE (TOWEL DISPOSABLE) ×2 IMPLANT
TOWEL OR 17X26 10 PK STRL BLUE (TOWEL DISPOSABLE) ×2 IMPLANT
TRAY PROCTOSCOPIC FIBER OPTIC (SET/KITS/TRAYS/PACK) IMPLANT
TUBE CONNECTING 12X1/4 (SUCTIONS) ×2 IMPLANT
UNDERPAD 30X30 (UNDERPADS AND DIAPERS) ×2 IMPLANT
YANKAUER SUCT BULB TIP NO VENT (SUCTIONS) ×2 IMPLANT

## 2016-08-07 NOTE — Progress Notes (Signed)
Dr Roanna Banning notified of patients staus for discharge. P awake and oriented x4. Patient has met discharge criteria. No C/O pain. Dr Roanna Banning ok with discharge.

## 2016-08-07 NOTE — Op Note (Signed)
EXAM UNDER ANESTHESIA, HEMORRHOIDECTOMY, ANAL FISTULOTOMY  Procedure Note  Audie ClearRobert M Cuff 08/07/2016   Pre-op Diagnosis: Anal fistula, prolapsing external/internal hemorrhoids     Post-op Diagnosis: same  Procedure(s): EXAM UNDER ANESTHESIA HEMORRHOIDECTOMY (2 column internal/external) ANAL FISTULOTOMY  Surgeon(s): Abigail MiyamotoBlackman, Thurston Brendlinger, MD  Anesthesia: General  Staff:  Circulator: Royann ShiversLloyd, Peyton, RN; Hermelinda DellenSumner, Tracie M, RN Scrub Person: Carmela RimaLeggio, Rebecca K  Estimated Blood Loss: Minimal               Specimens: sent to path  Findings: The patient was found to have 2 large internal/external hemorrhoidal columns which were excised. The perianal fistula on the left posterior side was also identified and a fistulotomy was performed.  Procedure: The patient was brought to the operating room and identified as the correct patient. He was placed supine on the operating table and general anesthesia was induced. He was then placed in the lithotomy position. His perianal area was prepped and draped in the usual sterile fashion. I inserted a retractor into the anal canal. He had 2 large internal/external hemorrhoidal columns on the left and right side of the posterior midline. I excised both of these columns with the harmonic scalpel. I then closed the defect with running 2-0 chromic sutures. I then identified a small fistula opening with a fistula probe and then performed a fistulotomy of the probe. This was adjacent to the left-sided hemorrhoid. I cauterized the fistula tract. I left it open. I then injected Exparel circumferentially around the perianal area. I placed these of Gelfoam into the anal canal. The patient tolerated the procedure well. All the counts were correct at the end of the procedure. The patient was then stated in the operating room and taken in a stable condition to the recovery room.          Anil Havard A   Date: 08/07/2016  Time: 11:12 AM

## 2016-08-07 NOTE — Interval H&P Note (Signed)
History and Physical Interval Note: no change in H and P  08/07/2016 10:27 AM  Daniel [name]  has presented today for surgery, with the diagnosis of Anal fistula, prolapsing external hemorrhoids  The various methods of treatment have been discussed with the patient and family. After consideration of risks, benefits and other options for treatment, the patient has consented to  Procedure(s): EXAM UNDER ANESTHESIA (N/A) HEMORRHOIDECTOMY (N/A) ANAL FISTULOTOMY (N/A) as a surgical intervention .  The patient's history has been reviewed, patient examined, no change in status, stable for surgery.  I have reviewed the patient's chart and labs.  Questions were answered to the patient's satisfaction.     [name] A

## 2016-08-07 NOTE — Transfer of Care (Signed)
Immediate Anesthesia Transfer of Care Note  Patient: Daniel ClearRobert M [name]  Procedure(s) Performed: Procedure(s): EXAM UNDER ANESTHESIA (N/A) HEMORRHOIDECTOMY (N/A) ANAL FISTULOTOMY (N/A)  Patient Location: PACU  Anesthesia Type: General   Level of Consciousness: patient cooperative and responds to stimulation  Airway & Oxygen Therapy: Patient Spontanous Breathing and Patient connected to face mask oxygen  Post-op Assessment: Report given to RN and Post -op Vital signs reviewed and stable  Post vital signs: Reviewed and stable  Last Vitals:  Vitals:   08/07/16 0942  BP: 122/78  Pulse: (!) 57  Resp: 20  Temp: 36.9 C    Last Pain:  Vitals:   08/07/16 0942  TempSrc: Oral      Patients Stated Pain Goal: 3 (08/07/16 0955)  Complications: No apparent anesthesia complications

## 2016-08-07 NOTE — Anesthesia Preprocedure Evaluation (Addendum)
Anesthesia Evaluation  Patient identified by MRN, date of birth, ID band Patient awake    Reviewed: Allergy & Precautions, NPO status , Patient's Chart, lab work & pertinent test results  Airway Mallampati: II  TM Distance: >3 FB Neck ROM: Full    Dental no notable dental hx.    Pulmonary Current Smoker,    Pulmonary exam normal breath sounds clear to auscultation       Cardiovascular Exercise Tolerance: Good hypertension, Pt. on medications Normal cardiovascular exam Rhythm:Regular Rate:Normal  ECG: NSR, rate 61   Neuro/Psych Anxiety Depression negative neurological ROS     GI/Hepatic negative GI ROS, Neg liver ROS,   Endo/Other    Renal/GU negative Renal ROS  negative genitourinary   Musculoskeletal negative musculoskeletal ROS (+)   Abdominal   Peds negative pediatric ROS (+)  Hematology negative hematology ROS (+)   Anesthesia Other Findings   Reproductive/Obstetrics negative OB ROS                            Anesthesia Physical Anesthesia Plan  ASA: II  Anesthesia Plan: General   Post-op Pain Management:    Induction: Intravenous  Airway Management Planned: Oral ETT  Additional Equipment:   Intra-op Plan:   Post-operative Plan: Extubation in OR  Informed Consent: I have reviewed the patients History and Physical, chart, labs and discussed the procedure including the risks, benefits and alternatives for the proposed anesthesia with the patient or authorized representative who has indicated his/her understanding and acceptance.   Dental advisory given  Plan Discussed with: CRNA  Anesthesia Plan Comments:        Anesthesia Quick Evaluation

## 2016-08-07 NOTE — Anesthesia Postprocedure Evaluation (Signed)
Anesthesia Post Note  Patient: Audie ClearRobert M Shular  Procedure(s) Performed: Procedure(s) (LRB): EXAM UNDER ANESTHESIA (N/A) HEMORRHOIDECTOMY (N/A) ANAL FISTULOTOMY (N/A)  Patient location during evaluation: PACU Anesthesia Type: General Level of consciousness: awake and alert Pain management: pain level controlled Vital Signs Assessment: post-procedure vital signs reviewed and stable Respiratory status: spontaneous breathing, nonlabored ventilation, respiratory function stable and patient connected to nasal cannula oxygen Cardiovascular status: blood pressure returned to baseline and stable Postop Assessment: no signs of nausea or vomiting Anesthetic complications: no       Last Vitals:  Vitals:   08/07/16 1135 08/07/16 1157  BP: 129/79 139/82  Pulse: (!) 55 (!) 58  Resp: 13 16  Temp: 36.6 C     Last Pain:  Vitals:   08/07/16 0942  TempSrc: Oral                 Ryan P Ellender

## 2016-08-07 NOTE — Anesthesia Procedure Notes (Signed)
Procedure Name: Intubation Date/Time: 08/07/2016 10:45 AM Performed by: Judeth Cornfield T Pre-anesthesia Checklist: Patient identified, Emergency Drugs available, Suction available and Patient being monitored Patient Re-evaluated:Patient Re-evaluated prior to inductionOxygen Delivery Method: Circle System Utilized Preoxygenation: Pre-oxygenation with 100% oxygen Intubation Type: IV induction Ventilation: Mask ventilation without difficulty Laryngoscope Size: Mac and 3 Grade View: Grade II Tube type: Oral Tube size: 7.5 mm Number of attempts: 1 Airway Equipment and Method: Stylet and Oral airway Placement Confirmation: ETT inserted through vocal cords under direct vision,  positive ETCO2 and breath sounds checked- equal and bilateral Secured at: 24 cm Tube secured with: Tape Dental Injury: Teeth and Oropharynx as per pre-operative assessment

## 2016-08-07 NOTE — Discharge Instructions (Signed)
[facility] _______[facility] ° °RECTAL SURGERY POST OP INSTRUCTIONS: POST OP INSTRUCTIONS ° °Always review your discharge instruction sheet given to you by the facility where your surgery was performed. °IF YOU HAVE DISABILITY OR FAMILY LEAVE FORMS, YOU MUST BRING THEM TO THE OFFICE FOR PROCESSING.   °DO NOT GIVE THEM TO YOUR DOCTOR. ° °1. A  prescription for pain medication may be given to you upon discharge.  Take your pain medication as prescribed, if needed.  If narcotic pain medicine is not needed, then you may take acetaminophen (Tylenol) or ibuprofen (Advil) as needed. °2. Take your usually prescribed medications unless otherwise directed. °3. If you need a refill on your pain medication, please contact your pharmacy.  They will contact our office to request authorization. Prescriptions will not be filled after 5 pm or on week-ends. °4. You should follow a light diet the first 48 hours after arrival home, such as soup and crackers, etc.  Be sure to include lots of fluids daily.  Resume your normal diet 2-3 days after surgery.. °5. Most patients will experience some swelling and discomfort in the rectal area. Ice packs, reclining and warm tub soaks will help.  Swelling and discomfort can take several days to resolve.  °6. It is common to experience some constipation if taking pain medication after surgery.  Increasing fluid intake and taking a stool softener (such as Colace) will usually help or prevent this problem from occurring.  A mild laxative (Milk of Magnesia or Miralax) should be taken according to package directions if there are no bowel movements after 48 hours. °7. Unless discharge instructions indicate otherwise, leave your bandage dry and in place for 24 hours, or remove the bandage if you have a bowel movement. You may notice a small amount of bleeding with bowel movements for the first few days. You may have some packing in the rectum which will come out over the first day or two. You  will need to wear an absorbent pad or soft cotton gauze in your underwear until the drainage stops.it. °8. ACTIVITIES:  You may resume regular (light) daily activities beginning the next day--such as daily self-care, walking, climbing stairs--gradually increasing activities as tolerated.  You may have sexual intercourse when it is comfortable.  Refrain from any heavy lifting or straining until approved by your doctor. °a. You may drive when you are no longer taking prescription pain medication, you can comfortably wear a seatbelt, and you can safely maneuver your car and apply brakes. °b. RETURN TO WORK: : ____________________ °c.  °9. You should see your doctor in the office for a follow-up appointment approximately 2-3 weeks after your surgery.  Make sure that you call for this appointment within a day or two after you arrive home to insure a convenient appointment time. °10. OTHER INSTRUCTIONS:  __________________________________________________________________________________________________________________________________________________________________________________________  °WHEN TO CALL YOUR DOCTOR: °1. Fever over 101.0 °2. Inability to urinate °3. Nausea and/or vomiting °4. Extreme swelling or bruising °5. Continued bleeding from rectum. °6. Increased pain, redness, or drainage from the incision °7. Constipation ° °The clinic staff is available to answer your questions during regular business hours.  Please don’t hesitate to call and ask to speak to one of the nurses for clinical concerns.  If you have a medical emergency, go to the nearest emergency room or call 911.  A surgeon from [facility] is always on call at the hospital ° ° °[address], [address], [place], [place]  [address] ? °   [address] [address], [place], [place]   [address] °[phone] ? [phone] ? FAX [phone] °Web site: [email or web address] ° °

## 2016-08-08 ENCOUNTER — Encounter (HOSPITAL_COMMUNITY): Payer: Self-pay | Admitting: Surgery

## 2016-08-11 ENCOUNTER — Encounter: Payer: Self-pay | Admitting: Gastroenterology

## 2016-08-24 DIAGNOSIS — L111 Transient acantholytic dermatosis [Grover]: Secondary | ICD-10-CM | POA: Diagnosis not present

## 2016-09-07 ENCOUNTER — Other Ambulatory Visit: Payer: Self-pay

## 2016-09-07 MED ORDER — ZOLPIDEM TARTRATE 10 MG PO TABS: ORAL_TABLET | ORAL | 0 refills | Status: DC

## 2016-09-07 MED ORDER — ZOLPIDEM TARTRATE 10 MG PO TABS: ORAL_TABLET | ORAL | 2 refills | Status: DC

## 2016-09-07 NOTE — Telephone Encounter (Signed)
Requesting:Ambien Contract:Yes 2/29/18  UDS: yes 11/14/16 Last OV:06/21/16 Last Refill: 05/15/16   #[id number]  Please Advise  [facility]lyth  [facility]

## 2016-09-07 NOTE — Telephone Encounter (Signed)
Had to reprint rx bc it had refills on it.

## 2016-09-17 ENCOUNTER — Other Ambulatory Visit: Payer: Self-pay | Admitting: Family Medicine

## 2016-09-17 MED ORDER — NEBIVOLOL HCL 5 MG PO TABS: 5.0000 mg | ORAL_TABLET | Freq: Two times a day (BID) | ORAL | 4 refills | Status: DC

## 2016-09-17 NOTE — Telephone Encounter (Signed)
Refill done as requested 

## 2016-09-17 NOTE — Telephone Encounter (Signed)
Self. Refill request for BYSTOLIC   Pharmacy: [id number] - [place], [place] - [address]

## 2016-09-18 ENCOUNTER — Ambulatory Visit (INDEPENDENT_AMBULATORY_CARE_PROVIDER_SITE_OTHER): Payer: BLUE CROSS/BLUE SHIELD | Admitting: Family Medicine

## 2016-09-18 ENCOUNTER — Encounter: Payer: Self-pay | Admitting: Family Medicine

## 2016-09-18 VITALS — BP 100/62 | HR 72 | Temp 98.2°F | Resp 18 | Wt 176.6 lb

## 2016-09-18 DIAGNOSIS — I10 Essential (primary) hypertension: Secondary | ICD-10-CM | POA: Diagnosis not present

## 2016-09-18 DIAGNOSIS — E60 Dietary zinc deficiency: Secondary | ICD-10-CM

## 2016-09-18 DIAGNOSIS — K146 Glossodynia: Secondary | ICD-10-CM | POA: Diagnosis not present

## 2016-09-18 DIAGNOSIS — E785 Hyperlipidemia, unspecified: Secondary | ICD-10-CM | POA: Diagnosis not present

## 2016-09-18 DIAGNOSIS — E349 Endocrine disorder, unspecified: Secondary | ICD-10-CM

## 2016-09-18 DIAGNOSIS — E559 Vitamin D deficiency, unspecified: Secondary | ICD-10-CM | POA: Diagnosis not present

## 2016-09-18 DIAGNOSIS — E118 Type 2 diabetes mellitus with unspecified complications: Secondary | ICD-10-CM | POA: Diagnosis not present

## 2016-09-18 LAB — COMPREHENSIVE METABOLIC PANEL
ALBUMIN: 3.7 g/dL (ref 3.5–5.2)
ALK PHOS: 102 U/L (ref 39–117)
ALT: 33 U/L (ref 0–53)
AST: 26 U/L (ref 0–37)
BUN: 9 mg/dL (ref 6–23)
CALCIUM: 8.8 mg/dL (ref 8.4–10.5)
CO2: 30 mEq/L (ref 19–32)
Chloride: 108 mEq/L (ref 96–112)
Creatinine, Ser: 0.78 mg/dL (ref 0.40–1.50)
GFR: 107.76 mL/min (ref >60.00)
Glucose, Bld: 96 mg/dL (ref 70–99)
POTASSIUM: 4.5 meq/L (ref 3.5–5.1)
Sodium: 140 mEq/L (ref 135–145)
TOTAL PROTEIN: 6.2 g/dL (ref 6.0–8.3)
Total Bilirubin: 0.3 mg/dL (ref 0.2–1.2)

## 2016-09-18 LAB — CBC WITH DIFFERENTIAL/PLATELET
BASOS ABS: 0 10*3/uL (ref 0.0–0.1)
Basophils Relative: 0.5 % (ref 0.0–3.0)
EOS ABS: 0.4 10*3/uL (ref 0.0–0.7)
Eosinophils Relative: 6.8 % — ABNORMAL HIGH (ref 0.0–5.0)
HEMATOCRIT: 43.2 % (ref 39.0–52.0)
HEMOGLOBIN: 14.6 g/dL (ref 13.0–17.0)
LYMPHS ABS: 1.5 10*3/uL (ref 0.7–4.0)
Lymphocytes Relative: 23.6 % (ref 12.0–46.0)
MCHC: 33.9 g/dL (ref 30.0–36.0)
MCV: 92.3 fl (ref 78.0–100.0)
Monocytes Absolute: 0.3 10*3/uL (ref 0.1–1.0)
Monocytes Relative: 4.5 % (ref 3.0–12.0)
NEUTROS ABS: 4.2 10*3/uL (ref 1.4–7.7)
Neutrophils Relative %: 64.6 % (ref 43.0–77.0)
Platelets: 247 10*3/uL (ref 150.0–400.0)
RBC: 4.68 Mil/uL (ref 4.22–5.81)
RDW: 14 % (ref 11.5–15.5)
WBC: 6.5 10*3/uL (ref 4.0–10.5)

## 2016-09-18 LAB — LIPID PANEL
CHOLESTEROL: 144 mg/dL (ref 0–200)
HDL: 41.5 mg/dL (ref >39.00)
LDL Cholesterol: 85 mg/dL (ref 0–99)
NonHDL: 102.62
TRIGLYCERIDES: 86 mg/dL (ref 0.0–149.0)
Total CHOL/HDL Ratio: 3
VLDL: 17.2 mg/dL (ref 0.0–40.0)

## 2016-09-18 LAB — VITAMIN D 25 HYDROXY (VIT D DEFICIENCY, FRACTURES): VITD: 9.68 ng/mL — AB (ref 30.00–100.00)

## 2016-09-18 LAB — TESTOSTERONE: TESTOSTERONE: 373.75 ng/dL (ref 300.00–890.00)

## 2016-09-18 LAB — MAGNESIUM: MAGNESIUM: 1.9 mg/dL (ref 1.5–2.5)

## 2016-09-18 LAB — TSH: TSH: 1.89 u[IU]/mL (ref 0.35–4.50)

## 2016-09-18 LAB — SEDIMENTATION RATE: Sed Rate: 1 mm/hr (ref 0–20)

## 2016-09-18 LAB — FOLATE: Folate: 1.3 ng/mL — ABNORMAL LOW (ref >5.9)

## 2016-09-18 LAB — VITAMIN B12: Vitamin B-12: 425 pg/mL (ref 211–911)

## 2016-09-18 LAB — HEMOGLOBIN A1C: Hgb A1c MFr Bld: 5.5 % (ref 4.6–6.5)

## 2016-09-18 NOTE — Assessment & Plan Note (Signed)
Check level today 

## 2016-09-18 NOTE — Assessment & Plan Note (Signed)
hgba1c acceptable, minimize simple carbs. Increase exercise as tolerated.  

## 2016-09-18 NOTE — Assessment & Plan Note (Signed)
-  Check testosterone today.  

## 2016-09-18 NOTE — Assessment & Plan Note (Signed)
Well controlled, no changes to meds. Encouraged heart healthy diet such as the DASH diet and exercise as tolerated.  °

## 2016-09-18 NOTE — Assessment & Plan Note (Signed)
Check vitamin levels. Improved with reinstitution of vitamins but still some discomfort. Check labs

## 2016-09-18 NOTE — Progress Notes (Signed)
Subjective:  I acted as a Neurosurgeon for Dr. Abner Greenspan. Daniel [name], [place]  Patient ID: Daniel [name], male    DOB: 1955/07/12, 61 y.o.   MRN: [id number]  No chief complaint on file.   HPI  Patient is in today for an acute visit. Patient c/o his tongue burning for the past 2-3 weeks. He denies eating or drinking anything that could've irritated it. He states it looks as if he has taken a razor blade and put small cuts on it. No recent febrile illness or acute hospitalizations. Denies CP/palp/SOB/HA/congestion/fevers/GI or GU c/o. Taking meds as prescribed. He has stopped his vitamins after his surgery for 2 hemorrhoids and an anal fissure due to his heavy need for narcotics secondary to pain. It was during this time that the tongue burning started upon restarting his vitamins the discomfort is 80 % better. He has recovered from his surgery performed by Dr Rayburn Ma but of course hopes never to have to experience that again. Denies CP/palp/SOB/HA/congestion/fevers or GU c/o. Taking meds as prescribed   Patient Care Team: Bradd Canary, MD as PCP - General (Family Medicine) Portenier, Annabelle Harman, MD as Referring Physician (Surgery) Nita Sells, MD as Consulting Physician (Dermatology)   Past Medical History:  Diagnosis Date  . Anxiety   . BPH (benign prostatic hyperplasia) 08/22/2011  . Colon polyps   . COPD (chronic obstructive pulmonary disease) (HCC)   . Depression   . Diabetes (HCC)    no meds  . H/O gastric bypass 04/27/2016   Duodenal switch at [facility] Jul 30, 2013  . Hemorrhoids 05/22/2015  . Hyperlipidemia   . Hypertension   . Obesity   . Osteoarthritis 01/13/2013  . Osteoarthritis    left knee, treating with injections in [facility]  . Peripheral edema 04/27/2016  . Sleep apnea    Gone per pt  . 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 Tongue burning sensation 09/18/2016  . Zinc deficiency 04/27/2016    Past Surgical History:  Procedure Laterality Date  . ANAL  FISTULOTOMY N/A 08/07/2016   Procedure: ANAL FISTULOTOMY;  Surgeon: Abigail Miyamoto, MD;  Location: [facility] OR;  Service: General;  Laterality: N/A;  . BARIATRIC SURGERY  2015   duodenal switch done at [facility]  . collapsed lung  1983, 1988, 1992  . HEMORRHOID SURGERY N/A 08/07/2016   Procedure: HEMORRHOIDECTOMY;  Surgeon: Abigail Miyamoto, MD;  Location: [facility] OR;  Service: General;  Laterality: N/A;  . IRRIGATION AND DEBRIDEMENT SEBACEOUS CYST     back    Family History  Problem Relation Age of Onset  . Cancer Mother        ovarian  . Hyperlipidemia Father   . Hypertension Father   . Heart disease Father        MI 7/19  . Emphysema Father   . Pulmonary disease Father   . Heart disease Maternal Grandmother   . Heart disease Maternal Grandfather   . Stroke Maternal Grandfather   . Heart disease Paternal Grandmother   . Angina Paternal Grandmother   . Heart disease Paternal Grandfather   . Stroke Paternal Grandfather   . Diabetes Maternal Uncle   . Kidney disease Maternal Uncle   . Diabetes Paternal Uncle   . Cancer Paternal Uncle     Social History   Social History  . Marital status: Single    Spouse name: N/A  . Number of children: 0  . Years of education: N/A   Occupational History  .  accountant    Social History Main Topics  . Smoking status: Current Every Day Smoker    Packs/day: 1.00    Years: 40.00    Types: Cigarettes    Last attempt to quit: 01/08/2013  . Smokeless tobacco: Never Used     Comment: 3-4 cigarettes daily.   Patient started back smoking. smokes one pack per day.  . Alcohol use No     Comment: once a month, glass of wine  . Drug use: No  . Sexual activity: Not on file   Other Topics Concern  . Not on file   Social History Narrative   Regular exercise-no    Outpatient Medications Prior to Visit  Medication Sig Dispense Refill  . buPROPion (WELLBUTRIN XL) 150 MG 24 hr tablet TAKE 1 TABLET THREE TIMES A DAY (Patient taking differently: Take  450mg s daily in the morning) 90 tablet 2  . Calcium Carb-Cholecalciferol (CALCIUM PLUS VITAMIN D3) 600-500 MG-UNIT CAPS Take 1 tablet by mouth daily.    . clobetasol (TEMOVATE) 0.05 % external solution USE TO SCALP AS DIRECTED (Patient taking differently: Apply 1 application topically daily as needed (itching). USE TO SCALP AS DIRECTED) 50 mL 6  . diazepam (VALIUM) 5 MG tablet TAKE 2 TABLETS EVERY 8 HOURS AS NEEDED FOR ANXIETY 90 tablet 1  . DULoxetine (CYMBALTA) 30 MG capsule TAKE 3 CAPSULES (90 MG TOTAL) BY MOUTH DAILY. 90 capsule 5  . finasteride (PROPECIA) 1 MG tablet TAKE 1 TABLET (1 MG TOTAL) BY MOUTH DAILY. 30 tablet 7  . lisinopril (PRINIVIL,ZESTRIL) 2.5 MG tablet TAKE 1 TABLET (2.5 MG TOTAL) BY MOUTH DAILY. 30 tablet 5  . Multiple Vitamins-Minerals (CENTRUM SILVER) tablet Take 2 tablets by mouth daily.    . nebivolol (BYSTOLIC) 5 MG tablet Take 1 tablet (5 mg total) by mouth 2 (two) times daily. 60 tablet 4  . Omega-3 300 MG CAPS Take 300 mg by mouth daily.    . sildenafil (VIAGRA) 100 MG tablet Take 0.5-1 tablets (50-100 mg total) by mouth daily as needed for erectile dysfunction. 10 tablet 2  . tamsulosin (FLOMAX) 0.4 MG CAPS capsule Take 1 capsule (0.4 mg total) by mouth daily. (Patient taking differently: Take 0.4 mg by mouth daily as needed (Urinary retention). ) 30 capsule 5  . vitamin E 200 UNIT capsule Take 200 Units by mouth daily.    Marland Kitchen zinc gluconate 50 MG tablet Take 100 mg by mouth daily.    Marland Kitchen zolpidem (AMBIEN) 10 MG tablet Take 10mg s by mouth daily at bedtime 30 tablet 0  . Cholecalciferol (VITAMIN D3) 1000 units CAPS Take 1,000 Units by mouth daily.    Marland Kitchen VITAMIN A PO Take 2,400 Units by mouth daily.    . Vitamin D, Ergocalciferol, (DRISDOL) 50000 units CAPS capsule Take 1 capsule (50,000 Units total) by mouth 2 (two) times a week. (Patient taking differently: Take 50,000 Units by mouth 2 (two) times a week. Tuesdays and Thursdays) 8 capsule 4  . furosemide (LASIX) 20 MG  tablet TAKE ONE TABLET BY MOUTH ONCE A DAY AS NEEDED FOR SWELLING 30 tablet 1  . lidocaine (XYLOCAINE) 5 % ointment Apply 1 application topically as needed. 35.44 g 0  . meloxicam (MOBIC) 15 MG tablet Take 15 mg by mouth daily as needed for pain.    Marland Kitchen oxyCODONE-acetaminophen (ROXICET) 5-325 MG tablet Take 1-2 tablets by mouth every 4 (four) hours as needed for moderate pain or severe pain. 30 tablet 0  . sulfamethoxazole-trimethoprim (BACTRIM DS,SEPTRA DS)  800-160 MG tablet Take 1 tablet by mouth 2 (two) times daily.    Marland Kitchen 0.9 %  sodium chloride infusion      No facility-administered medications prior to visit.     Allergies  Allergen Reactions  . No Known Allergies     Review of Systems  Constitutional: Negative for fever and malaise/fatigue.  HENT: Negative for congestion and sore throat.   Eyes: Negative for blurred vision.  Respiratory: Negative for cough and shortness of breath.   Cardiovascular: Negative for chest pain, palpitations and leg swelling.  Gastrointestinal: Negative for abdominal pain and vomiting.  Genitourinary: Negative for dysuria.  Musculoskeletal: Negative for back pain.  Skin: Negative for rash.  Neurological: Negative for loss of consciousness and headaches.       Objective:    Physical Exam  Constitutional: He is oriented to person, place, and time. He appears well-developed and well-nourished. No distress.  HENT:  Head: Normocephalic and atraumatic.  Nose: Nose normal.  Mouth/Throat: Oropharynx is clear and moist. No oropharyngeal exudate.  Eyes: Conjunctivae are normal.  Neck: Normal range of motion. No thyromegaly present.  Cardiovascular: Normal rate and regular rhythm.   Pulmonary/Chest: Effort normal and breath sounds normal. He has no wheezes.  Abdominal: Soft. Bowel sounds are normal. There is no tenderness.  Musculoskeletal: Normal range of motion. He exhibits no edema or deformity.  Neurological: He is alert and oriented to person, place,  and time.  Skin: Skin is warm and dry. He is not diaphoretic.  Psychiatric: He has a normal mood and affect.    BP 100/62 (BP Location: Left Arm, Patient Position: Sitting, Cuff Size: Normal)   Pulse 72   Temp 98.2 F (36.8 C) (Oral)   Resp 18   Wt 176 lb 9.6 oz (80.1 kg)   SpO2 97%   BMI 23.30 kg/m  Wt Readings from Last 3 Encounters:  09/18/16 176 lb 9.6 oz (80.1 kg)  08/07/16 175 lb (79.4 kg)  08/02/16 184 lb (83.5 kg)   BP Readings from Last 3 Encounters:  09/18/16 100/62  08/07/16 139/82  08/02/16 124/72     Immunization History  Administered Date(s) Administered  . Influenza Split 12/18/2010, 12/17/2015  . Influenza Whole 12/07/2009  . Influenza,inj,Quad PF,36+ Mos 01/13/2013, 12/16/2014  . Influenza-Unspecified 12/16/2015  . Pneumococcal Conjugate-13 01/13/2013  . Pneumococcal Polysaccharide-23 05/12/2015  . Tdap 09/12/2010    Health Maintenance  Topic Date Due  . OPHTHALMOLOGY EXAM  06/02/2016  . INFLUENZA VACCINE  10/17/2016  . HEMOGLOBIN A1C  10/25/2016  . FOOT EXAM  04/27/2017  . PNEUMOCOCCAL POLYSACCHARIDE VACCINE (2) 05/11/2020  . TETANUS/TDAP  09/11/2020  . COLONOSCOPY  08/03/2026  . Hepatitis C Screening  Completed  . HIV Screening  Completed    Lab Results  Component Value Date   WBC 8.1 07/30/2016   HGB 15.9 07/30/2016   HCT 46.4 07/30/2016   PLT 242 07/30/2016   GLUCOSE 97 07/30/2016   CHOL 169 04/27/2016   TRIG 74.0 04/27/2016   HDL 45.50 04/27/2016   LDLDIRECT 116.5 08/10/2011   LDLCALC 108 (H) 04/27/2016   ALT 32 04/27/2016   AST 27 04/27/2016   NA 140 07/30/2016   K 4.3 07/30/2016   CL 109 07/30/2016   CREATININE 0.82 07/30/2016   BUN 9 07/30/2016   CO2 25 07/30/2016   TSH 2.50 04/27/2016   PSA 0.66 04/27/2016   HGBA1C 5.5 04/27/2016   MICROALBUR 2.0 (H) 10/28/2014    Lab Results  Component Value Date  TSH 2.50 04/27/2016   Lab Results  Component Value Date   WBC 8.1 07/30/2016   HGB 15.9 07/30/2016   HCT 46.4  07/30/2016   MCV 92.2 07/30/2016   PLT 242 07/30/2016   Lab Results  Component Value Date   NA 140 07/30/2016   K 4.3 07/30/2016   CO2 25 07/30/2016   GLUCOSE 97 07/30/2016   BUN 9 07/30/2016   CREATININE 0.82 07/30/2016   BILITOT 0.5 04/27/2016   ALKPHOS 110 04/27/2016   AST 27 04/27/2016   ALT 32 04/27/2016   PROT 6.2 04/27/2016   ALBUMIN 4.1 04/27/2016   CALCIUM 9.0 07/30/2016   ANIONGAP 6 07/30/2016   GFR 109.53 04/27/2016   Lab Results  Component Value Date   CHOL 169 04/27/2016   Lab Results  Component Value Date   HDL 45.50 04/27/2016   Lab Results  Component Value Date   LDLCALC 108 (H) 04/27/2016   Lab Results  Component Value Date   TRIG 74.0 04/27/2016   Lab Results  Component Value Date   CHOLHDL 4 04/27/2016   Lab Results  Component Value Date   HGBA1C 5.5 04/27/2016         Assessment & Plan:   Problem List Items Addressed This Visit    Hypotestosteronemia    Check testosterone today      Hyperlipidemia    Encouraged heart healthy diet, increase exercise, avoid trans fats, consider a krill oil cap daily      Relevant Orders   Lipid panel   Essential hypertension    Well controlled, no changes to meds. Encouraged heart healthy diet such as the DASH diet and exercise as tolerated.       Relevant Orders   CBC with Differential/Platelet   Comprehensive metabolic panel   TSH   Magnesium   DM (diabetes mellitus) (HCC)    hgba1c acceptable, minimize simple carbs. Increase exercise as tolerated.      Relevant Orders   Hemoglobin A1c   Vitamin D deficiency    Check level today      Relevant Orders   VITAMIN D 25 Hydroxy (Vit-D Deficiency, Fractures)   Zinc deficiency    Check level today, takes 2 otc tabs daily      Relevant Orders   Magnesium   Zinc   Tongue burning sensation    Check vitamin levels. Improved with reinstitution of vitamins but still some discomfort. Check labs      Relevant Orders   Sedimentation  rate   Vitamin B12   Folate   Vitamin B1    Other Visit Diagnoses    Testosterone deficiency    -  Primary   Relevant Orders   Testosterone      I have discontinued Mr. Cobaugh furosemide, meloxicam, sulfamethoxazole-trimethoprim, oxyCODONE-acetaminophen, and lidocaine. I am also having him maintain his clobetasol, CENTRUM SILVER, sildenafil, tamsulosin, DULoxetine, Vitamin D (Ergocalciferol), lisinopril, diazepam, buPROPion, finasteride, Omega-3, zinc gluconate, Vitamin D3, VITAMIN A PO, vitamin E, Calcium Carb-Cholecalciferol, zolpidem, and nebivolol. We will stop administering sodium chloride.  No orders of the defined types were placed in this encounter.   CMA served as Neurosurgeon during this visit. History, Physical and Plan performed by medical provider. Documentation and orders reviewed and attested to.  Danise Edge, MD

## 2016-09-18 NOTE — Patient Instructions (Signed)

## 2016-09-18 NOTE — Assessment & Plan Note (Signed)
Check level today, takes 2 otc tabs daily

## 2016-09-18 NOTE — Assessment & Plan Note (Signed)
Encouraged heart healthy diet, increase exercise, avoid trans fats, consider a krill oil cap daily 

## 2016-09-23 LAB — ZINC: ZINC: 79 ug/dL (ref 60–130)

## 2016-09-23 LAB — VITAMIN B1: VITAMIN B1 (THIAMINE): 21 nmol/L (ref 8–30)

## 2016-10-01 ENCOUNTER — Other Ambulatory Visit: Payer: Self-pay | Admitting: Family Medicine

## 2016-10-01 NOTE — Telephone Encounter (Signed)
Requesting:    diazepam Contract     2/29/2018 UDS   Low risk---next is due 11/14/2016 Last OV     10/16/2016   Last Refill     #90 with 1 refill on 06/14/2016  Please Advise

## 2016-10-02 NOTE — Telephone Encounter (Signed)
Faxed hardcopy for Valium to [facility] [place] 

## 2016-10-07 ENCOUNTER — Other Ambulatory Visit: Payer: Self-pay | Admitting: Family Medicine

## 2016-10-09 ENCOUNTER — Other Ambulatory Visit: Payer: Self-pay | Admitting: Family Medicine

## 2016-10-09 NOTE — Telephone Encounter (Signed)
Filling for Dr. Abner GreenspanBlyth   Requesting:Ambien Contract: Yes UDS: 2/29/18 low risk  Last OV: 09/18/16 Last Refill: 09/07/16 #[id number]  Please Advise

## 2016-10-09 NOTE — Telephone Encounter (Signed)
rx faxed   Pc 

## 2016-10-30 ENCOUNTER — Encounter: Payer: Self-pay | Admitting: Family

## 2016-10-30 ENCOUNTER — Telehealth: Payer: Self-pay | Admitting: Family Medicine

## 2016-10-30 ENCOUNTER — Ambulatory Visit (INDEPENDENT_AMBULATORY_CARE_PROVIDER_SITE_OTHER): Payer: BLUE CROSS/BLUE SHIELD | Admitting: Family

## 2016-10-30 VITALS — BP 118/74 | HR 66 | Temp 98.5°F | Resp 16 | Ht 73.0 in | Wt 182.0 lb

## 2016-10-30 DIAGNOSIS — E860 Dehydration: Secondary | ICD-10-CM | POA: Insufficient documentation

## 2016-10-30 DIAGNOSIS — H6983 Other specified disorders of Eustachian tube, bilateral: Secondary | ICD-10-CM

## 2016-10-30 MED ORDER — PREDNISONE 20 MG PO TABS: 20.0000 mg | ORAL_TABLET | Freq: Two times a day (BID) | ORAL | 0 refills | Status: DC | PRN

## 2016-10-30 NOTE — Progress Notes (Signed)
Subjective:    Patient ID: Daniel [name], male    DOB: [birth year], 61 y.o.   MRN: [id number]  Chief Complaint  Patient presents with  . Dizziness    headaches, dizziness, loss of balance since friday morning    HPI:  [name] is a 61 y.o. male who  has a past medical history of Anxiety; BPH (benign prostatic hyperplasia) (08/22/2011); Colon polyps; COPD (chronic obstructive pulmonary disease) (HCC); Depression; Diabetes (HCC); H/O gastric bypass (04/27/2016); Hemorrhoids (05/22/2015); Hyperlipidemia; Hypertension; Obesity; Osteoarthritis (01/13/2013); Osteoarthritis; Peripheral edema (04/27/2016); Sleep apnea; Status post biliopancreatic diversion with duodenal switch (12/19/2015); Tongue burning sensation (09/18/2016); and Zinc deficiency (04/27/2016). and presents today for an acute office visit.  This is a new problem. Associated symptoms of headaches, dizziness and loss of balance have been going on for about 4-5 days. Headaches are constant and described as dull and are generalized. There is occasional dizziness that occurs when he stands up from a sitting position or occurs when standing back up from leaning over. Will occasionally stumble. Described as a light headed feel. Drinks a lot of decaf tea throughout the day. No congestion or fevers. Modifying factors include aspirin which does help the headache. No changes in vision or blurred vision. Severity of the headaches are moderate. This is a new type of headache.  Allergies  Allergen Reactions  . No Known Allergies       Outpatient Medications Prior to Visit  Medication Sig Dispense Refill  . buPROPion (WELLBUTRIN XL) 150 MG 24 hr tablet TAKE 1 TABLET THREE TIMES A DAY (Patient taking differently: Take 450mg s daily in the morning) 90 tablet 2  . Calcium Carb-Cholecalciferol (CALCIUM PLUS VITAMIN D3) 600-500 MG-UNIT CAPS Take 1 tablet by mouth daily.    . Cholecalciferol (VITAMIN D3) 1000 units CAPS Take 1,000 Units by mouth daily.      . clobetasol (TEMOVATE) 0.05 % external solution USE TO SCALP AS DIRECTED (Patient taking differently: Apply 1 application topically daily as needed (itching). USE TO SCALP AS DIRECTED) 50 mL 6  . diazepam (VALIUM) 5 MG tablet TAKE 2 TABLETS EVERY 8 HOURS AS NEEDED FOR ANXIETY 90 tablet 1  . DULoxetine (CYMBALTA) 30 MG capsule TAKE 3 CAPSULES (90 MG TOTAL) BY MOUTH DAILY. 90 capsule 5  . finasteride (PROPECIA) 1 MG tablet TAKE 1 TABLET (1 MG TOTAL) BY MOUTH DAILY. 30 tablet 7  . lisinopril (PRINIVIL,ZESTRIL) 2.5 MG tablet TAKE 1 TABLET (2.5 MG TOTAL) BY MOUTH DAILY. 30 tablet 5  . Multiple Vitamins-Minerals (CENTRUM SILVER) tablet Take 2 tablets by mouth daily.    . nebivolol (BYSTOLIC) 5 MG tablet Take 1 tablet (5 mg total) by mouth 2 (two) times daily. 60 tablet 4  . Omega-3 300 MG CAPS Take 300 mg by mouth daily.    . sildenafil (VIAGRA) 100 MG tablet Take 0.5-1 tablets (50-100 mg total) by mouth daily as needed for erectile dysfunction. 10 tablet 2  . tamsulosin (FLOMAX) 0.4 MG CAPS capsule Take 1 capsule (0.4 mg total) by mouth daily. (Patient taking differently: Take 0.4 mg by mouth daily as needed (Urinary retention). ) 30 capsule 5  . VITAMIN A PO Take 2,400 Units by mouth daily.    . Vitamin D, Ergocalciferol, (DRISDOL) 50000 units CAPS capsule Take 1 capsule (50,000 Units total) by mouth 2 (two) times a week. (Patient taking differently: Take 50,000 Units by mouth 2 (two) times a week. Tuesdays and Thursdays) 8 capsule 4  . vitamin E 200 UNIT  capsule Take 200 Units by mouth daily.    Marland Kitchen zinc gluconate 50 MG tablet Take 100 mg by mouth daily.    Marland Kitchen zolpidem (AMBIEN) 10 MG tablet TAKE 1 TABLET BY MOUTH AT BEDTIME 30 tablet 0   No facility-administered medications prior to visit.       Past Surgical History:  Procedure Laterality Date  . ANAL FISTULOTOMY N/A 08/07/2016   Procedure: ANAL FISTULOTOMY;  Surgeon: Abigail Miyamoto, MD;  Location: [facility] OR;  Service: General;  Laterality: N/A;   . BARIATRIC SURGERY  2015   duodenal switch done at [facility]  . collapsed lung  1983, 1988, 1992  . HEMORRHOID SURGERY N/A 08/07/2016   Procedure: HEMORRHOIDECTOMY;  Surgeon: Abigail Miyamoto, MD;  Location: [facility] OR;  Service: General;  Laterality: N/A;  . IRRIGATION AND DEBRIDEMENT SEBACEOUS CYST     back      Past Medical History:  Diagnosis Date  . Anxiety   . BPH (benign prostatic hyperplasia) 08/22/2011  . Colon polyps   . COPD (chronic obstructive pulmonary disease) (HCC)   . Depression   . Diabetes (HCC)    no meds  . H/O gastric bypass 04/27/2016   Duodenal switch at [facility] Jul 30, 2013  . Hemorrhoids 05/22/2015  . Hyperlipidemia   . Hypertension   . Obesity   . Osteoarthritis 01/13/2013  . Osteoarthritis    left knee, treating with injections in [facility]  . Peripheral edema 04/27/2016  . Sleep apnea    Gone per pt  . 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 Tongue burning sensation 09/18/2016  . Zinc deficiency 04/27/2016      Review of Systems  Constitutional: Negative for chills and fever.  HENT: Negative for congestion and sore throat.   Respiratory: Negative for cough, chest tightness and shortness of breath.   Cardiovascular: Negative for chest pain, palpitations and leg swelling.  Neurological: Positive for light-headedness and headaches. Negative for dizziness, tremors, seizures, syncope, speech difficulty, weakness and numbness.      Objective:    BP 118/74 (BP Location: Left Arm, Patient Position: Sitting, Cuff Size: Large)   Pulse 66   Temp 98.5 F (36.9 C) (Oral)   Resp 16   Ht 6\' 1"  (1.854 m)   Wt 182 lb (82.6 kg)   SpO2 97%   BMI 24.01 kg/m  Nursing note and vital signs reviewed.  Physical Exam  Constitutional: He is oriented to person, place, and time. He appears well-developed and well-nourished. No distress.  HENT:  Right Ear: Hearing, tympanic membrane, external ear and ear canal normal.  Left Ear:  Hearing, tympanic membrane, external ear and ear canal normal.  Nose: Nose normal.  Mouth/Throat: Uvula is midline, oropharynx is clear and moist and mucous membranes are normal.  Eyes: Pupils are equal, round, and reactive to light. Conjunctivae and EOM are normal.  Neck: Normal range of motion. Neck supple.  Cardiovascular: Normal rate, regular rhythm, normal heart sounds and intact distal pulses.  Exam reveals no gallop and no friction rub.   No murmur heard. Pulmonary/Chest: Effort normal and breath sounds normal.  Lymphadenopathy:    He has no cervical adenopathy.  Neurological: He is alert and oriented to person, place, and time. No cranial nerve deficit.  Skin: Skin is warm and dry.  Psychiatric: He has a normal mood and affect. His behavior is normal. Judgment and thought content normal.       Assessment & Plan:   Problem List Items  Addressed This Visit      Nervous and Auditory   Dysfunction of both eustachian tubes    Symptoms of headaches likely related to combination of dehydration and dysfunction of both eustachian tubes as he does of fluid noted behind each years. Start prednisone. Encouraged over-the-counter medications as needed for symptom relief and supportive care. Follow-up if symptoms worsen or do not improve.        Other   Dehydration - Primary    New onset dizziness with orthostasis likely related to dehydration. Encouraged nonalcoholic/non-caffeinated fluids. Change position slowly. Follow-up if symptoms worsen or do not improve.          I am having Mr. Sapia start on predniSONE. I am also having him maintain his clobetasol, CENTRUM SILVER, sildenafil, tamsulosin, DULoxetine, Vitamin D (Ergocalciferol), lisinopril, buPROPion, finasteride, Omega-3, zinc gluconate, Vitamin D3, VITAMIN A PO, vitamin E, Calcium Carb-Cholecalciferol, nebivolol, diazepam, and zolpidem.   Meds ordered this encounter  Medications  . predniSONE (DELTASONE) 20 MG tablet     Sig: Take 1 tablet (20 mg total) by mouth 2 (two) times daily as needed.    Dispense:  10 tablet    Refill:  0    Order Specific Question:   Supervising Provider    Answer:   Hillard Danker A [[id number]]     Follow-up: Return if symptoms worsen or fail to improve.  Jeanine Luz, FNP

## 2016-10-30 NOTE — Telephone Encounter (Signed)
Pt called in to schedule an apt. Pt is experiencing some dizziness and he said he feels off balance. Transferred call to team health.

## 2016-10-30 NOTE — Telephone Encounter (Signed)
 [facility] [place] [facility] - Client TELEPHONE ADVICE RECORD [facility]  Patient Name: Tresa MooreROBERT Escoto  DOB: 04/03/1955    Initial Comment caller states, office call- pt. is having dizziness and loss of balance- headaches since friday morning.    Nurse Assessment  Nurse: Odis LusterBowers, RN, Bjorn Loserhonda Date/Time (Eastern Time): 10/30/2016 9:28:24 AM  Confirm and document reason for call. If symptomatic, describe symptoms. ---caller states, office call- pt. is having dizziness and loss of balance- headaches since Friday morning. Denies a spinning sensation, but states that he has come close to passing out. Reports that the night prior to the onset of symptoms, he took a Viagra 100mg .  Does the patient have any new or worsening symptoms? ---Yes  Will a triage be completed? ---Yes  Related visit to physician within the last 2 weeks? ---No  Does the PT have any chronic conditions? (i.e. diabetes, asthma, etc.) ---Yes  List chronic conditions. ---htn, anxiety/depression; Vit D deficient; insomnia  Is this a behavioral health or substance abuse call? ---No     Guidelines    Guideline Title Affirmed Question Affirmed Notes  Headache [1] SEVERE headache AND [2] sudden-onset (i.e., reaching maximum intensity within seconds)    Final Disposition User   Go to ED Now (or PCP triage) Odis LusterBowers, RN, Bjorn Loserhonda    Comments  No avail appts with Dr. Abner GreenspanBlyth at the [place] office today, Scheduled caller with Marcos EkeGreg Calone for this morning at 10:45am at the [place] office.   Referrals  REFERRED TO PCP OFFICE   Disagree/Comply: Comply

## 2016-10-30 NOTE — Patient Instructions (Addendum)
Thank you for choosing [facility].  SUMMARY AND INSTRUCTIONS:  Your symptoms are consistent with dehydration  Recommend copious amounts of water over the next several days.   Start the prednisone twice daily as needed.  If your symptoms worsen or do not improve - please follow up  Medication:  Your prescription(s) have been submitted to your pharmacy or been printed and provided for you. Please take as directed and contact our office if you believe you are having problem(s) with the medication(s) or have any questions.  Follow up:  If your symptoms worsen or fail to improve, please contact our office for further instruction, or in case of emergency go directly to the emergency room at the closest medical facility.

## 2016-10-30 NOTE — Assessment & Plan Note (Signed)
Symptoms of headaches likely related to combination of dehydration and dysfunction of both eustachian tubes as he does of fluid noted behind each years. Start prednisone. Encouraged over-the-counter medications as needed for symptom relief and supportive care. Follow-up if symptoms worsen or do not improve.

## 2016-10-30 NOTE — Assessment & Plan Note (Signed)
New onset dizziness with orthostasis likely related to dehydration. Encouraged nonalcoholic/non-caffeinated fluids. Change position slowly. Follow-up if symptoms worsen or do not improve.

## 2016-11-12 ENCOUNTER — Other Ambulatory Visit: Payer: Self-pay | Admitting: Pediatrics

## 2016-11-12 MED ORDER — ZOLPIDEM TARTRATE 10 MG PO TABS: 10.0000 mg | ORAL_TABLET | Freq: Every day | ORAL | 0 refills | Status: DC

## 2016-11-12 NOTE — Telephone Encounter (Signed)
Last CPE 04/27/2016, labs 09/18/2016, last filled #30 w/ 0 refills. Please advise

## 2016-11-12 NOTE — Telephone Encounter (Signed)
Your patient 

## 2016-11-12 NOTE — Telephone Encounter (Signed)
I have printed the refill but with the new regulations I need to see him every 6 months to keep this prescription going so he will need to be seen for more

## 2016-11-12 NOTE — Telephone Encounter (Signed)
[facility]  

## 2016-11-20 ENCOUNTER — Other Ambulatory Visit: Payer: Self-pay | Admitting: Family Medicine

## 2016-11-20 DIAGNOSIS — E559 Vitamin D deficiency, unspecified: Secondary | ICD-10-CM

## 2016-12-17 ENCOUNTER — Other Ambulatory Visit: Payer: Self-pay | Admitting: Family Medicine

## 2016-12-17 NOTE — Telephone Encounter (Signed)
Requesting:Ambien Contract:Yes UDS:low risk nxt scrn 11/14/16 Last OV:09/18/16 Next OV:not scheduled  Last Refill:11/12/16  #[id number]   Please advise

## 2016-12-18 NOTE — Telephone Encounter (Signed)
rx faxed

## 2016-12-21 DIAGNOSIS — L720 Epidermal cyst: Secondary | ICD-10-CM | POA: Diagnosis not present

## 2016-12-21 DIAGNOSIS — L281 Prurigo nodularis: Secondary | ICD-10-CM | POA: Diagnosis not present

## 2016-12-24 ENCOUNTER — Other Ambulatory Visit: Payer: Self-pay | Admitting: Family Medicine

## 2016-12-24 DIAGNOSIS — Z0000 Encounter for general adult medical examination without abnormal findings: Secondary | ICD-10-CM

## 2016-12-24 NOTE — Telephone Encounter (Signed)
Was advised to F/U in 2 months/asked pharm to inform pt to sched appt first/thx dmf

## 2016-12-31 ENCOUNTER — Ambulatory Visit (INDEPENDENT_AMBULATORY_CARE_PROVIDER_SITE_OTHER)
Admission: RE | Admit: 2016-12-31 | Discharge: 2016-12-31 | Disposition: A | Discharge status: Home / Self Care | Payer: BLUE CROSS/BLUE SHIELD | Source: Ambulatory Visit | Attending: Acute Care | Admitting: Acute Care

## 2016-12-31 DIAGNOSIS — F17210 Nicotine dependence, cigarettes, uncomplicated: Secondary | ICD-10-CM

## 2016-12-31 DIAGNOSIS — Z87891 Personal history of nicotine dependence: Secondary | ICD-10-CM | POA: Diagnosis not present

## 2017-01-01 ENCOUNTER — Telehealth: Payer: Self-pay | Admitting: Family Medicine

## 2017-01-01 NOTE — Telephone Encounter (Signed)
Patient Name: Daniel [name] DOB: [birth year] Initial Comment Caller states he fell last night, ribs are sore and has gash on forehead, has continued bleeding overnight and this morning Nurse Assessment Nurse: Yetta Barre, RN, Miranda Date/Time (Eastern Time): 01/01/2017 12:39:40 PM Confirm and document reason for call. If symptomatic, describe symptoms. ---Caller states he fell last night and hit his head on concrete. He has an abrasion on his forehead that is 1-2". Caller denies a laceration with 2 sides that could be stitched. He is also having pain in his ribs and headache. Does the patient have any new or worsening symptoms? ---Yes Will a triage be completed? ---Yes Related visit to physician within the last 2 weeks? ---No Does the PT have any chronic conditions? (i.e. diabetes, asthma, etc.) ---Yes List chronic conditions. ---BPH, HTN, Chronic pain, Anxiety Is this a behavioral health or substance abuse call? ---No Guidelines Guideline Title Affirmed Question Affirmed Notes Chest Injury [1] High-risk adult (e.g., age > 44, osteoporosis, chronic steroid use) AND [2] still hurts Head Injury ALSO, superficial cut (scratch) or abrasion (scrape) is present Final Disposition User See Physician within 24 Hours Jones, RN, Miranda Comments Pt states he already has an appt set up for tomorrow morning. Offered an earlier appt for today at [facility] and caller declined. Referrals REFERRED TO PCP OFFICE Caller Disagree/Comply Comply Caller Understands Yes PreDisposition Call Doctor

## 2017-01-02 ENCOUNTER — Other Ambulatory Visit: Payer: Self-pay | Admitting: Acute Care

## 2017-01-02 ENCOUNTER — Ambulatory Visit (INDEPENDENT_AMBULATORY_CARE_PROVIDER_SITE_OTHER): Payer: BLUE CROSS/BLUE SHIELD | Admitting: Family Medicine

## 2017-01-02 ENCOUNTER — Encounter: Payer: Self-pay | Admitting: Family Medicine

## 2017-01-02 VITALS — BP 108/62 | HR 69 | Temp 98.5°F | Ht 73.0 in | Wt 190.1 lb

## 2017-01-02 DIAGNOSIS — Z23 Encounter for immunization: Secondary | ICD-10-CM | POA: Diagnosis not present

## 2017-01-02 DIAGNOSIS — W19XXXA Unspecified fall, initial encounter: Secondary | ICD-10-CM

## 2017-01-02 DIAGNOSIS — F17210 Nicotine dependence, cigarettes, uncomplicated: Secondary | ICD-10-CM

## 2017-01-02 DIAGNOSIS — T148XXA Other injury of unspecified body region, initial encounter: Secondary | ICD-10-CM | POA: Diagnosis not present

## 2017-01-02 DIAGNOSIS — S20212A Contusion of left front wall of thorax, initial encounter: Secondary | ICD-10-CM | POA: Diagnosis not present

## 2017-01-02 DIAGNOSIS — Z122 Encounter for screening for malignant neoplasm of respiratory organs: Secondary | ICD-10-CM

## 2017-01-02 MED ORDER — DIAZEPAM 5 MG PO TABS: ORAL_TABLET | ORAL | 1 refills | Status: DC

## 2017-01-02 MED ORDER — CLOBETASOL PROPIONATE 0.05 % EX SOLN: 1.0000 "application " | Freq: Every day | CUTANEOUS | 1 refills | Status: DC | PRN

## 2017-01-02 NOTE — Progress Notes (Signed)
Chief Complaint  Patient presents with  . Fall   Daniel MaduroRobert is a 61 year old white male who is presenting for follow-up after a fall. 2 days ago, he was leaving a restaurant and slipped on the wet concrete. He landed on his left side and hit his head on the left. He had an excruciating headache initially that is now resolved. He also had a wound to his skin. He stated that it bled for 24 hours after it happened. Is doing well now. No drainage. He is also having pain over his left torso. This is also slightly improved, however hurts with movement and coughing. He still able to take deep breaths. No bruising or swelling. When he fell, he did not lose consciousness. He did not go to the emergency department. He is acting his normal self and denies any vision changes, difficulty swallowing, trouble with speech, numbness, tingling, weakness, or balance issues.  ROS:  Skin: +wound Neuro: As noted in HPI  Past Medical History:  Diagnosis Date  . Anxiety   . BPH (benign prostatic hyperplasia) 08/22/2011  . Colon polyps   . COPD (chronic obstructive pulmonary disease) (HCC)   . Depression   . Diabetes (HCC)    no meds  . 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 Hemorrhoids 05/22/2015  . Hyperlipidemia   . Hypertension   . Obesity   . Osteoarthritis 01/13/2013  . Osteoarthritis    left knee, [facility]  . Peripheral edema 04/27/2016  . Sleep apnea    Gone per pt  . 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 Tongue burning sensation 09/18/2016  . Zinc deficiency 04/27/2016   Family History  Problem Relation Age of Onset  . Cancer Mother        ovarian  . Hyperlipidemia Father   . Hypertension Father   . Heart disease Father        MI 7/19  . Emphysema Father   . Pulmonary disease Father   . Heart disease Maternal Grandmother   . Heart disease Maternal Grandfather   . Stroke Maternal Grandfather   .  Heart disease Paternal Grandmother   . Angina Paternal Grandmother   . Heart disease Paternal Grandfather   . Stroke Paternal Grandfather   . Diabetes Maternal Uncle   . Kidney disease Maternal Uncle   . Diabetes Paternal Uncle   . Cancer Paternal Uncle    Social History   Social History  . Marital status: Single   Occupational History  . accountant    Social History Main Topics  . Smoking status: Current Every Day Smoker    Packs/day: 1.00    Years: 40.00    Types: Cigarettes    Last attempt to quit: 01/08/2013  . Smokeless tobacco: Never Used     Comment: 3-4 cigarettes daily.   Patient started back smoking. smokes one pack per day.  . Alcohol use No     Comment: once a month, glass of wine  . Drug use: No   Past Surgical History:  Procedure Laterality Date  . ANAL FISTULOTOMY N/A 08/07/2016   Procedure: ANAL FISTULOTOMY;  Surgeon: Abigail MiyamotoBlackman, Douglas, MD;  [facility];  Service: General;  Laterality: N/A;  . BARIATRIC SURGERY  2015   duodenal switch done at [facility]  . collapsed lung  1983, 1988, 1992  . HEMORRHOID SURGERY N/A 08/07/2016   Procedure: HEMORRHOIDECTOMY;  Surgeon: Abigail MiyamotoBlackman, Douglas, MD;  Location: [facility] OR;  Service: General;  Laterality: N/A;  . IRRIGATION AND DEBRIDEMENT SEBACEOUS CYST     back   BP 108/62 (BP Location: Left Arm, Patient Position: Sitting, Cuff Size: Normal)   Pulse 69   Temp 98.5 F (36.9 C) (Oral)   Ht 6\' 1"  (1.854 m)   Wt 190 lb 2 oz (86.2 kg)   SpO2 96%   BMI 25.08 kg/m  Gen- Awake, alert, appearing stated age Eyes- PERRLA, EOMi Mouth- MMM Neuro- DTR's equal and symmetric, no clonus, no cerebellar signs, speech is fluent and goal oriented Msk- 5/5 strength throughout, gait normal; mild TTP over L rib cage, no crepitus, deformity, ecchymosis, or edema Heart- RRR Lungs- CTAB, no access muscle use Skin- see pic below, mild TTP, no excessive warmth or fluctuance Psych- age appropriate judgment and insight  Media Information      Document Information   Photos    01/02/2017 07:43  Attached To:  Office Visit on 01/02/17 with Sharlene Dory, DO  Source Information   Sharlene Dory, DO  [facility]     Fall, initial encounter  Skin abrasion  Contusion of rib on left side, initial encounter  TAO BID for 7-10 days on skin, warning signs verbalized and written down. Ice, Tylenol, activity modification for rib contusion. I discussed with him that I do not think imaging of his head is warranted at this time given the timeframe. If anything sinister was going on, it would likely would have already presented. F/u prn. Pt voiced understanding and agreement to the plan.  Jilda Roche [place], DO 8:10 AM 01/02/17

## 2017-01-02 NOTE — Progress Notes (Signed)
Pre visit review using our clinic review tool, if applicable. No additional management support is needed unless otherwise documented below in the visit note. 

## 2017-01-02 NOTE — Patient Instructions (Addendum)
Apply triple antibiotic ointment (like Neosporin) to area on forehead twice daily for 7-10 days to prevent infection.  Things to look out for: increasing pain not relieved by ibuprofen/acetaminophen, fevers, spreading redness, drainage of pus, or foul odor.  Ice is your friend. Ice/cold pack over area for 10-15 min every 2-3 hours while awake.  OK to take Tylenol 1000 mg (2 extra strength tabs) or 975 mg (3 regular strength tabs) every 6 hours as needed.  If you start having numbness, tingling, unilateral weakness, trouble speaking, difficulty swallowing, or vision changes, seek care.   Follow up with Dr. Abner GreenspanBlyth regarding any changes with your Bystolic.

## 2017-01-08 DIAGNOSIS — L281 Prurigo nodularis: Secondary | ICD-10-CM | POA: Diagnosis not present

## 2017-01-09 ENCOUNTER — Other Ambulatory Visit: Payer: Self-pay | Admitting: Family Medicine

## 2017-01-09 DIAGNOSIS — Z0000 Encounter for general adult medical examination without abnormal findings: Secondary | ICD-10-CM

## 2017-01-10 ENCOUNTER — Encounter: Payer: Self-pay | Admitting: Family Medicine

## 2017-01-10 ENCOUNTER — Ambulatory Visit (INDEPENDENT_AMBULATORY_CARE_PROVIDER_SITE_OTHER): Payer: BLUE CROSS/BLUE SHIELD | Admitting: Family Medicine

## 2017-01-10 VITALS — BP 108/68 | HR 67 | Temp 98.5°F | Ht 73.0 in | Wt 183.1 lb

## 2017-01-10 DIAGNOSIS — R519 Headache, unspecified: Secondary | ICD-10-CM

## 2017-01-10 DIAGNOSIS — R51 Headache: Secondary | ICD-10-CM | POA: Diagnosis not present

## 2017-01-10 MED ORDER — PREDNISONE 5 MG (21) PO TBPK: ORAL_TABLET | ORAL | 0 refills | Status: DC

## 2017-01-10 MED ORDER — KETOROLAC TROMETHAMINE 60 MG/2ML IM SOLN
60.0000 mg | Freq: Once | INTRAMUSCULAR | Status: AC
  Administered 2017-01-10: 60 mg via INTRAMUSCULAR

## 2017-01-10 NOTE — Progress Notes (Signed)
Chief Complaint  Patient presents with  . Headache    Daniel [name] is a 61 y.o. male here for evaluation of bilateral headache.  Duration: 10 days Palliation: holding head still, ASA a little Provocation: shaking head side to side Severity: 6/10 now, 10/10 when he shakes head Quality: dull. Pounding when he shakes his head side to side Associated symptoms: none Denies: Gum chewing, jaw pain, injury since his fall, nausea, vomiting, photophobia, vertigo, neck stiffness, dysphasia, dysphagia Currently with headache? Yes Failed therapies: ASA  ROS:  Neuro: +HA  Past Medical History:  Diagnosis Date  . Anxiety   . BPH (benign prostatic hyperplasia) 08/22/2011  . Colon polyps   . COPD (chronic obstructive pulmonary disease) (HCC)   . Depression   . Diabetes (HCC)    no meds  . 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 Hemorrhoids 05/22/2015  . Hyperlipidemia   . Hypertension   . Obesity   . Osteoarthritis 01/13/2013  . Osteoarthritis    left knee, [facility]  . Peripheral edema 04/27/2016  . Sleep apnea    Gone per pt  . 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 Tongue burning sensation 09/18/2016  . Zinc deficiency 04/27/2016      BP 108/68 (BP Location: Left Arm, Patient Position: Sitting, Cuff Size: Normal)   Pulse 67   Temp 98.5 F (36.9 C) (Oral)   Ht 6\' 1"  (1.854 m)   Wt 183 lb 2 oz (83.1 kg)   SpO2 97%   BMI 24.16 kg/m  General: awake, alert, appearing stated age Eyes: PERRLA, EOMi Heart: RRR, no murmurs, no bruits Lungs: CTAB, no accessory muscle use Neuro: CN 2-12 intact, no cerebellar signs, DTR's equal and symmetry, no clonus MSK: 5/5 strength throughout, normal gait, no TTP over posterior cervical triangle or paraspinal cervical musculature; TTP over temporalis b/l Psych: Age appropriate judgment and insight, mood and affect normal  Acute nonintractable headache,  unspecified headache type - Plan: predniSONE (STERAPRED UNI-PAK 21 TAB) 5 MG (21) TBPK tablet  Orders as above. Likely a concussion 2/2 impact with concrete. Rest. Heat. Toradol today, pred taper for home for inflammation. Warning s/s's written and verbalized.  Follow up prn; if still having symptoms at week 3 that are not improving, will refer to Neuro. The patient voiced understanding and agreement to the plan.  Jilda Rocheicholas Paul CeloronWendling, [place]DO 10:39 AM 01/10/17

## 2017-01-10 NOTE — Patient Instructions (Addendum)
If this is not getting better after around 3 weeks, let me know. I think getting you into a neurologist at that point would be reasonable.  Heat may be helpful.   Call us if you are not improving or if things worsen.   Things to look out for: trouble with speech, vision changes, difficulty swallowing, weakness, numbness, or tingling.

## 2017-01-10 NOTE — Progress Notes (Signed)
Pre visit review using our clinic review tool, if applicable. No additional management support is needed unless otherwise documented below in the visit note. 

## 2017-01-10 NOTE — Addendum Note (Signed)
Addended by: Lurline HareARTER, Preet Mangano E on: 01/10/2017 10:43 AM   Modules accepted: Orders

## 2017-01-16 DIAGNOSIS — L720 Epidermal cyst: Secondary | ICD-10-CM | POA: Diagnosis not present

## 2017-01-16 DIAGNOSIS — D485 Neoplasm of uncertain behavior of skin: Secondary | ICD-10-CM | POA: Diagnosis not present

## 2017-02-09 ENCOUNTER — Other Ambulatory Visit: Payer: Self-pay | Admitting: Family Medicine

## 2017-02-11 NOTE — Telephone Encounter (Signed)
Requesting: VIAGRA 100MG   Last Refill: 05/12/2015 LastOV: 01/10/2017  Please advise

## 2017-02-15 ENCOUNTER — Telehealth: Payer: Self-pay | Admitting: Family Medicine

## 2017-02-15 MED ORDER — NEBIVOLOL HCL 5 MG PO TABS
5.0000 mg | ORAL_TABLET | Freq: Two times a day (BID) | ORAL | 2 refills | Status: DC
Start: 2017-02-15 — End: 2017-05-14

## 2017-02-15 NOTE — Telephone Encounter (Signed)
Pt notified that medication had been refilled. Pt [facility] [address].

## 2017-02-15 NOTE — Telephone Encounter (Signed)
Copied from CRM [id number]. Topic: Inquiry >> Feb 15, 2017  9:51 AM [name], Daniel B wrote: Reason for CRM: pt called to have is BYSTOLIC Rx refilled, pt has not contacted pharmacy, contact pt if needed

## 2017-02-22 ENCOUNTER — Encounter: Payer: Self-pay | Admitting: Family Medicine

## 2017-02-22 ENCOUNTER — Ambulatory Visit: Payer: BLUE CROSS/BLUE SHIELD | Admitting: Family Medicine

## 2017-02-22 VITALS — BP 122/80 | HR 63 | Temp 98.5°F | Ht 73.0 in | Wt 184.0 lb

## 2017-02-22 DIAGNOSIS — R1900 Intra-abdominal and pelvic swelling, mass and lump, unspecified site: Secondary | ICD-10-CM

## 2017-02-22 DIAGNOSIS — R0789 Other chest pain: Secondary | ICD-10-CM

## 2017-02-22 NOTE — Progress Notes (Signed)
Chief Complaint  Patient presents with  . Hernia    Subjective: Patient is a 61 y.o. male here for possible hernia.  An intermittent bulge in his left lower quadrant over the past 3-4 months.  He does not currently feel it.  He notices it more when he bends over.  It has started to become uncomfortable.  There are no skin changes, fevers, or bowel changes.  He also has been having intermittent left-sided chest pain whenever he coughs.  This is been going on for around 1 month.  He denies any shortness of breath, fevers, coughing up blood, or frequent coughing.  He has not been sick recently and brings denies any injury.  He has not been doing anything at home for this.  ROS: Heart: + patient ischest pain  Lungs: Denies SOB   Family History  Problem Relation Age of Onset  . Cancer Mother        ovarian  . Hyperlipidemia Father   . Hypertension Father   . Heart disease Father        MI 7/19  . Emphysema Father   . Pulmonary disease Father   . Heart disease Maternal Grandmother   . Heart disease Maternal Grandfather   . Stroke Maternal Grandfather   . Heart disease Paternal Grandmother   . Angina Paternal Grandmother   . Heart disease Paternal Grandfather   . Stroke Paternal Grandfather   . Diabetes Maternal Uncle   . Kidney disease Maternal Uncle   . Diabetes Paternal Uncle   . Cancer Paternal Uncle    Past Medical History:  Diagnosis Date  . Anxiety   . BPH (benign prostatic hyperplasia) 08/22/2011  . Colon polyps   . COPD (chronic obstructive pulmonary disease) (HCC)   . Depression   . Diabetes (HCC)    no meds  . H/O gastric bypass 04/27/2016   [facility], 2013  . Hemorrhoids 05/22/2015  . Hyperlipidemia   . Hypertension   . Obesity   . Osteoarthritis 01/13/2013  . Osteoarthritis    left [facility], [facility]  . Peripheral edema 04/27/2016  . Sleep apnea    Gone per pt  . 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 Tongue burning sensation 09/18/2016  . Zinc deficiency 04/27/2016   Allergies  Allergen Reactions  . No Known Allergies     Current Outpatient Medications:  .  buPROPion (WELLBUTRIN XL) 150 MG 24 hr tablet, TAKE 1 TABLET BY MOUTH THREE TIMES A DAY, Disp: 90 tablet, Rfl: 2 .  Calcium Carb-Cholecalciferol (CALCIUM PLUS VITAMIN D3) 600-500 MG-UNIT CAPS, Take 1 tablet by mouth daily., Disp: , Rfl:  .  Cholecalciferol (VITAMIN D3) 1000 units CAPS, Take 1,000 Units by mouth daily., Disp: , Rfl:  .  clobetasol (TEMOVATE) 0.05 % external solution, Apply 1 application topically daily as needed (itching). USE TO SCALP AS DIRECTED, Disp: 50 mL, Rfl: 1 .  diazepam (VALIUM) 5 MG tablet, TAKE 2 TABLETS EVERY 8 HOURS AS NEEDED FOR ANXIETY, Disp: 90 tablet, Rfl: 1 .  DULoxetine (CYMBALTA) 30 MG capsule, TAKE 3 CAPSULES EVERY DAY, Disp: 90 capsule, Rfl: 5 .  finasteride (PROPECIA) 1 MG tablet, TAKE 1 TABLET (1 MG TOTAL) BY MOUTH DAILY., Disp: 30 tablet, Rfl: 7 .  lisinopril (PRINIVIL,ZESTRIL) 2.5 MG tablet, TAKE 1 TABLET (2.5 MG TOTAL) BY MOUTH DAILY., Disp: 30 tablet, Rfl: 5 .  Multiple Vitamins-Minerals (CENTRUM SILVER) tablet, Take 2 tablets  by mouth daily., Disp: , Rfl:  .  nebivolol (BYSTOLIC) 5 MG tablet, Take 1 tablet (5 mg total) by mouth 2 (two) times daily., Disp: 60 tablet, Rfl: 2 .  Omega-3 300 MG CAPS, Take 300 mg by mouth daily., Disp: , Rfl:  .  VIAGRA 100 MG tablet, TAKE ONE-HALF TO 1 TABLET BY MOUTH DAILY AS NEEDED FOR ERECTILE DYSFUNCTION, Disp: 10 tablet, Rfl: 1 .  VITAMIN A PO, Take 2,400 Units by mouth daily., Disp: , Rfl:  .  Vitamin D, Ergocalciferol, (DRISDOL) 50000 units CAPS capsule, TAKE 1 CAPSULE (50,000 UNITS TOTAL) BY MOUTH 2 (TWO) TIMES A WEEK., Disp: 8 capsule, Rfl: 4 .  vitamin E 200 UNIT capsule, Take 200 Units by mouth daily., Disp: , Rfl:  .  zinc gluconate 50 MG tablet, Take 100 mg by mouth daily., Disp: , Rfl:  .  zolpidem (AMBIEN) 10 MG tablet,  TAKE 1 TABLET AT BEDTIME, Disp: 30 tablet, Rfl: 3  Objective: BP 122/80 (BP Location: Left Arm, Patient Position: Sitting, Cuff Size: Normal)   Pulse 63   Temp 98.5 F (36.9 C) (Oral)   Ht 6\' 1"  (1.854 m)   Wt 184 lb (83.5 kg)   SpO2 (!) 6%   BMI 24.28 kg/m  General: Awake, appears stated age HEENT: MMM, EOMi Heart: RRR Lungs: CTAB, no rales, wheezes or rhonchi. No accessory muscle use MSK: No TTP over chest wall or TTP with LUE movement Abd: BS+, soft, NT, ND, no masses or organomegaly, I did not appreciate any bulges Psych: Age appropriate judgment and insight, normal affect and mood  Assessment and Plan: Abdominal wall bulge - Plan: US Abdomen Limited  Chest wall pain  Orders as above. Will discuss referral to [facility] pending results. Ibuprofen, heat, ice, stretch area, likely costochondritis. F/u prn.  The patient voiced understanding and agreement to the plan.  Jilda Rocheicholas Paul CleatonWendling, DO 02/22/17  3:59 PM

## 2017-02-22 NOTE — Progress Notes (Signed)
Pre visit review using our clinic review tool, if applicable. No additional management support is needed unless otherwise documented below in the visit note. 

## 2017-02-22 NOTE — Patient Instructions (Addendum)
If you do not hear anything about your referral in the next week or so, call our office and ask for an update.  Ibuprofen 400-600 mg (2-3 over the counter strength tabs) every 6 hours as needed for pain.  OK to take Tylenol 1000 mg (2 extra strength tabs) or 975 mg (3 regular strength tabs) every 6 hours as needed.  Stretch the area 3 times per week once per day. Hold for 30 seconds and repeat 2 times.   Heat (pad or rice pillow in microwave) over affected area, 10-15 minutes every 2-3 hours while awake.   Let us know if you need anything.

## 2017-03-05 ENCOUNTER — Ambulatory Visit (HOSPITAL_BASED_OUTPATIENT_CLINIC_OR_DEPARTMENT_OTHER)
Admission: RE | Admit: 2017-03-05 | Discharge: 2017-03-05 | Disposition: A | Discharge status: Home / Self Care | Payer: BLUE CROSS/BLUE SHIELD | Source: Ambulatory Visit | Attending: Family Medicine | Admitting: Family Medicine

## 2017-03-05 DIAGNOSIS — R1904 Left lower quadrant abdominal swelling, mass and lump: Secondary | ICD-10-CM | POA: Diagnosis not present

## 2017-03-05 DIAGNOSIS — R1900 Intra-abdominal and pelvic swelling, mass and lump, unspecified site: Secondary | ICD-10-CM | POA: Diagnosis not present

## 2017-03-06 ENCOUNTER — Other Ambulatory Visit: Payer: Self-pay | Admitting: Family Medicine

## 2017-03-06 ENCOUNTER — Telehealth: Payer: Self-pay

## 2017-03-06 DIAGNOSIS — K439 Ventral hernia without obstruction or gangrene: Secondary | ICD-10-CM

## 2017-03-08 ENCOUNTER — Other Ambulatory Visit: Payer: Self-pay | Admitting: Surgery

## 2017-03-08 DIAGNOSIS — K4090 Unilateral inguinal hernia, without obstruction or gangrene, not specified as recurrent: Secondary | ICD-10-CM | POA: Diagnosis not present

## 2017-03-08 DIAGNOSIS — K603 Anal fistula: Secondary | ICD-10-CM | POA: Diagnosis not present

## 2017-04-01 ENCOUNTER — Other Ambulatory Visit: Payer: Self-pay | Admitting: Family Medicine

## 2017-04-03 ENCOUNTER — Encounter (HOSPITAL_BASED_OUTPATIENT_CLINIC_OR_DEPARTMENT_OTHER): Payer: Self-pay | Admitting: *Deleted

## 2017-04-03 ENCOUNTER — Other Ambulatory Visit: Payer: Self-pay

## 2017-04-08 ENCOUNTER — Encounter: Payer: Self-pay | Admitting: Podiatry

## 2017-04-08 ENCOUNTER — Ambulatory Visit: Payer: BLUE CROSS/BLUE SHIELD | Admitting: Podiatry

## 2017-04-08 ENCOUNTER — Other Ambulatory Visit: Payer: Self-pay | Admitting: Podiatry

## 2017-04-08 ENCOUNTER — Ambulatory Visit (INDEPENDENT_AMBULATORY_CARE_PROVIDER_SITE_OTHER): Payer: BLUE CROSS/BLUE SHIELD

## 2017-04-08 VITALS — BP 138/85 | HR 64 | Ht 73.0 in | Wt 182.0 lb

## 2017-04-08 DIAGNOSIS — M898X7 Other specified disorders of bone, ankle and foot: Secondary | ICD-10-CM

## 2017-04-08 DIAGNOSIS — M79672 Pain in left foot: Secondary | ICD-10-CM

## 2017-04-08 NOTE — Progress Notes (Signed)
Subjective:    Patient ID: Daniel [name], male    DOB: June 06, 1955, 62 y.o.   MRN: [id number]  HPI  Chief Complaint  Patient presents with  . Foot Pain    (NP) red knot on the top of the left foot- hurts when any pressure is applied- just appeared- extreme pain       Review of Systems  All other systems reviewed and are negative.      Objective:   Physical Exam        Assessment & Plan:

## 2017-04-08 NOTE — Patient Instructions (Signed)
Pre-Operative Instructions  Congratulations, you have decided to take an important step towards improving your quality of life.  You can be assured that the doctors and staff at [facility] will be with you every step of the way.  Here are some important things you should know:  1. Plan to be at the surgery center/hospital at least 1 (one) hour prior to your scheduled time, unless otherwise directed by the surgical center/hospital staff.  You must have a responsible adult accompany you, remain during the surgery and drive you home.  Make sure you have directions to the surgical center/hospital to ensure you arrive on time. 2. If you are having surgery at [facility] or [facility] hospitals, you will need a copy of your medical history and physical form from your family physician within one month prior to the date of surgery. We will give you a form for your primary physician to complete.  3. We make every effort to accommodate the date you request for surgery.  However, there are times where surgery dates or times have to be moved.  We will contact you as soon as possible if a change in schedule is required.   4. No aspirin/ibuprofen for one week before surgery.  If you are on aspirin, any non-steroidal anti-inflammatory medications (Mobic, Aleve, Ibuprofen) should not be taken seven (7) days prior to your surgery.  You make take Tylenol for pain prior to surgery.  5. Medications - If you are taking daily heart and blood pressure medications, seizure, reflux, allergy, asthma, anxiety, pain or diabetes medications, make sure you notify the surgery center/hospital before the day of surgery so they can tell you which medications you should take or avoid the day of surgery. 6. No food or drink after midnight the night before surgery unless directed otherwise by surgical center/hospital staff. 7. No alcoholic beverages 24-hours prior to surgery.  No smoking 24-hours prior or 24-hours after  surgery. 8. Wear loose pants or shorts. They should be loose enough to fit over bandages, boots, and casts. 9. Don't wear slip-on shoes. Sneakers are preferred. 10. Bring your boot with you to the surgery center/hospital.  Also bring crutches or a walker if your physician has prescribed it for you.  If you do not have this equipment, it will be provided for you after surgery. 11. If you have not been contacted by the surgery center/hospital by the day before your surgery, call to confirm the date and time of your surgery. 12. Leave-time from work may vary depending on the type of surgery you have.  Appropriate arrangements should be made prior to surgery with your employer. 13. Prescriptions will be provided immediately following surgery by your doctor.  Fill these as soon as possible after surgery and take the medication as directed. Pain medications will not be refilled on weekends and must be approved by the doctor. 14. Remove nail polish on the operative foot and avoid getting pedicures prior to surgery. 15. Wash the night before surgery.  The night before surgery wash the foot and leg well with water and the antibacterial soap provided. Be sure to pay special attention to beneath the toenails and in between the toes.  Wash for at least three (3) minutes. Rinse thoroughly with water and dry well with a towel.  Perform this wash unless told not to do so by your physician.  Enclosed: 1 Ice pack (please put in freezer the night before surgery)   1 Hibiclens skin cleaner     Pre-op instructions  If you have any questions regarding the instructions, please do not hesitate to call our office.  [place]: [address], [place], [place] [address] -- [phone]  [place]: [address] [place], [place] [address] -- [phone]  [place]: [address]  [place], [place] [address] -- [phone]  [place]: [address], [address], [place], [place] [address] -- [phone]  Website:  [email or web address] 

## 2017-04-09 NOTE — H&P (Signed)
Daniel [name]  [facility] Patient #: [id number] DOB: 04/28/1955 Single / Language: [place] / Race: White Male   History of Present Illness The patient is a 62 year old male who presents with an inguinal hernia. This is a patient of mine who is here today for evaluation of an inguinal hernia. He reports noticing a bulge several weeks ago in the left groin. He saw his primary care physician. An ultrasound was performed showing a probable left inguinal hernia. He reports that the bulge will easily reduced and that he has minimal discomfort. He has had no obstructive symptoms. 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 He reports discomfort and possible recurrence of the fistula site.  Other Problems  Anxiety Disorder  Arthritis  Chest pain  Chronic Obstructive Lung Disease  Diabetes Mellitus  Enlarged Prostate  Hemorrhoids  High blood pressure  Hypercholesterolemia  Sleep Apnea   Past Surgical History   Gastric Bypass  Hemorrhoidectomy  Lung Surgery  Right. Oral Surgery  Allergies  No Known Drug Allergies  No Known Allergies Allergies Reconciled   Medication History  Analpram HC (2.5-1% Cream, 1 (one) Application Rectal two times daily, Taken starting 08/21/2016) Active. Wellbutrin SR (150MG  Tablet ER 12HR, Oral) Active. Clobetasol Propionate (0.05% Solution, External) Active. DiazePAM (5MG  Tablet, Oral) Active. DULoxetine HCl (30MG  Capsule DR Part, Oral) Active. Lisinopril (2.5MG  Tablet, Oral) Active. Bystolic (5MG  Tablet, Oral) Active. Vitamin D (Ergocalciferol) (50000UNIT Capsule, Oral) Active. Zolpidem Tartrate (10MG  Tablet, Oral) Active. Vitamin E (200UNIT Tablet, Oral) Active. Zinc Gluconate (50MG  Tablet, Oral) Active. Fish Oil (300MG  Capsule, Oral) Active. Cymbalta (Oral) Specific strength unknown - Active. Finasteride (1MG  Tablet, Oral) Active. Medications  Reconciled  Vitals  Weight: 184 lb Height: 73in Body Surface Area: 2.08 m Body Mass Index: 24.28 kg/m  Temp.: 98.18F  Pulse: 85 (Regular)  BP: 124/76 (Sitting, Left Arm, Standard)       Physical Exam   The physical exam findings are as follows: Note:On examination, he has an easily reducible left inguinal hernia. The right inguinal floor feels weak but I cannot feel a definite hernia. Lungs clear CV RRR Abdomen soft, nt There is tenderness at the fistulotomy site on his perianal area. I did express a small amount of purulence    Assessment & Plan   LEFT INGUINAL HERNIA (K40.90)  Impression: I discussed the diagnosis of hernia with him in detail. He is eager to proceed with repair with mesh. We discussed both the laparoscopic and open techniques. I would favor a laparoscopic inguinal hernia with mesh so I can evaluate the right side and repair that as well as I suspect he may be developing a hernia. I discussed the surgical procedure in detail. We discussed the risks of surgery. These risks include but are not limited to bleeding, infection, hernia recurrence, nerve entrapment, chronic pain, injury to surrounding structures, postoperative recovery, etc. 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 Because of the complex nature of his perianal issues, I would like him to see one of our colorectal specialists here in the office for further recommendations. 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

## 2017-04-10 ENCOUNTER — Ambulatory Visit (HOSPITAL_BASED_OUTPATIENT_CLINIC_OR_DEPARTMENT_OTHER): Payer: BLUE CROSS/BLUE SHIELD | Admitting: Anesthesiology

## 2017-04-10 ENCOUNTER — Other Ambulatory Visit: Payer: Self-pay

## 2017-04-10 ENCOUNTER — Telehealth: Payer: Self-pay

## 2017-04-10 ENCOUNTER — Ambulatory Visit (HOSPITAL_BASED_OUTPATIENT_CLINIC_OR_DEPARTMENT_OTHER)
Admission: RE | Admit: 2017-04-10 | Discharge: 2017-04-10 | Disposition: A | Discharge status: Home / Self Care | Payer: BLUE CROSS/BLUE SHIELD | Source: Ambulatory Visit | Attending: Surgery | Admitting: Surgery

## 2017-04-10 ENCOUNTER — Telehealth: Payer: Self-pay | Admitting: *Deleted

## 2017-04-10 ENCOUNTER — Encounter (HOSPITAL_BASED_OUTPATIENT_CLINIC_OR_DEPARTMENT_OTHER): Payer: Self-pay | Admitting: Anesthesiology

## 2017-04-10 ENCOUNTER — Encounter (HOSPITAL_BASED_OUTPATIENT_CLINIC_OR_DEPARTMENT_OTHER)
Admission: RE | Disposition: A | Discharge status: Home / Self Care | Payer: Self-pay | Source: Ambulatory Visit | Attending: Surgery

## 2017-04-10 DIAGNOSIS — Z7984 Long term (current) use of oral hypoglycemic drugs: Secondary | ICD-10-CM | POA: Insufficient documentation

## 2017-04-10 DIAGNOSIS — E119 Type 2 diabetes mellitus without complications: Secondary | ICD-10-CM | POA: Diagnosis not present

## 2017-04-10 DIAGNOSIS — K4090 Unilateral inguinal hernia, without obstruction or gangrene, not specified as recurrent: Secondary | ICD-10-CM | POA: Insufficient documentation

## 2017-04-10 DIAGNOSIS — F419 Anxiety disorder, unspecified: Secondary | ICD-10-CM | POA: Insufficient documentation

## 2017-04-10 DIAGNOSIS — E7800 Pure hypercholesterolemia, unspecified: Secondary | ICD-10-CM | POA: Diagnosis not present

## 2017-04-10 DIAGNOSIS — J449 Chronic obstructive pulmonary disease, unspecified: Secondary | ICD-10-CM | POA: Insufficient documentation

## 2017-04-10 DIAGNOSIS — Z79899 Other long term (current) drug therapy: Secondary | ICD-10-CM | POA: Insufficient documentation

## 2017-04-10 DIAGNOSIS — N400 Enlarged prostate without lower urinary tract symptoms: Secondary | ICD-10-CM | POA: Diagnosis not present

## 2017-04-10 DIAGNOSIS — E785 Hyperlipidemia, unspecified: Secondary | ICD-10-CM | POA: Diagnosis not present

## 2017-04-10 DIAGNOSIS — I10 Essential (primary) hypertension: Secondary | ICD-10-CM | POA: Diagnosis not present

## 2017-04-10 HISTORY — PX: INGUINAL HERNIA REPAIR: SHX194

## 2017-04-10 HISTORY — PX: INSERTION OF MESH: SHX5868

## 2017-04-10 SURGERY — REPAIR, HERNIA, INGUINAL, LAPAROSCOPIC
Anesthesia: General | Site: Abdomen | Laterality: Left

## 2017-04-10 MED ORDER — SCOPOLAMINE 1 MG/3DAYS TD PT72: 1.0000 | MEDICATED_PATCH | Freq: Once | TRANSDERMAL | Status: DC | PRN

## 2017-04-10 MED ORDER — ROCURONIUM BROMIDE 10 MG/ML (PF) SYRINGE
PREFILLED_SYRINGE | INTRAVENOUS | Status: AC
  Filled 2017-04-10: qty 5

## 2017-04-10 MED ORDER — FENTANYL CITRATE (PF) 100 MCG/2ML IJ SOLN
INTRAMUSCULAR | Status: AC
  Filled 2017-04-10: qty 2

## 2017-04-10 MED ORDER — OXYCODONE HCL 5 MG PO TABS
ORAL_TABLET | ORAL | Status: AC
  Filled 2017-04-10: qty 1

## 2017-04-10 MED ORDER — MIDAZOLAM HCL 2 MG/2ML IJ SOLN
INTRAMUSCULAR | Status: AC
  Filled 2017-04-10: qty 2

## 2017-04-10 MED ORDER — BUPIVACAINE-EPINEPHRINE (PF) 0.5% -1:200000 IJ SOLN
INTRAMUSCULAR | Status: DC | PRN
  Administered 2017-04-10: 20 mL

## 2017-04-10 MED ORDER — FENTANYL CITRATE (PF) 100 MCG/2ML IJ SOLN
50.0000 ug | INTRAMUSCULAR | Status: DC | PRN
  Administered 2017-04-10: 100 ug via INTRAVENOUS

## 2017-04-10 MED ORDER — SUGAMMADEX SODIUM 200 MG/2ML IV SOLN
INTRAVENOUS | Status: AC
  Filled 2017-04-10: qty 2

## 2017-04-10 MED ORDER — PROMETHAZINE HCL 25 MG/ML IJ SOLN: 6.2500 mg | INTRAMUSCULAR | Status: DC | PRN

## 2017-04-10 MED ORDER — MEPERIDINE HCL 25 MG/ML IJ SOLN: 6.2500 mg | INTRAMUSCULAR | Status: DC | PRN

## 2017-04-10 MED ORDER — ONDANSETRON HCL 4 MG/2ML IJ SOLN
INTRAMUSCULAR | Status: AC
  Filled 2017-04-10: qty 2

## 2017-04-10 MED ORDER — DEXAMETHASONE SODIUM PHOSPHATE 4 MG/ML IJ SOLN
INTRAMUSCULAR | Status: DC | PRN
  Administered 2017-04-10: 10 mg via INTRAVENOUS

## 2017-04-10 MED ORDER — EPINEPHRINE 30 MG/30ML IJ SOLN
INTRAMUSCULAR | Status: AC
  Filled 2017-04-10: qty 1

## 2017-04-10 MED ORDER — LACTATED RINGERS IV SOLN
INTRAVENOUS | Status: DC
  Administered 2017-04-10 (×2): via INTRAVENOUS

## 2017-04-10 MED ORDER — PROPOFOL 10 MG/ML IV BOLUS
INTRAVENOUS | Status: DC | PRN
  Administered 2017-04-10: 150 mg via INTRAVENOUS

## 2017-04-10 MED ORDER — ONDANSETRON HCL 4 MG/2ML IJ SOLN
INTRAMUSCULAR | Status: DC | PRN
  Administered 2017-04-10: 4 mg via INTRAVENOUS

## 2017-04-10 MED ORDER — DEXAMETHASONE SODIUM PHOSPHATE 10 MG/ML IJ SOLN
INTRAMUSCULAR | Status: AC
Start: 2017-04-10 — End: ?
  Filled 2017-04-10: qty 1

## 2017-04-10 MED ORDER — CEFAZOLIN SODIUM-DEXTROSE 2-4 GM/100ML-% IV SOLN
INTRAVENOUS | Status: AC
  Filled 2017-04-10: qty 100

## 2017-04-10 MED ORDER — CHLORHEXIDINE GLUCONATE CLOTH 2 % EX PADS: 6.0000 | MEDICATED_PAD | Freq: Once | CUTANEOUS | Status: DC

## 2017-04-10 MED ORDER — HYDROMORPHONE HCL 4 MG PO TABS: 2.0000 mg | ORAL_TABLET | Freq: Four times a day (QID) | ORAL | 0 refills | Status: DC | PRN

## 2017-04-10 MED ORDER — FENTANYL CITRATE (PF) 100 MCG/2ML IJ SOLN
25.0000 ug | INTRAMUSCULAR | Status: DC | PRN
  Administered 2017-04-10: 50 ug via INTRAVENOUS

## 2017-04-10 MED ORDER — PROPOFOL 500 MG/50ML IV EMUL
INTRAVENOUS | Status: AC
  Filled 2017-04-10: qty 50

## 2017-04-10 MED ORDER — BUPIVACAINE HCL (PF) 0.5 % IJ SOLN
INTRAMUSCULAR | Status: AC
  Filled 2017-04-10: qty 30

## 2017-04-10 MED ORDER — OXYCODONE HCL 5 MG/5ML PO SOLN: 5.0000 mg | Freq: Once | ORAL | Status: AC | PRN

## 2017-04-10 MED ORDER — CEFAZOLIN SODIUM-DEXTROSE 2-4 GM/100ML-% IV SOLN
2.0000 g | INTRAVENOUS | Status: AC
  Administered 2017-04-10: 2 g via INTRAVENOUS

## 2017-04-10 MED ORDER — SUGAMMADEX SODIUM 200 MG/2ML IV SOLN
INTRAVENOUS | Status: DC | PRN
  Administered 2017-04-10: 200 mg via INTRAVENOUS

## 2017-04-10 MED ORDER — OXYCODONE HCL 5 MG PO TABS
5.0000 mg | ORAL_TABLET | Freq: Once | ORAL | Status: AC | PRN
  Administered 2017-04-10: 5 mg via ORAL

## 2017-04-10 MED ORDER — MIDAZOLAM HCL 2 MG/2ML IJ SOLN
1.0000 mg | INTRAMUSCULAR | Status: DC | PRN
  Administered 2017-04-10: 2 mg via INTRAVENOUS

## 2017-04-10 MED ORDER — ROCURONIUM BROMIDE 100 MG/10ML IV SOLN
INTRAVENOUS | Status: DC | PRN
  Administered 2017-04-10: 50 mg via INTRAVENOUS

## 2017-04-10 MED ORDER — LIDOCAINE 2% (20 MG/ML) 5 ML SYRINGE
INTRAMUSCULAR | Status: AC
  Filled 2017-04-10: qty 5

## 2017-04-10 MED ORDER — BUPIVACAINE HCL (PF) 0.25 % IJ SOLN
INTRAMUSCULAR | Status: AC
  Filled 2017-04-10: qty 60

## 2017-04-10 MED ORDER — BUPIVACAINE-EPINEPHRINE (PF) 0.5% -1:200000 IJ SOLN
INTRAMUSCULAR | Status: AC
  Filled 2017-04-10: qty 60

## 2017-04-10 MED ORDER — EPHEDRINE SULFATE 50 MG/ML IJ SOLN
INTRAMUSCULAR | Status: DC | PRN
  Administered 2017-04-10 (×2): 10 mg via INTRAVENOUS

## 2017-04-10 SURGICAL SUPPLY — 33 items
ADH SKN CLS APL DERMABOND .7 (GAUZE/BANDAGES/DRESSINGS) ×2
APPLIER CLIP LOGIC TI 5 (MISCELLANEOUS) IMPLANT
APR CLP MED LRG 33X5 (MISCELLANEOUS)
BLADE CLIPPER SURG (BLADE) ×1 IMPLANT
CHLORAPREP W/TINT 26ML (MISCELLANEOUS) ×2 IMPLANT
DERMABOND ADVANCED (GAUZE/BANDAGES/DRESSINGS) ×2
DERMABOND ADVANCED .7 DNX12 (GAUZE/BANDAGES/DRESSINGS) ×2 IMPLANT
DEVICE SECURE STRAP 25 ABSORB (INSTRUMENTS) ×2 IMPLANT
DISSECT BALLN SPACEMKR + OVL (BALLOONS) ×2
DISSECTOR BALLN SPACEMKR + OVL (BALLOONS) ×1 IMPLANT
DISSECTOR BLUNT TIP ENDO 5MM (MISCELLANEOUS) IMPLANT
ELECT REM PT RETURN 9FT ADLT (ELECTROSURGICAL) ×2
ELECTRODE REM PT RTRN 9FT ADLT (ELECTROSURGICAL) ×1 IMPLANT
GLOVE BIO SURGEON STRL SZ 6.5 (GLOVE) ×1 IMPLANT
GLOVE SURG SIGNA 7.5 PF LTX (GLOVE) ×2 IMPLANT
GOWN STRL REUS W/ TWL LRG LVL3 (GOWN DISPOSABLE) ×1 IMPLANT
GOWN STRL REUS W/ TWL XL LVL3 (GOWN DISPOSABLE) ×1 IMPLANT
GOWN STRL REUS W/TWL LRG LVL3 (GOWN DISPOSABLE) ×2
GOWN STRL REUS W/TWL XL LVL3 (GOWN DISPOSABLE) ×2
MESH 3DMAX 4X6 LT LRG (Mesh General) ×1 IMPLANT
NDL INSUFFLATION 14GA 120MM (NEEDLE) IMPLANT
NEEDLE INSUFFLATION 14GA 120MM (NEEDLE) IMPLANT
PACK BASIN DAY SURGERY FS (CUSTOM PROCEDURE TRAY) ×2 IMPLANT
SCISSORS LAP 5X35 DISP (ENDOMECHANICALS) IMPLANT
SET IRRIG TUBING LAPAROSCOPIC (IRRIGATION / IRRIGATOR) IMPLANT
SET TROCAR LAP APPLE-HUNT 5MM (ENDOMECHANICALS) ×2 IMPLANT
SLEEVE SCD COMPRESS KNEE MED (MISCELLANEOUS) ×2 IMPLANT
SUT MNCRL AB 4-0 PS2 18 (SUTURE) ×2 IMPLANT
TOWEL OR 17X24 6PK STRL BLUE (TOWEL DISPOSABLE) ×2 IMPLANT
TRAY FOLEY BAG SILVER LF 14FR (SET/KITS/TRAYS/PACK) IMPLANT
TRAY FOLEY BAG SILVER LF 16FR (SET/KITS/TRAYS/PACK) IMPLANT
TRAY LAPAROSCOPIC (CUSTOM PROCEDURE TRAY) ×2 IMPLANT
TUBING INSUFFLATION (TUBING) ×2 IMPLANT

## 2017-04-10 NOTE — Interval H&P Note (Signed)
History and Physical Interval Note: no change in H and P  04/10/2017 8:52 AM  Daniel [name]  has presented today for surgery, with the diagnosis of LEFT INGUINAL HERNIA  The various methods of treatment have been discussed with the patient and family. After consideration of risks, benefits and other options for treatment, the patient has consented to  Procedure(s): LAPAROSCOPIC LEFT POSSIBLE RIGHT INGUINAL HERNIA REPAIR WITH MESH (Left) INSERTION OF MESH (Left) as a surgical intervention .  The patient's history has been reviewed, patient examined, no change in status, stable for surgery.  I have reviewed the patient's chart and labs.  Questions were answered to the patient's satisfaction.     [name] A

## 2017-04-10 NOTE — Anesthesia Procedure Notes (Signed)
Procedure Name: Intubation Date/Time: 04/10/2017 9:25 AM Performed by: Gar GibbonKeeton, Chaunta Bejarano S, CRNA Pre-anesthesia Checklist: Patient identified, Emergency Drugs available, Suction available and Patient being monitored Patient Re-evaluated:Patient Re-evaluated prior to induction Oxygen Delivery Method: Circle system utilized Preoxygenation: Pre-oxygenation with 100% oxygen Induction Type: IV induction Ventilation: Mask ventilation without difficulty Laryngoscope Size: Miller and 3 Grade View: Grade II Tube type: Oral Tube size: 8.0 mm Number of attempts: 1 Airway Equipment and Method: Stylet and Oral airway Placement Confirmation: ETT inserted through vocal cords under direct vision,  positive ETCO2 and breath sounds checked- equal and bilateral Secured at: 23 cm Tube secured with: Tape Dental Injury: Teeth and Oropharynx as per pre-operative assessment

## 2017-04-10 NOTE — Telephone Encounter (Signed)
Copied from CRM [phone]. Topic: Appointment Scheduling - Scheduling Inquiry for Clinic >> Apr 10, 2017  4:42 PM Daniel [name], Daniel [name] wrote: Reason for CRM: pt wanted to have a message sent to see if Dr. Carmelia RollerWendling could give the pt a cpe before his pcp is available on Feb 12th [name]/c pt is trying to get a cpe before the Feb 21st for surgery, pt was told that cpe's need to be done by pcp but insisted on having a message sent regarding this, contact pt to advise

## 2017-04-10 NOTE — Telephone Encounter (Signed)
"  I'm calling about [facility].  I called [employer] to see if they were in network.  They said they were not.  They tried to call and the numbers we googled were disconnected.  My [facility] had [facility] by you all and [facility] not [facility] network.  She had to pay a lot of money out of pocket.  I don't want that to happen to me.  If [facility], they will cover everything at 100%.  As a matter of fact I think I would rather have it there to be safe."  If you [facility], they require you to have a history and physical form completed by your primary care doctor.  "I just had surgery there this week and they didn't require it."  I don't know why you didn't have to have it done but it is required.  "What do I need to tell them when I schedule my appointment?"  There's a history and physical form that you can come by the office to pick up.  "Can you email the form?  Do I just bring it to your office once the doctor completes it?"  Yes, I can email you and there's a fax number on the cover letter that it needs to be sent to.  "Once I get my appointment, I will let you know."  I attempted to call [facility], they had closed for the day.

## 2017-04-10 NOTE — Telephone Encounter (Signed)
I had a message to call if we could get you scheduled for surgery sooner that February 28.  Dr. Logan BoresEvans can do your surgery on February 21.  "That date will be fine, put me down for then.  Question, is [facility] in network with [employer]?"  You will have to call the surgical center to get that information.  Do you have their phone number?  "Yes, I do."

## 2017-04-10 NOTE — Progress Notes (Signed)
   HPI: 62 year old male presenting today as a new patient with a chief complaint of a nodule to the dorsum of the left foot that appeared yesterday. He reports associated severe pain and erythema of the area. He states the nodule is very hard and feels like bone. He is unable to wear regular shoes due to the pain. He has not done anything for treatment. Patient is here for further evaluation and treatment.   Past Medical History:  Diagnosis Date  . Anxiety   . BPH (benign prostatic hyperplasia) 08/22/2011  . Colon polyps   . COPD (chronic obstructive pulmonary disease) ([place])   . Depression   . H/O gastric bypass 04/27/2016   Duodenal switch at [facility] Jul 30, 2013  . Hemorrhoids 05/22/2015  . Hyperlipidemia   . Hypertension   . Obesity   . Osteoarthritis 01/13/2013  . Osteoarthritis    left knee, treating with injections in [facility]  . Peripheral edema 04/27/2016  . Sleep apnea    lost weight  . 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 Tongue burning sensation 09/18/2016  . Zinc deficiency 04/27/2016     Physical Exam: General: The patient is alert and oriented x3 in no acute distress.  Dermatology: Skin is warm, dry and supple bilateral lower extremities. Negative for open lesions or macerations.  Vascular: Palpable pedal pulses bilaterally. No edema or erythema noted. Capillary refill within normal limits.  Neurological: Epicritic and protective threshold grossly intact bilaterally.   Musculoskeletal Exam: Palpable dorsal bone spur to 1st met-cuneiform joint with pain on palpation. Range of motion within normal limits to all pedal and ankle joints bilateral. Muscle strength 5/5 in all groups bilateral.   Radiographic Exam:  Dorsal spurring noted to the 1st met-cuneiform joint of the left foot.     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 X-Rays reviewed.  - Today we discussed the conservative versus  surgical management of the presenting pathology. The patient opts for surgical management. All possible complications and details of the procedure were explained. All patient questions were answered. No guarantees were expressed or implied. - Authorization for surgery was initiated today. Surgery will consist of exostectomy first metatarsal of the left foot. Exostectomy of medial cuneiform of left foot. - Post op shoe dispensed. - Return to clinic one week post op.   Edrick Kins, DPM [facility]  Dr. Edrick Kins, DPM    [address], [place] [address]                Office [phone]  Fax [phone]

## 2017-04-10 NOTE — H&P (View-Only) (Signed)
   HPI: 62 year old male presenting today as a new patient with a chief complaint of a nodule to the dorsum of the left foot that appeared yesterday. He reports associated severe pain and erythema of the area. He states the nodule is very hard and feels like bone. He is unable to wear regular shoes due to the pain. He has not done anything for treatment. Patient is here for further evaluation and treatment.   Past Medical History:  Diagnosis Date  . Anxiety   . BPH (benign prostatic hyperplasia) 08/22/2011  . Colon polyps   . COPD (chronic obstructive pulmonary disease) ([place])   . Depression   . H/O gastric bypass 04/27/2016   Duodenal switch at [facility] Jul 30, 2013  . Hemorrhoids 05/22/2015  . Hyperlipidemia   . Hypertension   . Obesity   . Osteoarthritis 01/13/2013  . Osteoarthritis    left knee, treating with injections in [facility]  . Peripheral edema 04/27/2016  . Sleep apnea    lost weight  . 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 Tongue burning sensation 09/18/2016  . Zinc deficiency 04/27/2016     Physical Exam: General: The patient is alert and oriented x3 in no acute distress.  Dermatology: Skin is warm, dry and supple bilateral lower extremities. Negative for open lesions or macerations.  Vascular: Palpable pedal pulses bilaterally. No edema or erythema noted. Capillary refill within normal limits.  Neurological: Epicritic and protective threshold grossly intact bilaterally.   Musculoskeletal Exam: Palpable dorsal bone spur to 1st met-cuneiform joint with pain on palpation. Range of motion within normal limits to all pedal and ankle joints bilateral. Muscle strength 5/5 in all groups bilateral.   Radiographic Exam:  Dorsal spurring noted to the 1st met-cuneiform joint of the left foot.     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 X-Rays reviewed.  - Today we discussed the conservative versus  surgical management of the presenting pathology. The patient opts for surgical management. All possible complications and details of the procedure were explained. All patient questions were answered. No guarantees were expressed or implied. - Authorization for surgery was initiated today. Surgery will consist of exostectomy first metatarsal of the left foot. Exostectomy of medial cuneiform of left foot. - Post op shoe dispensed. - Return to clinic one week post op.   Edrick Kins, DPM [facility]  Dr. Edrick Kins, DPM    [address], [place] [address]                Office [phone]  Fax [phone]

## 2017-04-10 NOTE — Telephone Encounter (Signed)
Please clarify if he needs a physical or a preop physical.  I am happy to do either with the blessing of Dr. Abner GreenspanBlyth.

## 2017-04-10 NOTE — Anesthesia Postprocedure Evaluation (Signed)
Anesthesia Post Note  Patient: Daniel ClearRobert M [name]  Procedure(s) Performed: LAPAROSCOPIC LEFT INGUINAL HERNIA REPAIR WITH MESH (Left Abdomen) INSERTION OF MESH (Left Abdomen)     Patient location during evaluation: PACU Anesthesia Type: General Level of consciousness: sedated Pain management: pain level controlled Vital Signs Assessment: post-procedure vital signs reviewed and stable Respiratory status: spontaneous breathing and respiratory function stable Cardiovascular status: stable Postop Assessment: no apparent nausea or vomiting Anesthetic complications: no    Last Vitals:  Vitals:   04/10/17 1022 04/10/17 1030  BP:  125/76  Pulse: 63 65  Resp: 14 14  Temp:    SpO2: 100% 98%    Last Pain:  Vitals:   04/10/17 1022  TempSrc:   PainSc: 6                  Tresea Heine DANIEL

## 2017-04-10 NOTE — Transfer of Care (Signed)
Immediate Anesthesia Transfer of Care Note  Patient: Daniel ClearRobert M [name]  Procedure(s) Performed: LAPAROSCOPIC LEFT INGUINAL HERNIA REPAIR WITH MESH (Left Abdomen) INSERTION OF MESH (Left Abdomen)  Patient Location: PACU  Anesthesia Type:General  Level of Consciousness: awake, sedated and patient cooperative  Airway & Oxygen Therapy: Patient Spontanous Breathing and Patient connected to face mask oxygen  Post-op Assessment: Report given to RN and Post -op Vital signs reviewed and stable  Post vital signs: Reviewed and stable  Last Vitals:  Vitals:   04/10/17 0905  Temp: 36.8 C    Last Pain:  Vitals:   04/10/17 0905  TempSrc: Oral         Complications: No apparent anesthesia complications

## 2017-04-10 NOTE — Anesthesia Preprocedure Evaluation (Signed)
Anesthesia Evaluation  Patient identified by MRN, date of birth, ID band Patient awake    Reviewed: Allergy & Precautions, NPO status , Patient's Chart, lab work & pertinent test results  Airway Mallampati: II  TM Distance: >3 FB Neck ROM: Full    Dental no notable dental hx.    Pulmonary sleep apnea , COPD, Current Smoker,    Pulmonary exam normal breath sounds clear to auscultation       Cardiovascular Exercise Tolerance: Good hypertension, Pt. on medications Normal cardiovascular exam Rhythm:Regular Rate:Normal  ECG: NSR, rate 61   Neuro/Psych Anxiety Depression negative neurological ROS     GI/Hepatic negative GI ROS, Neg liver ROS,   Endo/Other  diabetes, Type 2  Renal/GU negative Renal ROS  negative genitourinary   Musculoskeletal  (+) Arthritis ,   Abdominal   Peds negative pediatric ROS (+)  Hematology negative hematology ROS (+)   Anesthesia Other Findings   Reproductive/Obstetrics negative OB ROS                             Anesthesia Physical  Anesthesia Plan  ASA: III  Anesthesia Plan: General   Post-op Pain Management:    Induction: Intravenous  PONV Risk Score and Plan: 3 and Ondansetron, Dexamethasone, Midazolam and Treatment may vary due to age or medical condition  Airway Management Planned: Oral ETT  Additional Equipment:   Intra-op Plan:   Post-operative Plan: Extubation in OR  Informed Consent: I have reviewed the patients History and Physical, chart, labs and discussed the procedure including the risks, benefits and alternatives for the proposed anesthesia with the patient or authorized representative who has indicated his/her understanding and acceptance.   Dental advisory given  Plan Discussed with: CRNA  Anesthesia Plan Comments:         Anesthesia Quick Evaluation

## 2017-04-10 NOTE — Discharge Instructions (Signed)
[facility] _______[facility]  UMBILICAL OR INGUINAL HERNIA REPAIR: POST OP INSTRUCTIONS  Always review your discharge instruction sheet given to you by the facility where your surgery was performed. IF YOU HAVE DISABILITY OR FAMILY LEAVE FORMS, YOU MUST BRING THEM TO THE OFFICE FOR PROCESSING.   DO NOT GIVE THEM TO YOUR DOCTOR.  1. A  prescription for pain medication may be given to you upon discharge.  Take your pain medication as prescribed, if needed.  If narcotic pain medicine is not needed, then you may take acetaminophen (Tylenol) or ibuprofen (Advil) as needed. 2. Take your usually prescribed medications unless otherwise directed. If you need a refill on your pain medication, please contact your pharmacy.  They will contact our office to request authorization. Prescriptions will not be filled after 5 pm or on week-ends. 3. You should follow a light diet the first 24 hours after arrival home, such as soup and crackers, etc.  Be sure to include lots of fluids daily.  Resume your normal diet the day after surgery. 4.Most patients will experience some swelling and bruising around the umbilicus or in the groin and scrotum.  Ice packs and reclining will help.  Swelling and bruising can take several days to resolve.  6. It is common to experience some constipation if taking pain medication after surgery.  Increasing fluid intake and taking a stool softener (such as Colace) will usually help or prevent this problem from occurring.  A mild laxative (Milk of Magnesia or Miralax) should be taken according to package directions if there are no bowel movements after 48 hours. 7. Unless discharge instructions indicate otherwise, you may remove your bandages 24-48 hours after surgery, and you may shower at that time.  You may have steri-strips (small skin tapes) in place directly over the incision.  These strips should be left on the skin for 7-10 days.  If your surgeon used skin glue on the  incision, you may shower in 24 hours.  The glue will flake off over the next 2-3 weeks.  Any sutures or staples will be removed at the office during your follow-up visit. 8. ACTIVITIES:  You may resume regular (light) daily activities beginning the next day--such as daily self-care, walking, climbing stairs--gradually increasing activities as tolerated.  You may have sexual intercourse when it is comfortable.  Refrain from any heavy lifting or straining until approved by your doctor.  a.You may drive when you are no longer taking prescription pain medication, you can comfortably wear a seatbelt, and you can safely maneuver your car and apply brakes. b.RETURN TO WORK:   _____________________________________________  9.You should see your doctor in the office for a follow-up appointment approximately 2-3 weeks after your surgery.  Make sure that you call for this appointment within a day or two after you arrive home to insure a convenient appointment time. 10.OTHER INSTRUCTIONS: __NO LIFTING MORE THAN 15 POUNDS FOR 3 WEEKS Ok to shower starting tomorrow Ice pack, tylenol, ibuprofen also for pain _______________________    _____________________________________  WHEN TO CALL YOUR DOCTOR: 1. Fever over 101.0 2. Inability to urinate 3. Nausea and/or vomiting 4. Extreme swelling or bruising 5. Continued bleeding from incision. 6. Increased pain, redness, or drainage from the incision  The clinic staff is available to answer your questions during regular business hours.  Please dont hesitate to call and ask to speak to one of the nurses for clinical concerns.  If you have a medical emergency, go to the nearest  emergency room or call 911.  A surgeon from [facility] is always on call at the hospital   [address], [address], [place], [place]  [address] ?  [address] [address], [place], [place]   [address] [phone] ? [phone] ? FAX [phone] Web site:  [email or web address]    Post Anesthesia Home Care Instructions  Activity: Get plenty of rest for the remainder of the day. A responsible individual must stay with you for 24 hours following the procedure.  For the next 24 hours, DO NOT: -Drive a car -Advertising copywriter -Drink alcoholic beverages -Take any medication unless instructed by your physician -Make any legal decisions or sign important papers.  Meals: Start with liquid foods such as gelatin or soup. Progress to regular foods as tolerated. Avoid greasy, spicy, heavy foods. If nausea and/or vomiting occur, drink only clear liquids until the nausea and/or vomiting subsides. Call your physician if vomiting continues.  Special Instructions/Symptoms: Your throat may feel dry or sore from the anesthesia or the breathing tube placed in your throat during surgery. If this causes discomfort, gargle with warm salt water. The discomfort should disappear within 24 hours.  If you had a scopolamine patch placed behind your ear for the management of post- operative nausea and/or vomiting:  1. The medication in the patch is effective for 72 hours, after which it should be removed.  Wrap patch in a tissue and discard in the trash. Wash hands thoroughly with soap and water. 2. You may remove the patch earlier than 72 hours if you experience unpleasant side effects which may include dry mouth, dizziness or visual disturbances. 3. Avoid touching the patch. Wash your hands with soap and water after contact with the patch.

## 2017-04-10 NOTE — Op Note (Signed)
LAPAROSCOPIC LEFT INGUINAL HERNIA REPAIR WITH MESH, INSERTION OF MESH  Procedure Note  Audie ClearRobert M Ivanov 04/10/2017   Pre-op Diagnosis: LEFT INGUINAL HERNIA     Post-op Diagnosis: same  Procedure(s): LAPAROSCOPIC LEFT INGUINAL HERNIA REPAIR WITH MESH INSERTION OF MESH  Surgeon(s): Abigail MiyamotoBlackman, Graceann Boileau, MD  Anesthesia: General  Staff:  Circulator: Pablo LedgerAnderson, Leslie C, RN; Salley Scarletavidson, Mary B, RN Scrub Person: Wardell HeathLewis, Dawn M, CST  Estimated Blood Loss: Minimal  Findings: The patient was found to have an indirect left inguinal hernia.  There was no evidence of right inguinal hernia.  The left inguinal hernia was repaired with a large piece of Prolene 3 DMax mesh from [employer]  Procedure: With the patient was brought to the operating room and identified as the correct patient.  He was placed supine on the operating table and general anesthesia was induced.  His abdomen was then prepped and draped in the usual sterile fashion.  I made a small vertical incision just below the umbilicus with a scalpel.  I carried this down to the fascia which was opened just to the left of the midline.  The rectus muscle was then elevated.  The dissecting balloon was passed underneath the rectus muscle and manipulate it toward the pubis.  The dissecting balloon was then insufflated under direct vision dissecting out the preperitoneal space.  I then removed the dissecting balloon and insufflation was begun with carbon dioxide.  I placed two 5 mm trochars in the patient's lower midline both under direct vision.  The left inguinal area was dissected out first.  The patient had an obvious indirect left inguinal hernia without evidence of direct hernia.  I was able to easily reduce the hernia sac from the cord structures.  I then evaluate the right inguinal area and saw no evidence of direct or indirect hernia on the right.  At this point, a left-sided piece of Bard 3 DMax large Prolene mesh were brought to the field.  I placed  through the trocar at the umbilicus.  I then opened as an onlay on the left inguinal floor.  I tacked the mesh to Cooper's ligament, up the medial abdominal wall, and slightly laterally with the absorbable tacker.  Wide coverage of the inguinal floor and cord structures appear to be achieved.  Hemostasis also appeared to be achieved.  At this point the midline trochars were removed under direct vision and the preperitoneal space was deflated.  The mesh appeared to remain in the appropriate location.  I then removed the trocar at the umbilicus.  I opened the peritoneum and relieved some carbadox of that it leaked into the abdominal cavity.  I then closed the fascia with a figure-of-eight 0 Vicryl suture.  All incisions were anesthetized with Marcaine.  I performed a left ilioinguinal nerve block with Marcaine as well.  And closed all skin incisions with 4-0 Monocryl sutures.  Dermabond was then applied.  The patient tolerated procedure well.  All the counts were correct at the end of the procedure.  The patient was then extubated in the operating room and taken in a stable condition to the recovery room.          Dontell Mian A   Date: 04/10/2017  Time: 10:03 AM

## 2017-04-11 ENCOUNTER — Encounter (HOSPITAL_BASED_OUTPATIENT_CLINIC_OR_DEPARTMENT_OTHER): Payer: Self-pay | Admitting: Surgery

## 2017-04-11 ENCOUNTER — Telehealth: Payer: Self-pay | Admitting: *Deleted

## 2017-04-11 NOTE — Telephone Encounter (Signed)
Switch is fine with me. 

## 2017-04-11 NOTE — Telephone Encounter (Signed)
"  I haven't received the email with the forms for my surgery."  I was told that [facility] is in network with [employer].  "[facility] [place] [facility].  [employer] is not even aware of them.  I don't want to take the chance.  [facility]."  I will send the email sometime today.  "Okay, thank you."

## 2017-04-11 NOTE — Telephone Encounter (Signed)
Pt states that he needs a complete physical and have given the Pt forms to have filled out during the physical. Pt seems to be a bit confused as to which one he needs. Pt informed me that the forms are in an email and sent to him today. I asked Pt to bring in forms before that appointment and explained a turn around time on having forms filled out. Daniel [name] then asked for fax number to send the forms over now and it was given to him. Pt is asking that his PCP be switched from Daniel [name] to Daniel [name] stating that although he loves Daniel [name] it's extremely hard getting in to see her and having to wait "2 months or more to see a doctor just doesn't work for me". I told Pt that I would work on that for him. In the meantime Physical has been switched to a sooner date per Pt's request with Daniel [name], will update Pt about PCP switch once I speak with both providers.

## 2017-04-11 NOTE — Telephone Encounter (Signed)
I emailed the patient the history and physical form.

## 2017-04-11 NOTE — Telephone Encounter (Signed)
"  This is my third phone call in two days.  I still have not received the email from you with the documents.  The physical forms that you want my primary care doctor to fill out.  They've already called me twice asking for those forms.  I'd appreciate if you could take care of sending me that because I've been waiting on it.  This is in regards to my upcoming surgery with Dr. Logan BoresEvans.  I don't have a firm date.  I guess I'm waiting on you to give me that at [facility].  Please give me a call when you are sending me the email with the forms."  I emailed the forms at 3:02pm.  Patient replied and stated he received the forms.  He said he's scheduled to see his primary care doctor on 04/18/2017.

## 2017-04-11 NOTE — Telephone Encounter (Signed)
Switch made and Pt notified via phone call. States he still has not faxed over anything but says he will soon and will call to follow up once it's done.

## 2017-04-16 NOTE — Telephone Encounter (Signed)
I am calling to let you know we have you scheduled for surgery on February 7.  Your procedure starts at 2 pm.  Someone from the surgery center will call you with the arrival time.  "I go to my primary care doctor on Thursday to fill that form out. It's at the main hospital right?"  Yes it's the [facility].  "It's at the hospital where I was born.  Thank you so much."

## 2017-04-18 ENCOUNTER — Encounter: Payer: Self-pay | Admitting: Family Medicine

## 2017-04-18 ENCOUNTER — Ambulatory Visit (INDEPENDENT_AMBULATORY_CARE_PROVIDER_SITE_OTHER): Payer: BLUE CROSS/BLUE SHIELD | Admitting: Family Medicine

## 2017-04-18 VITALS — BP 124/84 | HR 68 | Temp 98.0°F | Ht 73.0 in | Wt 183.4 lb

## 2017-04-18 DIAGNOSIS — Z01818 Encounter for other preprocedural examination: Secondary | ICD-10-CM | POA: Diagnosis not present

## 2017-04-18 LAB — BASIC METABOLIC PANEL
BUN: 13 mg/dL (ref 6–23)
CALCIUM: 9.1 mg/dL (ref 8.4–10.5)
CHLORIDE: 104 meq/L (ref 96–112)
CO2: 31 meq/L (ref 19–32)
CREATININE: 0.75 mg/dL (ref 0.40–1.50)
GFR: 112.53 mL/min (ref >60.00)
Glucose, Bld: 97 mg/dL (ref 70–99)
Potassium: 4.2 mEq/L (ref 3.5–5.1)
SODIUM: 139 meq/L (ref 135–145)

## 2017-04-18 NOTE — Patient Instructions (Addendum)
Continue all your medicine through surgery.  Let us know if you need anything.

## 2017-04-18 NOTE — Progress Notes (Signed)
Pre visit review using our clinic review tool, if applicable. No additional management support is needed unless otherwise documented below in the visit note. 

## 2017-04-18 NOTE — Progress Notes (Addendum)
Subjective:   Chief Complaint  Patient presents with  . Pre-op Exam    Daniel [name]  is here for a Pre-operative physical at the request of Dr. Andrez Grime.   He  is having L foot surgery on 04/25/17 for Foot pain.  Personal or family hx of adverse outcome to anesthesia? No Chipped, cracked, missing, or loose teeth? No  Decreased ROM of neck? No  Able to walk up 2 flights of stairs without becoming significantly short of breath or having chest pain? Yes   Revised Goldman Criteria: High Risk Surgery (intraperitoneal, intrathoracic, aortic): No  Ischemic heart disease (Prior MI, +excercise stress test, angina, nitrate use, Qwave): No  History of heart failure: No  History of cerebrovascular disease: No  History of diabetes: No  Insulin therapy for DM: No  Preoperative Cr >2.0: No   Revised Goldman Criteria - risk for major cardiac death No risk factors - 0.4 percent One risk factor - 1.0 percent  Two risk factors - 2.4 percent  Three or more risk factors - 5.4 percent   Patient Active Problem List   Diagnosis Date Noted  . Dehydration 10/30/2016  . Dysfunction of both eustachian tubes 10/30/2016  . Tongue burning sensation 09/18/2016  . Left lower quadrant pain 06/29/2016  . Bloating 06/29/2016  . Rectal bleeding 06/29/2016  . Zinc deficiency 04/27/2016  . H/O gastric bypass 04/27/2016  . 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 Diarrhea 12/19/2015  . Hemorrhoids 05/22/2015  . Osteoarthritis 01/13/2013  . Unsteadiness 02/27/2012  . BPH (benign prostatic hyperplasia) 08/22/2011  . DM (diabetes mellitus) (HCC) 05/21/2011  . Vitamin D deficiency 05/21/2011  . Constipation 01/12/2011  . Scrotal cyst 12/18/2010  . Annual physical exam 09/17/2010  . Shoulder pain, left 07/16/2010  . Low back pain 07/16/2010  . INSOMNIA 03/16/2010  . OBSTRUCTIVE SLEEP APNEA 02/20/2010  . TOBACCO ABUSE 01/26/2010  . PERIPHERAL EDEMA 01/26/2010  . SNORING 01/26/2010   . Hypotestosteronemia 03/24/2009  . Hyperlipidemia 03/24/2009  . ANXIETY DEPRESSION 03/24/2009  . Essential hypertension 03/24/2009   Past Medical History:  Diagnosis Date  . Anxiety   . BPH (benign prostatic hyperplasia) 08/22/2011  . Colon polyps   . COPD (chronic obstructive pulmonary disease) (HCC)   . Depression   . H/O gastric bypass 04/27/2016   Duodenal switch at [facility] Jul 30, 2013  . Hemorrhoids 05/22/2015  . Hyperlipidemia   . Hypertension   . Obesity   . Osteoarthritis 01/13/2013  . Osteoarthritis    left knee, treating with injections in [facility]  . Peripheral edema 04/27/2016  . 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 Tongue burning sensation 09/18/2016  . Zinc deficiency 04/27/2016    Past Surgical History:  Procedure Laterality Date  . ANAL FISTULOTOMY N/A 08/07/2016   Procedure: ANAL FISTULOTOMY;  Surgeon: Abigail Miyamoto, MD;  Location: [facility] OR;  Service: General;  Laterality: N/A;  . BARIATRIC SURGERY  2015   duodenal switch [facility]  . collapsed lung  1983, 1988, 1992  . HEMORRHOID SURGERY N/A 08/07/2016   Procedure: HEMORRHOIDECTOMY;  Surgeon: Abigail Miyamoto, MD;  Location: [facility] OR;  Service: General;  Laterality: N/A;  . INGUINAL HERNIA REPAIR Left 04/10/2017   Procedure: LAPAROSCOPIC LEFT INGUINAL HERNIA REPAIR WITH MESH;  Surgeon: Abigail Miyamoto, MD;  Location: [facility];  Service: General;  Laterality: Left;  . INSERTION OF MESH Left 04/10/2017   Procedure: INSERTION OF MESH;  Surgeon: Abigail Miyamoto, MD;  Location: [facility];  Service: General;  Laterality: Left;  . IRRIGATION AND DEBRIDEMENT SEBACEOUS CYST     back    Current Outpatient Medications  Medication Sig Dispense Refill  . buPROPion (WELLBUTRIN XL) 150 MG 24 hr tablet TAKE 1 TABLET BY MOUTH THREE TIMES A DAY 90 tablet 2  . Calcium Carb-Cholecalciferol (CALCIUM PLUS VITAMIN D3) 600-500 MG-UNIT CAPS Take 1 tablet by mouth  daily.    . Cholecalciferol (VITAMIN D3) 1000 units CAPS Take 1,000 Units by mouth daily.    . clobetasol (TEMOVATE) 0.05 % external solution Apply 1 application topically daily as needed (itching). USE TO SCALP AS DIRECTED 50 mL 1  . diazepam (VALIUM) 5 MG tablet TAKE 2 TABLETS EVERY 8 HOURS AS NEEDED FOR ANXIETY 90 tablet 1  . DULoxetine (CYMBALTA) 30 MG capsule TAKE 3 CAPSULES EVERY DAY 90 capsule 5  . finasteride (PROPECIA) 1 MG tablet TAKE 1 TABLET (1 MG TOTAL) BY MOUTH DAILY. 30 tablet 7  . HYDROmorphone (DILAUDID) 4 MG tablet Take 0.5-2 tablets (2-8 mg total) by mouth every 6 (six) hours as needed for moderate pain or severe pain. 30 tablet 0  . lisinopril (PRINIVIL,ZESTRIL) 2.5 MG tablet TAKE 1 TABLET (2.5 MG TOTAL) BY MOUTH DAILY. 30 tablet 5  . Multiple Vitamins-Minerals (CENTRUM SILVER) tablet Take 2 tablets by mouth daily.    . nebivolol (BYSTOLIC) 5 MG tablet Take 1 tablet (5 mg total) by mouth 2 (two) times daily. 60 tablet 2  . Omega-3 300 MG CAPS Take 300 mg by mouth daily.    Marland Kitchen VIAGRA 100 MG tablet TAKE ONE-HALF TO 1 TABLET BY MOUTH DAILY AS NEEDED FOR ERECTILE DYSFUNCTION 10 tablet 1  . VITAMIN A PO Take 2,400 Units by mouth daily.    . Vitamin D, Ergocalciferol, (DRISDOL) 50000 units CAPS capsule TAKE 1 CAPSULE (50,000 UNITS TOTAL) BY MOUTH 2 (TWO) TIMES A WEEK. 8 capsule 4  . vitamin E 200 UNIT capsule Take 200 Units by mouth daily.    Marland Kitchen zinc gluconate 50 MG tablet Take 100 mg by mouth daily.    Marland Kitchen zolpidem (AMBIEN) 10 MG tablet TAKE 1 TABLET AT BEDTIME 30 tablet 3   Allergies  Allergen Reactions  . No Known Allergies     Social History   Socioeconomic History  . Marital status: Single  Occupational History  . Occupation: Airline pilot  Tobacco Use  . Smoking status: Current Every Day Smoker    Packs/day: 1.00    Years: 40.00    Pack years: 40.00    Types: Cigarettes    Last attempt to quit: 01/08/2013    Years since quitting: 4.2  . Smokeless tobacco: Never Used   . Tobacco comment: 3-4 cigarettes daily.   Patient started back smoking. smokes one pack per day.  Substance and Sexual Activity  . Alcohol use: Yes    Alcohol/week: 0.0 oz    Comment: once a month, glass of wine  . Drug use: No   Family History  Problem Relation Age of Onset  . Cancer Mother        ovarian  . Hyperlipidemia Father   . Hypertension Father   . Heart disease Father        MI 7/19  . Emphysema Father   . Pulmonary disease Father   . Heart disease Maternal Grandmother   . Heart disease Maternal Grandfather   . Stroke Maternal Grandfather   . Heart disease Paternal Grandmother   . Angina Paternal  Grandmother   . Heart disease Paternal Grandfather   . Stroke Paternal Grandfather   . Diabetes Maternal Uncle   . Kidney disease Maternal Uncle   . Diabetes Paternal Uncle   . Cancer Paternal Uncle      Review of Systems:  Constitutional:  no unexpected change in weight, no weakness, no unexplained fevers, sweats, or chills Eye:  no recent significant change in vision Ear:  no hearing loss Nose/Mouth/Throat:  No dental complaints Neck/Thyroid:  no lumps or masses Pulmonary:  no chronic cough, sputum, or hemoptysis and no shortness of breath Cardiovascular:  no exercise intolerance, no chest pain Gastrointestinal:  no abdominal pain and no change in bowel habits GU:  negative for dysuria, frequency, and incontinence Musculoskeletal/Extremities:  no peripheral edema Skin/Integumentary ROS:  no abnormal skin lesions reported Neurologic:  no numbness, tingling, or tremor   Objective:   Vitals:   04/18/17 1336  BP: 124/84  Pulse: 68  Temp: 98 F (36.7 C)  TempSrc: Oral  SpO2: 97%  Weight: 183 lb 6 oz (83.2 kg)  Height: 6\' 1"  (1.854 m)   Body mass index is 24.19 kg/m.  General:  well developed, well nourished, in no apparent distress Skin:  warm, no pallor or diaphoresis Head:  normocephalic, atraumatic Eyes:  pupils equal and round, sclera anicteric  without injection Ears:  canals without lesions, TMs shiny without retraction, no obvious effusion, no erythema Throat/Pharynx:  lips and gingiva without lesion; tongue and uvula midline; non-inflamed pharynx; no exudates or postnasal drainage Neck: neck supple without adenopathy, thyromegaly, or masses, no bruits, no jugular venous distention Lungs:  clear to auscultation, breath sounds equal bilaterally, no respiratory distress Cardio:  regular rate and rhythm without murmurs Abdomen:  abdomen soft, ttp over umbilical region- healing post op site from ventral hernia procedure (1 week out); bowel sounds normal; no masses, hepatomegaly or splenomegaly Musculoskeletal:  symmetrical muscle groups noted without atrophy or deformity Extremities:  no clubbing, cyanosis, or edema, no deformities, no skin discoloration Neuro:  gait normal; deep tendon reflexes normal and symmetric and alert and oriented to person, place, and time Psych: Age appropriate judgment and insight; normal mood   Assessment:   Pre-op exam - Plan: EKG 12-Lead, Basic metabolic panel   Plan:   Orders as above. EKG - unchanged from previous tracings. He is not on any antiplatelets or anticoagulation.  I am having him continue all his medication as written through his surgery date unless otherwise changed by his surgeon. The above laboratory work was ordered and will be sent with this physical. Pending the above lab, the patient is deemed low cardiac risk for the proposed procedure.  He is medically optimized and requires no further workup from a cardiac and primary care standpoint.  According to the Revised Goldman Criteria, he is at a 0.4% risk of major cardiac death.   The patient voiced understanding and agreement to the plan.  Jilda Rocheicholas Paul HolsteinWendling, DO 04/18/17  3:40 PM

## 2017-04-22 ENCOUNTER — Other Ambulatory Visit: Payer: Self-pay | Admitting: Family Medicine

## 2017-04-22 NOTE — Telephone Encounter (Signed)
He has transferred his PMD to Dr Carmelia RollerWendling so will have him decide if he will continue to prescribe.

## 2017-04-22 NOTE — Telephone Encounter (Signed)
Requesting:ambien Contract:yes UDS:low risk next screen 11/14/16 Last OV:04/18/17 Next OV:not scheduled  Last Refill:12/17/16   #[id number] Database:   Please advise

## 2017-04-24 ENCOUNTER — Encounter (HOSPITAL_COMMUNITY): Payer: Self-pay | Admitting: *Deleted

## 2017-04-24 ENCOUNTER — Other Ambulatory Visit: Payer: Self-pay

## 2017-04-24 NOTE — Progress Notes (Signed)
Pt denies SOB, chest pain, and being under the care of a cardiologist. Pt denies having an echo and cardiac cath but stated that a stress test was performed " 10 years ago." Pt denies having a chest x ray within the last year. Pt made aware to stop taking  Aspirin, vitamins, fish oil and herbal medications. Do not take any NSAIDs ie: Ibuprofen, Advil, Naproxen (Aleve), Motrin, BC and Goody Powder. Pt verbalized understanding of all pre-op instructions.

## 2017-04-25 ENCOUNTER — Ambulatory Visit (HOSPITAL_COMMUNITY): Payer: BLUE CROSS/BLUE SHIELD | Admitting: Anesthesiology

## 2017-04-25 ENCOUNTER — Encounter (HOSPITAL_COMMUNITY): Payer: Self-pay | Admitting: *Deleted

## 2017-04-25 ENCOUNTER — Encounter (HOSPITAL_COMMUNITY)
Admission: RE | Disposition: A | Discharge status: Home / Self Care | Payer: Self-pay | Source: Ambulatory Visit | Attending: Podiatry

## 2017-04-25 ENCOUNTER — Ambulatory Visit (HOSPITAL_COMMUNITY)
Admission: RE | Admit: 2017-04-25 | Discharge: 2017-04-25 | Disposition: A | Discharge status: Home / Self Care | Payer: BLUE CROSS/BLUE SHIELD | Source: Ambulatory Visit | Attending: Podiatry | Admitting: Podiatry

## 2017-04-25 ENCOUNTER — Ambulatory Visit (HOSPITAL_COMMUNITY): Payer: BLUE CROSS/BLUE SHIELD

## 2017-04-25 ENCOUNTER — Other Ambulatory Visit: Payer: Self-pay | Admitting: Family Medicine

## 2017-04-25 ENCOUNTER — Encounter: Payer: Self-pay | Admitting: Podiatry

## 2017-04-25 DIAGNOSIS — M1712 Unilateral primary osteoarthritis, left knee: Secondary | ICD-10-CM | POA: Diagnosis not present

## 2017-04-25 DIAGNOSIS — Z79899 Other long term (current) drug therapy: Secondary | ICD-10-CM | POA: Diagnosis not present

## 2017-04-25 DIAGNOSIS — M25775 Osteophyte, left foot: Secondary | ICD-10-CM | POA: Diagnosis not present

## 2017-04-25 DIAGNOSIS — Q6689 Other  specified congenital deformities of feet: Secondary | ICD-10-CM | POA: Diagnosis not present

## 2017-04-25 DIAGNOSIS — F17200 Nicotine dependence, unspecified, uncomplicated: Secondary | ICD-10-CM | POA: Insufficient documentation

## 2017-04-25 DIAGNOSIS — F329 Major depressive disorder, single episode, unspecified: Secondary | ICD-10-CM | POA: Diagnosis not present

## 2017-04-25 DIAGNOSIS — E785 Hyperlipidemia, unspecified: Secondary | ICD-10-CM | POA: Insufficient documentation

## 2017-04-25 DIAGNOSIS — M79672 Pain in left foot: Secondary | ICD-10-CM | POA: Diagnosis not present

## 2017-04-25 DIAGNOSIS — M898X7 Other specified disorders of bone, ankle and foot: Secondary | ICD-10-CM | POA: Diagnosis not present

## 2017-04-25 DIAGNOSIS — N400 Enlarged prostate without lower urinary tract symptoms: Secondary | ICD-10-CM | POA: Diagnosis not present

## 2017-04-25 DIAGNOSIS — J449 Chronic obstructive pulmonary disease, unspecified: Secondary | ICD-10-CM | POA: Insufficient documentation

## 2017-04-25 DIAGNOSIS — Z9884 Bariatric surgery status: Secondary | ICD-10-CM | POA: Diagnosis not present

## 2017-04-25 DIAGNOSIS — I10 Essential (primary) hypertension: Secondary | ICD-10-CM | POA: Insufficient documentation

## 2017-04-25 DIAGNOSIS — M899 Disorder of bone, unspecified: Secondary | ICD-10-CM | POA: Diagnosis not present

## 2017-04-25 DIAGNOSIS — F419 Anxiety disorder, unspecified: Secondary | ICD-10-CM | POA: Insufficient documentation

## 2017-04-25 DIAGNOSIS — E119 Type 2 diabetes mellitus without complications: Secondary | ICD-10-CM | POA: Diagnosis not present

## 2017-04-25 DIAGNOSIS — Z9889 Other specified postprocedural states: Secondary | ICD-10-CM

## 2017-04-25 HISTORY — PX: EXOSTECTECTOMY TOE: SHX6618

## 2017-04-25 LAB — HEMOGLOBIN: Hemoglobin: 14.9 g/dL (ref 13.0–17.0)

## 2017-04-25 SURGERY — EXCISION, EXOSTOSIS, TOE
Anesthesia: Monitor Anesthesia Care | Laterality: Left

## 2017-04-25 MED ORDER — PROPOFOL 10 MG/ML IV BOLUS
INTRAVENOUS | Status: AC
  Filled 2017-04-25: qty 20

## 2017-04-25 MED ORDER — CHLORHEXIDINE GLUCONATE 4 % EX LIQD: 60.0000 mL | Freq: Once | CUTANEOUS | Status: DC

## 2017-04-25 MED ORDER — LIDOCAINE 2% (20 MG/ML) 5 ML SYRINGE
INTRAMUSCULAR | Status: DC | PRN
  Administered 2017-04-25: 60 mg via INTRAVENOUS

## 2017-04-25 MED ORDER — CEFAZOLIN SODIUM-DEXTROSE 2-4 GM/100ML-% IV SOLN
INTRAVENOUS | Status: AC
Start: 2017-04-25 — End: 2017-04-25
  Filled 2017-04-25: qty 100

## 2017-04-25 MED ORDER — LIDOCAINE HCL 2 % IJ SOLN
INTRAMUSCULAR | Status: AC
  Filled 2017-04-25: qty 20

## 2017-04-25 MED ORDER — PROPOFOL 500 MG/50ML IV EMUL
INTRAVENOUS | Status: DC | PRN
  Administered 2017-04-25: 25 ug/kg/min via INTRAVENOUS

## 2017-04-25 MED ORDER — CEFAZOLIN SODIUM-DEXTROSE 2-4 GM/100ML-% IV SOLN
2.0000 g | INTRAVENOUS | Status: AC
  Administered 2017-04-25: 2 g via INTRAVENOUS

## 2017-04-25 MED ORDER — ACETAMINOPHEN 500 MG PO TABS
1000.0000 mg | ORAL_TABLET | Freq: Once | ORAL | Status: AC
  Administered 2017-04-25: 1000 mg via ORAL

## 2017-04-25 MED ORDER — BUPIVACAINE HCL (PF) 0.5 % IJ SOLN
INTRAMUSCULAR | Status: DC | PRN
  Administered 2017-04-25: 5 mL

## 2017-04-25 MED ORDER — BUPIVACAINE HCL (PF) 0.5 % IJ SOLN
INTRAMUSCULAR | Status: AC
  Filled 2017-04-25: qty 30

## 2017-04-25 MED ORDER — LACTATED RINGERS IV SOLN
INTRAVENOUS | Status: DC
  Administered 2017-04-25: 12:00:00 via INTRAVENOUS

## 2017-04-25 MED ORDER — LACTATED RINGERS IV SOLN
INTRAVENOUS | Status: DC | PRN
  Administered 2017-04-25: 14:00:00 via INTRAVENOUS

## 2017-04-25 MED ORDER — PROPOFOL 10 MG/ML IV BOLUS
INTRAVENOUS | Status: DC | PRN
  Administered 2017-04-25: 40 mg via INTRAVENOUS
  Administered 2017-04-25: 30 mg via INTRAVENOUS
  Administered 2017-04-25: 20 mg via INTRAVENOUS
  Administered 2017-04-25: 50 mg via INTRAVENOUS

## 2017-04-25 MED ORDER — LIDOCAINE HCL 2 % IJ SOLN
INTRAMUSCULAR | Status: DC | PRN
  Administered 2017-04-25: 5 mL

## 2017-04-25 MED ORDER — ACETAMINOPHEN 500 MG PO TABS
ORAL_TABLET | ORAL | Status: AC
  Administered 2017-04-25: 1000 mg via ORAL
  Filled 2017-04-25: qty 2

## 2017-04-25 MED ORDER — MIDAZOLAM HCL 5 MG/5ML IJ SOLN
INTRAMUSCULAR | Status: DC | PRN
  Administered 2017-04-25: 2 mg via INTRAVENOUS

## 2017-04-25 MED ORDER — FENTANYL CITRATE (PF) 250 MCG/5ML IJ SOLN
INTRAMUSCULAR | Status: AC
  Filled 2017-04-25: qty 5

## 2017-04-25 MED ORDER — MIDAZOLAM HCL 2 MG/2ML IJ SOLN
INTRAMUSCULAR | Status: AC
  Filled 2017-04-25: qty 2

## 2017-04-25 MED ORDER — FENTANYL CITRATE (PF) 100 MCG/2ML IJ SOLN
INTRAMUSCULAR | Status: DC | PRN
  Administered 2017-04-25: 50 ug via INTRAVENOUS
  Administered 2017-04-25: 25 ug via INTRAVENOUS

## 2017-04-25 SURGICAL SUPPLY — 43 items
BANDAGE ACE 4X5 VEL STRL LF (GAUZE/BANDAGES/DRESSINGS) ×2 IMPLANT
BANDAGE ELASTIC 4 VELCRO ST LF (GAUZE/BANDAGES/DRESSINGS) ×1 IMPLANT
BLADE LONG MED 31X9 (MISCELLANEOUS) ×1 IMPLANT
BLADE SURG 15 STRL LF DISP TIS (BLADE) ×2 IMPLANT
BLADE SURG 15 STRL SS (BLADE) ×4
BNDG CMPR 9X4 STRL LF SNTH (GAUZE/BANDAGES/DRESSINGS) ×1
BNDG ESMARK 4X9 LF (GAUZE/BANDAGES/DRESSINGS) ×2 IMPLANT
BNDG GAUZE ELAST 4 BULKY (GAUZE/BANDAGES/DRESSINGS) ×2 IMPLANT
CUFF TOURNIQUET SINGLE 18IN (TOURNIQUET CUFF) IMPLANT
CUFF TOURNIQUET SINGLE 24IN (TOURNIQUET CUFF) IMPLANT
DRAPE EXTREMITY T 121X128X90 (DRAPE) ×2 IMPLANT
DRAPE OEC MINIVIEW 54X84 (DRAPES) ×2 IMPLANT
DRAPE U-SHAPE 47X51 STRL (DRAPES) ×2 IMPLANT
ELECT REM PT RETURN 9FT ADLT (ELECTROSURGICAL) ×2
ELECTRODE REM PT RTRN 9FT ADLT (ELECTROSURGICAL) ×1 IMPLANT
GAUZE SPONGE 4X4 12PLY STRL (GAUZE/BANDAGES/DRESSINGS) ×2 IMPLANT
GAUZE XEROFORM 1X8 LF (GAUZE/BANDAGES/DRESSINGS) ×2 IMPLANT
GLOVE BIO SURGEON STRL SZ8 (GLOVE) IMPLANT
GLOVE BIOGEL PI IND STRL 8 (GLOVE) IMPLANT
GLOVE BIOGEL PI INDICATOR 8 (GLOVE)
GOWN STRL REUS W/ TWL LRG LVL3 (GOWN DISPOSABLE) ×1 IMPLANT
GOWN STRL REUS W/ TWL XL LVL3 (GOWN DISPOSABLE) ×1 IMPLANT
GOWN STRL REUS W/TWL LRG LVL3 (GOWN DISPOSABLE) ×2
GOWN STRL REUS W/TWL XL LVL3 (GOWN DISPOSABLE) ×2
KIT BASIN OR (CUSTOM PROCEDURE TRAY) ×2 IMPLANT
NDL HYPO 25X1 1.5 SAFETY (NEEDLE) ×1 IMPLANT
NDL SAFETY ECLIPSE 18X1.5 (NEEDLE) IMPLANT
NEEDLE HYPO 18GX1.5 SHARP (NEEDLE)
NEEDLE HYPO 25X1 1.5 SAFETY (NEEDLE) ×2 IMPLANT
NS IRRIG 1000ML POUR BTL (IV SOLUTION) IMPLANT
PACK ORTHO EXTREMITY (CUSTOM PROCEDURE TRAY) ×2 IMPLANT
PAD CAST 4YDX4 CTTN HI CHSV (CAST SUPPLIES) IMPLANT
PADDING CAST ABS 4INX4YD NS (CAST SUPPLIES)
PADDING CAST ABS COTTON 4X4 ST (CAST SUPPLIES) ×2 IMPLANT
PADDING CAST COTTON 4X4 STRL (CAST SUPPLIES) ×2
SCRUB BETADINE 4OZ XXX (MISCELLANEOUS) ×4 IMPLANT
SPLINT FIBERGLASS 4X30 (CAST SUPPLIES) ×1 IMPLANT
STAPLER VISISTAT (STAPLE) IMPLANT
SUT PROLENE 4 0 PS 2 18 (SUTURE) ×2 IMPLANT
SUT VICRYL 4-0 PS2 18IN ABS (SUTURE) IMPLANT
SYR BULB 3OZ (MISCELLANEOUS) ×2 IMPLANT
SYR CONTROL 10ML LL (SYRINGE) IMPLANT
UNDERPAD 30X30 (UNDERPADS AND DIAPERS) ×2 IMPLANT

## 2017-04-25 NOTE — Anesthesia Procedure Notes (Signed)
Procedure Name: MAC Date/Time: 04/25/2017 2:21 PM Performed by: Neldon Newport, CRNA Pre-anesthesia Checklist: Timeout performed, Patient being monitored, Suction available, Emergency Drugs available and Patient identified Patient Re-evaluated:Patient Re-evaluated prior to induction Oxygen Delivery Method: Nasal cannula

## 2017-04-25 NOTE — Op Note (Signed)
OPERATIVE REPORT Patient name: Daniel [name] MRN: [id number] DOB: Sep 01, 1955  DOS:  04/25/2017  Preop Dx: Exostosis first metatarsal left foot.  Exostosis medial cuneiform left foot. Postop Dx: same  Procedure:  1.  Exostectomy first metatarsal left foot 2.  Exostectomy medial cuneiform left foot  Surgeon: Felecia Shelling DPM  Anesthesia: 50-50 mixture of 2% lidocaine plain with 0.5% Marcaine plain totaling 10 cc infiltrated in the patient's left lower extremity  Hemostasis: Ankle tourniquet inflated to a pressure of after esmarch exsanguination   EBL: 10 mL Materials: None Injectables: None Pathology: None  Condition: The patient tolerated the procedure and anesthesia well. No complications noted or reported   Justification for procedure: The patient is a 62 y.o. male who presents today for surgical correction of a painful exostosis to the medial cuneiform as well as the base of the first metatarsal on the left foot. All conservative modalities of been unsuccessful in providing any sort of satisfactory alleviation of symptoms with the patient. The patient was told benefits as well as possible side effects of the surgery. The patient consented for surgical correction. The patient consent form was reviewed. All patient questions were answered. No guarantees were expressed or implied. The patient and the surgeon boson the patient consent form with the witness present and placed in the patient's chart.   Procedure in Detail: The patient was brought to the operating room, placed in the operating table in the supine position at which time an aseptic scrub and drape were performed about the patient's respective lower extremity after anesthesia was induced as described above. Attention was then directed to the surgical area where procedure number one commenced.  Procedure #1: Exostectomy first metatarsal left foot A 3 cm linear longitudinal skin incision was planned and made  overlying the base of the first metatarsal left foot.  The incision was carried down to the level of bone and joint capsule with care taken to cut clamp ligate to retract well small neurovascular structures traversing the incision site as well as the tendinous structures within the incision site.  The capsular and periosteal tissue was sharply dissected away using a surgical #15 blade to allow exposure to the base of the first metatarsal.  At this time a sagittal blade wound sagittal saw was utilized to resect away the hypertrophic eminence about the base of the first metatarsal down to a smooth anatomical contour.  Saucerization technique was performed.  Intraoperative x-ray fluoroscopy was utilized to visualize the reduction of the hypertrophic exostosis which was satisfactory.  Attention was then directed more proximal to the medial cuneiform where procedure #2 commenced.  Procedure #2: Exostectomy medial cuneiform left foot The incision previously mentioned procedure #1 was extended proximally to allow for exposure of the medial cuneiform.  Careful sharp tissue dissection was performed in the same manner as previously mentioned to allow exposure to the medial cuneiform hypertrophic bone formation.  Again a sagittal saw mount a sagittal blade was utilized to resect away the hypertrophic exostosis to the medial cuneiform.  Saucerization technique was again utilized.  After resection of the hypertrophic portions of the medial cuneiform down to a smooth anatomical contour intraoperative x-ray.  He was again utilized.  Copious irrigation was then utilized in preparation for routine layered soft tissue closure.  4-0 Vicryl suture was utilized to approximate subcutaneous tissue followed by 4-0 Vicryl suture to reapproximate subcuticular tissue followed by stainless steel skin staples to reapproximate superficial skin edges.  Dry sterile compressive  dressings were then applied to all previously mentioned incision  sites about the patient's lower extremity. The tourniquet which was used for hemostasis was deflated. All normal neurovascular responses including pink color and warmth returned all the digits of patient's lower extremity.  The patient was then transferred from the operating room to the recovery room having tolerated the procedure and anesthesia well. All vital signs are stable. After a brief stay in the recovery room the patient was discharged with adequate prescriptions for analgesia. Verbal as well as written instructions were provided for the patient regarding wound care. The patient is to keep the dressings clean dry and intact until they are to follow surgeon Dr. Gala Lewandowsky in the office upon discharge.   Felecia Shelling, DPM [facility]  Dr. Felecia Shelling, DPM    [address]                                        [place], [place] [address]                Office [phone]  Fax [phone]

## 2017-04-25 NOTE — Interval H&P Note (Signed)
History and Physical Interval Note:  04/25/2017 1:37 PM  Daniel [name]  has presented today for surgery, with the diagnosis of exostosis  The various methods of treatment have been discussed with the patient and family. After consideration of risks, benefits and other options for treatment, the patient has consented to  Procedure(s): TARSAL EXOSTECTOMY OF MEDIAL CUNEIFORM AND EXOSTECTOMY OF THE FIRST METATARSAL (Left) as a surgical intervention .  The patient's history has been reviewed, patient examined, no change in status, stable for surgery.  I have reviewed the patient's chart and labs.  Questions were answered to the patient's satisfaction.     [name]

## 2017-04-25 NOTE — Brief Op Note (Signed)
04/25/2017  2:54 PM  PATIENT:  Daniel [name]  62 y.o. male  PRE-OPERATIVE DIAGNOSIS:  exostosis  POST-OPERATIVE DIAGNOSIS:  exostosis  PROCEDURE:  Procedure(s): TARSAL EXOSTECTOMY OF MEDIAL CUNEIFORM AND EXOSTECTOMY OF THE FIRST METATARSAL (Left)  SURGEON:  Surgeon(s) and Role:    Felecia Shelling* Erle Guster M, DPM - Primary  PHYSICIAN ASSISTANT:   ASSISTANTS: none   ANESTHESIA:   local and IV sedation  EBL:  1 mL   BLOOD ADMINISTERED:none  DRAINS: none   LOCAL MEDICATIONS USED:  MARCAINE    and LIDOCAINE   SPECIMEN:  No Specimen  DISPOSITION OF SPECIMEN:  N/A  COUNTS:  YES  TOURNIQUET:   Total Tourniquet Time Documented: Calf (Left) - 0 minutes Total: Calf (Left) - 0 minutes   DICTATION: .Reubin Milanragon Dictation  PLAN OF CARE: Discharge to home after PACU  PATIENT DISPOSITION:  PACU - hemodynamically stable.   Delay start of Pharmacological VTE agent (>24hrs) due to surgical blood loss or risk of bleeding: not applicable  Felecia ShellingBrent M. Lenox Bink, DPM [facility]  Dr. Felecia ShellingBrent M. Collins Kerby, DPM    [address]                                    [place], [place] [address]                Office [phone]  Fax [phone]) [phone]

## 2017-04-25 NOTE — Anesthesia Preprocedure Evaluation (Addendum)
Anesthesia Evaluation  Patient identified by MRN, date of birth, ID band Patient awake    Reviewed: Allergy & Precautions, NPO status , Patient's Chart, lab work & pertinent test results, reviewed documented beta blocker date and time   Airway Mallampati: II  TM Distance: >3 FB Neck ROM: Full    Dental no notable dental hx. (+) Dental Advisory Given   Pulmonary COPD, Current Smoker,  Hx OSA now resolved s/p weight loss   Pulmonary exam normal breath sounds clear to auscultation       Cardiovascular Exercise Tolerance: Good hypertension, Pt. on medications and Pt. on home beta blockers Normal cardiovascular exam Rhythm:Regular Rate:Normal  ECG: NSR rate 67   Neuro/Psych Anxiety Depression negative neurological ROS     GI/Hepatic negative GI ROS, Neg liver ROS, Hx gastric bypass   Endo/Other  diabetesHx DM now resolved s/p weight loss  Renal/GU negative Renal ROS   BPH    Musculoskeletal  (+) Arthritis , Osteoarthritis,    Abdominal   Peds negative pediatric ROS (+)  Hematology negative hematology ROS (+)   Anesthesia Other Findings   Reproductive/Obstetrics negative OB ROS                           Anesthesia Physical  Anesthesia Plan  ASA: III  Anesthesia Plan: MAC   Post-op Pain Management:    Induction: Intravenous  PONV Risk Score and Plan: 2 and Treatment may vary due to age or medical condition and Propofol infusion  Airway Management Planned: Natural Airway and Simple Face Mask  Additional Equipment: None  Intra-op Plan:   Post-operative Plan: Extubation in OR  Informed Consent: I have reviewed the patients History and Physical, chart, labs and discussed the procedure including the risks, benefits and alternatives for the proposed anesthesia with the patient or authorized representative who has indicated his/her understanding and acceptance.   Dental advisory  given  Plan Discussed with: CRNA and Surgeon  Anesthesia Plan Comments:        Anesthesia Quick Evaluation

## 2017-04-25 NOTE — Anesthesia Postprocedure Evaluation (Signed)
Anesthesia Post Note  Patient: Daniel [name]  Procedure(s) Performed: TARSAL EXOSTECTOMY OF MEDIAL CUNEIFORM AND EXOSTECTOMY OF THE FIRST METATARSAL (Left )     Patient location during evaluation: PACU Anesthesia Type: MAC Level of consciousness: awake and alert Pain management: pain level controlled Vital Signs Assessment: post-procedure vital signs reviewed and stable Respiratory status: spontaneous breathing, nonlabored ventilation and respiratory function stable Cardiovascular status: stable and blood pressure returned to baseline Anesthetic complications: no    Last Vitals:  Vitals:   04/25/17 1500 04/25/17 1510  BP: 116/69 115/79  Pulse: (!) 58 (!) 57  Resp: 13 12  Temp:  36.5 C  SpO2: 99% 100%    Last Pain:  Vitals:   04/25/17 1510  TempSrc:   PainSc: 0-No pain    LLE Motor Response: Purposeful movement;Responds to commands (04/25/17 1510) LLE Sensation: Full sensation (04/25/17 1510)          Audry Pili

## 2017-04-25 NOTE — Transfer of Care (Signed)
Immediate Anesthesia Transfer of Care Note  Patient: Daniel [name]  Procedure(s) Performed: TARSAL EXOSTECTOMY OF MEDIAL CUNEIFORM AND EXOSTECTOMY OF THE FIRST METATARSAL (Left )  Patient Location: PACU  Anesthesia Type:MAC  Level of Consciousness: awake, alert  and oriented  Airway & Oxygen Therapy: Patient Spontanous Breathing  Post-op Assessment: Report given to RN, Post -op Vital signs reviewed and stable and Patient moving all extremities X 4  Post vital signs: Reviewed and stable  Last Vitals:  Vitals:   04/25/17 1143  BP: 126/73  Pulse: 66  Resp: 18  Temp: 36.8 C  SpO2: 100%    Last Pain:  Vitals:   04/25/17 1143  TempSrc: Oral      Patients Stated Pain Goal: 2 (54/62/70 3500)  Complications: No apparent anesthesia complications

## 2017-04-26 ENCOUNTER — Encounter (HOSPITAL_COMMUNITY): Payer: Self-pay | Admitting: Podiatry

## 2017-04-26 NOTE — Telephone Encounter (Signed)
Requesting: Zolpidem Contract: Yes UDS: 2.29.2018 Low-risk; next screen 8.29.2018 Last OV: 1.31.2019 (pre-op) Next OV: Not scheduled Last Refill: 1.04.2019   Please advise

## 2017-04-30 ENCOUNTER — Encounter: Payer: BLUE CROSS/BLUE SHIELD | Admitting: Family Medicine

## 2017-05-01 ENCOUNTER — Encounter: Payer: Self-pay | Admitting: Podiatry

## 2017-05-01 ENCOUNTER — Ambulatory Visit: Payer: BLUE CROSS/BLUE SHIELD

## 2017-05-01 ENCOUNTER — Ambulatory Visit (INDEPENDENT_AMBULATORY_CARE_PROVIDER_SITE_OTHER): Payer: BLUE CROSS/BLUE SHIELD | Admitting: Podiatry

## 2017-05-01 VITALS — BP 115/71 | HR 61 | Resp 16

## 2017-05-01 DIAGNOSIS — M898X7 Other specified disorders of bone, ankle and foot: Secondary | ICD-10-CM

## 2017-05-01 DIAGNOSIS — Z9889 Other specified postprocedural states: Secondary | ICD-10-CM

## 2017-05-05 NOTE — Progress Notes (Signed)
   Subjective:  Patient presents today status post tarsal exostectomy left. DOS: 04/25/17. He states he is doing well. He reports some mild pain. He rates it at 2/10 at this time. He denies any new complaints. Patient is here for further evaluation and treatment.    Past Medical History:  Diagnosis Date  . Anxiety   . Bone spur of left foot   . BPH (benign prostatic hyperplasia) 08/22/2011  . Colon polyps   . COPD (chronic obstructive pulmonary disease) (HCC)   . Depression   . Diabetes mellitus without complication (HCC)     " no longer has diabetes as of 2016"   . H/O gastric bypass 04/27/2016   [facility]  . Hemorrhoids 05/22/2015  . Hyperlipidemia   . Hypertension   . Obesity   . Osteoarthritis 01/13/2013  . Osteoarthritis    [facility]  . Peripheral edema 04/27/2016  . Sleep apnea     " no longer have it as of 2016 "  . 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 Tongue burning sensation 09/18/2016  . Zinc deficiency 04/27/2016      Objective/Physical Exam Neurovascular status intact.  Skin incisions appear to be well coapted with sutures and staples intact. No sign of infectious process noted. No dehiscence. No active bleeding noted. Moderate edema noted to the surgical extremity.  Assessment: 1. s/p tarsal exostectomy left. DOS: 04/25/17   Plan of Care:  1. Patient was evaluated. X-rays declined by patient. 2. Dressing changed. 3. Continue weightbearing in post op shoe.  4. Return to clinic in 1 week for staple removal.    Felecia ShellingBrent M. Evans, DPM [facility]  Dr. Felecia ShellingBrent M. Evans, DPM    [address]                                        [place], [place] [address]                Office [phone]  Fax [phone]) [phone]

## 2017-05-08 ENCOUNTER — Encounter: Payer: Self-pay | Admitting: Podiatry

## 2017-05-08 ENCOUNTER — Ambulatory Visit (INDEPENDENT_AMBULATORY_CARE_PROVIDER_SITE_OTHER): Payer: BLUE CROSS/BLUE SHIELD | Admitting: Podiatry

## 2017-05-08 ENCOUNTER — Other Ambulatory Visit: Payer: Self-pay

## 2017-05-08 DIAGNOSIS — Z9889 Other specified postprocedural states: Secondary | ICD-10-CM

## 2017-05-08 DIAGNOSIS — M898X7 Other specified disorders of bone, ankle and foot: Secondary | ICD-10-CM

## 2017-05-08 MED ORDER — HYDROMORPHONE HCL 4 MG PO TABS: 4.0000 mg | ORAL_TABLET | Freq: Four times a day (QID) | ORAL | 0 refills | Status: DC | PRN

## 2017-05-08 MED ORDER — DOXYCYCLINE HYCLATE 100 MG PO TABS: 100.0000 mg | ORAL_TABLET | Freq: Two times a day (BID) | ORAL | 0 refills | Status: DC

## 2017-05-09 ENCOUNTER — Other Ambulatory Visit: Payer: Self-pay | Admitting: Family Medicine

## 2017-05-09 NOTE — Telephone Encounter (Signed)
Last office visit on 04/18/2016 Last refill 01/02/2017  #90 with 1 refill

## 2017-05-12 NOTE — Progress Notes (Signed)
   Subjective:  Patient presents today status post tarsal exostectomy left. DOS: 04/25/17. He reports some soreness to the left foot. He states the bandage fell off while he was sleeping and he then decided to wash the foot. He now reports associated erythema and edema to the surgical site. Patient is here for further evaluation and treatment.    Past Medical History:  Diagnosis Date  . Anxiety   . Bone spur of left foot   . BPH (benign prostatic hyperplasia) 08/22/2011  . Colon polyps   . COPD (chronic obstructive pulmonary disease) (HCC)   . Depression   . Diabetes mellitus without complication (HCC)     " no longer has diabetes as of 2016"   . H/O gastric bypass 04/27/2016   [facility], 2013  . Hemorrhoids 05/22/2015  . Hyperlipidemia   . Hypertension   . Obesity   . Osteoarthritis 01/13/2013  . Osteoarthritis    left knee, [facility]  . Peripheral edema 04/27/2016  . Sleep apnea     " no longer have it as of 2016 "  . 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 Tongue burning sensation 09/18/2016  . Zinc deficiency 04/27/2016      Objective/Physical Exam Neurovascular status intact. Skin incisions appear to be well coapted with sutures and staples intact. Mild erythema and edema noted to the peri-incisional area. No dehiscence. No active bleeding noted. Moderate edema noted to the surgical extremity.  Assessment: 1. s/p tarsal exostectomy left. DOS: 04/25/17 2. Mild, localized cellulitis left   Plan of Care:  1. Patient was evaluated.  2. Staples removed. Patient may begin washing foot.  3. Prescription for Doxycycline to address mild cellulitis to peri-incisional area.  4. Continue weightbearing in post op shoe.  5. Return to clinic in 2 weeks.    Felecia ShellingBrent M. Charlene Detter, DPM [facility]  Dr. Felecia ShellingBrent M. Rockne Dearinger, DPM    [address]                                        [place],  [place] [address]                Office [phone]) [phone]  Fax [phone]

## 2017-05-14 ENCOUNTER — Other Ambulatory Visit: Payer: Self-pay | Admitting: Family Medicine

## 2017-05-21 DIAGNOSIS — L723 Sebaceous cyst: Secondary | ICD-10-CM | POA: Diagnosis not present

## 2017-05-22 ENCOUNTER — Ambulatory Visit (INDEPENDENT_AMBULATORY_CARE_PROVIDER_SITE_OTHER): Payer: BLUE CROSS/BLUE SHIELD | Admitting: Podiatry

## 2017-05-22 ENCOUNTER — Encounter: Payer: Self-pay | Admitting: Podiatry

## 2017-05-22 ENCOUNTER — Ambulatory Visit (INDEPENDENT_AMBULATORY_CARE_PROVIDER_SITE_OTHER): Payer: BLUE CROSS/BLUE SHIELD

## 2017-05-22 DIAGNOSIS — M898X7 Other specified disorders of bone, ankle and foot: Secondary | ICD-10-CM

## 2017-05-22 DIAGNOSIS — Z9889 Other specified postprocedural states: Secondary | ICD-10-CM

## 2017-05-26 NOTE — Progress Notes (Signed)
   Subjective:  Patient presents today status post tarsal exostectomy left. DOS: 04/25/17. He reports some pain and swelling to the dorsum of the left foot. Wearing certain shoes and applying pressure to the area increases the pain. He states he has completed the course of Doxycyline as directed. Patient is here for further evaluation and treatment.    Past Medical History:  Diagnosis Date  . Anxiety   . Bone spur of left foot   . BPH (benign prostatic hyperplasia) 08/22/2011  . Colon polyps   . COPD (chronic obstructive pulmonary disease) (HCC)   . Depression   . Diabetes mellitus without complication (HCC)     " no longer has diabetes as of 2016"   . H/O gastric bypass 04/27/2016   [facility], 2013  . Hemorrhoids 05/22/2015  . Hyperlipidemia   . Hypertension   . Obesity   . Osteoarthritis 01/13/2013  . Osteoarthritis    left [facility], [facility]  . Peripheral edema 04/27/2016  . Sleep apnea     " no longer have it as of 2016 "  . 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 Tongue burning sensation 09/18/2016  . Zinc deficiency 04/27/2016      Objective/Physical Exam Neurovascular status intact. Skin incisions appear to be well coapted. Mild erythema and edema noted to the peri-incisional area. No dehiscence. No active bleeding noted. Moderate edema noted to the surgical extremity.  Radiographic Exam:  Osteotomies sites appear to be stable with routine healing.   Assessment: 1. s/p tarsal exostectomy left. DOS: 04/25/17 2. Mild, localized cellulitis left   Plan of Care:  1. Patient was evaluated. X-Rays reviewed.  2. Resume wearing normal shoe gear.  3. Compression anklet dispensed.  4. May resume full activity with no restrictions.  5. Return to clinic as needed.    Felecia ShellingBrent M. Deron Poole, DPM [facility]  Dr. Felecia ShellingBrent M. Abdiel Blackerby, DPM    [address]                                         [place], [place] [address]                Office [phone]) [phone]  Fax [phone]) [phone]

## 2017-05-27 NOTE — Progress Notes (Signed)
Exostectomy first metatarsal left.  Exostectomy medial cuneiform left.

## 2017-05-31 DIAGNOSIS — L281 Prurigo nodularis: Secondary | ICD-10-CM | POA: Diagnosis not present

## 2017-06-11 DIAGNOSIS — F330 Major depressive disorder, recurrent, mild: Secondary | ICD-10-CM | POA: Diagnosis not present

## 2017-06-11 DIAGNOSIS — F411 Generalized anxiety disorder: Secondary | ICD-10-CM | POA: Diagnosis not present

## 2017-06-14 DIAGNOSIS — L720 Epidermal cyst: Secondary | ICD-10-CM | POA: Diagnosis not present

## 2017-06-19 ENCOUNTER — Other Ambulatory Visit: Payer: Self-pay | Admitting: Family Medicine

## 2017-06-19 DIAGNOSIS — E559 Vitamin D deficiency, unspecified: Secondary | ICD-10-CM

## 2017-07-01 ENCOUNTER — Other Ambulatory Visit: Payer: Self-pay | Admitting: Family Medicine

## 2017-07-01 NOTE — Telephone Encounter (Signed)
Last office visit on 04/18/2017 Last refill on 04/26/2017  #30 with 1 refill

## 2017-07-23 DIAGNOSIS — F411 Generalized anxiety disorder: Secondary | ICD-10-CM | POA: Diagnosis not present

## 2017-07-23 DIAGNOSIS — F330 Major depressive disorder, recurrent, mild: Secondary | ICD-10-CM | POA: Diagnosis not present

## 2017-07-23 DIAGNOSIS — R5382 Chronic fatigue, unspecified: Secondary | ICD-10-CM | POA: Diagnosis not present

## 2017-07-25 DIAGNOSIS — K603 Anal fistula: Secondary | ICD-10-CM | POA: Diagnosis not present

## 2017-08-05 ENCOUNTER — Other Ambulatory Visit: Payer: Self-pay | Admitting: Family Medicine

## 2017-08-05 NOTE — Telephone Encounter (Signed)
Copied from CRM [id number]. Topic: General - Other >> Aug 02, 2017  3:38 PM Elliot Gault wrote: Relation to pt: self  Call back number:[phone] Pharmacy: [id number] - [place], [place] - [address] [phone] (Phone) [phone] (Fax)  Reason for call:  Patient would like to increase VIAGRA 100 MG tablet frequency to 8x daily, please advise  >> Aug 02, 2017  3:46 PM Elliot Gault wrote: Relation to pt: self  Call back [phone] Pharmacy: [id number] - [place], [place] - [address] [phone] (Phone) [phone] (Fax)  Reason for call:  Patient would like to increase VIAGRA 100 MG tablet frequency to 8x daily, please advise  >> Aug 02, 2017  3:50 PM Mcneil, Ja-Kwan wrote: Pt called back stating his request is to receive 8 pills per month

## 2017-08-05 NOTE — Telephone Encounter (Signed)
I am covering for Dr. Carmelia Roller. Please advise pt that max dose for viagra is  once daily.  We are unable to increase frequency.  If this dose is not effective for him please let me know and we can refer him to urology to discuss other treatment options.

## 2017-08-06 MED ORDER — SILDENAFIL CITRATE 100 MG PO TABS: ORAL_TABLET | ORAL | 5 refills | Status: DC

## 2017-08-06 NOTE — Telephone Encounter (Signed)
Rx has been sent for 8 tabs monthly.

## 2017-08-06 NOTE — Telephone Encounter (Signed)
Called to inform the patient prescription sent in.

## 2017-08-06 NOTE — Telephone Encounter (Signed)
Called the patient and the message from [facility] was incorrect. He currently has been getting 4 PER MONTH AND WOULD LIKE TO INCREASE TO 8 PER MONTH--NOT DAILY. He was understandable upset that this message was communicated incorrectly.

## 2017-08-14 ENCOUNTER — Ambulatory Visit (INDEPENDENT_AMBULATORY_CARE_PROVIDER_SITE_OTHER): Payer: BLUE CROSS/BLUE SHIELD | Admitting: Family Medicine

## 2017-08-14 ENCOUNTER — Encounter: Payer: Self-pay | Admitting: Family Medicine

## 2017-08-14 VITALS — BP 140/80 | HR 68 | Temp 98.5°F | Ht 73.0 in | Wt 179.1 lb

## 2017-08-14 DIAGNOSIS — L649 Androgenic alopecia, unspecified: Secondary | ICD-10-CM | POA: Diagnosis not present

## 2017-08-14 DIAGNOSIS — E559 Vitamin D deficiency, unspecified: Secondary | ICD-10-CM

## 2017-08-14 DIAGNOSIS — R6882 Decreased libido: Secondary | ICD-10-CM | POA: Diagnosis not present

## 2017-08-14 DIAGNOSIS — R195 Other fecal abnormalities: Secondary | ICD-10-CM

## 2017-08-14 DIAGNOSIS — R5383 Other fatigue: Secondary | ICD-10-CM | POA: Diagnosis not present

## 2017-08-14 MED ORDER — FINASTERIDE 1 MG PO TABS: 1.0000 mg | ORAL_TABLET | Freq: Every day | ORAL | 11 refills | Status: DC

## 2017-08-14 NOTE — Patient Instructions (Signed)
Let us know if you need anything.  Take 1 serving of Metamucil daily for the next 2 weeks. Contact Dr. Adela Lank if no improvement.   Depending on results, I will have a plan regarding your testosterone.

## 2017-08-14 NOTE — Progress Notes (Signed)
Chief Complaint  Patient presents with  . Follow-up    check testosterone levels/BM changes    Subjective: Patient is a 62 y.o. male here for possible low T.  Over past 6 mo has gotten worse, this has been going on for years though.  He has a history of low testosterone but has not been taking it in several years.  One time he saw an endocrinologist and was told that his testosterone was on the lower end of normal but nothing needed to be done.  This frustrated him.  He has been expensing low libido, erectile dysfunction, fatigue, irritability.   Has been having loose and freq stools over past 1-2 weeks. He has been having blood in stool, but has issues with fistulas for which he's following with GS team for. No wt loss. Last colonoscopy 1 year ago. No pain, fevers, n/v. Has not tried anything at home.    ROS: GU: +ED GI: As noted in HPI  Family History  Problem Relation Age of Onset  . Cancer Mother        ovarian  . Hyperlipidemia Father   . Hypertension Father   . Heart disease Father        MI 7/19  . Emphysema Father   . Pulmonary disease Father   . Heart disease Maternal Grandmother   . Heart disease Maternal Grandfather   . Stroke Maternal Grandfather   . Heart disease Paternal Grandmother   . Angina Paternal Grandmother   . Heart disease Paternal Grandfather   . Stroke Paternal Grandfather   . Diabetes Maternal Uncle   . Kidney disease Maternal Uncle   . Diabetes Paternal Uncle   . Cancer Paternal Uncle    Past Medical History:  Diagnosis Date  . Anxiety   . Bone spur of left foot   . BPH (benign prostatic hyperplasia) 08/22/2011  . Colon polyps   . COPD (chronic obstructive pulmonary disease) (HCC)   . Depression   . Diabetes mellitus without complication (HCC)     " no longer has diabetes as of 2016"   . H/O gastric bypass 04/27/2016   Duodenal switch at [facility] Jul 30, 2013  . Hemorrhoids 05/22/2015  . Hyperlipidemia   . Hypertension   . Obesity   .  Osteoarthritis 01/13/2013  . Osteoarthritis    left knee, treating with injections in [facility]  . Peripheral edema 04/27/2016  . Sleep apnea     " no longer have it as of 2016 "  . 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 Tongue burning sensation 09/18/2016  . Zinc deficiency 04/27/2016   Allergies  Allergen Reactions  . No Known Allergies     Current Outpatient Medications:  .  buPROPion (WELLBUTRIN XL) 150 MG 24 hr tablet, TAKE 1 TABLET BY MOUTH THREE TIMES A DAY (Patient taking differently:  by mouth three times daily), Disp: 90 tablet, Rfl: 2 .  BYSTOLIC 5 MG tablet, TAKE 1 TABLET BY MOUTH TWICE A DAY, Disp: 60 tablet, Rfl: 1 .  Calcium Carb-Cholecalciferol (CALCIUM PLUS VITAMIN D3) 600-500 MG-UNIT CAPS, Take 1 tablet by mouth daily., Disp: , Rfl:  .  Cholecalciferol (VITAMIN D3) 1000 units CAPS, Take 1,000 Units by mouth daily., Disp: , Rfl:  .  clobetasol (TEMOVATE) 0.05 % external solution, Apply 1 application topically daily as needed (itching). USE TO SCALP AS DIRECTED, Disp: 50 mL, Rfl: 1 .  diazepam (VALIUM) 5 MG tablet, TAKE 2 TABLETS EVERY 8  HOURS AS NEEDED FOR ANXIETY, Disp: 90 tablet, Rfl: 1 .  doxycycline (VIBRA-TABS) 100 MG tablet, Take 1 tablet (100 mg total) by mouth 2 (two) times daily., Disp: 20 tablet, Rfl: 0 .  DULoxetine (CYMBALTA) 30 MG capsule, TAKE 3 CAPSULES EVERY DAY (Patient taking differently:  by mouth once daily), Disp: 90 capsule, Rfl: 5 .  finasteride (PROPECIA) 1 MG tablet, Take 1 tablet (1 mg total) by mouth daily., Disp: 30 tablet, Rfl: 11 .  HYDROmorphone (DILAUDID) 4 MG tablet, Take 1 tablet (4 mg total) by mouth every 6 (six) hours as needed for moderate pain or severe pain., Disp: 30 tablet, Rfl: 0 .  lisinopril (PRINIVIL,ZESTRIL) 2.5 MG tablet, TAKE 1 TABLET (2.5 MG TOTAL) BY MOUTH DAILY., Disp: 30 tablet, Rfl: 5 .  Multiple Vitamins-Minerals (CENTRUM SILVER) tablet, Take 2 tablets by mouth daily., Disp: ,  Rfl:  .  Omega-3 300 MG CAPS, Take 300 mg by mouth daily., Disp: , Rfl:  .  sildenafil (VIAGRA) 100 MG tablet, TAKE ONE-HALF TO 1 TABLET BY MOUTH DAILY AS NEEDED FOR ERECTILE DYSFUNCTION, Disp: 8 tablet, Rfl: 5 .  VITAMIN A PO, Take 2,400 Units by mouth daily., Disp: , Rfl:  .  Vitamin D, Ergocalciferol, (DRISDOL) 50000 units CAPS capsule, TAKE 1 CAPSULE (50,000 UNITS TOTAL) BY MOUTH 2 (TWO) TIMES A WEEK., Disp: 8 capsule, Rfl: 4 .  vitamin E 200 UNIT capsule, Take 200 Units by mouth daily., Disp: , Rfl:  .  zinc gluconate 50 MG tablet, Take 100 mg by mouth daily., Disp: , Rfl:  .  zolpidem (AMBIEN) 10 MG tablet, TAKE 1 TABLET BY MOUTH EVERYDAY AT BEDTIME., Disp: 30 tablet, Rfl: 5 .  Amphetamine Sulfate 10 MG TABS, Take 1 tablet by mouth 2 (two) times daily., Disp: 60 tablet, Rfl: 0  Objective: BP 140/80 (BP Location: Left Arm, Patient Position: Sitting, Cuff Size: Normal)   Pulse 68   Temp 98.5 F (36.9 C) (Oral)   Ht  (1.854 m)   Wt 179 lb 2 oz (81.3 kg)   SpO2 97%   BMI 23.63 kg/m  General: Awake, appears stated age HEENT: MMM, EOMi Heart: RRR, no murmurs  Lungs: CTAB, no rales, wheezes or rhonchi. 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 Ck future labs, AM. Will verify if low.  Fiber to add bulk, ck Celiac panel. If no improvement after 2-3 weeks, contact Dr. Lanetta Inch office. F/u pending above. The patient voiced understanding and agreement to the plan.  Jilda Roche [place], DO 08/14/17  4:48 PM

## 2017-08-14 NOTE — Progress Notes (Signed)
Pre visit review using our clinic review tool, if applicable. No additional management support is needed unless otherwise documented below in the visit note. 

## 2017-08-15 ENCOUNTER — Other Ambulatory Visit (INDEPENDENT_AMBULATORY_CARE_PROVIDER_SITE_OTHER): Payer: BLUE CROSS/BLUE SHIELD

## 2017-08-15 DIAGNOSIS — R6882 Decreased libido: Secondary | ICD-10-CM

## 2017-08-15 DIAGNOSIS — R5383 Other fatigue: Secondary | ICD-10-CM

## 2017-08-15 DIAGNOSIS — R195 Other fecal abnormalities: Secondary | ICD-10-CM

## 2017-08-15 DIAGNOSIS — E559 Vitamin D deficiency, unspecified: Secondary | ICD-10-CM

## 2017-08-15 LAB — TESTOSTERONE: Testosterone: 308.27 ng/dL (ref 300.00–890.00)

## 2017-08-15 LAB — COMPREHENSIVE METABOLIC PANEL
ALT: 52 U/L (ref 0–53)
AST: 49 U/L — ABNORMAL HIGH (ref 0–37)
Albumin: 4 g/dL (ref 3.5–5.2)
Alkaline Phosphatase: 124 U/L — ABNORMAL HIGH (ref 39–117)
BUN: 13 mg/dL (ref 6–23)
CO2: 27 meq/L (ref 19–32)
Calcium: 8.9 mg/dL (ref 8.4–10.5)
Chloride: 106 mEq/L (ref 96–112)
Creatinine, Ser: 0.82 mg/dL (ref 0.40–1.50)
GFR: 101.41 mL/min (ref >60.00)
GLUCOSE: 85 mg/dL (ref 70–99)
POTASSIUM: 4 meq/L (ref 3.5–5.1)
Sodium: 139 mEq/L (ref 135–145)
Total Bilirubin: 0.5 mg/dL (ref 0.2–1.2)
Total Protein: 6.6 g/dL (ref 6.0–8.3)

## 2017-08-15 LAB — CBC
HCT: 46.1 % (ref 39.0–52.0)
HEMOGLOBIN: 15.5 g/dL (ref 13.0–17.0)
MCHC: 33.6 g/dL (ref 30.0–36.0)
MCV: 92.9 fl (ref 78.0–100.0)
PLATELETS: 250 10*3/uL (ref 150.0–400.0)
RBC: 4.97 Mil/uL (ref 4.22–5.81)
RDW: 14.9 % (ref 11.5–15.5)
WBC: 5.7 10*3/uL (ref 4.0–10.5)

## 2017-08-15 LAB — LIPID PANEL
CHOL/HDL RATIO: 3
Cholesterol: 147 mg/dL (ref 0–200)
HDL: 43.8 mg/dL (ref >39.00)
LDL Cholesterol: 87 mg/dL (ref 0–99)
NONHDL: 103.24
Triglycerides: 82 mg/dL (ref 0.0–149.0)
VLDL: 16.4 mg/dL (ref 0.0–40.0)

## 2017-08-15 LAB — VITAMIN D 25 HYDROXY (VIT D DEFICIENCY, FRACTURES): VITD: 9.21 ng/mL — ABNORMAL LOW (ref 30.00–100.00)

## 2017-08-15 LAB — TSH: TSH: 1.41 u[IU]/mL (ref 0.35–4.50)

## 2017-08-16 DIAGNOSIS — K603 Anal fistula: Secondary | ICD-10-CM | POA: Diagnosis not present

## 2017-08-18 ENCOUNTER — Other Ambulatory Visit: Payer: Self-pay | Admitting: Family Medicine

## 2017-08-18 LAB — TISSUE TRANSGLUTAMINASE, IGA: (tTG) Ab, IgA: 1 U/mL

## 2017-08-18 LAB — RETICULIN ANTIBODIES, IGA W TITER: Reticulin IGA Screen: NEGATIVE

## 2017-08-19 ENCOUNTER — Telehealth: Payer: Self-pay | Admitting: Family Medicine

## 2017-08-19 ENCOUNTER — Other Ambulatory Visit: Payer: Self-pay | Admitting: Family Medicine

## 2017-08-19 DIAGNOSIS — E559 Vitamin D deficiency, unspecified: Secondary | ICD-10-CM

## 2017-08-19 NOTE — Telephone Encounter (Signed)
Copied from CRM [phone]. Topic: Inquiry >> Aug 19, 2017  4:13 PM Windy KalataMichael, [name], NT wrote: Reason for CRM: patient is calling and states he needs his Testerone levels from 08/15/17 [facility] [phone]    Copied results/08/15/2017 and faxed as requested

## 2017-08-21 ENCOUNTER — Other Ambulatory Visit: Payer: Self-pay | Admitting: Surgery

## 2017-08-21 DIAGNOSIS — K603 Anal fistula: Secondary | ICD-10-CM

## 2017-08-25 ENCOUNTER — Ambulatory Visit
Admission: RE | Admit: 2017-08-25 | Discharge: 2017-08-25 | Disposition: A | Discharge status: Home / Self Care | Payer: BLUE CROSS/BLUE SHIELD | Source: Ambulatory Visit | Attending: Surgery | Admitting: Surgery

## 2017-08-25 DIAGNOSIS — K610 Anal abscess: Secondary | ICD-10-CM | POA: Diagnosis not present

## 2017-08-25 DIAGNOSIS — K603 Anal fistula: Secondary | ICD-10-CM

## 2017-08-25 MED ORDER — GADOBENATE DIMEGLUMINE 529 MG/ML IV SOLN
15.0000 mL | Freq: Once | INTRAVENOUS | Status: AC | PRN
  Administered 2017-08-25: 15 mL via INTRAVENOUS

## 2017-08-27 DIAGNOSIS — L281 Prurigo nodularis: Secondary | ICD-10-CM | POA: Diagnosis not present

## 2017-09-02 DIAGNOSIS — E291 Testicular hypofunction: Secondary | ICD-10-CM | POA: Diagnosis not present

## 2017-09-02 DIAGNOSIS — I10 Essential (primary) hypertension: Secondary | ICD-10-CM | POA: Diagnosis not present

## 2017-09-02 DIAGNOSIS — R6882 Decreased libido: Secondary | ICD-10-CM | POA: Diagnosis not present

## 2017-09-02 DIAGNOSIS — R799 Abnormal finding of blood chemistry, unspecified: Secondary | ICD-10-CM | POA: Diagnosis not present

## 2017-09-02 DIAGNOSIS — R5383 Other fatigue: Secondary | ICD-10-CM | POA: Diagnosis not present

## 2017-09-02 DIAGNOSIS — F330 Major depressive disorder, recurrent, mild: Secondary | ICD-10-CM | POA: Diagnosis not present

## 2017-09-10 ENCOUNTER — Ambulatory Visit: Payer: Self-pay | Admitting: Surgery

## 2017-09-10 DIAGNOSIS — K603 Anal fistula: Secondary | ICD-10-CM | POA: Diagnosis not present

## 2017-09-10 NOTE — H&P (Signed)
CC: Perianal discomfort - here today for f/u re:MRI  HPI: [name] is a very pleasant 62 year old gentleman with history of hypertension, hyperlipidemia, depression, anxiety, low libido referred to me by my partner Daniel [name] for consultation regarding possible recurrent anal abscess/fistula. He has a 15 year history of intermittent perianal discomfort and a knot-like swelling on his left buttock that was subsequently drained. He was seen by Daniel [name] for this and taken to the operating room 08/07/16 for EUA, excision of 2 internal/external hemorrhoidal columns as well as incision and drainage of a large peri-anal abscess and a primary fistulotomy. He developed severe pain postoperatively and reports having to take dilaudid for this. Since that procedure, his wound ultimately closed and every 2-3 months he still notes intermittent drainage from his left buttock. He denies any issues with incontinence to solid, liquid stool, or gas. 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 No evidence of secondary tracks or associated abscess.  He has had a colonoscopy with Dr. Adela [name] 07/2016 which showed: 1. hemorrhoids and perianal fistulous tract 2. Large amount of stool precluding visualization in the left and transverse colon 3. Inflammation and patchy erosions and erythema in the terminal ileum which were biopsied 4. Sigmoid/left diverticulosis  TI biopsy returned ileal mucosa with benign lymphoid aggregates, no active inflammation or granulomas Random biopsies of the colon showed unremarkable mucosa without microscopic colitis or active inflammation/granulomas  PMH: Hypertension (well controlled on oral antihypertensive); depression (controlled on oral antidepressants); anxiety (managed with Valium); hyperlipidemia (controlled with statin)  PSH: Duodenal switch 4  years ago Excision of 2 column internal/external hemorrhoid as well as I&D of perianal abscess and fistulotomy (Daniel [name], 08/07/16) Laparoscopic left inguinal hernia repair with mesh (Daniel [name] 04/10/17)  FHx: Denies FHx of malignancy  Social: Denies use of tobacco/EtOH/drugs. 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  The patient is a 62 year old male.   Allergies Daniel [name]; 09/10/2017 10:28 AM) Allergies Reconciled   Medication History Daniel [name]; 09/10/2017 10:28 AM) Vitamin E (200UNIT Tablet, Oral) Active. Zinc Gluconate (50MG  Tablet, Oral) Active. Fish Oil (300MG  Capsule, Oral) Active. Cymbalta (Oral) Specific strength unknown - Active. Finasteride (1MG  Tablet, Oral) Active. Wellbutrin SR (150MG  Tablet ER 12HR, Oral) Active. Clobetasol Propionate (0.05% Solution, External) Active. DiazePAM (5MG  Tablet, Oral) Active. DULoxetine HCl (30MG  Capsule DR Part, Oral) Active. Lisinopril (2.5MG  Tablet, Oral) Active. Bystolic (5MG  Tablet, Oral) Active. Vitamin D (Ergocalciferol) (50000UNIT Capsule, Oral) Active. Zolpidem Tartrate (10MG  Tablet, Oral) Active. Testosterone Cypionate (200MG /ML Solution, Intramuscular) Active. DULoxetine HCl (60MG  Capsule DR Part, Oral) Active. Medications Reconciled  Vitals Daniel [name](Daniel [name] RMA; 09/10/2017 10:28 AM) 09/10/2017 10:27 AM Weight: 180.8 lb Height: 73in Body Surface Area: 2.06 m Body Mass Index: 23.85 kg/m  Temp.: 98.28F  Pulse: 98 (Regular)  BP: 130/82 (Sitting, Left Arm, Standard)       Physical Exam Daniel [name]; 09/10/2017 10:55 AM) The physical exam findings are as follows: Note:Constitutional: No acute distress; conversant; no deformities Eyes: Moist conjunctiva; no lid lag; anicteric sclerae; pupils equal round and reactive to light Neck: Trachea midline; no  palpable thyromegaly Lungs: Normal respiratory effort; no tactile fremitus CV: Regular rate and rhythm; no palpable thrill; no pitting edema GI: Abdomen soft, nontender, nondistended; no palpable hepatosplenomegaly Anorectal: Left  anterior external opening. No surrounding erythema. No palpable fluctuance or signs of abscess. Somewhat decreased anal tone. MSK: Normal gait; no clubbing/cyanosis Psychiatric: Appropriate affect; alert and oriented 3 Lymphatic: No palpable cervical or axillary lymphadenopathy    Assessment & Plan Daniel Deer M. Tifini Reeder [name]; 09/10/2017 11:03 AM) ANAL FISTULA (K60.3) Impression: [name] is a very pleasant 62yoM with hx HTN, HLD, depression, anxiety, prior recurrent anal abscesses and ?subcutaneous anal fistula now s/p 2 column hemorrhoidectomy and primary fistulotomy 08/07/16 with Dr. Magnus Ivan - here today for f/u  -MRI showed no evidence of secondary fistulous tracts; an intersphincteric fistula -Will plan EUA, possible I&D, placement of cutting seton -I discussed at length with him the results of the study and our exam findings. He expressed significant concern regarding fecal incontinence. We discussed that for intersphincteric fistula's if this is what it was found to be at the time of surgery, the typical recommendation is to undergo a primary fistulotomy. Given his concerns regarding incontinence, we also discussed the use of a cutting seton. We discussed that no procedure carries a 0% risk of incontinence but that a cutting seton likely carries the lower risk relative to a fistulotomy, particularly in the setting of a prior fistulotomy. Other procedures including fibrin glue etc have been shown to be quite ineffective. We also discussed that on EUA, it may be determined his fistula is actually trans-sphincteric and if this is the case, would consider placing draining seton and further procedures at a later date. We discussed other options including no surgery at all  which would result in persistent fistula and potentially recurrent anal abscesses. -The anatomy and physiology of the anal canal was discussed at length with the patient today. The pathophysiology of anal fistula and abscess was discussed at length with associated pictures and illustrations. -The planned procedure, material risks (including, but not limited to, pain, bleeding, infection, scarring, need for blood transfusion, damage to anal sphincter, incontinence of gas and/or stool, need for additional procedures, recurrence of fistula, pneumonia, heart attack, stroke, death) benefits and alternatives to surgery were discussed at length. The patient's questions were answered to their satisfaction, he voiced understanding and elected to proceed with surgery. Additionally, we discussed typical postoperative expectations and the recovery process. We discussed the uncomfortable 4-6wk interval after surgery typically seen with a cutting seton in expectations therein. We discussed limiting narcotic analgesia as is  Signed electronically by Andria Meuse, [name] (09/10/2017 11:03 AM)

## 2017-09-17 ENCOUNTER — Other Ambulatory Visit: Payer: Self-pay | Admitting: Family Medicine

## 2017-09-30 DIAGNOSIS — R220 Localized swelling, mass and lump, head: Secondary | ICD-10-CM | POA: Diagnosis not present

## 2017-09-30 DIAGNOSIS — L989 Disorder of the skin and subcutaneous tissue, unspecified: Secondary | ICD-10-CM | POA: Diagnosis not present

## 2017-09-30 DIAGNOSIS — L281 Prurigo nodularis: Secondary | ICD-10-CM | POA: Diagnosis not present

## 2017-10-07 ENCOUNTER — Ambulatory Visit: Payer: Self-pay | Admitting: Surgery

## 2017-10-07 DIAGNOSIS — K603 Anal fistula: Secondary | ICD-10-CM | POA: Diagnosis not present

## 2017-10-07 DIAGNOSIS — R1900 Intra-abdominal and pelvic swelling, mass and lump, unspecified site: Secondary | ICD-10-CM | POA: Diagnosis not present

## 2017-10-07 NOTE — H&P (Signed)
CC: Anal fistula, recurrent  HPI: Daniel [name] is a very pleasant 62 year old gentleman with history of HTN, HLD, depression, anxiety, low libido referred to me by my partner Dr. Magnus Ivan for consultation regarding possible recurrent anal abscess/fistula. He has a 15 year history of intermittent perianal discomfort and a knot-like swelling on his left buttock that was subsequently drained. He was seen by Dr. Magnus Ivan for this and taken to the operating room 08/07/16 for EUA, excision of 2 internal/external hemorrhoidal columns as well as incision and drainage of a large peri-anal abscess and a primary fistulotomy. He developed severe pain postoperatively and reports having to take dilaudid for this. Since that procedure, his wound ultimately closed and every 2-3 months he still notes intermittent drainage from his left buttock. He previously denied any issues with incontinence to solid, liquid stool, or gas.  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 No evidence of secondary tracks or associated abscess.  He has had a colonoscopy with Dr. Adela Lank 07/2016 which showed: 1. hemorrhoids and perianal fistulous tract 2. Large amount of stool precluding visualization in the left and transverse colon 3. Inflammation and patchy erosions and erythema in the terminal ileum which were biopsied 4. Sigmoid/left diverticulosis  TI biopsy returned ileal mucosa with benign lymphoid aggregates, no active inflammation or granulomas Random biopsies of the colon showed unremarkable mucosa without microscopic colitis or active inflammation/granulomas  INTERVAL HX He is scheduled for surgery 10/14/17. He returns today for follow-up for further discussion regarding surgery. He has baseline incontinence to gas occasionally but not liquid or solid stool. 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 We discussed options going forward including observation versus additional surgery. He is extremely concerned about worsening incontinence and states this would be lifestyle limiting. He wants to avoid any procedures that could worsen his continence and has therefore inquired about fibrin glue. He still has issues with intermittent boil/drainage - last was a week ago. Responds to PO abx which he has been doing for 15 years.  Today, he also brought up with me fact that he has a left lower abdominal bulge that he is concerned could be a recurrent hernia. He notices this bulges out every strains with bowel movements. He denies any episodes of incarceration.    PMH: Hypertension (well controlled on oral antihypertensive); depression (controlled on oral antidepressants); anxiety (managed with Valium); hyperlipidemia (controlled with statin)  PSH: Duodenal switch 4 years ago Excision of 2 column internal/external hemorrhoid as well as I&D of perianal abscess and fistulotomy (Dr. Magnus Ivan, 08/07/16) Laparoscopic left inguinal hernia repair with mesh (Dr. Magnus Ivan 04/10/17)  FHx: Denies FHx of malignancy  Social: Denies use of tobacco/EtOH/drugs. 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  The patient is a 63 year old male.   Allergies Judithann Sauger, [place]; 10/07/2017 8:55 AM) No Known Drug Allergies [05/19/2015]: No Known Allergies [09/21/2016]: Allergies Reconciled   Medication History Judithann Sauger, [place]; 10/07/2017 8:55 AM) Testosterone Cypionate (200MG /ML Solution, Intramuscular) Active. DULoxetine HCl (60MG  Capsule DR Part, Oral) Active. Vitamin E (200UNIT Tablet, Oral) Active. Zinc Gluconate (50MG  Tablet, Oral) Active. Fish Oil (300MG  Capsule, Oral) Active. Cymbalta (Oral) Specific strength unknown - Active. Finasteride (1MG  Tablet, Oral)  Active. Wellbutrin SR (150MG  Tablet ER 12HR, Oral) Active. Clobetasol Propionate (0.05% Solution, External)  Active. DiazePAM (5MG  Tablet, Oral) Active. DULoxetine HCl (30MG  Capsule DR Part, Oral) Active. Lisinopril (2.5MG  Tablet, Oral) Active. Bystolic (5MG  Tablet, Oral) Active. Vitamin D (Ergocalciferol) (50000UNIT Capsule, Oral) Active. Zolpidem Tartrate (10MG  Tablet, Oral) Active. Medications Reconciled    Review of Systems Cristal Deer M. Obe Ahlers MD; 10/07/2017 9:35 AM) General Not Present- Chills and Fever. HEENT Not Present- Blurred Vision and Double Vision. Neck Not Present- Neck Pain and Neck Swelling. Respiratory Not Present- Decreased Exercise Tolerance and Difficulty Breathing on Exertion. Cardiovascular Not Present- Chest Pain and Difficulty Breathing Lying Down. Gastrointestinal Present- Constipation. Not Present- Abdominal Pain. Musculoskeletal Not Present- Decreased Range of Motion and Muscle Weakness. Neurological Not Present- Decreased Memory and Difficulty Speaking. Psychiatric Present- Anxiety and Delusions. Endocrine Present- Libido Change and Sexual Dysfunction. Hematology Not Present- Blood Clots and Blood Thinners.  Vitals Elease Hashimoto King RMA; 10/07/2017 8:54 AM) 10/07/2017 8:54 AM Weight: 187.8 lb Height: 73in Body Surface Area: 2.09 m Body Mass Index: 24.78 kg/m  Temp.: 98.84F  Pulse: 97 (Regular)  BP: 125/78 (Sitting, Left Arm, Standard)       Physical Exam Cristal Deer M. Noni Stonesifer MD; 10/07/2017 9:36 AM) The physical exam findings are as follows: Note:Constitutional: No acute distress; conversant; no deformities Eyes: Moist conjunctiva; no lid lag; anicteric sclerae; pupils equal round and reactive to light Neck: Trachea midline; no palpable thyromegaly Lungs: Normal respiratory effort; no tactile fremitus CV: Regular rate and rhythm; no palpable thrill; no pitting edema GI: Abdomen soft, nontender, nondistended; no palpable  hepatosplenomegaly. Left groin-palpable defect but nothing protruding through it. There is a bulge higher up in the inguinal canal potentially at the level of the internal ring. Anorectal: Deferred today; undergoing EUA next week MSK: Normal gait; no clubbing/cyanosis Psychiatric: Appropriate affect; alert and oriented 3 Lymphatic: No palpable cervical or axillary lymphadenopathy    Assessment & Plan Cristal Deer M. Aliena Ghrist MD; 10/07/2017 9:40 AM) ANAL FISTULA (K60.3) Story: Mr. Fentress is a very pleasant 62yoM with hx HTN, HLD, depression, anxiety, prior recurrent anal abscesses and ?subcutaneous anal fistula now s/p 2 column hemorrhoidectomy and primary fistulotomy 08/07/16 with Dr. Magnus Ivan - here today for f/u Impression: -MRI showed no evidence of secondary fistulous tracts; an intersphincteric fistula. We discussed options going forward including observation which carries a near 0% risk of worsening incontinence. We also discussed cutting setons and potentially seton placement if transsphincteric. He is highly concerned about worsening function and has opted to pursue procedures that carry the lowest risk of this. We also discussed doing nothing at all. He opted to pursue injection of fibrin glue. We discussed the low probability of ultimate success but he would like [place] to give it a try. Should it recur, he would plan ongoing observation and prn abx for recurrent abscesses. -The anatomy and physiology of the anal canal was discussed at length with the patient today. The pathophysiology of anal fistula and abscess was discussed at length with associated pictures and illustrations. -The planned procedure, material risks (including, but not limited to, pain, bleeding, infection, scarring, damage to anal sphincter although quite low with glue injection, incontinence of gas and/or stool, need for additional procedures, recurrence of fistula and abscesses, pneumonia, heart attack, stroke, death) benefits and  alternatives to surgery were discussed at length. The patient's questions were answered to his satisfaction, he voiced understanding and elected to proceed with surgery. Additionally, we discussed typical postoperative expectations and the recovery process. ABDOMINAL WALL BULGE (R19.00) Story: Recurrent bulge of left abdominal wall - prior laparoscopic inguinal hernia repair 72mo  ago Impression: -Will obtain CT a/p with contrast to further evaluate  Signed electronically by Andria Meusehristopher M Hosam Mcfetridge, MD (10/07/2017 9:40 AM)

## 2017-10-09 ENCOUNTER — Encounter (HOSPITAL_BASED_OUTPATIENT_CLINIC_OR_DEPARTMENT_OTHER): Payer: Self-pay

## 2017-10-10 ENCOUNTER — Other Ambulatory Visit: Payer: Self-pay

## 2017-10-10 ENCOUNTER — Encounter (HOSPITAL_BASED_OUTPATIENT_CLINIC_OR_DEPARTMENT_OTHER): Payer: Self-pay | Admitting: *Deleted

## 2017-10-10 ENCOUNTER — Encounter (HOSPITAL_COMMUNITY)
Admission: RE | Admit: 2017-10-10 | Discharge: 2017-10-10 | Disposition: A | Discharge status: Home / Self Care | Payer: BLUE CROSS/BLUE SHIELD | Source: Ambulatory Visit | Attending: Surgery | Admitting: Surgery

## 2017-10-10 DIAGNOSIS — Z01812 Encounter for preprocedural laboratory examination: Secondary | ICD-10-CM | POA: Diagnosis not present

## 2017-10-10 DIAGNOSIS — Z01818 Encounter for other preprocedural examination: Secondary | ICD-10-CM | POA: Diagnosis not present

## 2017-10-10 LAB — COMPREHENSIVE METABOLIC PANEL
ALBUMIN: 3.6 g/dL (ref 3.5–5.0)
ALK PHOS: 129 U/L — AB (ref 38–126)
ALT: 28 U/L (ref 0–44)
AST: 28 U/L (ref 15–41)
Anion gap: 8 (ref 5–15)
BILIRUBIN TOTAL: 0.8 mg/dL (ref 0.3–1.2)
BUN: 13 mg/dL (ref 8–23)
CALCIUM: 8.5 mg/dL — AB (ref 8.9–10.3)
CO2: 26 mmol/L (ref 22–32)
Chloride: 110 mmol/L (ref 98–111)
Creatinine, Ser: 0.91 mg/dL (ref 0.61–1.24)
GFR calc Af Amer: >60 mL/min (ref >60)
GFR calc non Af Amer: >60 mL/min (ref >60)
Glucose, Bld: 111 mg/dL — ABNORMAL HIGH (ref 70–99)
Potassium: 3.9 mmol/L (ref 3.5–5.1)
Sodium: 144 mmol/L (ref 135–145)
TOTAL PROTEIN: 6.1 g/dL — AB (ref 6.5–8.1)

## 2017-10-10 LAB — CBC WITH DIFFERENTIAL/PLATELET
BASOS ABS: 0 10*3/uL (ref 0.0–0.1)
BASOS PCT: 1 %
Eosinophils Absolute: 0.4 10*3/uL (ref 0.0–0.7)
Eosinophils Relative: 6 %
HEMATOCRIT: 42.2 % (ref 39.0–52.0)
HEMOGLOBIN: 14.1 g/dL (ref 13.0–17.0)
Lymphocytes Relative: 25 %
Lymphs Abs: 1.5 10*3/uL (ref 0.7–4.0)
MCH: 30.6 pg (ref 26.0–34.0)
MCHC: 33.4 g/dL (ref 30.0–36.0)
MCV: 91.5 fL (ref 78.0–100.0)
Monocytes Absolute: 0.4 10*3/uL (ref 0.1–1.0)
Monocytes Relative: 7 %
NEUTROS ABS: 3.8 10*3/uL (ref 1.7–7.7)
NEUTROS PCT: 61 %
Platelets: 241 10*3/uL (ref 150–400)
RBC: 4.61 MIL/uL (ref 4.22–5.81)
RDW: 14.6 % (ref 11.5–15.5)
WBC: 6.2 10*3/uL (ref 4.0–10.5)

## 2017-10-10 MED ORDER — CHLORHEXIDINE GLUCONATE CLOTH 2 % EX PADS
6.0000 | MEDICATED_PAD | Freq: Once | CUTANEOUS | Status: DC
  Filled 2017-10-10: qty 6

## 2017-10-10 NOTE — Progress Notes (Signed)
Spoke w/ pt via phone for pre-op interview.  Npo after mn.   Arrive at 0530.  Getting CBCdiff and cmet done today.  Current ekg in chart and epic.  Will take am meds w/ exception no lisinopril .

## 2017-10-11 ENCOUNTER — Other Ambulatory Visit: Payer: Self-pay | Admitting: Surgery

## 2017-10-11 DIAGNOSIS — K603 Anal fistula: Secondary | ICD-10-CM

## 2017-10-13 ENCOUNTER — Other Ambulatory Visit: Payer: Self-pay | Admitting: Family

## 2017-10-14 ENCOUNTER — Encounter (HOSPITAL_BASED_OUTPATIENT_CLINIC_OR_DEPARTMENT_OTHER)
Admission: RE | Disposition: A | Discharge status: Home / Self Care | Payer: Self-pay | Source: Ambulatory Visit | Attending: Surgery

## 2017-10-14 ENCOUNTER — Ambulatory Visit (HOSPITAL_BASED_OUTPATIENT_CLINIC_OR_DEPARTMENT_OTHER)
Admission: RE | Admit: 2017-10-14 | Discharge: 2017-10-14 | Disposition: A | Discharge status: Home / Self Care | Payer: BLUE CROSS/BLUE SHIELD | Source: Ambulatory Visit | Attending: Surgery | Admitting: Surgery

## 2017-10-14 ENCOUNTER — Other Ambulatory Visit: Payer: Self-pay

## 2017-10-14 ENCOUNTER — Ambulatory Visit (HOSPITAL_BASED_OUTPATIENT_CLINIC_OR_DEPARTMENT_OTHER): Payer: BLUE CROSS/BLUE SHIELD | Admitting: Anesthesiology

## 2017-10-14 ENCOUNTER — Encounter (HOSPITAL_BASED_OUTPATIENT_CLINIC_OR_DEPARTMENT_OTHER): Payer: Self-pay | Admitting: General Practice

## 2017-10-14 DIAGNOSIS — F329 Major depressive disorder, single episode, unspecified: Secondary | ICD-10-CM | POA: Insufficient documentation

## 2017-10-14 DIAGNOSIS — R6882 Decreased libido: Secondary | ICD-10-CM | POA: Diagnosis not present

## 2017-10-14 DIAGNOSIS — F419 Anxiety disorder, unspecified: Secondary | ICD-10-CM | POA: Insufficient documentation

## 2017-10-14 DIAGNOSIS — K603 Anal fistula: Secondary | ICD-10-CM | POA: Diagnosis not present

## 2017-10-14 DIAGNOSIS — I10 Essential (primary) hypertension: Secondary | ICD-10-CM | POA: Insufficient documentation

## 2017-10-14 DIAGNOSIS — K644 Residual hemorrhoidal skin tags: Secondary | ICD-10-CM | POA: Insufficient documentation

## 2017-10-14 DIAGNOSIS — E785 Hyperlipidemia, unspecified: Secondary | ICD-10-CM | POA: Diagnosis not present

## 2017-10-14 DIAGNOSIS — Z79899 Other long term (current) drug therapy: Secondary | ICD-10-CM | POA: Insufficient documentation

## 2017-10-14 DIAGNOSIS — E119 Type 2 diabetes mellitus without complications: Secondary | ICD-10-CM | POA: Diagnosis not present

## 2017-10-14 DIAGNOSIS — J449 Chronic obstructive pulmonary disease, unspecified: Secondary | ICD-10-CM | POA: Insufficient documentation

## 2017-10-14 DIAGNOSIS — G4733 Obstructive sleep apnea (adult) (pediatric): Secondary | ICD-10-CM | POA: Diagnosis not present

## 2017-10-14 DIAGNOSIS — F17200 Nicotine dependence, unspecified, uncomplicated: Secondary | ICD-10-CM | POA: Insufficient documentation

## 2017-10-14 HISTORY — DX: Major depressive disorder, single episode, unspecified: F32.9

## 2017-10-14 HISTORY — DX: Localized edema: R60.0

## 2017-10-14 HISTORY — DX: Generalized anxiety disorder: F41.1

## 2017-10-14 HISTORY — DX: Obstructive sleep apnea (adult) (pediatric): G47.33

## 2017-10-14 HISTORY — DX: Male erectile dysfunction, unspecified: N52.9

## 2017-10-14 HISTORY — DX: Personal history of colon polyps, unspecified: Z86.0100

## 2017-10-14 HISTORY — PX: RECTAL EXAM UNDER ANESTHESIA: SHX6399

## 2017-10-14 HISTORY — DX: Presence of spectacles and contact lenses: Z97.3

## 2017-10-14 HISTORY — PX: INCISION AND DRAINAGE ABSCESS: SHX5864

## 2017-10-14 HISTORY — DX: Benign prostatic hyperplasia with lower urinary tract symptoms: N40.1

## 2017-10-14 HISTORY — DX: Vitamin D deficiency, unspecified: E55.9

## 2017-10-14 HISTORY — DX: Personal history of other diseases of the respiratory system: Z87.09

## 2017-10-14 HISTORY — DX: Anal fistula, unspecified: K60.30

## 2017-10-14 HISTORY — DX: Emphysema, unspecified: J43.9

## 2017-10-14 HISTORY — DX: Personal history of colonic polyps: Z86.010

## 2017-10-14 HISTORY — DX: Personal history of other endocrine, nutritional and metabolic disease: Z86.39

## 2017-10-14 HISTORY — DX: Anal fistula: K60.3

## 2017-10-14 HISTORY — DX: Diverticulosis of large intestine without perforation or abscess without bleeding: K57.30

## 2017-10-14 SURGERY — EXAM UNDER ANESTHESIA, RECTUM
Anesthesia: General

## 2017-10-14 MED ORDER — SUGAMMADEX SODIUM 200 MG/2ML IV SOLN
INTRAVENOUS | Status: DC | PRN
  Administered 2017-10-14: 200 mg via INTRAVENOUS

## 2017-10-14 MED ORDER — PROMETHAZINE HCL 25 MG/ML IJ SOLN
6.2500 mg | INTRAMUSCULAR | Status: DC | PRN
  Filled 2017-10-14: qty 1

## 2017-10-14 MED ORDER — SUGAMMADEX SODIUM 200 MG/2ML IV SOLN
INTRAVENOUS | Status: AC
Start: 2017-10-14 — End: ?
  Filled 2017-10-14: qty 2

## 2017-10-14 MED ORDER — LACTATED RINGERS IV SOLN
INTRAVENOUS | Status: DC
  Administered 2017-10-14: 06:00:00 via INTRAVENOUS
  Filled 2017-10-14: qty 1000

## 2017-10-14 MED ORDER — DEXAMETHASONE SODIUM PHOSPHATE 10 MG/ML IJ SOLN
INTRAMUSCULAR | Status: AC
  Filled 2017-10-14: qty 1

## 2017-10-14 MED ORDER — PROPOFOL 10 MG/ML IV BOLUS
INTRAVENOUS | Status: AC
  Filled 2017-10-14: qty 40

## 2017-10-14 MED ORDER — CELECOXIB 200 MG PO CAPS
ORAL_CAPSULE | ORAL | Status: AC
  Filled 2017-10-14: qty 1

## 2017-10-14 MED ORDER — NEBIVOLOL HCL 5 MG PO TABS
5.0000 mg | ORAL_TABLET | Freq: Once | ORAL | Status: AC
Start: 2017-10-14 — End: 2017-10-14
  Administered 2017-10-14: 5 mg via ORAL
  Filled 2017-10-14 (×2): qty 1

## 2017-10-14 MED ORDER — EVICEL 5 ML EX KIT
PACK | Freq: Once | CUTANEOUS | Status: DC
  Filled 2017-10-14 (×2): qty 1

## 2017-10-14 MED ORDER — GABAPENTIN 300 MG PO CAPS
300.0000 mg | ORAL_CAPSULE | ORAL | Status: AC
  Administered 2017-10-14: 300 mg via ORAL
  Filled 2017-10-14: qty 1

## 2017-10-14 MED ORDER — MIDAZOLAM HCL 2 MG/2ML IJ SOLN
INTRAMUSCULAR | Status: DC | PRN
  Administered 2017-10-14: 2 mg via INTRAVENOUS

## 2017-10-14 MED ORDER — HYDROGEN PEROXIDE 3 % EX SOLN
CUTANEOUS | Status: DC | PRN
  Administered 2017-10-14: 1

## 2017-10-14 MED ORDER — ACETAMINOPHEN 500 MG PO TABS
1000.0000 mg | ORAL_TABLET | ORAL | Status: AC
  Administered 2017-10-14: 1000 mg via ORAL
  Filled 2017-10-14: qty 2

## 2017-10-14 MED ORDER — ONDANSETRON HCL 4 MG/2ML IJ SOLN
INTRAMUSCULAR | Status: AC
  Filled 2017-10-14: qty 2

## 2017-10-14 MED ORDER — SUGAMMADEX SODIUM 200 MG/2ML IV SOLN
INTRAVENOUS | Status: AC
  Filled 2017-10-14: qty 2

## 2017-10-14 MED ORDER — MIDAZOLAM HCL 2 MG/2ML IJ SOLN
INTRAMUSCULAR | Status: AC
  Filled 2017-10-14: qty 2

## 2017-10-14 MED ORDER — BUPIVACAINE LIPOSOME 1.3 % IJ SUSP
INTRAMUSCULAR | Status: DC | PRN
  Administered 2017-10-14: 20 mL

## 2017-10-14 MED ORDER — CHLORHEXIDINE GLUCONATE CLOTH 2 % EX PADS
6.0000 | MEDICATED_PAD | Freq: Once | CUTANEOUS | Status: DC
  Filled 2017-10-14: qty 6

## 2017-10-14 MED ORDER — FENTANYL CITRATE (PF) 100 MCG/2ML IJ SOLN
INTRAMUSCULAR | Status: AC
  Filled 2017-10-14: qty 2

## 2017-10-14 MED ORDER — ONDANSETRON HCL 4 MG/2ML IJ SOLN
INTRAMUSCULAR | Status: DC | PRN
  Administered 2017-10-14: 4 mg via INTRAVENOUS

## 2017-10-14 MED ORDER — ARTIFICIAL TEARS OPHTHALMIC OINT
TOPICAL_OINTMENT | OPHTHALMIC | Status: AC
  Filled 2017-10-14: qty 3.5

## 2017-10-14 MED ORDER — BUPIVACAINE-EPINEPHRINE 0.25% -1:200000 IJ SOLN
INTRAMUSCULAR | Status: DC | PRN
  Administered 2017-10-14: 30 mL

## 2017-10-14 MED ORDER — FENTANYL CITRATE (PF) 100 MCG/2ML IJ SOLN
INTRAMUSCULAR | Status: DC | PRN
  Administered 2017-10-14: 100 ug via INTRAVENOUS

## 2017-10-14 MED ORDER — BUPIVACAINE LIPOSOME 1.3 % IJ SUSP
20.0000 mL | Freq: Once | INTRAMUSCULAR | Status: DC
  Filled 2017-10-14: qty 20

## 2017-10-14 MED ORDER — PHENYLEPHRINE HCL 10 MG/ML IJ SOLN
INTRAMUSCULAR | Status: DC | PRN
  Administered 2017-10-14: 80 ug via INTRAVENOUS

## 2017-10-14 MED ORDER — CELECOXIB 200 MG PO CAPS
200.0000 mg | ORAL_CAPSULE | ORAL | Status: AC
  Administered 2017-10-14: 200 mg via ORAL
  Filled 2017-10-14: qty 1

## 2017-10-14 MED ORDER — PROPOFOL 10 MG/ML IV BOLUS
INTRAVENOUS | Status: DC | PRN
  Administered 2017-10-14: 200 mg via INTRAVENOUS

## 2017-10-14 MED ORDER — GABAPENTIN 300 MG PO CAPS
ORAL_CAPSULE | ORAL | Status: AC
  Filled 2017-10-14: qty 1

## 2017-10-14 MED ORDER — ROCURONIUM BROMIDE 10 MG/ML (PF) SYRINGE
PREFILLED_SYRINGE | INTRAVENOUS | Status: DC | PRN
  Administered 2017-10-14: 50 mg via INTRAVENOUS

## 2017-10-14 MED ORDER — LIDOCAINE 2% (20 MG/ML) 5 ML SYRINGE
INTRAMUSCULAR | Status: AC
  Filled 2017-10-14: qty 5

## 2017-10-14 MED ORDER — TRAMADOL HCL 50 MG PO TABS: 50.0000 mg | ORAL_TABLET | Freq: Four times a day (QID) | ORAL | 0 refills | Status: AC | PRN

## 2017-10-14 MED ORDER — PHENYLEPHRINE 40 MCG/ML (10ML) SYRINGE FOR IV PUSH (FOR BLOOD PRESSURE SUPPORT)
PREFILLED_SYRINGE | INTRAVENOUS | Status: AC
  Filled 2017-10-14: qty 10

## 2017-10-14 MED ORDER — KETOROLAC TROMETHAMINE 30 MG/ML IJ SOLN
INTRAMUSCULAR | Status: DC | PRN
  Administered 2017-10-14: 30 mg via INTRAVENOUS

## 2017-10-14 MED ORDER — HYDROMORPHONE HCL 1 MG/ML IJ SOLN
0.2500 mg | INTRAMUSCULAR | Status: DC | PRN
  Filled 2017-10-14: qty 0.5

## 2017-10-14 MED ORDER — ACETAMINOPHEN 500 MG PO TABS
ORAL_TABLET | ORAL | Status: AC
  Filled 2017-10-14: qty 2

## 2017-10-14 MED ORDER — ROCURONIUM BROMIDE 100 MG/10ML IV SOLN
INTRAVENOUS | Status: AC
  Filled 2017-10-14: qty 1

## 2017-10-14 MED ORDER — LIDOCAINE 2% (20 MG/ML) 5 ML SYRINGE
INTRAMUSCULAR | Status: DC | PRN
  Administered 2017-10-14: 100 mg via INTRAVENOUS

## 2017-10-14 MED ORDER — DEXAMETHASONE SODIUM PHOSPHATE 10 MG/ML IJ SOLN
INTRAMUSCULAR | Status: DC | PRN
  Administered 2017-10-14: 10 mg via INTRAVENOUS

## 2017-10-14 SURGICAL SUPPLY — 53 items
APL SKNCLS STERI-STRIP NONHPOA (GAUZE/BANDAGES/DRESSINGS) ×2
BENZOIN TINCTURE PRP APPL 2/3 (GAUZE/BANDAGES/DRESSINGS) ×4 IMPLANT
BLADE EXTENDED COATED 6.5IN (ELECTRODE) IMPLANT
BLADE HEX COATED 2.75 (ELECTRODE) ×2 IMPLANT
BLADE SURG 10 STRL SS (BLADE) IMPLANT
BLADE SURG 15 STRL LF DISP TIS (BLADE) IMPLANT
BLADE SURG 15 STRL SS (BLADE)
BRIEF STRETCH FOR OB PAD LRG (UNDERPADS AND DIAPERS) ×4 IMPLANT
CANISTER SUCT 3000ML PPV (MISCELLANEOUS) ×2 IMPLANT
COVER BACK TABLE 60X90IN (DRAPES) ×2 IMPLANT
COVER MAYO STAND STRL (DRAPES) ×2 IMPLANT
DECANTER SPIKE VIAL GLASS SM (MISCELLANEOUS) ×2 IMPLANT
DRAPE LAPAROTOMY 100X72 PEDS (DRAPES) ×2 IMPLANT
DRAPE UTILITY XL STRL (DRAPES) ×2 IMPLANT
DRSG PAD ABDOMINAL 8X10 ST (GAUZE/BANDAGES/DRESSINGS) ×1 IMPLANT
EVICEL ×1 IMPLANT
GAUZE SPONGE 4X4 12PLY STRL (GAUZE/BANDAGES/DRESSINGS) ×1 IMPLANT
GAUZE SPONGE 4X4 12PLY STRL LF (GAUZE/BANDAGES/DRESSINGS) ×2 IMPLANT
GAUZE SPONGE 4X4 16PLY XRAY LF (GAUZE/BANDAGES/DRESSINGS) IMPLANT
GLOVE BIO SURGEON STRL SZ7.5 (GLOVE) ×2 IMPLANT
GLOVE INDICATOR 8.0 STRL GRN (GLOVE) ×2 IMPLANT
GOWN STRL REUS W/TWL LRG LVL3 (GOWN DISPOSABLE) ×2 IMPLANT
HYDROGEN PEROXIDE 16OZ (MISCELLANEOUS) ×2 IMPLANT
KIT TURNOVER CYSTO (KITS) ×2 IMPLANT
LOOP VESSEL MAXI BLUE (MISCELLANEOUS) IMPLANT
NDL SAFETY ECLIPSE 18X1.5 (NEEDLE) IMPLANT
NEEDLE HYPO 18GX1.5 SHARP (NEEDLE)
NEEDLE HYPO 22GX1.5 SAFETY (NEEDLE) ×2 IMPLANT
NS IRRIG 500ML POUR BTL (IV SOLUTION) ×2 IMPLANT
PACK BASIN DAY SURGERY FS (CUSTOM PROCEDURE TRAY) ×2 IMPLANT
PAD ABD 8X10 STRL (GAUZE/BANDAGES/DRESSINGS) ×2 IMPLANT
PAD ARMBOARD 7.5X6 YLW CONV (MISCELLANEOUS) IMPLANT
PENCIL BUTTON HOLSTER BLD 10FT (ELECTRODE) ×2 IMPLANT
SPONGE HEMORRHOID 8X3CM (HEMOSTASIS) IMPLANT
SPONGE SURGIFOAM ABS GEL 12-7 (HEMOSTASIS) IMPLANT
SUCTION FRAZIER HANDLE 10FR (MISCELLANEOUS)
SUCTION TUBE FRAZIER 10FR DISP (MISCELLANEOUS) IMPLANT
SUT CHROMIC 2 0 SH (SUTURE) IMPLANT
SUT CHROMIC 3 0 SH 27 (SUTURE) IMPLANT
SUT ETHIBOND 0 (SUTURE) IMPLANT
SUT MNCRL AB 4-0 PS2 18 (SUTURE) IMPLANT
SUT SILK 2 0 (SUTURE)
SUT SILK 2-0 18XBRD TIE 12 (SUTURE) IMPLANT
SUT VIC AB 2-0 SH 27 (SUTURE)
SUT VIC AB 2-0 SH 27XBRD (SUTURE) IMPLANT
SUT VIC AB 3-0 SH 18 (SUTURE) IMPLANT
SUT VIC AB 4-0 P-3 18XBRD (SUTURE) IMPLANT
SUT VIC AB 4-0 P3 18 (SUTURE)
SYR CONTROL 10ML LL (SYRINGE) ×2 IMPLANT
TOWEL OR 17X24 6PK STRL BLUE (TOWEL DISPOSABLE) ×2 IMPLANT
TRAY DSU PREP LF (CUSTOM PROCEDURE TRAY) ×2 IMPLANT
TUBE CONNECTING 12X1/4 (SUCTIONS) ×2 IMPLANT
YANKAUER SUCT BULB TIP NO VENT (SUCTIONS) ×2 IMPLANT

## 2017-10-14 NOTE — Discharge Instructions (Addendum)
ANORECTAL SURGERY: POST OP INSTRUCTIONS  1. DIET: Follow a light bland diet the first 24 hours after arrival home, such as soup, liquids, crackers, etc.  Be sure to include lots of fluids daily.  Avoid fast food or heavy meals as your are more likely to get nauseated.  Eat a low fat diet the next few days after surgery.    2. Take your usually prescribed home medications unless otherwise directed.  3. PAIN CONTROL: a. It is helpful to take an over-the-counter pain medication regularly for the first few days/weeks.  Choose from the following that works best for you: i. Ibuprofen (Advil, etc) Three 200mg  tabs every 6 hours as needed. ii. Acetaminophen (Tylenol, etc) 500-650mg  every 6 hours as needed iii. NOTE: You may take both of these medications together - most patients find it most helpful when alternating between the two. iv.  Next dose  Tylenol  or Ibuprofen 12 noon as needed.  4. Avoid getting constipated.  Following surgery, it is common to experience some constipation.  Increasing fluid intake (64oz of water per day) and taking a fiber supplement (such as Metamucil, Citrucel, FiberCon) 1-2 times a day regularly will usually help prevent this problem from occurring.  Take Miralax (over the counter) 1-2x/day while taking a narcotic pain medication. If no bowel movement after 48hours, you may additionally take a laxative like a bottle of Milk of Magnesia which can be purchased over the counter. Avoid enemas if possible as these are often painful.   5. Watch out for diarrhea.  If you have many loose bowel movements, simplify your diet to bland foods.  Stop any stool softeners and decrease your fiber supplement. If this worsens or does not improve, please call us.  6. Wash / shower every day.  If you were discharged with a dressing, you may remove this the day after your surgery or sooner if you have a bowel movement. You may shower normally, getting soap/water on your wound, particularly after  bowel movements.  7. Soaking in a warm bath filled a couple inches ("Sitz bath") is a great way to clean the area after a bowel movement and many patients find it is a way to soothe the area.  8. ACTIVITIES as tolerated:   a. You may resume regular (light) daily activities beginning the next day--such as daily self-care, walking, climbing stairs--gradually increasing activities as tolerated.  If you can walk 30 minutes without difficulty, it is safe to try more intense activity such as jogging, treadmill, bicycling, low-impact aerobics, etc. b. Avoid activities that make your pain worse c. You may drive when you can safely maneuver your car and apply brakes and are not taking any pain medication.  9. FOLLOW UP in our office a. Please call [facility] at [phone] to set up an appointment to see your surgeon in the office for a follow-up appointment approximately ~4 weeks after your surgery. b. Make sure that you call for this appointment the day you arrive home to insure a convenient appointment time.  9. If you have disability or family leave forms that need to be completed, you may have them completed by your primary care physician's office; for return to work instructions, please ask our office staff and they will be happy to assist you in obtaining this documentation   When to call us [phone]: 1. Poor pain control 2. Reactions / problems with new medications (rash/itching, etc)  3. Fever over 101.5 F (38.5 C) 4. Inability to  urinate 5. Nausea/vomiting 6. Worsening swelling or bruising 7. Continued bleeding from incision. 8. Increased pain, redness, or drainage from the incision  The clinic staff is available to answer your questions during regular business hours (8:30am-5pm).  Please dont hesitate to call and ask to speak to one of our nurses for clinical concerns.   A surgeon from [facility] is always on call at the hospitals   If you have a medical emergency, go to  the nearest emergency room or call 911.   [facility] [address], [address], [place], [place]  [address] ? MAIN: [phone] FAX [phone] [email or web address]     Post Anesthesia Home Care Instructions  Activity: Get plenty of rest for the remainder of the day. A responsible individual must stay with you for 24 hours following the procedure.  For the next 24 hours, DO NOT: -Drive a car -Advertising copywriterperate machinery -Drink alcoholic beverages -Take any medication unless instructed by your physician -Make any legal decisions or sign important papers.  Meals: Start with liquid foods such as gelatin or soup. Progress to regular foods as tolerated. Avoid greasy, spicy, heavy foods. If nausea and/or vomiting occur, drink only clear liquids until the nausea and/or vomiting subsides. Call your physician if vomiting continues.  Special Instructions/Symptoms: Your throat may feel dry or sore from the anesthesia or the breathing tube placed in your throat during surgery. If this causes discomfort, gargle with warm salt water. The discomfort should disappear within 24 hours.  If you had a scopolamine patch placed behind your ear for the management of post- operative nausea and/or vomiting:  1. The medication in the patch is effective for 72 hours, after which it should be removed.  Wrap patch in a tissue and discard in the trash. Wash hands thoroughly with soap and water. 2. You may remove the patch earlier than 72 hours if you experience unpleasant side effects which may include dry mouth, dizziness or visual disturbances. 3. Avoid touching the patch. Wash your hands with soap and water after contact with the patch.    Post Anesthesia Home Care Instructions  Activity: Get plenty of rest for the remainder of the day. A responsible individual must stay with you for 24 hours following the procedure.  For the next 24 hours, DO NOT: -Drive a car -Social workerperate  machinery -Drink alcoholic beverages -Take any medication unless instructed by your physician -Make any legal decisions or sign important papers.  Meals: Start with liquid foods such as gelatin or soup. Progress to regular foods as tolerated. Avoid greasy, spicy, heavy foods. If nausea and/or vomiting occur, drink only clear liquids until the nausea and/or vomiting subsides. Call your physician if vomiting continues.  Special Instructions/Symptoms: Your throat may feel dry or sore from the anesthesia or the breathing tube placed in your throat during surgery. If this causes discomfort, gargle with warm salt water. The discomfort should disappear within 24 hours.  If you had a scopolamine patch placed behind your ear for the management of post- operative nausea and/or vomiting:  1. The medication in the patch is effective for 72 hours, after which it should be removed.  Wrap patch in a tissue and discard in the trash. Wash hands thoroughly with soap and water. 2. You may remove the patch earlier than 72 hours if you experience unpleasant side effects which may include dry mouth, dizziness or visual disturbances. 3. Avoid touching the patch. Wash your hands with soap and water after  patch. °  ° ° °

## 2017-10-14 NOTE — Op Note (Signed)
10/14/2017  8:33 AM  PATIENT:  Daniel [name]  62 y.o. male  Patient Care Team: Sharlene Dory, DO as PCP - General (Family Medicine) Portenier, Annabelle Harman, MD as Referring Physician (Surgery) Nita Sells, MD as Consulting Physician (Dermatology)  PRE-OPERATIVE DIAGNOSIS:  RECURRENT ANAL FISTULA  POST-OPERATIVE DIAGNOSIS:  Chronic anal sinus cavity  PROCEDURE:   1. Anal exam under anesthesia 2. Incision/drainage of perianal abscess cavity 3. Injection of fibrin glue into cavity  SURGEON:  Surgeon(s): Andria Meuse, MD  ASSISTANT: Hedda Slade, PA-C   ANESTHESIA:   general  SPECIMEN:  No Specimen  DISPOSITION OF SPECIMEN:  N/A  COUNTS: Sponge, needle, and instrument counts were reported correct x2  PLAN OF CARE: Discharge to home after PACU  PATIENT DISPOSITION:  PACU - hemodynamically stable.  INDICATION: Daniel [name] Is a very pleasant 62 year old gentleman with history of hypertension, hyperlipidemia, depression, anxiety, low libido he was referred to me by my partner, Dr. Magnus Ivan, for consultation regarding possible recurrent anal abscess/fistula.  He has a 15-year history of intermittent perianal discomfort and not like swelling of his left buttock that was subsequently drained.  He was seen by Dr. Magnus Ivan for this and taken to the operating room 08/07/16 for EUA, excision of 2 internal/external hemorrhoidal columns as well as incision and drainage of a large perianal abscess and a primary fistulotomy.  He developed severe pain postoperatively requiring oral Dilaudid for control.  Since that procedure, his wound ultimately closed and following this every 2 to 3 months he still notes intermittent drainage from his left buttock.  He previously denied any issues with incontinence to solid liquid stool or gas.  I saw him in the office and given his complaints, discussed with him options going forward.  We opted to obtain a MRI pelvis to delineate fistula anatomy if present.   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  No evidence of secondary tracts or associated abscess.  His last colonoscopy was with Dr. Adela Lank 07/2016 and notable for hemorrhoids and a perianal fistulous tract as well as a large amount of stool precluding visualization in the left and transverse colon.  Inflammation and patchy erosions in the erythema and TI which were biopsied but returned benign without active inflammation or granulomas.  Random biopsies of the colon showed unremarkable mucosa without microscopic colitis or active inflammation/granulomas.  Given all of this and his MRI findings, options were discussed.  Please refer to H&P for details regarding this discussion.  He opted to undergo EUA and injection of fibrin glue as he was quite concerned about worsening incontinence.  He did note incontinence to gas and occasionally to liquid stool at my last office visit.  OR FINDINGS: External opening left posterior- underlying cavity was not currently communicating with the skin but was opened with electrocautery.  A fistulous/chronic cavity was identified and gently probed multiple times with a fistula probe.  No communication with the internal portion of the anal canal was identified.  Circumferential inspection of the anal canal with a Hill-Ferguson retractor did not demonstrate any abnormal findings nor internal openings.  The sinus cavity was injected with hydrogen peroxide and no internal opening was identified.  The chronic cavity was gently curetted and sealed with fibrin glue (Evicel).  DESCRIPTION: The patient was identified in the preoperative holding area and taken to the OR where they were placed on the operating room  table. SCDs were placed. General endotracheal anesthesia was induced without difficulty. The patient was then positioned in prone jackknife position with  buttocks gently taped apart. Pressure points were padded and then verified. The patient was then prepped and draped in usual sterile fashion.  A surgical timeout was performed indicating the correct patient, procedure, positioning.  A rectal block was performed using a dilute mixture of 0.25% marcaine with epinepherine + Exparel.  A well lubricated digital rectal exam was performed and no abnormal findings noted. A Hill Ferguson retractor was utilized to perform cirumferential anoscopy. No abnormal findings noted. No internal opening was visualized.  A small skin tag associated with the external opening was identified in the left posterior position corresponding to the location on MRI. There was a palpable cavity beneath this. The opening was widened to allow a fistula probe to be passed. Multiple gentle passes were performed in an attempt to identify an internal opening. None was found. Scar from his prior fistulotomy was seen and the probe terminated in this scar but without mucosal defect.  An 18-gauge Angiocath was then used to inject hydrogen peroxide into the cavity.  No communication with the anal canal was present.  Given these findings, it's likely he actually has a chronic cavity but no underlying fistulous communication into the anal canal. The cavity was gently curetted.  The cavity was then injected with 8cc of fibrin glue (Evicel).  4 x 4 gauze were then placed over the wound as well as an ABD.  Mesh underwear were then used to secure the dressing.  The patient was then taken out of the prone jackknife position, transferred to a stretcher, awakened from anesthesia, extubated, and transferred to PACU in satisfactory condition.  DISPOSITION: PACU in satisfactory condition.

## 2017-10-14 NOTE — Transfer of Care (Signed)
Immediate Anesthesia Transfer of Care Note  Patient: Daniel ClearRobert M [name]  Procedure(s) Performed: ANAL EXAM UNDER ANESTHESIA, INJECTION OF FIBRIN GLUE (N/A ) POSSIBLE INCISION AND DRAINAGE OF ANAL ABSCESS (N/A )  Patient Location: PACU  Anesthesia Type:General  Level of Consciousness: awake, alert , oriented and patient cooperative  Airway & Oxygen Therapy: Patient Spontanous Breathing and Patient connected to nasal cannula oxygen  Post-op Assessment: Report given to RN and Post -op Vital signs reviewed and stable  Post vital signs: Reviewed and stable  Last Vitals:  Vitals Value Taken Time  BP    Temp    Pulse 81 10/14/2017  8:44 AM  Resp    SpO2 100 % 10/14/2017  8:44 AM  Vitals shown include unvalidated device data.  Last Pain:  Vitals:   10/14/17 0550  TempSrc:   PainSc: 0-No pain      Patients Stated Pain Goal: 2 (10/14/17 0550)  Complications: No apparent anesthesia complications

## 2017-10-14 NOTE — H&P (Signed)
INTERVAL H&P  H&P reviewed by myself with the patient. H&P dated 10/07/17 in chart - patient reports no interval changes in his health or history. He is ready for the procedure today. Denies any complaints.  Vitals:   10/10/17 1143 10/14/17 0532 10/14/17 0550  BP:  (!) 134/91   Pulse:  72   Resp:  16   Temp:  98.7 F (37.1 C)   TempSrc:  Oral   SpO2:  99%   Weight: 81.2 kg (179 lb) 83.2 kg (183 lb 8 oz)   Height: 6\' 1"  (1.854 m)  6\' 1"  (1.854 m)   NAD, comfortable RRR Abd soft, NT/ND  A/P  Daniel [name] is a very pleasant 62yoM with hx HTN, HLD, depression, anxiety, prior recurrent anal abscesses and what was reported as subcutaneous anal fistula now s/p 2 column hemorrhoidectomy and primary fistulotomy 08/07/16 with Dr. Magnus IvanBlackman - here today for surgery for recurrent anal fistula  -MRI showed no evidence of secondary fistulous tracts; an intersphincteric fistula. We discussed options going forward including observation which carries a near 0% risk of worsening incontinence. We also discussed cutting setons and potentially seton placement if transsphincteric. He is highly concerned about worsening function and has opted to pursue procedures that carry the lowest risk of incontinence. We also discussed doing nothing at all. He opted to pursue injection of fibrin glue. We discussed the low probability of ultimate success but he would like us to give it a try. Should it recur, he would plan ongoing observation and prn abx for recurrent abscesses. -The planned procedure, material risks (including, but not limited to, pain, bleeding, infection, scarring, damage to anal sphincter although quite low with glue injection, incontinence of gas and/or stool, need for additional procedures, recurrence of fistula and abscesses, pneumonia, heart attack, stroke, death) benefits and alternatives to surgery were discussed at length. The patient's questions were answered to his satisfaction, he voiced understanding  and elected to proceed with surgery. Additionally, we discussed typical postoperative expectations and the recovery process.  Daniel [name], M.D. General and Colorectal Surgery [facility]

## 2017-10-14 NOTE — Anesthesia Postprocedure Evaluation (Signed)
Anesthesia Post Note  Patient: Daniel ClearRobert M [name]  Procedure(s) Performed: ANAL EXAM UNDER ANESTHESIA, INJECTION OF FIBRIN GLUE (N/A ) POSSIBLE INCISION AND DRAINAGE OF ANAL ABSCESS (N/A )     Patient location during evaluation: PACU Anesthesia Type: General Level of consciousness: sedated Pain management: pain level controlled Vital Signs Assessment: post-procedure vital signs reviewed and stable Respiratory status: spontaneous breathing and respiratory function stable Cardiovascular status: stable Postop Assessment: no apparent nausea or vomiting Anesthetic complications: no    Last Vitals:  Vitals:   10/14/17 0900 10/14/17 0915  BP: 129/84 132/79  Pulse: 75 75  Resp: 14 10  Temp:    SpO2: 100% 97%    Last Pain:  Vitals:   10/14/17 0924  TempSrc:   PainSc: 0-No pain                 Rosalia Mcavoy DANIEL

## 2017-10-14 NOTE — Anesthesia Preprocedure Evaluation (Addendum)
Anesthesia Evaluation  Patient identified by MRN, date of birth, ID band Patient awake    Reviewed: Allergy & Precautions, NPO status , Patient's Chart, lab work & pertinent test results, reviewed documented beta blocker date and time   Airway Mallampati: II  TM Distance: >3 FB Neck ROM: Full    Dental no notable dental hx. (+) Dental Advisory Given, Caps,    Pulmonary COPD, Current Smoker,  Hx OSA now resolved s/p weight loss of 120 lbs   Pulmonary exam normal breath sounds clear to auscultation       Cardiovascular Exercise Tolerance: Good hypertension, Pt. on medications and Pt. on home beta blockers Normal cardiovascular exam Rhythm:Regular Rate:Normal  ECG: NSR rate 67   Neuro/Psych Anxiety Depression negative neurological ROS     GI/Hepatic negative GI ROS, Neg liver ROS, Hx "Duodenal Switch"   Endo/Other  diabetesHx DM now resolved s/p weight loss of 120 lbs  Renal/GU negative Renal ROS   BPH    Musculoskeletal  (+) Arthritis , Osteoarthritis,    Abdominal   Peds negative pediatric ROS (+)  Hematology negative hematology ROS (+)   Anesthesia Other Findings   Reproductive/Obstetrics negative OB ROS                          Anesthesia Physical  Anesthesia Plan  ASA: III  Anesthesia Plan: General   Post-op Pain Management:    Induction: Intravenous  PONV Risk Score and Plan: 3 and Ondansetron, Dexamethasone and Diphenhydramine  Airway Management Planned: Oral ETT and LMA  Additional Equipment: None  Intra-op Plan:   Post-operative Plan: Extubation in OR  Informed Consent: I have reviewed the patients History and Physical, chart, labs and discussed the procedure including the risks, benefits and alternatives for the proposed anesthesia with the patient or authorized representative who has indicated his/her understanding and acceptance.   Dental advisory given  Plan  Discussed with: CRNA and Surgeon  Anesthesia Plan Comments:         Anesthesia Quick Evaluation

## 2017-10-14 NOTE — Anesthesia Procedure Notes (Signed)
Procedure Name: Intubation Date/Time: 10/14/2017 7:49 AM Performed by: Wanita Chamberlain, CRNA Pre-anesthesia Checklist: Patient being monitored, Suction available, Emergency Drugs available, Patient identified and Timeout performed Patient Re-evaluated:Patient Re-evaluated prior to induction Oxygen Delivery Method: Circle system utilized Preoxygenation: Pre-oxygenation with 100% oxygen Induction Type: IV induction Ventilation: Mask ventilation without difficulty Laryngoscope Size: Mac and 4 Grade View: Grade I Tube type: Oral Tube size: 8.0 mm Number of attempts: 1 Airway Equipment and Method: Bite block (gauze) Placement Confirmation: ETT inserted through vocal cords under direct vision,  positive ETCO2,  CO2 detector and breath sounds checked- equal and bilateral Secured at: 23 cm Tube secured with: Tape Dental Injury: Teeth and Oropharynx as per pre-operative assessment

## 2017-10-15 ENCOUNTER — Encounter (HOSPITAL_BASED_OUTPATIENT_CLINIC_OR_DEPARTMENT_OTHER): Payer: Self-pay | Admitting: Surgery

## 2017-10-15 ENCOUNTER — Other Ambulatory Visit: Payer: Self-pay | Admitting: Family Medicine

## 2017-10-15 NOTE — Telephone Encounter (Signed)
Last diazepam RX: 05/09/17, #90 x 1 refill Last OV: 08/14/17  Next OV: none scheduled with PCP UDS:  05/17/16 CSC: not on file CSR: See NARX report in PCP's red folder.

## 2017-10-21 ENCOUNTER — Ambulatory Visit
Admission: RE | Admit: 2017-10-21 | Discharge: 2017-10-21 | Disposition: A | Discharge status: Home / Self Care | Payer: BLUE CROSS/BLUE SHIELD | Source: Ambulatory Visit | Attending: Surgery | Admitting: Surgery

## 2017-10-21 ENCOUNTER — Other Ambulatory Visit: Payer: Self-pay | Admitting: Surgery

## 2017-10-21 DIAGNOSIS — K439 Ventral hernia without obstruction or gangrene: Secondary | ICD-10-CM

## 2017-10-21 DIAGNOSIS — Z8719 Personal history of other diseases of the digestive system: Secondary | ICD-10-CM | POA: Diagnosis not present

## 2017-10-21 DIAGNOSIS — K603 Anal fistula: Secondary | ICD-10-CM

## 2017-10-21 MED ORDER — IOPAMIDOL (ISOVUE-300) INJECTION 61%
100.0000 mL | Freq: Once | INTRAVENOUS | Status: AC | PRN
  Administered 2017-10-21: 100 mL via INTRAVENOUS

## 2017-11-19 ENCOUNTER — Other Ambulatory Visit: Payer: BLUE CROSS/BLUE SHIELD

## 2017-11-20 ENCOUNTER — Other Ambulatory Visit: Payer: Self-pay | Admitting: Family Medicine

## 2017-11-20 DIAGNOSIS — E559 Vitamin D deficiency, unspecified: Secondary | ICD-10-CM

## 2017-12-02 DIAGNOSIS — R6882 Decreased libido: Secondary | ICD-10-CM | POA: Diagnosis not present

## 2017-12-06 ENCOUNTER — Other Ambulatory Visit: Payer: Self-pay | Admitting: Family Medicine

## 2017-12-06 ENCOUNTER — Telehealth: Payer: Self-pay | Admitting: Family Medicine

## 2017-12-06 DIAGNOSIS — Z0000 Encounter for general adult medical examination without abnormal findings: Secondary | ICD-10-CM

## 2017-12-06 MED ORDER — DULOXETINE HCL 30 MG PO CPEP: ORAL_CAPSULE | ORAL | 3 refills | Status: DC

## 2017-12-06 NOTE — Telephone Encounter (Signed)
Patient called in to request refill on Cymbalta. Did send in refills to [facility] on [address] [place]. Called him at [phone], [phone] and [phone] Left a detailed message on all 3 numbers we have refilled his medication as requested and did apologize for the delay in getting this refill done.

## 2017-12-24 ENCOUNTER — Other Ambulatory Visit: Payer: Self-pay | Admitting: Family Medicine

## 2017-12-24 ENCOUNTER — Encounter: Payer: Self-pay | Admitting: Plastic Surgery

## 2017-12-24 ENCOUNTER — Ambulatory Visit (INDEPENDENT_AMBULATORY_CARE_PROVIDER_SITE_OTHER): Payer: BLUE CROSS/BLUE SHIELD | Admitting: Plastic Surgery

## 2017-12-24 VITALS — BP 124/80 | HR 70 | Resp 12

## 2017-12-24 DIAGNOSIS — E118 Type 2 diabetes mellitus with unspecified complications: Secondary | ICD-10-CM | POA: Diagnosis not present

## 2017-12-24 DIAGNOSIS — L989 Disorder of the skin and subcutaneous tissue, unspecified: Secondary | ICD-10-CM | POA: Diagnosis not present

## 2017-12-24 DIAGNOSIS — E559 Vitamin D deficiency, unspecified: Secondary | ICD-10-CM

## 2017-12-24 NOTE — Progress Notes (Signed)
Subjective:    Patient ID: Daniel [name], male    DOB: 1956/01/07, 62 y.o.   MRN: [id number]  The patient is a 62 yrs old wm here for evaluation of a changing skin lesion on his scalp.  He has been suffering with a wound on the area for years.  It was biopsied in the past and found to be negative.  Since it has changed.  It is larger and sore.  The wound is red and superficial but concerning. It does not appear to be infected.  Nothing makes it better including steroid cream.     Review of Systems  Constitutional: Negative.  Negative for activity change and appetite change.  Eyes: Negative.   Respiratory: Negative.   Cardiovascular: Negative.   Genitourinary: Negative.   Musculoskeletal: Negative.   Skin: Positive for wound.  Hematological: Negative.   Psychiatric/Behavioral: Negative.        Objective:   Physical Exam  Constitutional: He is oriented to person, place, and time. He appears well-developed and well-nourished.  HENT:  Head: Normocephalic.    Right Ear: External ear normal.  Left Ear: External ear normal.  Eyes: Pupils are equal, round, and reactive to light. EOM are normal.  Cardiovascular: Normal rate.  Pulmonary/Chest: Effort normal.  Neurological: He is alert and oriented to person, place, and time.  Skin: Skin is warm.  Psychiatric: He has a normal mood and affect. His behavior is normal. Thought content normal.       Assessment & Plan:  Changing skin lesion  Type 2 diabetes mellitus with complication, without long-term current use of insulin (HCC), Chronic  Recommend excision of the changing skin lesion on the scalp with primary closure and path evaluation.

## 2017-12-26 ENCOUNTER — Other Ambulatory Visit: Payer: Self-pay | Admitting: Family Medicine

## 2017-12-26 NOTE — Telephone Encounter (Signed)
Please advise 

## 2017-12-28 ENCOUNTER — Other Ambulatory Visit: Payer: Self-pay | Admitting: Family Medicine

## 2017-12-28 DIAGNOSIS — Z0000 Encounter for general adult medical examination without abnormal findings: Secondary | ICD-10-CM

## 2017-12-30 ENCOUNTER — Other Ambulatory Visit: Payer: Self-pay | Admitting: Family Medicine

## 2018-01-01 NOTE — Telephone Encounter (Signed)
Last office visit on 08/14/2017 Last refill 4//15/2019  #30 with 5 refills

## 2018-01-08 ENCOUNTER — Ambulatory Visit (INDEPENDENT_AMBULATORY_CARE_PROVIDER_SITE_OTHER)
Admission: RE | Admit: 2018-01-08 | Discharge: 2018-01-08 | Disposition: A | Discharge status: Home / Self Care | Payer: BLUE CROSS/BLUE SHIELD | Source: Ambulatory Visit | Attending: Acute Care | Admitting: Acute Care

## 2018-01-08 DIAGNOSIS — F17210 Nicotine dependence, cigarettes, uncomplicated: Secondary | ICD-10-CM | POA: Diagnosis not present

## 2018-01-08 DIAGNOSIS — Z122 Encounter for screening for malignant neoplasm of respiratory organs: Secondary | ICD-10-CM | POA: Diagnosis not present

## 2018-01-13 ENCOUNTER — Other Ambulatory Visit: Payer: Self-pay | Admitting: Acute Care

## 2018-01-13 DIAGNOSIS — F17210 Nicotine dependence, cigarettes, uncomplicated: Secondary | ICD-10-CM

## 2018-01-13 DIAGNOSIS — Z122 Encounter for screening for malignant neoplasm of respiratory organs: Secondary | ICD-10-CM

## 2018-01-23 ENCOUNTER — Other Ambulatory Visit: Payer: Self-pay

## 2018-01-23 ENCOUNTER — Encounter (HOSPITAL_BASED_OUTPATIENT_CLINIC_OR_DEPARTMENT_OTHER): Payer: Self-pay | Admitting: *Deleted

## 2018-01-29 ENCOUNTER — Ambulatory Visit (HOSPITAL_BASED_OUTPATIENT_CLINIC_OR_DEPARTMENT_OTHER)
Admission: RE | Admit: 2018-01-29 | Discharge: 2018-01-29 | Disposition: A | Discharge status: Home / Self Care | Payer: BLUE CROSS/BLUE SHIELD | Source: Ambulatory Visit | Attending: Plastic Surgery | Admitting: Plastic Surgery

## 2018-01-29 ENCOUNTER — Encounter (HOSPITAL_BASED_OUTPATIENT_CLINIC_OR_DEPARTMENT_OTHER): Payer: Self-pay | Admitting: *Deleted

## 2018-01-29 ENCOUNTER — Ambulatory Visit (HOSPITAL_BASED_OUTPATIENT_CLINIC_OR_DEPARTMENT_OTHER): Payer: BLUE CROSS/BLUE SHIELD | Admitting: Anesthesiology

## 2018-01-29 ENCOUNTER — Encounter (HOSPITAL_BASED_OUTPATIENT_CLINIC_OR_DEPARTMENT_OTHER)
Admission: RE | Disposition: A | Discharge status: Home / Self Care | Payer: Self-pay | Source: Ambulatory Visit | Attending: Plastic Surgery

## 2018-01-29 ENCOUNTER — Other Ambulatory Visit: Payer: Self-pay

## 2018-01-29 DIAGNOSIS — I10 Essential (primary) hypertension: Secondary | ICD-10-CM | POA: Insufficient documentation

## 2018-01-29 DIAGNOSIS — Z79899 Other long term (current) drug therapy: Secondary | ICD-10-CM | POA: Insufficient documentation

## 2018-01-29 DIAGNOSIS — L280 Lichen simplex chronicus: Secondary | ICD-10-CM | POA: Insufficient documentation

## 2018-01-29 DIAGNOSIS — Z833 Family history of diabetes mellitus: Secondary | ICD-10-CM | POA: Diagnosis not present

## 2018-01-29 DIAGNOSIS — F17210 Nicotine dependence, cigarettes, uncomplicated: Secondary | ICD-10-CM | POA: Diagnosis not present

## 2018-01-29 DIAGNOSIS — E559 Vitamin D deficiency, unspecified: Secondary | ICD-10-CM | POA: Diagnosis not present

## 2018-01-29 DIAGNOSIS — R600 Localized edema: Secondary | ICD-10-CM | POA: Diagnosis not present

## 2018-01-29 DIAGNOSIS — E60 Dietary zinc deficiency: Secondary | ICD-10-CM | POA: Diagnosis not present

## 2018-01-29 DIAGNOSIS — F329 Major depressive disorder, single episode, unspecified: Secondary | ICD-10-CM | POA: Insufficient documentation

## 2018-01-29 DIAGNOSIS — Z8249 Family history of ischemic heart disease and other diseases of the circulatory system: Secondary | ICD-10-CM | POA: Insufficient documentation

## 2018-01-29 DIAGNOSIS — G4733 Obstructive sleep apnea (adult) (pediatric): Secondary | ICD-10-CM | POA: Diagnosis not present

## 2018-01-29 DIAGNOSIS — K5790 Diverticulosis of intestine, part unspecified, without perforation or abscess without bleeding: Secondary | ICD-10-CM | POA: Insufficient documentation

## 2018-01-29 DIAGNOSIS — Z841 Family history of disorders of kidney and ureter: Secondary | ICD-10-CM | POA: Diagnosis not present

## 2018-01-29 DIAGNOSIS — E785 Hyperlipidemia, unspecified: Secondary | ICD-10-CM | POA: Diagnosis not present

## 2018-01-29 DIAGNOSIS — E119 Type 2 diabetes mellitus without complications: Secondary | ICD-10-CM | POA: Diagnosis not present

## 2018-01-29 DIAGNOSIS — Z809 Family history of malignant neoplasm, unspecified: Secondary | ICD-10-CM | POA: Diagnosis not present

## 2018-01-29 DIAGNOSIS — J449 Chronic obstructive pulmonary disease, unspecified: Secondary | ICD-10-CM | POA: Insufficient documentation

## 2018-01-29 DIAGNOSIS — Z823 Family history of stroke: Secondary | ICD-10-CM | POA: Diagnosis not present

## 2018-01-29 DIAGNOSIS — Z86010 Personal history of colonic polyps: Secondary | ICD-10-CM | POA: Diagnosis not present

## 2018-01-29 DIAGNOSIS — M1990 Unspecified osteoarthritis, unspecified site: Secondary | ICD-10-CM | POA: Diagnosis not present

## 2018-01-29 DIAGNOSIS — F411 Generalized anxiety disorder: Secondary | ICD-10-CM | POA: Diagnosis not present

## 2018-01-29 DIAGNOSIS — L988 Other specified disorders of the skin and subcutaneous tissue: Secondary | ICD-10-CM | POA: Diagnosis not present

## 2018-01-29 DIAGNOSIS — Z8041 Family history of malignant neoplasm of ovary: Secondary | ICD-10-CM | POA: Insufficient documentation

## 2018-01-29 DIAGNOSIS — L859 Epidermal thickening, unspecified: Secondary | ICD-10-CM | POA: Diagnosis not present

## 2018-01-29 DIAGNOSIS — L989 Disorder of the skin and subcutaneous tissue, unspecified: Secondary | ICD-10-CM

## 2018-01-29 HISTORY — PX: MASS EXCISION: SHX2000

## 2018-01-29 SURGERY — EXCISION MASS
Anesthesia: General | Site: Head

## 2018-01-29 MED ORDER — HYDROCODONE-ACETAMINOPHEN 5-325 MG PO TABS: 1.0000 | ORAL_TABLET | Freq: Four times a day (QID) | ORAL | 0 refills | Status: AC | PRN

## 2018-01-29 MED ORDER — LIDOCAINE HCL (CARDIAC) PF 100 MG/5ML IV SOSY
PREFILLED_SYRINGE | INTRAVENOUS | Status: DC | PRN
  Administered 2018-01-29: 80 mg via INTRAVENOUS

## 2018-01-29 MED ORDER — FENTANYL CITRATE (PF) 100 MCG/2ML IJ SOLN
INTRAMUSCULAR | Status: AC
  Filled 2018-01-29: qty 2

## 2018-01-29 MED ORDER — CEFAZOLIN SODIUM-DEXTROSE 2-4 GM/100ML-% IV SOLN
INTRAVENOUS | Status: AC
  Filled 2018-01-29: qty 100

## 2018-01-29 MED ORDER — PROPOFOL 10 MG/ML IV BOLUS
INTRAVENOUS | Status: DC | PRN
  Administered 2018-01-29: 150 mg via INTRAVENOUS

## 2018-01-29 MED ORDER — CEFAZOLIN SODIUM-DEXTROSE 2-4 GM/100ML-% IV SOLN
2.0000 g | INTRAVENOUS | Status: AC
  Administered 2018-01-29: 2 g via INTRAVENOUS

## 2018-01-29 MED ORDER — MIDAZOLAM HCL 2 MG/2ML IJ SOLN
1.0000 mg | INTRAMUSCULAR | Status: DC | PRN
  Administered 2018-01-29: 2 mg via INTRAVENOUS

## 2018-01-29 MED ORDER — LACTATED RINGERS IV SOLN
INTRAVENOUS | Status: DC
  Administered 2018-01-29: 11:00:00 via INTRAVENOUS

## 2018-01-29 MED ORDER — DEXAMETHASONE SODIUM PHOSPHATE 4 MG/ML IJ SOLN
INTRAMUSCULAR | Status: DC | PRN
  Administered 2018-01-29: 10 mg via INTRAVENOUS

## 2018-01-29 MED ORDER — HYDROCODONE-ACETAMINOPHEN 5-325 MG PO TABS: 1.0000 | ORAL_TABLET | Freq: Four times a day (QID) | ORAL | 0 refills | Status: DC | PRN

## 2018-01-29 MED ORDER — ONDANSETRON HCL 4 MG/2ML IJ SOLN
4.0000 mg | Freq: Once | INTRAMUSCULAR | Status: AC | PRN
  Administered 2018-01-29: 4 mg via INTRAVENOUS

## 2018-01-29 MED ORDER — EPHEDRINE SULFATE 50 MG/ML IJ SOLN
INTRAMUSCULAR | Status: DC | PRN
  Administered 2018-01-29 (×2): 10 mg via INTRAVENOUS

## 2018-01-29 MED ORDER — BUPIVACAINE-EPINEPHRINE 0.25% -1:200000 IJ SOLN
INTRAMUSCULAR | Status: DC | PRN
  Administered 2018-01-29: 4 mL

## 2018-01-29 MED ORDER — SCOPOLAMINE 1 MG/3DAYS TD PT72: 1.0000 | MEDICATED_PATCH | Freq: Once | TRANSDERMAL | Status: DC | PRN

## 2018-01-29 MED ORDER — FENTANYL CITRATE (PF) 100 MCG/2ML IJ SOLN: 25.0000 ug | INTRAMUSCULAR | Status: DC | PRN

## 2018-01-29 MED ORDER — FENTANYL CITRATE (PF) 100 MCG/2ML IJ SOLN
50.0000 ug | INTRAMUSCULAR | Status: DC | PRN
  Administered 2018-01-29: 50 ug via INTRAVENOUS
  Administered 2018-01-29: 100 ug via INTRAVENOUS

## 2018-01-29 MED ORDER — MIDAZOLAM HCL 2 MG/2ML IJ SOLN
INTRAMUSCULAR | Status: AC
  Filled 2018-01-29: qty 2

## 2018-01-29 SURGICAL SUPPLY — 74 items
ADH SKN CLS APL DERMABOND .7 (GAUZE/BANDAGES/DRESSINGS)
APL SKNCLS STERI-STRIP NONHPOA (GAUZE/BANDAGES/DRESSINGS)
BENZOIN TINCTURE PRP APPL 2/3 (GAUZE/BANDAGES/DRESSINGS) IMPLANT
BLADE CLIPPER SURG (BLADE) ×2 IMPLANT
BLADE SURG 15 STRL LF DISP TIS (BLADE) ×1 IMPLANT
BLADE SURG 15 STRL SS (BLADE) ×3
BNDG CONFORM 2 STRL LF (GAUZE/BANDAGES/DRESSINGS) IMPLANT
BNDG ELASTIC 2X5.8 VLCR STR LF (GAUZE/BANDAGES/DRESSINGS) IMPLANT
CANISTER SUCT 1200ML W/VALVE (MISCELLANEOUS) IMPLANT
CHLORAPREP W/TINT 26ML (MISCELLANEOUS) IMPLANT
CLEANER CAUTERY TIP 5X5 PAD (MISCELLANEOUS) IMPLANT
CLOSURE WOUND 1/2 X4 (GAUZE/BANDAGES/DRESSINGS)
CORD BIPOLAR FORCEPS 12FT (ELECTRODE) IMPLANT
COVER BACK TABLE 60X90IN (DRAPES) ×3 IMPLANT
COVER MAYO STAND STRL (DRAPES) ×3 IMPLANT
COVER WAND RF STERILE (DRAPES) IMPLANT
DECANTER SPIKE VIAL GLASS SM (MISCELLANEOUS) IMPLANT
DERMABOND ADVANCED (GAUZE/BANDAGES/DRESSINGS)
DERMABOND ADVANCED .7 DNX12 (GAUZE/BANDAGES/DRESSINGS) IMPLANT
DRAPE LAPAROTOMY 100X72 PEDS (DRAPES) IMPLANT
DRAPE U-SHAPE 76X120 STRL (DRAPES) ×3 IMPLANT
DRSG TEGADERM 2-3/8X2-3/4 SM (GAUZE/BANDAGES/DRESSINGS) IMPLANT
DRSG TEGADERM 4X4.75 (GAUZE/BANDAGES/DRESSINGS) IMPLANT
ELECT COATED BLADE 2.86 ST (ELECTRODE) ×2 IMPLANT
ELECT NDL BLADE 2-5/6 (NEEDLE) IMPLANT
ELECT NEEDLE BLADE 2-5/6 (NEEDLE) IMPLANT
ELECT REM PT RETURN 9FT ADLT (ELECTROSURGICAL) ×3
ELECT REM PT RETURN 9FT PED (ELECTROSURGICAL)
ELECTRODE REM PT RETRN 9FT PED (ELECTROSURGICAL) IMPLANT
ELECTRODE REM PT RTRN 9FT ADLT (ELECTROSURGICAL) IMPLANT
GAUZE SPONGE 4X4 12PLY STRL LF (GAUZE/BANDAGES/DRESSINGS) IMPLANT
GLOVE BIO SURGEON STRL SZ 6.5 (GLOVE) ×4 IMPLANT
GLOVE BIO SURGEON STRL SZ7 (GLOVE) ×2 IMPLANT
GLOVE BIO SURGEONS STRL SZ 6.5 (GLOVE) ×2
GLOVE BIOGEL PI IND STRL 7.0 (GLOVE) IMPLANT
GLOVE BIOGEL PI INDICATOR 7.0 (GLOVE) ×2
GOWN STRL REUS W/ TWL LRG LVL3 (GOWN DISPOSABLE) ×2 IMPLANT
GOWN STRL REUS W/TWL LRG LVL3 (GOWN DISPOSABLE) ×6
NDL HYPO 30GX1 BEV (NEEDLE) IMPLANT
NDL PRECISIONGLIDE 27X1.5 (NEEDLE) ×1 IMPLANT
NEEDLE HYPO 30GX1 BEV (NEEDLE) IMPLANT
NEEDLE PRECISIONGLIDE 27X1.5 (NEEDLE) ×3 IMPLANT
NS IRRIG 1000ML POUR BTL (IV SOLUTION) ×2 IMPLANT
PACK BASIN DAY SURGERY FS (CUSTOM PROCEDURE TRAY) ×3 IMPLANT
PAD CLEANER CAUTERY TIP 5X5 (MISCELLANEOUS)
PENCIL BUTTON HOLSTER BLD 10FT (ELECTRODE) ×2 IMPLANT
RUBBERBAND STERILE (MISCELLANEOUS) IMPLANT
SHEET MEDIUM DRAPE 40X70 STRL (DRAPES) IMPLANT
SLEEVE SCD COMPRESS KNEE MED (MISCELLANEOUS) ×2 IMPLANT
SPONGE GAUZE 2X2 8PLY STER LF (GAUZE/BANDAGES/DRESSINGS)
SPONGE GAUZE 2X2 8PLY STRL LF (GAUZE/BANDAGES/DRESSINGS) IMPLANT
STRIP CLOSURE SKIN 1/2X4 (GAUZE/BANDAGES/DRESSINGS) IMPLANT
SUCTION FRAZIER HANDLE 10FR (MISCELLANEOUS)
SUCTION TUBE FRAZIER 10FR DISP (MISCELLANEOUS) IMPLANT
SUT MNCRL 6-0 UNDY P1 1X18 (SUTURE) IMPLANT
SUT MNCRL AB 3-0 PS2 18 (SUTURE) IMPLANT
SUT MNCRL AB 4-0 PS2 18 (SUTURE) ×2 IMPLANT
SUT MON AB 5-0 P3 18 (SUTURE) IMPLANT
SUT MON AB 5-0 PS2 18 (SUTURE) IMPLANT
SUT MONOCRYL 6-0 P1 1X18 (SUTURE)
SUT PROLENE 5 0 P 3 (SUTURE) IMPLANT
SUT PROLENE 5 0 PS 2 (SUTURE) IMPLANT
SUT PROLENE 6 0 P 1 18 (SUTURE) IMPLANT
SUT SILK 4 0 P 3 (SUTURE) ×2 IMPLANT
SUT VIC AB 5-0 P-3 18X BRD (SUTURE) IMPLANT
SUT VIC AB 5-0 P3 18 (SUTURE)
SUT VIC AB 5-0 PS2 18 (SUTURE) IMPLANT
SUT VICRYL 4-0 PS2 18IN ABS (SUTURE) IMPLANT
SYR BULB 3OZ (MISCELLANEOUS) IMPLANT
SYR CONTROL 10ML LL (SYRINGE) ×3 IMPLANT
TOWEL GREEN STERILE FF (TOWEL DISPOSABLE) ×3 IMPLANT
TRAY DSU PREP LF (CUSTOM PROCEDURE TRAY) ×3 IMPLANT
TUBE CONNECTING 20'X1/4 (TUBING)
TUBE CONNECTING 20X1/4 (TUBING) IMPLANT

## 2018-01-29 NOTE — Anesthesia Postprocedure Evaluation (Signed)
Anesthesia Post Note  Patient: Future M SwaimAudie Clear  Procedure(s) Performed: EXCISION of lesion on scalp (N/A Head)     Patient location during evaluation: PACU Anesthesia Type: General Level of consciousness: awake and alert, oriented and awake Pain management: pain level controlled Vital Signs Assessment: post-procedure vital signs reviewed and stable Respiratory status: spontaneous breathing, nonlabored ventilation and respiratory function stable Cardiovascular status: blood pressure returned to baseline and stable Postop Assessment: no apparent nausea or vomiting Anesthetic complications: no    Last Vitals:  Vitals:   01/29/18 1230 01/29/18 1245  BP: 130/71 (!) 143/90  Pulse: 81 78  Resp: 12 16  Temp:  36.9 C  SpO2: 100% 99%    Last Pain:  Vitals:   01/29/18 1245  TempSrc:   PainSc: 0-No pain                 Cecile HearingStephen Edward Turk

## 2018-01-29 NOTE — Op Note (Signed)
DATE OF OPERATION: 01/29/2018  [facility]  PREOPERATIVE DIAGNOSIS: Changing skin lesion of scalp  POSTOPERATIVE DIAGNOSIS: Same  PROCEDURE: Excision of changing skin lesion of scalp 4 x 20 mm  SURGEON: [place] Jeff Sanger Dijon Cosens, DO  ASSISTANT: Anette Riedelarmen Mayo, PA  EBL: none  CONDITION: Stable  COMPLICATIONS: None  INDICATION: The patient, Daniel MaduroRobert, is a 62 y.o. male born on 07/07/1955, is here for treatment of a changing skin lesion of the scalp that has been an issue for years without improvement.   PROCEDURE DETAILS:  The patient was seen prior to surgery and marked.  The IV antibiotics were given. The patient was taken to the operating room and given a general anesthetic. A standard time out was performed and all information was confirmed by those in the room. SCDs were placed.   The area was prepped and draped.  Local was injected and we waited 10 minutes for the local with epinepherine to take effect.  The #15 blade was used to excise the 4 x 20 mm lesion.  Undermining was done for 5 mm to gain tension free closure.  The 4-0 Monocryl was used to close the deep layer and a running stitch for the skin.  The specimen was sent to pathology.  The patient was allowed to wake up and taken to recovery room in stable condition at the end of the case. The family was notified at the end of the case.

## 2018-01-29 NOTE — H&P (Signed)
Daniel [name] is an 62 y.o. male.   Chief Complaint: changing skin lesion scalp HPI: The patient is a 62 yrs old wm here for treatment of a changing skin lesion of his scalp.  He complains of the area being tender and sore for the past several years.  He has tried everything the dermatologist has given him.  Nothing makes it go away.  Past Medical History:  Diagnosis Date  . Anal fistula    recurrent  . Benign localized prostatic hyperplasia with lower urinary tract symptoms (LUTS)   . Bilateral lower extremity edema    feet  . COPD with emphysema (HCC)   . Diverticulosis of colon   . DOE (dyspnea on exertion)   . ED (erectile dysfunction)   . Generalized anxiety disorder   . Hemorrhoids   . History of colon polyps   . History of diabetes mellitus, type II    10-10-2017  per pt no issues after wt loss surgery   . History of pneumothorax    1983 & 1988  right spontaneous, treated w/ chest tube  . Hyperlipidemia   . Hypertension   . MDD (major depressive disorder)   . OSA (obstructive sleep apnea)    10-10-2017  not used cpap since 2016 after wt loss surgery 2015  . Status post biliopancreatic diversion with duodenal switch    07-30-2013   @ [facility] for wt. loss surgery  . Vitamin D deficiency   . Wears glasses   . Zinc deficiency 04/27/2016    Past Surgical History:  Procedure Laterality Date  . ANAL FISTULOTOMY N/A 08/07/2016   Procedure: ANAL FISTULOTOMY;  Surgeon: Abigail MiyamotoBlackman, Douglas, MD;  [facility];  Service: General;  Laterality: N/A;  . CHEST TUBE INSERTION  1988   right lung for spontaneous pneumothorax  . COLONOSCOPY W/ BIOPSIES  08/02/2016  . EXOSTECTECTOMY TOE Left 04/25/2017   Procedure: TARSAL EXOSTECTOMY OF MEDIAL CUNEIFORM AND EXOSTECTOMY OF THE FIRST METATARSAL;  Surgeon: Felecia ShellingEvans, Brent M, DPM;  Location: [facility] OR;  Service: Podiatry;  Laterality: Left;  Marland Kitchen. GASTROPLASTY DUODENAL SWITCH  07-30-2013    @[facility]    "wt loss surgery not gastric bypass surgery"  (biliopancreatic diversion w/ duodenal switch)  . HEMORRHOID SURGERY N/A 08/07/2016   Procedure: HEMORRHOIDECTOMY;  Surgeon: Abigail MiyamotoBlackman, Douglas, MD;  [facility];  Service: General;  Laterality: N/A;  . INCISION AND DRAINAGE ABSCESS N/A 10/14/2017   Procedure: POSSIBLE INCISION AND DRAINAGE OF ANAL ABSCESS;  Surgeon: Andria MeuseWhite, Christopher M, MD;  Location: [facility];  Service: General;  Laterality: N/A;  . INGUINAL HERNIA REPAIR Left 04/10/2017   Procedure: LAPAROSCOPIC LEFT INGUINAL HERNIA REPAIR WITH MESH;  Surgeon: Abigail MiyamotoBlackman, Douglas, MD;  Location: [facility];  Service: General;  Laterality: Left;  . INSERTION OF MESH Left 04/10/2017   Procedure: INSERTION OF MESH;  Surgeon: Abigail MiyamotoBlackman, Douglas, MD;  Location: [facility];  Service: General;  Laterality: Left;  . IRRIGATION AND DEBRIDEMENT SEBACEOUS CYST     back  . RECTAL EXAM UNDER ANESTHESIA N/A 10/14/2017   Procedure: ANAL EXAM UNDER ANESTHESIA, INJECTION OF FIBRIN GLUE;  Surgeon: Andria MeuseWhite, Christopher M, MD;  Location: [facility];  Service: General;  Laterality: N/A;    Family History  Problem Relation Age of Onset  . Cancer Mother        ovarian  . Hyperlipidemia Father   . Hypertension Father   . Heart disease Father        MI  7/19  . Emphysema Father   . Pulmonary disease Father   . Heart disease Maternal Grandmother   . Heart disease Maternal Grandfather   . Stroke Maternal Grandfather   . Heart disease Paternal Grandmother   . Angina Paternal Grandmother   . Heart disease Paternal Grandfather   . Stroke Paternal Grandfather   . Diabetes Maternal Uncle   . Kidney disease Maternal Uncle   . Diabetes Paternal Uncle   . Cancer Paternal Uncle    Social History:  reports that he has been smoking cigarettes. He has a 25.00 pack-year smoking history. He has never used smokeless tobacco. He reports that he drinks alcohol. He reports that he does not use drugs.  Allergies:  No Known Allergies  Medications Prior to Admission  Medication Sig Dispense Refill  . buPROPion (WELLBUTRIN XL) 150 MG 24 hr tablet TAKE 1 TABLET BY MOUTH THREE TIMES A DAY 270 tablet 1  . BYSTOLIC 5 MG tablet TAKE 1 TABLET BY MOUTH TWICE A DAY 60 tablet 2  . Calcium Carb-Cholecalciferol (CALCIUM PLUS VITAMIN D3) 600-500 MG-UNIT CAPS Take 1 tablet by mouth daily.    . Cholecalciferol (VITAMIN D3) 1000 units CAPS Take 1,000 Units by mouth daily.    . clobetasol (TEMOVATE) 0.05 % external solution Apply 1 application topically daily as needed (itching). USE TO SCALP AS DIRECTED 50 mL 1  . diazepam (VALIUM) 5 MG tablet TAKE 2 TABLETS EVERY 8 HOURS AS NEEDED FOR ANXIETY 90 tablet 1  . DULoxetine (CYMBALTA) 30 MG capsule TAKE 3 CAPSULES BY MOUTH EVERY DAY 270 capsule 2  . finasteride (PROPECIA) 1 MG tablet Take 1 tablet (1 mg total) by mouth daily. (Patient taking differently: Take 1 mg by mouth every morning. ) 30 tablet 11  . lisinopril (PRINIVIL,ZESTRIL) 2.5 MG tablet TAKE 1 TABLET (2.5 MG TOTAL) BY MOUTH DAILY. (Patient taking differently: TAKE 1 TABLET (2.5 MG TOTAL) BY MOUTH DAILY.--- takes in am) 30 tablet 5  . Multiple Vitamins-Minerals (CENTRUM SILVER) tablet Take 2 tablets by mouth daily.    . Omega-3 300 MG CAPS Take 300 mg by mouth daily.    Marland Kitchen VITAMIN A PO Take 2,400 Units by mouth daily.    . Vitamin D, Ergocalciferol, (DRISDOL) 50000 units CAPS capsule TAKE 1 CAPSULE (50,000 UNITS TOTAL) BY MOUTH 2 (TWO) TIMES A WEEK. 8 capsule 4  . vitamin E 200 UNIT capsule Take 200 Units by mouth daily.    Marland Kitchen zinc gluconate 50 MG tablet Take 100 mg by mouth daily.    Marland Kitchen zolpidem (AMBIEN) 10 MG tablet TAKE 1 TABLET BY MOUTH DAILY AT BEDTIME 30 tablet 5  . sildenafil (VIAGRA) 100 MG tablet TAKE ONE-HALF TO 1 TABLET BY MOUTH DAILY AS NEEDED FOR ERECTILE DYSFUNCTION 8 tablet 5  . testosterone cypionate (DEPOTESTOTERONE CYPIONATE) 100 MG/ML injection Inject 200 mg into the muscle every 14 (fourteen) days.  For IM use only      No results found for this or any previous visit (from the past 48 hour(s)). No results found.  Review of Systems  Constitutional: Negative.   HENT: Negative.   Eyes: Negative.   Respiratory: Negative.   Cardiovascular: Negative.   Gastrointestinal: Negative.   Genitourinary: Negative.   Musculoskeletal: Negative.   Skin: Negative.   Neurological: Negative.   Psychiatric/Behavioral: Negative.     Blood pressure 132/80, pulse (!) 58, temperature 98.5 F (36.9 C), temperature source Oral, resp. rate 18, height 6\' 1"  (1.854 m), weight 83 kg, SpO2 97 %.  Physical Exam  Constitutional: He is oriented to person, place, and time. He appears well-developed and well-nourished.  HENT:  Head: Normocephalic.    Eyes: Pupils are equal, round, and reactive to light. EOM are normal.  Cardiovascular: Normal rate.  Respiratory: Effort normal.  Neurological: He is alert and oriented to person, place, and time.  Skin: Skin is warm. No erythema.  Psychiatric: He has a normal mood and affect. His behavior is normal. Judgment and thought content normal.     Assessment/Plan Recommend excision of the changing skin lesion of the scalp with path evaluation.  Alena Bills Dillingham, DO 01/29/2018, 11:17 AM

## 2018-01-29 NOTE — Addendum Note (Signed)
Addended by: Peggye FormILLINGHAM, Dennie Vecchio S on: 01/29/2018 11:16 AM   Modules accepted: Orders

## 2018-01-29 NOTE — Addendum Note (Signed)
Addended by: Peggye FormILLINGHAM, Kaysen Deal S on: 01/29/2018 11:15 AM   Modules accepted: Orders

## 2018-01-29 NOTE — Anesthesia Preprocedure Evaluation (Addendum)
Anesthesia Evaluation  Patient identified by MRN, date of birth, ID band Patient awake    Reviewed: Allergy & Precautions, NPO status , Patient's Chart, lab work & pertinent test results, reviewed documented beta blocker date and time   Airway Mallampati: II  TM Distance: <3 FB Neck ROM: Full    Dental  (+) Dental Advisory Given, Caps, Missing   Pulmonary sleep apnea , COPD, Current Smoker,    Pulmonary exam normal breath sounds clear to auscultation       Cardiovascular hypertension, Pt. on home beta blockers and Pt. on medications (-) angina(-) Past MI Normal cardiovascular exam Rhythm:Regular Rate:Normal     Neuro/Psych PSYCHIATRIC DISORDERS Anxiety Depression negative neurological ROS     GI/Hepatic Neg liver ROS, neg GERD  , Gastroplasty, duodenal switch   Endo/Other  diabetes, Well Controlled, Type 2  Renal/GU negative Renal ROS     Musculoskeletal  (+) Arthritis ,   Abdominal   Peds  Hematology negative hematology ROS (+)   Anesthesia Other Findings Day of surgery medications reviewed with the patient.  Reproductive/Obstetrics                            Anesthesia Physical Anesthesia Plan  ASA: III  Anesthesia Plan: General   Post-op Pain Management:    Induction: Intravenous  PONV Risk Score and Plan: 2 and Ondansetron, Dexamethasone and Midazolam  Airway Management Planned: LMA  Additional Equipment:   Intra-op Plan:   Post-operative Plan: Extubation in OR  Informed Consent: I have reviewed the patients History and Physical, chart, labs and discussed the procedure including the risks, benefits and alternatives for the proposed anesthesia with the patient or authorized representative who has indicated his/her understanding and acceptance.   Dental advisory given  Plan Discussed with: CRNA  Anesthesia Plan Comments:         Anesthesia Quick Evaluation

## 2018-01-29 NOTE — Discharge Instructions (Signed)
Head of bed elevated as able. No heavy lifting. May get area wet starting tomorrow.   Post Anesthesia Home Care Instructions  Activity: Get plenty of rest for the remainder of the day. A responsible individual must stay with you for 24 hours following the procedure.  For the next 24 hours, DO NOT: -Drive a car -Advertising copywriterperate machinery -Drink alcoholic beverages -Take any medication unless instructed by your physician -Make any legal decisions or sign important papers.  Meals: Start with liquid foods such as gelatin or soup. Progress to regular foods as tolerated. Avoid greasy, spicy, heavy foods. If nausea and/or vomiting occur, drink only clear liquids until the nausea and/or vomiting subsides. Call your physician if vomiting continues.  Special Instructions/Symptoms: Your throat may feel dry or sore from the anesthesia or the breathing tube placed in your throat during surgery. If this causes discomfort, gargle with warm salt water. The discomfort should disappear within 24 hours.  If you had a scopolamine patch placed behind your ear for the management of post- operative nausea and/or vomiting:  1. The medication in the patch is effective for 72 hours, after which it should be removed.  Wrap patch in a tissue and discard in the trash. Wash hands thoroughly with soap and water. 2. You may remove the patch earlier than 72 hours if you experience unpleasant side effects which may include dry mouth, dizziness or visual disturbances. 3. Avoid touching the patch. Wash your hands with soap and water after contact with the patch.

## 2018-01-29 NOTE — Transfer of Care (Signed)
Immediate Anesthesia Transfer of Care Note  Patient: Daniel ClearRobert M [name]  Procedure(s) Performed: EXCISION of lesion on scalp (N/A Head)  Patient Location: PACU  Anesthesia Type:General  Level of Consciousness: drowsy and patient cooperative  Airway & Oxygen Therapy: Patient Spontanous Breathing and Patient connected to face mask oxygen  Post-op Assessment: Report given to RN and Post -op Vital signs reviewed and stable  Post vital signs: Reviewed and stable  Last Vitals:  Vitals Value Taken Time  BP    Temp    Pulse 70 01/29/2018 12:27 PM  Resp 11 01/29/2018 12:27 PM  SpO2 100 % 01/29/2018 12:27 PM  Vitals shown include unvalidated device data.  Last Pain:  Vitals:   01/29/18 1040  TempSrc: Oral  PainSc: 0-No pain      Patients Stated Pain Goal: 0 (01/29/18 1040)  Complications: No apparent anesthesia complications

## 2018-01-29 NOTE — Anesthesia Procedure Notes (Signed)
Procedure Name: LMA Insertion Date/Time: 01/29/2018 11:50 AM Performed by: Ronnette HilaPayne, Jessaca Philippi D, CRNA Pre-anesthesia Checklist: Patient identified, Emergency Drugs available, Suction available and Patient being monitored Patient Re-evaluated:Patient Re-evaluated prior to induction Oxygen Delivery Method: Circle system utilized Preoxygenation: Pre-oxygenation with 100% oxygen Induction Type: IV induction Ventilation: Mask ventilation without difficulty LMA: LMA inserted LMA Size: 4.0 Number of attempts: 1 Airway Equipment and Method: Bite block Placement Confirmation: positive ETCO2 Tube secured with: Tape Dental Injury: Teeth and Oropharynx as per pre-operative assessment

## 2018-01-30 ENCOUNTER — Encounter (HOSPITAL_BASED_OUTPATIENT_CLINIC_OR_DEPARTMENT_OTHER): Payer: Self-pay | Admitting: Plastic Surgery

## 2018-02-04 ENCOUNTER — Ambulatory Visit (INDEPENDENT_AMBULATORY_CARE_PROVIDER_SITE_OTHER): Payer: BLUE CROSS/BLUE SHIELD | Admitting: Plastic Surgery

## 2018-02-04 ENCOUNTER — Encounter: Payer: Self-pay | Admitting: Plastic Surgery

## 2018-02-04 VITALS — BP 128/72 | HR 76 | Ht 73.0 in | Wt 180.0 lb

## 2018-02-04 DIAGNOSIS — L989 Disorder of the skin and subcutaneous tissue, unspecified: Secondary | ICD-10-CM

## 2018-02-04 NOTE — Progress Notes (Signed)
Subjective:     Patient ID: Daniel [name], male   DOB: Oct 13, 1955, 62 y.o.   MRN: [id number]  HPI Very pleasant 62 year old male pt presents to clinic 1 week s/p excision of scalp lesion.  Pt reports doing well.  He denies pain.  His hair covers area well so it is unnoticed.  Pt requested to have sutures removed today but Dr. Ulice Bold advised the pt against removing them this early.  She told him the sutures would dissolve with time.  We did inform the pt that his lesion was negative for pathology. Review of Systems  Constitutional: Negative.   HENT: Negative.   Eyes: Negative.   Respiratory: Negative.   Cardiovascular: Negative.   Gastrointestinal: Negative.   Skin: Positive for wound.  Psychiatric/Behavioral: Negative.        Objective:   Physical Exam  Constitutional: He is oriented to person, place, and time. He appears well-developed and well-nourished.  HENT:  Head: Normocephalic and atraumatic.  Pulmonary/Chest: Effort normal.  Abdominal: Soft.  Musculoskeletal: Normal range of motion.  Neurological: He is alert and oriented to person, place, and time.  Skin: Skin is warm and dry.  Psychiatric: He has a normal mood and affect. His behavior is normal. Judgment and thought content normal.  we did visualize the incision on his scalp.  Hair covers it well Sutures still in place.  No edema, no erythema noted, no discharge or sign of infection     Assessment:     S/p excision of scalp    Plan:     Sutures will dissolve over time Prn follow up

## 2018-02-14 ENCOUNTER — Other Ambulatory Visit: Payer: Self-pay | Admitting: Family Medicine

## 2018-02-17 ENCOUNTER — Other Ambulatory Visit: Payer: Self-pay | Admitting: Family Medicine

## 2018-02-17 NOTE — Telephone Encounter (Signed)
Called the patient

## 2018-02-17 NOTE — Telephone Encounter (Signed)
Called left detailed message to schedule followup appt with PCP asap to continue getting refills.

## 2018-02-17 NOTE — Telephone Encounter (Signed)
Last office visit on 08/14/2017

## 2018-02-17 NOTE — Telephone Encounter (Signed)
Please schedule 6 mo appt. TY.

## 2018-02-21 ENCOUNTER — Ambulatory Visit (INDEPENDENT_AMBULATORY_CARE_PROVIDER_SITE_OTHER): Payer: BLUE CROSS/BLUE SHIELD | Admitting: Plastic Surgery

## 2018-02-21 ENCOUNTER — Encounter: Payer: Self-pay | Admitting: Plastic Surgery

## 2018-02-21 VITALS — BP 122/80 | HR 62 | Resp 16 | Ht 73.0 in | Wt 180.0 lb

## 2018-02-21 DIAGNOSIS — L989 Disorder of the skin and subcutaneous tissue, unspecified: Secondary | ICD-10-CM

## 2018-02-21 NOTE — Progress Notes (Signed)
Subjective:    Patient ID: Daniel [name], male    DOB: 06/06/1955, 62 y.o.   MRN: [id number]  Daniel [name] is a 62 year old white male here for follow-up on a scalp excision.  The path showed a benign lesion.  He has been healing well.  He states that it has been itching so he has been scratching the area.  He felt something up there and is concerned we did not get all of.  On exam the skin is irritated and there is a left and a right scab.  This may be irritation from the scratching as well as a part of the healing process.  There is no sign of infection.  There does not appear to be a new lesion.  Due to the itching he has been putting clobetasol on the area.  This is concerning as it may have slowed his healing down.   Review of Systems  Constitutional: Negative.   HENT: Negative.   Eyes: Negative.   Respiratory: Negative.   Endocrine: Negative.   Genitourinary: Negative.   Skin: Positive for wound.       Objective:   Physical Exam  Constitutional: He appears well-developed and well-nourished.  HENT:  Head: Normocephalic and atraumatic.  Pulmonary/Chest: Effort normal.  Neurological: He is alert.  Psychiatric: He has a normal mood and affect. His behavior is normal. Judgment and thought content normal.       Assessment & Plan:  Changing skin lesion  I have recommended absolutely no touching of the area.  If it is itching he needs to take a Benadryl office at night and Claritin if it is during the day.  If it is particularly dry he can use Vaseline to the area.  No further use of clobetasol at this time.  Follow-up as needed.

## 2018-02-28 ENCOUNTER — Encounter: Payer: Self-pay | Admitting: Family Medicine

## 2018-02-28 ENCOUNTER — Ambulatory Visit (INDEPENDENT_AMBULATORY_CARE_PROVIDER_SITE_OTHER): Payer: BLUE CROSS/BLUE SHIELD | Admitting: Family Medicine

## 2018-02-28 VITALS — BP 132/82 | HR 63 | Temp 98.3°F | Ht 73.0 in | Wt 183.4 lb

## 2018-02-28 DIAGNOSIS — Z79899 Other long term (current) drug therapy: Secondary | ICD-10-CM | POA: Diagnosis not present

## 2018-02-28 DIAGNOSIS — E119 Type 2 diabetes mellitus without complications: Secondary | ICD-10-CM | POA: Diagnosis not present

## 2018-02-28 DIAGNOSIS — G4700 Insomnia, unspecified: Secondary | ICD-10-CM

## 2018-02-28 DIAGNOSIS — F341 Dysthymic disorder: Secondary | ICD-10-CM

## 2018-02-28 MED ORDER — NEBIVOLOL HCL 10 MG PO TABS: 10.0000 mg | ORAL_TABLET | Freq: Every day | ORAL | 5 refills | Status: DC

## 2018-02-28 NOTE — Addendum Note (Signed)
Addended by: Mervin KungFERGERSON, Wynton Hufstetler A on: 02/28/2018 04:20 PM   Modules accepted: Orders

## 2018-02-28 NOTE — Patient Instructions (Addendum)
Call your eye doctor for an appointment.   Keep the diet clean and stay active.  Sleep is important to us all. Getting good sleep is imperative to adequate functioning during the day. Work with our counselors who are trained to help people obtain quality sleep. Call [phone] to schedule an appointment or if you are curious about insurance coverage/cost.  Sleep Hygiene Tips:  Do not watch TV or look at screens within 1 hour of going to bed. If you do, make sure there is a blue light filter (nighttime mode) involved.  Try to go to bed around the same time every night. Wake up at the same time within 1 hour of regular time. Ex: If you wake up at 7 AM for work, do not sleep past 8 AM on days that you don't work.  Do not drink alcohol before bedtime.  Do not consume caffeine-containing beverages after noon or within 9 hours of intended bedtime.  Get regular exercise/physical activity in your life, but not within 2 hours of planned bedtime.  Do not take naps.   Do not eat within 2 hours of planned bedtime.  Melatonin, 3-5 mg 30-60 minutes before planned bedtime may be helpful.   The bed should be for sleep or sex only. If after 20-30 minutes you are unable to fall asleep, get up and do something relaxing. Do this until you feel ready to go to sleep again.   Let us know if you need anything.

## 2018-02-28 NOTE — Progress Notes (Signed)
Chief Complaint  Patient presents with  . Follow-up    Subjective: Patient is a 62 y.o. male here for med ck.   Takes Ambien for insomnia.  If he does not take it, he does not sleep.  He takes it on a nightly basis.  He is not having any side effects from it.  He has never gone through cognitive behavioral therapy.  Takes Valium 1-2 times per week for anxiety.  He does not describe classic panic attacks, but high levels of stress/anxiety.  He is also on Cymbalta daily.  He has a history of diabetes.  His A1c has been in the normal range over the past 6 years after he lost 126 pounds.  He is not on any medication currently.  He is due for an eye exam.  He has received his pneumonia shot.  He received a flu shot earlier this year.  ROS: Psych:+anxiety Eyes: No vision changes  Past Medical History:  Diagnosis Date  . Anal fistula    recurrent  . Benign localized prostatic hyperplasia with lower urinary tract symptoms (LUTS)   . Bilateral lower extremity edema    feet  . COPD with emphysema (HCC)   . Diverticulosis of colon   . ED (erectile dysfunction)   . Generalized anxiety disorder   . Hemorrhoids   . History of colon polyps   . History of diabetes mellitus, type II    10-10-2017  per pt no issues after wt loss surgery   . History of pneumothorax    1983 & 1988  right spontaneous, treated w/ chest tube  . Hyperlipidemia   . Hypertension   . MDD (major depressive disorder)   . OSA (obstructive sleep apnea)    10-10-2017  not used cpap since 2016 after wt loss surgery 2015  . Status post biliopancreatic diversion with duodenal switch    07-30-2013   @ [facility] for wt. loss surgery  . Vitamin D deficiency   . Wears glasses   . Zinc deficiency 04/27/2016    Objective: BP 132/82 (BP Location: Left Arm, Patient Position: Sitting, Cuff Size: Normal)   Pulse 63   Temp 98.3 F (36.8 C) (Oral)   Ht 6\' 1"  (1.854 m)   Wt 183 lb 6 oz (83.2 kg)   SpO2 96%   BMI 24.19 kg/m   General: Awake, appears stated age HEENT: MMM, EOMi Skin: No skin lesions on feet Neuro: Sensation intact to pinprick bilaterally Heart: RRR, DP pulses intact Lungs: CTAB, no rales, wheezes or rhonchi. No accessory muscle use Psych: Age appropriate judgment and insight, normal affect and mood  Assessment and Plan: Encounter for long-term (current) use of high-risk medication - Plan: Pain Mgmt, Profile 8 w/Conf, U  Diabetes mellitus type 2 in nonobese (HCC) - Plan: Microalbumin / creatinine urine ratio, Hemoglobin A1c, Comprehensive metabolic panel, Lipid panel, HM DIABETES FOOT EXAM  ANXIETY DEPRESSION  Insomnia, unspecified type  He needs to make an appointment to see his eye doctor.  He does have 1 and does not need a referral at this time.  Counseled on diet and exercise, he is doing a good job.  Check A1c, microalbumin creatinine ratio, foot exam today. Check UDS and renew controlled substance contract. The patient voiced understanding and agreement to the plan.  Jilda Rocheicholas Paul [place] Court House[place], DO 02/28/18  3:54 PM

## 2018-02-28 NOTE — Progress Notes (Signed)
Pre visit review using our clinic review tool, if applicable. No additional management support is needed unless otherwise documented below in the visit note. 

## 2018-03-02 LAB — LIPID PANEL
CHOL/HDL RATIO: 3.3 (calc) (ref <5.0)
Cholesterol: 146 mg/dL (ref <200)
HDL: 44 mg/dL (ref >40)
LDL Cholesterol (Calc): 88 mg/dL (calc)
NON-HDL CHOLESTEROL (CALC): 102 mg/dL (ref <130)
TRIGLYCERIDES: 56 mg/dL (ref <150)

## 2018-03-02 LAB — PAIN MGMT, PROFILE 8 W/CONF, U
6 ACETYLMORPHINE: NEGATIVE ng/mL (ref <10)
ALPHAHYDROXYMIDAZOLAM: NEGATIVE ng/mL (ref <50)
ALPHAHYDROXYTRIAZOLAM: NEGATIVE ng/mL (ref <50)
Alcohol Metabolites: NEGATIVE ng/mL (ref <500)
Alphahydroxyalprazolam: NEGATIVE ng/mL (ref <25)
Aminoclonazepam: NEGATIVE ng/mL (ref <25)
Amphetamines: NEGATIVE ng/mL (ref <500)
BENZODIAZEPINES: POSITIVE ng/mL — AB (ref <100)
Buprenorphine, Urine: NEGATIVE ng/mL (ref <5)
Cocaine Metabolite: NEGATIVE ng/mL (ref <150)
Creatinine: 234.6 mg/dL
HYDROXYETHYLFLURAZEPAM: NEGATIVE ng/mL (ref <50)
Lorazepam: NEGATIVE ng/mL (ref <50)
MARIJUANA METABOLITE: NEGATIVE ng/mL (ref <20)
MDMA: NEGATIVE ng/mL (ref <500)
NORDIAZEPAM: 471 ng/mL — AB (ref <50)
OPIATES: NEGATIVE ng/mL (ref <100)
Oxazepam: 1168 ng/mL — ABNORMAL HIGH (ref <50)
Oxidant: NEGATIVE ug/mL (ref <200)
Oxycodone: NEGATIVE ng/mL (ref <100)
Temazepam: 623 ng/mL — ABNORMAL HIGH (ref <50)
pH: 6.46 (ref 4.5–9.0)

## 2018-03-02 LAB — COMPREHENSIVE METABOLIC PANEL
AG RATIO: 1.5 (calc) (ref 1.0–2.5)
ALT: 25 U/L (ref 9–46)
AST: 24 U/L (ref 10–35)
Albumin: 3.6 g/dL (ref 3.6–5.1)
Alkaline phosphatase (APISO): 134 U/L — ABNORMAL HIGH (ref 40–115)
BUN: 10 mg/dL (ref 7–25)
CO2: 29 mmol/L (ref 20–32)
CREATININE: 1.04 mg/dL (ref 0.70–1.25)
Calcium: 8.7 mg/dL (ref 8.6–10.3)
Chloride: 108 mmol/L (ref 98–110)
GLOBULIN: 2.4 g/dL (ref 1.9–3.7)
GLUCOSE: 86 mg/dL (ref 65–99)
Potassium: 4.4 mmol/L (ref 3.5–5.3)
Sodium: 142 mmol/L (ref 135–146)
Total Bilirubin: 0.6 mg/dL (ref 0.2–1.2)
Total Protein: 6 g/dL — ABNORMAL LOW (ref 6.1–8.1)

## 2018-03-02 LAB — HEMOGLOBIN A1C
EAG (MMOL/L): 5.8 (calc)
Hgb A1c MFr Bld: 5.3 % of total Hgb (ref <5.7)
Mean Plasma Glucose: 105 (calc)

## 2018-03-02 LAB — MICROALBUMIN / CREATININE URINE RATIO
Creatinine, Urine: 259 mg/dL (ref 20–320)
MICROALB/CREAT RATIO: 3 ug/mg{creat} (ref <30)
Microalb, Ur: 0.8 mg/dL

## 2018-03-03 ENCOUNTER — Other Ambulatory Visit: Payer: Self-pay | Admitting: Family Medicine

## 2018-03-03 DIAGNOSIS — R748 Abnormal levels of other serum enzymes: Secondary | ICD-10-CM

## 2018-03-04 ENCOUNTER — Telehealth: Payer: Self-pay | Admitting: Family Medicine

## 2018-03-04 NOTE — Telephone Encounter (Signed)
Bystolic is what I printed, is that what was incorrect? That is once daily.

## 2018-03-04 NOTE — Telephone Encounter (Signed)
I called the patient and he stated that he previously was on Bystolic 5 mg bid and was discussed to increase the bystolic 5 mg tid?? Would like a hardcopy//new prescription and will be in the office at 9 in the morning to do labs and can pickup.        pied from CRM [phone]. Topic: General - Other  >> Mar 04, 2018  2:45 PM Percival SpanishKennedy, Cheryl W wrote:  Pt said the wrong RX was called in to the pharmacy , he said the RX was for 1 tablet a day and he said it should be for 3 tablets a day . He will return the hand written RX and the med that the pharmacy gave him and await the correct RX be called in to the pharmacy    Pharmacy   [facility] [address]

## 2018-03-05 ENCOUNTER — Other Ambulatory Visit (INDEPENDENT_AMBULATORY_CARE_PROVIDER_SITE_OTHER): Payer: BLUE CROSS/BLUE SHIELD

## 2018-03-05 DIAGNOSIS — R748 Abnormal levels of other serum enzymes: Secondary | ICD-10-CM | POA: Diagnosis not present

## 2018-03-05 LAB — GAMMA GT: GGT: 18 U/L (ref 7–51)

## 2018-03-05 MED ORDER — NEBIVOLOL HCL 10 MG PO TABS: 10.0000 mg | ORAL_TABLET | Freq: Two times a day (BID) | ORAL | 5 refills | Status: DC

## 2018-03-05 NOTE — Telephone Encounter (Signed)
PCP reviewed and ok to change to bystolic 10 mg twice daily//Patient aware and ok Given hardcopy

## 2018-03-07 LAB — ALKALINE PHOSPHATASE ISOENZYMES
ALKALINE PHOSPHATASE (APISO): 126 U/L — AB (ref 40–115)
Bone Isoenzymes: 68 % — ABNORMAL HIGH (ref 28–66)
Intestinal Isoenzymes: 5 % (ref 1–24)
LIVER ISOENZYMES (ALP ISO): 27 % (ref 25–69)

## 2018-03-08 ENCOUNTER — Other Ambulatory Visit: Payer: Self-pay | Admitting: Family Medicine

## 2018-03-08 DIAGNOSIS — R748 Abnormal levels of other serum enzymes: Secondary | ICD-10-CM

## 2018-03-17 ENCOUNTER — Other Ambulatory Visit: Payer: Self-pay | Admitting: Family Medicine

## 2018-03-17 NOTE — Progress Notes (Signed)
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 TY.

## 2018-03-17 NOTE — Progress Notes (Signed)
Change CPT order/code for whole body scan

## 2018-03-20 ENCOUNTER — Other Ambulatory Visit: Payer: Self-pay | Admitting: Family Medicine

## 2018-04-09 ENCOUNTER — Other Ambulatory Visit: Payer: Self-pay | Admitting: Family

## 2018-04-09 ENCOUNTER — Other Ambulatory Visit: Payer: Self-pay | Admitting: Family Medicine

## 2018-04-28 ENCOUNTER — Other Ambulatory Visit: Payer: Self-pay | Admitting: Family Medicine

## 2018-06-19 ENCOUNTER — Other Ambulatory Visit: Payer: Self-pay | Admitting: Family Medicine

## 2018-07-20 ENCOUNTER — Other Ambulatory Visit: Payer: Self-pay | Admitting: Family Medicine

## 2018-07-21 NOTE — Telephone Encounter (Signed)
Last OV 02/2018 Last refill 12/2017  #30 with 5 refills.

## 2018-09-26 DIAGNOSIS — F411 Generalized anxiety disorder: Secondary | ICD-10-CM | POA: Diagnosis not present

## 2018-09-26 DIAGNOSIS — F330 Major depressive disorder, recurrent, mild: Secondary | ICD-10-CM | POA: Diagnosis not present

## 2018-10-03 ENCOUNTER — Other Ambulatory Visit: Payer: Self-pay | Admitting: Family Medicine

## 2018-10-03 DIAGNOSIS — L649 Androgenic alopecia, unspecified: Secondary | ICD-10-CM

## 2018-10-04 ENCOUNTER — Other Ambulatory Visit: Payer: Self-pay | Admitting: Family Medicine

## 2018-10-13 ENCOUNTER — Other Ambulatory Visit: Payer: Self-pay | Admitting: Family Medicine

## 2018-10-13 DIAGNOSIS — E291 Testicular hypofunction: Secondary | ICD-10-CM | POA: Diagnosis not present

## 2018-10-13 DIAGNOSIS — R6882 Decreased libido: Secondary | ICD-10-CM | POA: Diagnosis not present

## 2018-10-13 DIAGNOSIS — R5383 Other fatigue: Secondary | ICD-10-CM | POA: Diagnosis not present

## 2018-10-17 ENCOUNTER — Other Ambulatory Visit: Payer: Self-pay

## 2018-10-17 ENCOUNTER — Encounter: Payer: Self-pay | Admitting: Family Medicine

## 2018-10-17 ENCOUNTER — Ambulatory Visit (HOSPITAL_BASED_OUTPATIENT_CLINIC_OR_DEPARTMENT_OTHER)
Admission: RE | Admit: 2018-10-17 | Discharge: 2018-10-17 | Disposition: A | Discharge status: Home / Self Care | Payer: BC Managed Care – PPO | Source: Ambulatory Visit | Attending: Family Medicine | Admitting: Family Medicine

## 2018-10-17 ENCOUNTER — Ambulatory Visit (INDEPENDENT_AMBULATORY_CARE_PROVIDER_SITE_OTHER): Payer: BC Managed Care – PPO | Admitting: Family Medicine

## 2018-10-17 VITALS — BP 120/72 | HR 63 | Temp 98.7°F | Ht 73.0 in | Wt 181.1 lb

## 2018-10-17 DIAGNOSIS — M79605 Pain in left leg: Secondary | ICD-10-CM | POA: Diagnosis not present

## 2018-10-17 DIAGNOSIS — F419 Anxiety disorder, unspecified: Secondary | ICD-10-CM

## 2018-10-17 DIAGNOSIS — G4700 Insomnia, unspecified: Secondary | ICD-10-CM

## 2018-10-17 DIAGNOSIS — M79662 Pain in left lower leg: Secondary | ICD-10-CM | POA: Diagnosis not present

## 2018-10-17 NOTE — Patient Instructions (Signed)
We will be in touch regarding your X-ray.  Ice/cold pack over area for 10-15 min twice daily.  OK to take Tylenol 1000 mg (2 extra strength tabs) or 975 mg (3 regular strength tabs) every 6 hours as needed.  Let us know if you need anything.

## 2018-10-17 NOTE — Progress Notes (Signed)
Musculoskeletal Exam  Patient: Daniel [name] DOB: 1955-04-25  DOS: 10/17/2018  SUBJECTIVE:  Chief Complaint:   Chief Complaint  Patient presents with  . Leg Injury    left    Daniel [name] is a 63 y.o.  male for evaluation and treatment of L leg pain.   Onset:  3 days ago. Stepped off curb when he was expected level ground.  Location: prox fib Character:  aching  Progression of issue:  is unchanged Associated symptoms: hurts when he goes up stairs.  Treatment: to date has been none.   Neurovascular symptoms: no  +GAD, taking Cymbalta daily. Uses valium for panic attacks which manifest as chest pain/tightness. Gets better with Valium. Takes around 8-9x/mo on average. No AE's. No SI or HI.   +Insomnia. Attributes to above. Has very restless sleep if he does not take Ambien. No AE's.   ROS: Musculoskeletal/Extremities: +leg pain Psych: No depression  Past Medical History:  Diagnosis Date  . Anal fistula    recurrent  . Benign localized prostatic hyperplasia with lower urinary tract symptoms (LUTS)   . Bilateral lower extremity edema    feet  . COPD with emphysema ([place])   . Diverticulosis of colon   . ED (erectile dysfunction)   . Generalized anxiety disorder   . Hemorrhoids   . History of colon polyps   . History of diabetes mellitus, type II    10-10-2017  per pt no issues after wt loss surgery   . History of pneumothorax    1983 & 1988  right spontaneous, treated w/ chest tube  . Hyperlipidemia   . Hypertension   . MDD (major depressive disorder)   . OSA (obstructive sleep apnea)    10-10-2017  not used cpap since 2016 after wt loss surgery 2015  . Status post biliopancreatic diversion with duodenal switch    07-30-2013   @ [facility] for wt. loss surgery  . Vitamin D deficiency   . Wears glasses   . Zinc deficiency 04/27/2016    Objective: VITAL SIGNS: BP 120/72 (BP Location: Left Arm, Patient Position: Sitting, Cuff Size: Normal)   Pulse 63   Temp 98.7 F  (37.1 C) (Oral)   Ht 6\' 1"  (1.854 m)   Wt 181 lb 2 oz (82.2 kg)   SpO2 95%   BMI 23.90 kg/m  Constitutional: Well formed, well developed. No acute distress. Cardiovascular: RRR, no bruits, no LE edema Thorax & Lungs: No accessory muscle use, CTAB Musculoskeletal: LLE.   Normal active range of motion: yes.   Normal passive range of motion: yes Tenderness to palpation: Mild ttp over prox fib Deformity: no Ecchymosis: no Neurologic: Normal sensory function. No focal deficits noted.  Psychiatric: Normal mood. Age appropriate judgment and insight. Alert & oriented x 3.    Assessment:  Left leg pain - Plan: DG Tibia/Fibula Left, likely strain of peroneus group. Ice, Tylenol, stretch. We will be in touch with XR results.   Insomnia, unspecified type - Plan: Cont Ambien. When he retires, I will try to wean him off, which I left him know. He is OK with this plan at this time.  Anxiety - Plan: Cont Cymbalta and prn Valium.   Plan: Orders as above. F/u in 6 mo for CPE. The patient voiced understanding and agreement to the plan.   [place], DO 10/17/18  10:37 AM

## 2018-10-20 ENCOUNTER — Telehealth: Payer: Self-pay | Admitting: *Deleted

## 2018-10-20 NOTE — Telephone Encounter (Signed)
That's fine, let's recheck his Vit D. Have him come in to ck when convenient. TY.

## 2018-10-20 NOTE — Telephone Encounter (Signed)
[facility] 50,000  Last vit D done on 08/15/17 and it was 9.21.  Do you want to continue?

## 2018-10-21 NOTE — Telephone Encounter (Signed)
Called the patient informed of instructions. Currently he is out of town on vacation and will call back if wanting to do this once he returns home

## 2018-11-03 ENCOUNTER — Other Ambulatory Visit: Payer: Self-pay | Admitting: Family Medicine

## 2018-11-05 DIAGNOSIS — L281 Prurigo nodularis: Secondary | ICD-10-CM | POA: Diagnosis not present

## 2018-11-05 DIAGNOSIS — L0232 Furuncle of buttock: Secondary | ICD-10-CM | POA: Diagnosis not present

## 2018-11-05 DIAGNOSIS — B9689 Other specified bacterial agents as the cause of diseases classified elsewhere: Secondary | ICD-10-CM | POA: Diagnosis not present

## 2018-11-13 ENCOUNTER — Other Ambulatory Visit: Payer: Self-pay | Admitting: Family Medicine

## 2018-11-18 ENCOUNTER — Encounter: Payer: Self-pay | Admitting: Family Medicine

## 2018-11-18 DIAGNOSIS — H5203 Hypermetropia, bilateral: Secondary | ICD-10-CM | POA: Diagnosis not present

## 2018-11-18 DIAGNOSIS — E119 Type 2 diabetes mellitus without complications: Secondary | ICD-10-CM | POA: Diagnosis not present

## 2018-11-18 LAB — HM DIABETES EYE EXAM

## 2018-11-21 DIAGNOSIS — F411 Generalized anxiety disorder: Secondary | ICD-10-CM | POA: Diagnosis not present

## 2018-11-21 DIAGNOSIS — F330 Major depressive disorder, recurrent, mild: Secondary | ICD-10-CM | POA: Diagnosis not present

## 2018-12-04 ENCOUNTER — Ambulatory Visit (INDEPENDENT_AMBULATORY_CARE_PROVIDER_SITE_OTHER): Payer: BC Managed Care – PPO | Admitting: Family Medicine

## 2018-12-04 ENCOUNTER — Other Ambulatory Visit: Payer: Self-pay

## 2018-12-04 ENCOUNTER — Ambulatory Visit: Payer: BC Managed Care – PPO | Admitting: Family Medicine

## 2018-12-04 ENCOUNTER — Encounter: Payer: Self-pay | Admitting: Family Medicine

## 2018-12-04 VITALS — BP 138/80 | HR 75 | Temp 97.6°F | Ht 73.0 in | Wt 184.2 lb

## 2018-12-04 DIAGNOSIS — R000 Tachycardia, unspecified: Secondary | ICD-10-CM | POA: Diagnosis not present

## 2018-12-04 DIAGNOSIS — F419 Anxiety disorder, unspecified: Secondary | ICD-10-CM | POA: Diagnosis not present

## 2018-12-04 DIAGNOSIS — N529 Male erectile dysfunction, unspecified: Secondary | ICD-10-CM

## 2018-12-04 DIAGNOSIS — G4700 Insomnia, unspecified: Secondary | ICD-10-CM

## 2018-12-04 MED ORDER — METOPROLOL SUCCINATE ER 50 MG PO TB24: 50.0000 mg | ORAL_TABLET | Freq: Every day | ORAL | 3 refills | Status: DC

## 2018-12-04 MED ORDER — SILDENAFIL CITRATE 100 MG PO TABS: ORAL_TABLET | ORAL | 5 refills | Status: DC

## 2018-12-04 MED ORDER — SILDENAFIL CITRATE 100 MG PO TABS: 50.0000 mg | ORAL_TABLET | Freq: Every day | ORAL | 11 refills | Status: DC | PRN

## 2018-12-04 MED ORDER — DULOXETINE HCL 30 MG PO CPEP: ORAL_CAPSULE | ORAL | 2 refills | Status: DC

## 2018-12-04 NOTE — Progress Notes (Signed)
Chief Complaint  Patient presents with  . Medication Problem    discuss BP medication    Subjective: Patient is a 63 y.o. male here for med ck.  Patient has a history of anxiety with depression.  He is currently on Wellbutrin 150 mg daily, Cymbalta 30 mg 3 times daily, and Valium 5-10 mg as needed.  He uses the Valium around once a week on average.  No adverse effects, reports compliance.  No homicidal or suicidal ideation.  He has a history of insomnia.  Takes Ambien most every night.  He is done this for many years.  If he does not take it, he will usually lay there wide-awake.  Denies any racing thoughts.  Has never had cognitive behavioral therapy.  Patient has a history of tachycardia.  He is currently on Bystolic.  While he is not having any issues with the medication, it costs him $100 out of pocket per month.  He would like to change to something cheaper if possible.  He has never tried anything other than Bystolic.  Patient has a history of rectal dysfunction.  He is taking 50-100 mg of sildenafil daily as needed.  It works well.  No adverse effects.   ROS: GU: +ED Psych: As noted in HPI  Past Medical History:  Diagnosis Date  . Anal fistula    recurrent  . Benign localized prostatic hyperplasia with lower urinary tract symptoms (LUTS)   . Bilateral lower extremity edema    feet  . COPD with emphysema ([facility])   . Diverticulosis of colon   . ED (erectile dysfunction)   . Generalized anxiety disorder   . Hemorrhoids   . History of colon polyps   . History of diabetes mellitus, type II    10-10-2017  per pt no issues after wt loss surgery   . History of pneumothorax    1983 & 1988  right spontaneous, treated w/ chest tube  . Hyperlipidemia   . Hypertension   . MDD (major depressive disorder)   . OSA (obstructive sleep apnea)    10-10-2017  not used cpap since 2016 after wt loss surgery 2015  . Status post biliopancreatic diversion with duodenal switch    07-30-2013    @ [facility] for wt. loss surgery  . Vitamin D deficiency   . Wears glasses   . Zinc deficiency 04/27/2016    Objective: BP 138/80 (BP Location: Left Arm, Patient Position: Sitting, Cuff Size: Normal)   Pulse 75   Temp 97.6 F (36.4 C) (Temporal)   Ht 6\' 1"  (1.854 m)   Wt 184 lb 4 oz (83.6 kg)   SpO2 95%   BMI 24.31 kg/m  General: Awake, appears stated age HEENT: MMM, EOMi Heart: RRR, no bruits, no LE edema Lungs: CTAB, no rales, wheezes or rhonchi. No accessory muscle use Psych: Age appropriate judgment and insight, normal affect and mood  Assessment and Plan: Tachycardia - Plan: metoprolol succinate (TOPROL-XL) 50 MG 24 hr tablet  Anxiety - Plan: DULoxetine (CYMBALTA) 30 MG capsule  Insomnia, unspecified type  Erectile dysfunction, unspecified erectile dysfunction type - Plan: sildenafil (VIAGRA) 100 MG tablet, sildenafil (VIAGRA) 100 MG tablet  1- change Bystolic to metoprolol 2-continue Cymbalta and as needed Valium use. 3-continue Ambien 4-reordered Viagra, 2 separate prescriptions.  His insurance will cover 4 tabs per month.  He requested an additional 8 tabs to pay a pocket for. F/u next week for CPE. After that, q 6 mo.  The patient voiced understanding  and agreement to the plan.  [place], DO 12/04/18  11:46 AM

## 2018-12-04 NOTE — Patient Instructions (Signed)
Ok to change the Bystolic to the Metoprolol as it should be much cheaper. Let us know if there are issues.  Let us know if you need anything.

## 2018-12-08 ENCOUNTER — Other Ambulatory Visit: Payer: Self-pay

## 2018-12-08 ENCOUNTER — Encounter: Payer: Self-pay | Admitting: Family Medicine

## 2018-12-08 ENCOUNTER — Ambulatory Visit (INDEPENDENT_AMBULATORY_CARE_PROVIDER_SITE_OTHER): Payer: BC Managed Care – PPO | Admitting: Family Medicine

## 2018-12-08 VITALS — BP 132/82 | HR 68 | Temp 97.7°F | Ht 72.0 in | Wt 183.0 lb

## 2018-12-08 DIAGNOSIS — Z0000 Encounter for general adult medical examination without abnormal findings: Secondary | ICD-10-CM

## 2018-12-08 LAB — LIPID PANEL
Cholesterol: 119 mg/dL (ref 0–200)
HDL: 36 mg/dL — ABNORMAL LOW (ref >39.00)
LDL Cholesterol: 75 mg/dL (ref 0–99)
NonHDL: 82.96
Total CHOL/HDL Ratio: 3
Triglycerides: 42 mg/dL (ref 0.0–149.0)
VLDL: 8.4 mg/dL (ref 0.0–40.0)

## 2018-12-08 LAB — B12 AND FOLATE PANEL
Folate: 2.2 ng/mL — ABNORMAL LOW (ref >5.9)
Vitamin B-12: 232 pg/mL (ref 211–911)

## 2018-12-08 LAB — COMPREHENSIVE METABOLIC PANEL
ALT: 39 U/L (ref 0–53)
AST: 28 U/L (ref 0–37)
Albumin: 3.7 g/dL (ref 3.5–5.2)
Alkaline Phosphatase: 129 U/L — ABNORMAL HIGH (ref 39–117)
BUN: 12 mg/dL (ref 6–23)
CO2: 29 mEq/L (ref 19–32)
Calcium: 8.8 mg/dL (ref 8.4–10.5)
Chloride: 108 mEq/L (ref 96–112)
Creatinine, Ser: 0.93 mg/dL (ref 0.40–1.50)
GFR: 82.16 mL/min (ref >60.00)
Glucose, Bld: 96 mg/dL (ref 70–99)
Potassium: 3.8 mEq/L (ref 3.5–5.1)
Sodium: 142 mEq/L (ref 135–145)
Total Bilirubin: 0.5 mg/dL (ref 0.2–1.2)
Total Protein: 5.7 g/dL — ABNORMAL LOW (ref 6.0–8.3)

## 2018-12-08 LAB — CBC
HCT: 41 % (ref 39.0–52.0)
Hemoglobin: 13.5 g/dL (ref 13.0–17.0)
MCHC: 32.9 g/dL (ref 30.0–36.0)
MCV: 87.6 fl (ref 78.0–100.0)
Platelets: 232 10*3/uL (ref 150.0–400.0)
RBC: 4.68 Mil/uL (ref 4.22–5.81)
RDW: 15.1 % (ref 11.5–15.5)
WBC: 6.2 10*3/uL (ref 4.0–10.5)

## 2018-12-08 LAB — MAGNESIUM: Magnesium: 1.8 mg/dL (ref 1.5–2.5)

## 2018-12-08 LAB — VITAMIN D 25 HYDROXY (VIT D DEFICIENCY, FRACTURES): VITD: 12.27 ng/mL — ABNORMAL LOW (ref 30.00–100.00)

## 2018-12-08 NOTE — Progress Notes (Signed)
Chief Complaint  Patient presents with  . Annual Exam    Well Male Daniel [name] is here for a complete physical.   His last physical was >1 year ago.  Current diet: in general, diet could be better.  Current exercise: rides horses bid Weight trend: stable Daytime fatigue? No. Seat belt? sometimes    Health maintenance Shingrix- No Colonoscopy- Yes Tetanus- Yes HIV- Yes Hep C- Yes  Past Medical History:  Diagnosis Date  . Anal fistula    recurrent  . Benign localized prostatic hyperplasia with lower urinary tract symptoms (LUTS)   . Bilateral lower extremity edema    feet  . COPD with emphysema ([facility])   . Diverticulosis of colon   . ED (erectile dysfunction)   . Generalized anxiety disorder   . Hemorrhoids   . History of colon polyps   . History of diabetes mellitus, type II    10-10-2017  per pt no issues after wt loss surgery   . History of pneumothorax    1983 & 1988  right spontaneous, treated w/ chest tube  . Hyperlipidemia   . Hypertension   . MDD (major depressive disorder)   . OSA (obstructive sleep apnea)    10-10-2017  not used cpap since 2016 after wt loss surgery 2015  . Status post biliopancreatic diversion with duodenal switch    07-30-2013   @ [facility] for wt. loss surgery  . Vitamin D deficiency   . Wears glasses   . Zinc deficiency 04/27/2016      Past Surgical History:  Procedure Laterality Date  . ANAL FISTULOTOMY N/A 08/07/2016   Procedure: ANAL FISTULOTOMY;  Surgeon: Abigail MiyamotoBlackman, Douglas, MD;  [facility];  Service: General;  Laterality: N/A;  . CHEST TUBE INSERTION  1988   right lung for spontaneous pneumothorax  . COLONOSCOPY W/ BIOPSIES  08/02/2016  . EXOSTECTECTOMY TOE Left 04/25/2017   Procedure: TARSAL EXOSTECTOMY OF MEDIAL CUNEIFORM AND EXOSTECTOMY OF THE FIRST METATARSAL;  Surgeon: Felecia ShellingEvans, Brent M, DPM;  Location: [facility] OR;  Service: Podiatry;  Laterality: Left;  Marland Kitchen. GASTROPLASTY DUODENAL SWITCH  07-30-2013    @[facility]    "wt loss surgery not  gastric bypass surgery" (biliopancreatic diversion w/ duodenal switch)  . HEMORRHOID SURGERY N/A 08/07/2016   Procedure: HEMORRHOIDECTOMY;  Surgeon: Abigail MiyamotoBlackman, Douglas, MD;  [facility];  Service: General;  Laterality: N/A;  . INCISION AND DRAINAGE ABSCESS N/A 10/14/2017   Procedure: POSSIBLE INCISION AND DRAINAGE OF ANAL ABSCESS;  Surgeon: Andria MeuseWhite, Christopher M, MD;  Location: [facility];  Service: General;  Laterality: N/A;  . INGUINAL HERNIA REPAIR Left 04/10/2017   Procedure: LAPAROSCOPIC LEFT INGUINAL HERNIA REPAIR WITH MESH;  Surgeon: Abigail MiyamotoBlackman, Douglas, MD;  Location: [facility];  Service: General;  Laterality: Left;  . INSERTION OF MESH Left 04/10/2017   Procedure: INSERTION OF MESH;  Surgeon: Abigail MiyamotoBlackman, Douglas, MD;  Location: [facility];  Service: General;  Laterality: Left;  . IRRIGATION AND DEBRIDEMENT SEBACEOUS CYST     back  . MASS EXCISION N/A 01/29/2018   Procedure: EXCISION of lesion on scalp;  Surgeon: Peggye Formillingham, Claire S, DO;  Location: [facility];  Service: Plastics;  Laterality: N/A;  . RECTAL EXAM UNDER ANESTHESIA N/A 10/14/2017   Procedure: ANAL EXAM UNDER ANESTHESIA, INJECTION OF FIBRIN GLUE;  Surgeon: Andria MeuseWhite, Christopher M, MD;  Location: [facility];  Service: General;  Laterality: N/A;    Medications  Current Outpatient Medications on File Prior to Visit  Medication Sig Dispense Refill  . buPROPion (WELLBUTRIN XL) 150 MG 24 hr tablet TAKE 1 TABLET BY MOUTH THREE TIMES A DAY 270 tablet 1  . Calcium Carb-Cholecalciferol (CALCIUM PLUS VITAMIN D3) 600-500 MG-UNIT CAPS Take 1 tablet by mouth daily.    . Cholecalciferol (VITAMIN D3) 1000 units CAPS Take 1,000 Units by mouth daily.    . clobetasol (TEMOVATE) 0.05 % external solution APPLY 1 APPLICATION TOPICALLY DAILY AS NEEDED (ITCHING). USE TO SCALP AS DIRECTED 50 mL 2  . diazepam (VALIUM) 5 MG tablet TAKE 2 TABLETS EVERY 8 HOURS AS NEEDED FOR ANXIETY  90 tablet 1  . DULoxetine (CYMBALTA) 30 MG capsule TAKE 3 CAPSULES BY MOUTH EVERY DAY 270 capsule 2  . finasteride (PROPECIA) 1 MG tablet TAKE 1 TABLET BY MOUTH EVERY DAY 90 tablet 3  . lisinopril (ZESTRIL) 2.5 MG tablet TAKE 1 TABLET BY MOUTH EVERY DAY 90 tablet 1  . metoprolol succinate (TOPROL-XL) 50 MG 24 hr tablet Take 1 tablet (50 mg total) by mouth daily. Take with or immediately following a meal. 30 tablet 3  . Multiple Vitamins-Minerals (CENTRUM SILVER) tablet Take 2 tablets by mouth daily.    . Omega-3 300 MG CAPS Take 300 mg by mouth daily.    . sildenafil (VIAGRA) 100 MG tablet Take 1 tab daily as needed for ED. 4 tablet 5  . sildenafil (VIAGRA) 100 MG tablet Take 0.5-1 tablets (50-100 mg total) by mouth daily as needed for erectile dysfunction. 8 tablet 11  . testosterone cypionate (DEPOTESTOTERONE CYPIONATE) 100 MG/ML injection Inject 1 mL (100 mg total) into the muscle every 7 (seven) days. For IM use only 10 mL   . VITAMIN A PO Take 2,400 Units by mouth daily.    . vitamin E 200 UNIT capsule Take 200 Units by mouth daily.    Marland Kitchen zinc gluconate 50 MG tablet Take 100 mg by mouth daily.    Marland Kitchen zolpidem (AMBIEN) 10 MG tablet TAKE 1 TABLET BY MOUTH EVERYDAY AT BEDTIME 30 tablet 5   Allergies No Known Allergies  Family History Family History  Problem Relation Age of Onset  . Cancer Mother        ovarian  . Hyperlipidemia Father   . Hypertension Father   . Heart disease Father        MI 7/19  . Emphysema Father   . Pulmonary disease Father   . Heart disease Maternal Grandmother   . Heart disease Maternal Grandfather   . Stroke Maternal Grandfather   . Heart disease Paternal Grandmother   . Angina Paternal Grandmother   . Heart disease Paternal Grandfather   . Stroke Paternal Grandfather   . Diabetes Maternal Uncle   . Kidney disease Maternal Uncle   . Diabetes Paternal Uncle   . Cancer Paternal Uncle     Review of Systems: Constitutional:  no fevers Eye:  no recent  significant change in vision Ear/Nose/Mouth/Throat:  Ears:  no hearing loss Nose/Mouth/Throat:  no complaints of nasal congestion, no sore throat Cardiovascular:  no chest pain, no palpitations Respiratory:  no cough and no shortness of breath Gastrointestinal:  no abdominal pain, no change in bowel habits GU:  Male: negative for dysuria, frequency, and incontinence and negative for prostate symptoms Musculoskeletal/Extremities:  no pain, redness, or swelling of the joints Integumentary (Skin/Breast):  no abnormal skin lesions reported Neurologic:  no headaches Endocrine: No unexpected weight changes Hematologic/Lymphatic:  no abnormal bleeding  Exam BP 132/82 (BP Location: Left Arm, Patient Position:  Sitting, Cuff Size: Normal)   Pulse 68   Temp 97.7 F (36.5 C) (Temporal)   Ht 6' (1.829 m)   Wt 183 lb (83 kg)   SpO2 98%   BMI 24.82 kg/m  General:  well developed, well nourished, in no apparent distress Skin:  no significant moles, warts, or growths Head:  no masses, lesions, or tenderness Eyes:  pupils equal and round, sclera anicteric without injection Ears:  canals without lesions, TMs shiny without retraction, no obvious effusion, no erythema Nose:  nares patent, septum midline, mucosa normal Throat/Pharynx:  lips and gingiva without lesion; tongue and uvula midline; non-inflamed pharynx; no exudates or postnasal drainage Neck: neck supple without adenopathy, thyromegaly, or masses Lungs:  clear to auscultation, breath sounds equal bilaterally, no respiratory distress Cardio:  regular rate and rhythm, no LE edema, no bruits Abdomen:  abdomen soft, nontender; bowel sounds normal; no masses or organomegaly Rectal: Deferred Musculoskeletal:  symmetrical muscle groups noted without atrophy or deformity Extremities:  no clubbing, cyanosis, or edema, no deformities, no skin discoloration Neuro:  gait normal; deep tendon reflexes normal and symmetric Psych: well oriented with  normal range of affect and appropriate judgment/insight  Assessment and Plan  Well adult exam - Plan: CBC, Comprehensive metabolic panel, Lipid panel, Zinc, Magnesium, Vitamin B1, B12 and Folate Panel, Vitamin D (25 hydroxy)   Well 63 y.o. male. Counseled on diet and exercise. Counseled on risks and benefits of prostate cancer screening with PSA. The patient agrees to forego testing, will discuss w his urologist. Immunizations, labs, and further orders as above. Follow up in 6 mo. The patient voiced understanding and agreement to the plan.  Jilda Roche Coward, DO 12/08/18 10:18 AM

## 2018-12-08 NOTE — Patient Instructions (Addendum)
Give [place] 2-3 business days to get the results of your labs back.   Keep the diet clean and stay active.  The new Shingrix vaccine (for shingles) is a 2 shot series. It can make people feel low energy, achy and almost like they have the flu for 48 hours after injection. Please plan accordingly when deciding on when to get this shot. Call our office for a nurse visit appointment to get this. The second shot of the series is less severe regarding the side effects, but it still lasts 48 hours.    Call [phone] regarding your chest CT to screen for lung cancer.   Let us know if you need anything.

## 2018-12-10 LAB — ZINC: Zinc: 53 ug/dL — ABNORMAL LOW (ref 60–130)

## 2018-12-11 ENCOUNTER — Other Ambulatory Visit: Payer: Self-pay | Admitting: Family Medicine

## 2018-12-11 DIAGNOSIS — E559 Vitamin D deficiency, unspecified: Secondary | ICD-10-CM

## 2018-12-11 DIAGNOSIS — E60 Dietary zinc deficiency: Secondary | ICD-10-CM

## 2018-12-11 DIAGNOSIS — E538 Deficiency of other specified B group vitamins: Secondary | ICD-10-CM

## 2018-12-11 LAB — VITAMIN B1: Vitamin B1 (Thiamine): 12 nmol/L (ref 8–30)

## 2018-12-11 MED ORDER — VITAMIN D (ERGOCALCIFEROL) 1.25 MG (50000 UNIT) PO CAPS: 50000.0000 [IU] | ORAL_CAPSULE | ORAL | 0 refills | Status: DC

## 2018-12-15 DIAGNOSIS — E291 Testicular hypofunction: Secondary | ICD-10-CM | POA: Diagnosis not present

## 2018-12-19 ENCOUNTER — Telehealth: Payer: Self-pay | Admitting: Family Medicine

## 2018-12-19 NOTE — Telephone Encounter (Signed)
Called the patient to inform of PCP instructions. Folate 400 mcg= 1 mg daily. The patient stated the pharmacist told him you can no longer get just plain folate, usually comes in a combo. Also the pharmacist did ask him if he was to get a synthetic or natural and also needed the brand name. The patient stated that his understanding is it is no longer available/or hard to get plain, they need more specifics, than just folate.

## 2018-12-19 NOTE — Telephone Encounter (Signed)
Pt states that when he got lab results he was told to get Folate.  Pt sates that he went to the pharmacy and states that the pharmacy states that this is an obscure drug and it comes in lots of doses and in combination with other medications.  Pt isn't sure what he is supposed to be getting.  Pt also [facility].  He would like further clarification. Pt can be reached at [phone]

## 2018-12-19 NOTE — Telephone Encounter (Signed)
Called informed of PCP instructions and also that our [facility] does not have in stock, but they can order what PCP requested and have in their pharmacy in a couple days. Also suggested to ask his pharmacist if he could special order this for him. The patient verbalized understanding/had no other questions or concerns.

## 2018-12-19 NOTE — Telephone Encounter (Signed)
Folic acid is an over the counter supplement. He can get it at most pharmacies, [facility] know [facility] it. [facility] is another option if he prefers to [employer].

## 2018-12-19 NOTE — Telephone Encounter (Signed)
I'm about 99% sure he can get it by itself at [facility] or [facility] for several dollars. If not, certain multivitamins will have 0.4 mg (may also be written as 400 mcg) which would be appropriate.

## 2018-12-26 DIAGNOSIS — E291 Testicular hypofunction: Secondary | ICD-10-CM | POA: Diagnosis not present

## 2019-01-14 ENCOUNTER — Other Ambulatory Visit: Payer: Self-pay

## 2019-01-14 ENCOUNTER — Ambulatory Visit (INDEPENDENT_AMBULATORY_CARE_PROVIDER_SITE_OTHER)
Admission: RE | Admit: 2019-01-14 | Discharge: 2019-01-14 | Disposition: A | Discharge status: Home / Self Care | Payer: BC Managed Care – PPO | Source: Ambulatory Visit | Attending: Acute Care | Admitting: Acute Care

## 2019-01-14 DIAGNOSIS — F17210 Nicotine dependence, cigarettes, uncomplicated: Secondary | ICD-10-CM

## 2019-01-14 DIAGNOSIS — Z87891 Personal history of nicotine dependence: Secondary | ICD-10-CM

## 2019-01-14 DIAGNOSIS — Z122 Encounter for screening for malignant neoplasm of respiratory organs: Secondary | ICD-10-CM

## 2019-01-19 ENCOUNTER — Other Ambulatory Visit: Payer: Self-pay | Admitting: *Deleted

## 2019-01-19 DIAGNOSIS — F17210 Nicotine dependence, cigarettes, uncomplicated: Secondary | ICD-10-CM

## 2019-01-19 DIAGNOSIS — Z122 Encounter for screening for malignant neoplasm of respiratory organs: Secondary | ICD-10-CM

## 2019-01-27 ENCOUNTER — Ambulatory Visit (INDEPENDENT_AMBULATORY_CARE_PROVIDER_SITE_OTHER): Payer: BC Managed Care – PPO | Admitting: Plastic Surgery

## 2019-01-27 ENCOUNTER — Encounter: Payer: Self-pay | Admitting: Plastic Surgery

## 2019-01-27 ENCOUNTER — Other Ambulatory Visit: Payer: Self-pay

## 2019-01-27 VITALS — BP 137/81 | HR 66 | Temp 97.9°F | Ht 73.0 in | Wt 181.0 lb

## 2019-01-27 DIAGNOSIS — L989 Disorder of the skin and subcutaneous tissue, unspecified: Secondary | ICD-10-CM

## 2019-01-27 NOTE — Progress Notes (Signed)
Subjective:    Patient ID: Daniel [name], male    DOB: [birth year], 63 y.o.   MRN: [id number]  The patient is a 63 year old male here for evaluation of a scalp lesion.  He has had several lesions in the past that have come back as picker's nodules.  The patient states he has tried creams and lotions on it without any help.  It is very itchy and he has been scratching it.  We talked about the need to not scratch it.  Its about 6 mm in size.  It is dark and has a scab on it.  It looks like it is in the same area as one of his previous excisions.  He is otherwise doing well and healthy.  It does not appear to be infected.  There are no other concerning lesions.  He is still getting his hair dyed.  This could be creating some of the irritation.     Review of Systems  Constitutional: Negative.  Negative for activity change and appetite change.  HENT: Negative.   Eyes: Negative.   Respiratory: Negative.   Cardiovascular: Negative.  Negative for leg swelling.  Gastrointestinal: Negative.   Musculoskeletal: Negative.   Skin: Positive for color change and wound.  Hematological: Negative.   Psychiatric/Behavioral: Negative.        Objective:   Physical Exam Vitals signs and nursing note reviewed.  Constitutional:      Appearance: Normal appearance.  HENT:     Head: Normocephalic.  Cardiovascular:     Rate and Rhythm: Normal rate.     Pulses: Normal pulses.  Pulmonary:     Effort: Pulmonary effort is normal. No respiratory distress.  Neurological:     General: No focal deficit present.     Mental Status: He is alert and oriented to person, place, and time.  Psychiatric:        Mood and Affect: Mood normal.        Behavior: Behavior normal.           Assessment & Plan:

## 2019-02-11 ENCOUNTER — Other Ambulatory Visit: Payer: Self-pay | Admitting: Family Medicine

## 2019-02-11 ENCOUNTER — Telehealth: Payer: Self-pay

## 2019-02-11 DIAGNOSIS — L649 Androgenic alopecia, unspecified: Secondary | ICD-10-CM

## 2019-02-11 DIAGNOSIS — F419 Anxiety disorder, unspecified: Secondary | ICD-10-CM

## 2019-02-11 MED ORDER — ZOLPIDEM TARTRATE 10 MG PO TABS: ORAL_TABLET | ORAL | 5 refills | Status: DC

## 2019-02-11 MED ORDER — DULOXETINE HCL 30 MG PO CPEP: ORAL_CAPSULE | ORAL | 2 refills | Status: DC

## 2019-02-11 MED ORDER — VITAMIN D (ERGOCALCIFEROL) 1.25 MG (50000 UNIT) PO CAPS: 50000.0000 [IU] | ORAL_CAPSULE | ORAL | 1 refills | Status: DC

## 2019-02-11 NOTE — Telephone Encounter (Signed)
Patient came into the office requesting hardcopy refills for zolpidem, Duloxetine and Vitamin D . PCP did ok and sign off on doing refills/hardcopy's for all of the above medication.

## 2019-02-18 DIAGNOSIS — F411 Generalized anxiety disorder: Secondary | ICD-10-CM | POA: Diagnosis not present

## 2019-02-18 DIAGNOSIS — F330 Major depressive disorder, recurrent, mild: Secondary | ICD-10-CM | POA: Diagnosis not present

## 2019-02-20 ENCOUNTER — Other Ambulatory Visit: Payer: Self-pay | Admitting: Family Medicine

## 2019-03-06 ENCOUNTER — Encounter: Payer: Self-pay | Admitting: Plastic Surgery

## 2019-03-06 ENCOUNTER — Other Ambulatory Visit: Payer: Self-pay | Admitting: Family Medicine

## 2019-03-06 ENCOUNTER — Other Ambulatory Visit: Payer: Self-pay

## 2019-03-06 ENCOUNTER — Ambulatory Visit: Payer: BC Managed Care – PPO | Admitting: Plastic Surgery

## 2019-03-06 VITALS — BP 152/91 | HR 64 | Temp 97.5°F | Ht 73.0 in | Wt 183.4 lb

## 2019-03-06 DIAGNOSIS — R000 Tachycardia, unspecified: Secondary | ICD-10-CM

## 2019-03-06 DIAGNOSIS — L989 Disorder of the skin and subcutaneous tissue, unspecified: Secondary | ICD-10-CM

## 2019-03-06 NOTE — Progress Notes (Signed)
Procedure Note  Preoperative Dx: skin lesion of scalp  Postoperative Dx: Same  Procedure: Excision of skin lesion of scalp 5 x 10 mm  Anesthesia: Lidocaine 1% with 1:100,000 epinepherine   Description of Procedure: Risks and complications were explained to the patient.  Consent was confirmed and the patient understands the risks and benefits.  The potential complications and alternatives were explained and the patient consents.  The patient expressed understanding the option of not having the procedure and the risks of a scar.  Time out was called and all information was confirmed to be correct.    The area was prepped and drapped.  Lidocaine 1% with epinepherine was injected in the subcutaneous area.  After waiting several minutes for the local to take affect a #15 blade was used to excise the area in an eliptical pattern with 84mm borders from the biopsy site.  A 5-0 Monocryl was used to close the skin edges with vertical mattress and simple sutures.  A dressing was applied.  The patient was given instructions on how to care for the area and a follow up appointment.  Daniel [name] tolerated the procedure well and there were no complications.

## 2019-03-11 ENCOUNTER — Ambulatory Visit: Payer: Self-pay

## 2019-03-11 NOTE — Telephone Encounter (Signed)
Incoming call from Patient who complains of abdominal pain, onset was suddenly onset .  States it comes and goes.  Rates it moderately.  It is recurrent  Sx.  Has a hx of zinc deficiency. Also reports burning tongue syndrome.States Call to office made an appointment for 0920 am with 03/12/19 with Dr. Larose Kells .  Patient voices understanding.        Reason for Disposition . [1] MILD-MODERATE pain AND [2] constant AND [3] present > 2 hours  Answer Assessment - Initial Assessment Questions 1. LOCATION: "Where does it hurt?"      Middle of belly  2. RADIATION: "Does the pain shoot anywhere else?" (e.g., chest, back)     denies 3. ONSET: "When did the pain begin?" (Minutes, hours or days ago)     An hour ago 4. SUDDEN: "Gradual or sudden onset?"     Suddenly, sharp  5. PATTERN "Does the pain come and go, or is it constant?"    - If constant: "Is it getting better, staying the same, or worsening?"      (Note: Constant means the pain never goes away completely; most serious pain is constant and it progresses)     - If intermittent: "How long does it last?" "Do you have pain now?"     (Note: Intermittent means the pain goes away completely between bouts)     Comes and goes 6. SEVERITY: "How bad is the pain?"  (e.g., Scale 1-10; mild, moderate, or severe)    - MILD (1-3): doesn't interfere with normal activities, abdomen soft and not tender to touch     - MODERATE (4-7): interferes with normal activities or awakens from sleep, tender to touch     - SEVERE (8-10): excruciating pain, doubled over, unable to do any normal activities      moderate 7. RECURRENT SYMPTOM: "Have you ever had this type of abdominal pain before?" If so, ask: "When was the last time?" and "What happened that time?"      yes 8. CAUSE: "What do you think is causing the abdominal pain?"     *No Answer* 9. RELIEVING/AGGRAVATING FACTORS: "What makes it better or worse?" (e.g., movement, antacids, bowel movement)     10. OTHER  SYMPTOMS: "Has there been any vomiting, diarrhea, constipation, or urine problems?"      Denies  Protocols used: ABDOMINAL PAIN - MALE-A-AH

## 2019-03-12 ENCOUNTER — Encounter: Payer: Self-pay | Admitting: Internal Medicine

## 2019-03-12 ENCOUNTER — Ambulatory Visit (INDEPENDENT_AMBULATORY_CARE_PROVIDER_SITE_OTHER): Payer: BC Managed Care – PPO | Admitting: Internal Medicine

## 2019-03-12 ENCOUNTER — Other Ambulatory Visit: Payer: Self-pay

## 2019-03-12 VITALS — BP 139/82 | HR 64 | Temp 97.1°F | Resp 18 | Ht 73.0 in | Wt 182.1 lb

## 2019-03-12 DIAGNOSIS — R1030 Lower abdominal pain, unspecified: Secondary | ICD-10-CM | POA: Diagnosis not present

## 2019-03-12 DIAGNOSIS — N401 Enlarged prostate with lower urinary tract symptoms: Secondary | ICD-10-CM | POA: Diagnosis not present

## 2019-03-12 LAB — URINALYSIS, ROUTINE W REFLEX MICROSCOPIC
Leukocytes,Ua: NEGATIVE
Nitrite: POSITIVE — AB
Specific Gravity, Urine: >=1.03 — AB (ref 1.000–1.030)
Total Protein, Urine: NEGATIVE
Urine Glucose: NEGATIVE
Urobilinogen, UA: 0.2 (ref 0.0–1.0)
pH: 5.5 (ref 5.0–8.0)

## 2019-03-12 LAB — BASIC METABOLIC PANEL
BUN: 12 mg/dL (ref 6–23)
CO2: 30 mEq/L (ref 19–32)
Calcium: 8.9 mg/dL (ref 8.4–10.5)
Chloride: 106 mEq/L (ref 96–112)
Creatinine, Ser: 0.83 mg/dL (ref 0.40–1.50)
GFR: 93.61 mL/min (ref >60.00)
Glucose, Bld: 95 mg/dL (ref 70–99)
Potassium: 4.1 mEq/L (ref 3.5–5.1)
Sodium: 138 mEq/L (ref 135–145)

## 2019-03-12 MED ORDER — TAMSULOSIN HCL 0.4 MG PO CAPS: 0.4000 mg | ORAL_CAPSULE | Freq: Every day | ORAL | 0 refills | Status: DC

## 2019-03-12 NOTE — Patient Instructions (Addendum)
GO TO THE LAB : Get the blood work     GO TO THE FRONT DESK Schedule your next appointment   to see your primary doctor soon for other issues   If your symptoms are not gradually improving let us know.  If you have severe pain, fever, chills, diarrhea or you get worse: Call, or go to the urgent care or ER

## 2019-03-12 NOTE — Progress Notes (Signed)
Pre visit review using our clinic review tool, if applicable. No additional management support is needed unless otherwise documented below in the visit note. 

## 2019-03-12 NOTE — Progress Notes (Signed)
Subjective:    Patient ID: Daniel [name], male    DOB: Sep 30, 1955, 63 y.o.   MRN: [id number]  DOS:  03/12/2019 Type of visit - description: Acute 3 to 4 days of on and off sharp pain under the umbilical area.  No clearly related to bowel movements or eating.  I ask about urinary symptoms he has chronic LUTS: Difficulty urinating some urinary frequency but no dysuria or gross hematuria. Symptoms are stable, chronic, at some point he was prescribed Flomax with some help and request a prescription.  Review of Systems Denies fever chills Appetite is okay No nausea, vomiting, diarrhea. No constipation Occasionally sees blood in the toilet paper he thinks associated with hemorrhoids. At this point with no rectal pain per se.  Past Medical History:  Diagnosis Date  . Anal fistula    recurrent  . Benign localized prostatic hyperplasia with lower urinary tract symptoms (LUTS)   . Bilateral lower extremity edema    feet  . COPD with emphysema (HCC)   . Diverticulosis of colon   . ED (erectile dysfunction)   . Generalized anxiety disorder   . Hemorrhoids   . History of colon polyps   . History of diabetes mellitus, type II    10-10-2017  per pt no issues after wt loss surgery   . History of pneumothorax    1983 & 1988  right spontaneous, treated w/ chest tube  . Hyperlipidemia   . Hypertension   . MDD (major depressive disorder)   . OSA (obstructive sleep apnea)    10-10-2017  not used cpap since 2016 after wt loss surgery 2015  . Status post biliopancreatic diversion with duodenal switch    07-30-2013   @ [facility] for wt. loss surgery  . Vitamin D deficiency   . Wears glasses   . Zinc deficiency 04/27/2016    Past Surgical History:  Procedure Laterality Date  . ANAL FISTULOTOMY N/A 08/07/2016   Procedure: ANAL FISTULOTOMY;  Surgeon: Abigail Miyamoto, MD;  Location: [facility] OR;  Service: General;  Laterality: N/A;  . CHEST TUBE INSERTION  1988   right lung for spontaneous  pneumothorax  . COLONOSCOPY W/ BIOPSIES  08/02/2016  . EXOSTECTECTOMY TOE Left 04/25/2017   Procedure: TARSAL EXOSTECTOMY OF MEDIAL CUNEIFORM AND EXOSTECTOMY OF THE FIRST METATARSAL;  Surgeon: Felecia Shelling, DPM;  Location: [facility] OR;  Service: Podiatry;  Laterality: Left;  Marland Kitchen GASTROPLASTY DUODENAL SWITCH  07-30-2013    @[facility]    "wt loss surgery not gastric bypass surgery" (biliopancreatic diversion w/ duodenal switch)  . HEMORRHOID SURGERY N/A 08/07/2016   Procedure: HEMORRHOIDECTOMY;  Surgeon: Abigail Miyamoto, MD;  Location: [facility] OR;  Service: General;  Laterality: N/A;  . INCISION AND DRAINAGE ABSCESS N/A 10/14/2017   Procedure: POSSIBLE INCISION AND DRAINAGE OF ANAL ABSCESS;  Surgeon: Andria Meuse, MD;  Location: [facility];  Service: General;  Laterality: N/A;  . INGUINAL HERNIA REPAIR Left 04/10/2017   Procedure: LAPAROSCOPIC LEFT INGUINAL HERNIA REPAIR WITH MESH;  Surgeon: Abigail Miyamoto, MD;  Location: [facility];  Service: General;  Laterality: Left;  . INSERTION OF MESH Left 04/10/2017   Procedure: INSERTION OF MESH;  Surgeon: Abigail Miyamoto, MD;  Location: [facility];  Service: General;  Laterality: Left;  . IRRIGATION AND DEBRIDEMENT SEBACEOUS CYST     back  . MASS EXCISION N/A 01/29/2018   Procedure: EXCISION of lesion on scalp;  Surgeon: Peggye Form, DO;  Location: [facility];  Service: Government social research officer;  Laterality: N/A;  . RECTAL EXAM UNDER ANESTHESIA N/A 10/14/2017   Procedure: ANAL EXAM UNDER ANESTHESIA, INJECTION OF FIBRIN GLUE;  Surgeon: Andria Meuse, MD;  Location: [facility];  Service: General;  Laterality: N/A;    Social History   Socioeconomic History  . Marital status: Single    Spouse name: Not on file  . Number of children: 0  . Years of education: Not on file  . Highest education level: Not on file  Occupational History  . Occupation: Airline pilot  Tobacco Use  . Smoking  status: Current Every Day Smoker    Packs/day: 0.50    Years: 50.00    Pack years: 25.00    Types: Cigarettes  . Smokeless tobacco: Never Used  Substance and Sexual Activity  . Alcohol use: Yes    Alcohol/week: 0.0 standard drinks    Comment: rare  . Drug use: No  . Sexual activity: Not on file  Other Topics Concern  . Not on file  Social History Narrative   Regular exercise-no   Social Determinants of Health   Financial Resource Strain:   . Difficulty of Paying Living Expenses: Not on file  Food Insecurity:   . Worried About Programme researcher, broadcasting/film/video in the Last Year: Not on file  . Ran Out of Food in the Last Year: Not on file  Transportation Needs:   . Lack of Transportation (Medical): Not on file  . Lack of Transportation (Non-Medical): Not on file  Physical Activity:   . Days of Exercise per Week: Not on file  . Minutes of Exercise per Session: Not on file  Stress:   . Feeling of Stress : Not on file  Social Connections:   . Frequency of Communication with Friends and Family: Not on file  . Frequency of Social Gatherings with Friends and Family: Not on file  . Attends Religious Services: Not on file  . Active Member of Clubs or Organizations: Not on file  . Attends Banker Meetings: Not on file  . Marital Status: Not on file  Intimate Partner Violence:   . Fear of Current or Ex-Partner: Not on file  . Emotionally Abused: Not on file  . Physically Abused: Not on file  . Sexually Abused: Not on file      Allergies as of 03/12/2019   No Known Allergies     Medication List       Accurate as of March 12, 2019  9:34 AM. If you have any questions, ask your nurse or doctor.        buPROPion 150 MG 24 hr tablet Commonly known as: WELLBUTRIN XL TAKE 1 TABLET BY MOUTH THREE TIMES A DAY   Calcium Plus Vitamin D3 600-500 MG-UNIT Caps Generic drug: Calcium Carb-Cholecalciferol Take 1 tablet by mouth daily.   Centrum Silver tablet Take 2 tablets by  mouth daily.   clobetasol 0.05 % external solution Commonly known as: TEMOVATE APPLY 1 APPLICATION TOPICALLY DAILY AS NEEDED (ITCHING). USE TO SCALP AS DIRECTED   diazepam 5 MG tablet Commonly known as: VALIUM TAKE 2 TABLETS EVERY 8 HOURS AS NEEDED FOR ANXIETY   DULoxetine 30 MG capsule Commonly known as: CYMBALTA TAKE 3 CAPSULES BY MOUTH EVERY DAY   finasteride 1 MG tablet Commonly known as: PROPECIA TAKE 1 TABLET BY MOUTH EVERY DAY   lisinopril 2.5 MG tablet Commonly known as: ZESTRIL TAKE 1 TABLET BY MOUTH EVERY DAY   metoprolol succinate 50 MG  24 hr tablet Commonly known as: TOPROL-XL TAKE 1 TABLET (50 MG TOTAL) BY MOUTH DAILY. TAKE WITH OR IMMEDIATELY FOLLOWING A MEAL.   Omega-3 300 MG Caps Take 300 mg by mouth daily.   sildenafil 100 MG tablet Commonly known as: Viagra Take 0.5-1 tablets (50-100 mg total) by mouth daily as needed for erectile dysfunction. What changed: Another medication with the same name was removed. Continue taking this medication, and follow the directions you see here. Changed by: Willow Ora, MD   testosterone cypionate 100 MG/ML injection Commonly known as: DEPOTESTOTERONE CYPIONATE Inject 1 mL (100 mg total) into the muscle every 7 (seven) days. For IM use only   VITAMIN A PO Take 2,400 Units by mouth daily.   Vitamin D (Ergocalciferol) 1.25 MG (50000 UT) Caps capsule Commonly known as: DRISDOL Take 1 capsule (50,000 Units total) by mouth every 7 (seven) days.   Vitamin D3 25 MCG (1000 UT) Caps Take 1,000 Units by mouth daily.   vitamin E 200 UNIT capsule Take 200 Units by mouth daily.   zinc gluconate 50 MG tablet Take 100 mg by mouth daily.   zolpidem 10 MG tablet Commonly known as: AMBIEN TAKE 1 TABLET BY MOUTH EVERYDAY AT BEDTIME           Objective:   Physical Exam BP 139/82 (BP Location: Left Arm, Patient Position: Sitting, Cuff Size: Small)   Pulse 64   Temp (!) 97.1 F (36.2 C) (Temporal)   Resp 18   Ht 6\' 1"   (1.854 m)   Wt 182 lb 2 oz (82.6 kg)   SpO2 100%   BMI 24.03 kg/m  General:   Well developed, NAD, BMI noted.  HEENT:  Normocephalic . Face symmetric, atraumatic Lungs:  CTA B Normal respiratory effort, no intercostal retractions, no accessory muscle use. Heart: RRR,  no murmur.  no pretibial edema bilaterally  Abdomen:  Not distended, soft, non-tender to deep palpation.  No umbilical hernia. No rebound or rigidity. DRE: Prostate is soft, nontender, stool are brown. Skin: Not pale. Not jaundice Neurologic:  alert & oriented X3.  Speech normal, gait appropriate for age and unassisted Psych--  Cognition and judgment appear intact.  Cooperative with normal attention span and concentration.  Behavior appropriate. No anxious or depressed appearing.     Assessment    63 year old gentleman, PMH includesCOPD, diverticulosis, anxiety and major depression, diabetes, HTN, high cholesterol, OSA, anal fistulectomy, gastroplasty with duodenal switch.  Presents with  Suprapubic pain: As described above, history of diverticulosis but no diverticulitis.  Chronic LUTS, apparently not worse but requested a prescription for Flomax which helped him before. DDx: Early/atypical diverticulitis, UTI, doubt prostatitis. To be sure we will get a BMP, CBC, PSA, UA urine culture. Prescribe Flomax If symptoms increase during this long weekend he needs to call or go to the urgent care/ER. Has other issues, recommend to see PCP soon   This visit occurred during the SARS-CoV-2 public health emergency.  Safety protocols were in place, including screening questions prior to the visit, additional usage of staff PPE, and extensive cleaning of exam room while observing appropriate contact time as indicated for disinfecting solutions.

## 2019-03-14 ENCOUNTER — Other Ambulatory Visit: Payer: Self-pay | Admitting: Internal Medicine

## 2019-03-14 LAB — URINE CULTURE
MICRO NUMBER:: 1230306
SPECIMEN QUALITY:: ADEQUATE

## 2019-03-14 MED ORDER — SULFAMETHOXAZOLE-TRIMETHOPRIM 800-160 MG PO TABS: 1.0000 | ORAL_TABLET | Freq: Two times a day (BID) | ORAL | 0 refills | Status: DC

## 2019-03-15 ENCOUNTER — Other Ambulatory Visit: Payer: Self-pay | Admitting: Internal Medicine

## 2019-03-15 MED ORDER — CEFUROXIME AXETIL 500 MG PO TABS: 500.0000 mg | ORAL_TABLET | Freq: Two times a day (BID) | ORAL | 0 refills | Status: DC

## 2019-03-16 ENCOUNTER — Other Ambulatory Visit: Payer: Self-pay

## 2019-03-16 ENCOUNTER — Ambulatory Visit (INDEPENDENT_AMBULATORY_CARE_PROVIDER_SITE_OTHER): Payer: BC Managed Care – PPO | Admitting: Family Medicine

## 2019-03-16 ENCOUNTER — Encounter: Payer: Self-pay | Admitting: Family Medicine

## 2019-03-16 VITALS — BP 120/84 | HR 83 | Temp 97.5°F | Ht 73.0 in | Wt 177.0 lb

## 2019-03-16 DIAGNOSIS — K146 Glossodynia: Secondary | ICD-10-CM | POA: Diagnosis not present

## 2019-03-16 DIAGNOSIS — R682 Dry mouth, unspecified: Secondary | ICD-10-CM | POA: Diagnosis not present

## 2019-03-16 DIAGNOSIS — L989 Disorder of the skin and subcutaneous tissue, unspecified: Secondary | ICD-10-CM | POA: Diagnosis not present

## 2019-03-16 DIAGNOSIS — N3000 Acute cystitis without hematuria: Secondary | ICD-10-CM | POA: Diagnosis not present

## 2019-03-16 NOTE — Progress Notes (Signed)
Chief Complaint  Patient presents with  . Follow-up    labs    Daniel [name] is a 63 y.o. male here for a skin complaint.  Duration: 1 week  Location: inner L thigh Pruritic? No Painful? No Drainage? No New soaps/lotions/topicals/detergents? No Other associated symptoms: no fevers Therapies tried thus far: none  Dx'd w a UTI last week. Currently taking Ceftin, had taken 2nd dose this AM. Sharp abd pains have improved. No AE's w meds.  Dry mouth over past several days. Does not use mouthwash. Nml eating/drinking. No sores in mouth or pain. No change in toothpaste.   ROS:  Const: No fevers Skin: As noted in HPI  Past Medical History:  Diagnosis Date  . Anal fistula    recurrent  . Benign localized prostatic hyperplasia with lower urinary tract symptoms (LUTS)   . Bilateral lower extremity edema    feet  . COPD with emphysema ([place])   . Diverticulosis of colon   . ED (erectile dysfunction)   . Generalized anxiety disorder   . Hemorrhoids   . History of colon polyps   . History of diabetes mellitus, type II    10-10-2017  per pt no issues after wt loss surgery   . History of pneumothorax    1983 & 1988  right spontaneous, treated w/ chest tube  . Hyperlipidemia   . Hypertension   . MDD (major depressive disorder)   . OSA (obstructive sleep apnea)    10-10-2017  not used cpap since 2016 after wt loss surgery 2015  . Status post biliopancreatic diversion with duodenal switch    07-30-2013   @ [facility] for wt. loss surgery  . Vitamin D deficiency   . Wears glasses   . Zinc deficiency 04/27/2016    BP 120/84 (BP Location: Left Arm, Patient Position: Sitting, Cuff Size: Normal)   Pulse 83   Temp (!) 97.5 F (36.4 C) (Temporal)   Ht 6\' 1"  (1.854 m)   Wt 177 lb (80.3 kg)   SpO2 97%   BMI 23.35 kg/m  Gen: awake, alert, appearing stated age Heart: RRR Abd: BS+, S, NT, ND Lungs: CTAB. No accessory muscle use Skin: L scrotum there is a hardened/semi-solid lesion that is  freely moveable. No drainage, erythema, TTP, fluctuance, excoriation Mouth: membranes dry, no lesions or erythema.  Psych: Age appropriate judgment and insight  Dry mouth  Tongue burning sensation  Skin lesion  Acute cystitis without hematuria  1- Stay hydrated. Biotene mouthwash. Discuss w dentist if no better.  2- Ck Zinc at lab visit. Cont supp. 3- Reassurance. Can [place] or discuss w GS if bothersome. Likely cyst of skin. 4- Cont abx, likely improving. F/u as originally scheduled.  The patient voiced understanding and agreement to the plan.  [place], DO 03/16/19 2:18 PM

## 2019-03-16 NOTE — Patient Instructions (Addendum)
Biotene mouthwash can be helpful for dry mouth.  Stay hydrated.  I would run this by your dentist as well for recommendations.   Continue the zinc supplement.   I would keep an eye on the skin lesion.   Finish the antibiotic for the UTI.  Let us know if you need anything.

## 2019-03-19 ENCOUNTER — Other Ambulatory Visit: Payer: BC Managed Care – PPO

## 2019-03-22 ENCOUNTER — Other Ambulatory Visit: Payer: Self-pay | Admitting: Family Medicine

## 2019-03-27 ENCOUNTER — Encounter: Payer: Self-pay | Admitting: Plastic Surgery

## 2019-03-27 ENCOUNTER — Ambulatory Visit (INDEPENDENT_AMBULATORY_CARE_PROVIDER_SITE_OTHER): Payer: BC Managed Care – PPO | Admitting: Plastic Surgery

## 2019-03-27 ENCOUNTER — Other Ambulatory Visit: Payer: Self-pay

## 2019-03-27 VITALS — BP 126/77 | HR 63 | Temp 98.2°F | Ht 73.0 in | Wt 178.0 lb

## 2019-03-27 DIAGNOSIS — L989 Disorder of the skin and subcutaneous tissue, unspecified: Secondary | ICD-10-CM

## 2019-03-27 NOTE — Progress Notes (Signed)
Subjective:     Patient ID: Daniel [name], male    DOB: October 08, 1955, 64 y.o.   MRN: [id number]  Chief Complaint  Patient presents with  . Follow-up    2 weeks from excision of scalp lesion    HPI: The patient is a 64 y.o. male here for follow-up after excision of scalp lesion on 03/06/2019 with Dr. Ulice Bold.  Today Daniel [name] is doing well.  He reports no concerns.  Denies HA, drainage from incision, and pain. Suture knots present.   Review of Systems  Constitutional: Negative for chills and fever.  HENT: Negative for congestion and sore throat.   Respiratory: Negative for cough and shortness of breath.   Cardiovascular: Negative for chest pain.  Gastrointestinal: Negative for nausea and vomiting.  Skin:       No drainage from incision, no pain.     Objective:   Vital Signs BP 126/77 (BP Location: Right Arm, Patient Position: Sitting, Cuff Size: Normal)   Pulse 63   Temp 98.2 F (36.8 C) (Temporal)   Ht 6\' 1"  (1.854 m)   Wt 178 lb (80.7 kg)   SpO2 98%   BMI 23.48 kg/m  Vital Signs and Nursing Note Reviewed  Physical Exam  Constitutional: He is oriented to person, place, and time and well-developed, well-nourished, and in no distress.  HENT:  Head: Normocephalic and atraumatic.    Incision healing well, c/d/i. No signs of infection, drainage, redness, seroma/hematoma. Suture knots present.  Eyes: EOM are normal.  Pulmonary/Chest: Effort normal.  Musculoskeletal:        General: Normal range of motion.     Cervical back: Normal range of motion.  Neurological: He is alert and oriented to person, place, and time. Gait normal.  Skin: Skin is warm and dry. No rash noted. No erythema. No pallor.  Psychiatric: Mood, memory, affect and judgment normal.    Assessment/Plan:     ICD-10-CM   1. Changing skin lesion  L98.9     Daniel [name] is doing well. Incision is healing well; c/d/i. No drainage, redness, swelling, bruising, seroma/hematoma, or sings of infection.  Suture knots were removed.   Wash hair gently for at least another week. May apply Vaseline to keep area moisturized.  Call office with any questions or concerns.  The 21st Century Cures Act was signed into law in 2016 which includes the topic of electronic health records.  This provides immediate access to information in MyChart.  This includes consultation notes, operative notes, office notes, lab results and pathology reports.  If you have any questions about what you read please let us know at your next visit or call us at the office.  We are right here with you.     Eldridge Abrahams, PA-C 03/27/2019, 12:38 PM

## 2019-03-31 ENCOUNTER — Encounter: Payer: Self-pay | Admitting: Medical

## 2019-03-31 ENCOUNTER — Other Ambulatory Visit: Payer: Self-pay

## 2019-03-31 ENCOUNTER — Ambulatory Visit (INDEPENDENT_AMBULATORY_CARE_PROVIDER_SITE_OTHER): Payer: BC Managed Care – PPO | Admitting: Medical

## 2019-03-31 VITALS — Ht 73.0 in

## 2019-03-31 DIAGNOSIS — J40 Bronchitis, not specified as acute or chronic: Secondary | ICD-10-CM | POA: Diagnosis not present

## 2019-03-31 DIAGNOSIS — R059 Cough, unspecified: Secondary | ICD-10-CM

## 2019-03-31 DIAGNOSIS — J3489 Other specified disorders of nose and nasal sinuses: Secondary | ICD-10-CM | POA: Diagnosis not present

## 2019-03-31 DIAGNOSIS — R05 Cough: Secondary | ICD-10-CM | POA: Diagnosis not present

## 2019-03-31 MED ORDER — AZITHROMYCIN 250 MG PO TABS: ORAL_TABLET | ORAL | 0 refills | Status: DC

## 2019-03-31 MED ORDER — FLUTICASONE PROPIONATE 50 MCG/ACT NA SUSP: 2.0000 | Freq: Every day | NASAL | 1 refills | Status: DC

## 2019-03-31 MED ORDER — HYDROCODONE-HOMATROPINE 5-1.5 MG/5ML PO SYRP: 5.0000 mL | ORAL_SOLUTION | Freq: Four times a day (QID) | ORAL | 0 refills | Status: DC | PRN

## 2019-03-31 NOTE — Progress Notes (Signed)
Subjective:    Patient ID: Daniel [name], male    DOB: November 21, 1955, 63 y.o.   MRN: [id number]  HPI  Virtual Visit via Telephone Note  I connected with Daniel [name] on 03/31/19 at  9:00 AM EST by telephone and verified that I am speaking with the correct person using two identifiers.  Location: Patient: home Provider: office   I discussed the limitations, risks, security and privacy concerns of performing an evaluation and management service by telephone and the availability of in person appointments. I also discussed with the patient that there may be a patient responsible charge related to this service. The patient expressed understanding and agreed to proceed.     History of Present Illness:   Pt states on Saturday felt mild sick. Then on Sunday symptoms gradually got worse. By Sunday started to feel terrible.  Pt states normal smell, no diarrhea, no known contact to covid and no bodyaches.   He states he has ha, nasal congestion and runny nose. He has a lot of pnd which causes him to cough. Cough makes his throat hurt some. He has been taking antihistamine.    Pt when he coughs is bringing up some phlem.  He states he feels cruddy/fatigue fatigued.   Pt has been on doxycycline for leg wound/infection. Dentist gave that to pt. His leg is better.  When blows nose he gets some [name] mucus.    Observations/Objective:  General- no acute distress, pleasant, alert, and oriented. Normal speech.    Assessment and Plan: For sinus pressure, productive cough and nasal congestion, I prescribed azithromycin, flonase, and hycodan.  I want you to stay home for next 5-7 days until we confirm improving. Also having staff to contact you with number to covid testing center. Can get scheduled for next later in the week Thursday or Friday.  Follow up 7 days or as needed  Esperanza Richters, PA-C     Follow Up Instructions:    I discussed the assessment and treatment plan with  the patient. The patient was provided an opportunity to ask questions and all were answered. The patient agreed with the plan and demonstrated an understanding of the instructions.   The patient was advised to call back or seek an in-person evaluation if the symptoms worsen or if the condition fails to improve as anticipated.  I provided 30  minutes of non-face-to-face time during this encounter.   Esperanza Richters, PA-C     Review of Systems  Constitutional: Positive for fatigue. Negative for chills and fever.  HENT: Positive for congestion and sinus pressure. Negative for sinus pain.   Respiratory: Positive for cough. Negative for chest tightness, shortness of breath and wheezing.   Cardiovascular: Negative for chest pain and palpitations.  Gastrointestinal: Negative for abdominal pain.  Musculoskeletal: Negative for back pain and myalgias.  Skin: Negative for rash.  Neurological: Negative for dizziness and headaches.  Hematological: Negative for adenopathy. Does not bruise/bleed easily.  Psychiatric/Behavioral: Negative for behavioral problems and confusion.   Past Medical History:  Diagnosis Date  . Anal fistula    recurrent  . Benign localized prostatic hyperplasia with lower urinary tract symptoms (LUTS)   . Bilateral lower extremity edema    feet  . COPD with emphysema (HCC)   . Diverticulosis of colon   . ED (erectile dysfunction)   . Generalized anxiety disorder   . Hemorrhoids   . History of colon polyps   . History of diabetes mellitus, type II  10-10-2017  per pt no issues after wt loss surgery   . History of pneumothorax    1983 & 1988  right spontaneous, treated w/ chest tube  . Hyperlipidemia   . Hypertension   . MDD (major depressive disorder)   . OSA (obstructive sleep apnea)    10-10-2017  not used cpap since 2016 after wt loss surgery 2015  . Status post biliopancreatic diversion with duodenal switch    07-30-2013   @ [facility] for wt. loss surgery  .  Vitamin D deficiency   . Wears glasses   . Zinc deficiency 04/27/2016     Social History   Socioeconomic History  . Marital status: Single    Spouse name: Not on file  . Number of children: 0  . Years of education: Not on file  . Highest education level: Not on file  Occupational History  . Occupation: Airline pilot  Tobacco Use  . Smoking status: Current Every Day Smoker    Packs/day: 0.50    Years: 50.00    Pack years: 25.00    Types: Cigarettes  . Smokeless tobacco: Never Used  Substance and Sexual Activity  . Alcohol use: Yes    Alcohol/week: 0.0 standard drinks    Comment: rare  . Drug use: No  . Sexual activity: Not on file  Other Topics Concern  . Not on file  Social History Narrative   Regular exercise-no   Social Determinants of Health   Financial Resource Strain:   . Difficulty of Paying Living Expenses: Not on file  Food Insecurity:   . Worried About Programme researcher, broadcasting/film/video in the Last Year: Not on file  . Ran Out of Food in the Last Year: Not on file  Transportation Needs:   . Lack of Transportation (Medical): Not on file  . Lack of Transportation (Non-Medical): Not on file  Physical Activity:   . Days of Exercise per Week: Not on file  . Minutes of Exercise per Session: Not on file  Stress:   . Feeling of Stress : Not on file  Social Connections:   . Frequency of Communication with Friends and Family: Not on file  . Frequency of Social Gatherings with Friends and Family: Not on file  . Attends Religious Services: Not on file  . Active Member of Clubs or Organizations: Not on file  . Attends Banker Meetings: Not on file  . Marital Status: Not on file  Intimate Partner Violence:   . Fear of Current or Ex-Partner: Not on file  . Emotionally Abused: Not on file  . Physically Abused: Not on file  . Sexually Abused: Not on file    Past Surgical History:  Procedure Laterality Date  . ANAL FISTULOTOMY N/A 08/07/2016   Procedure: ANAL  FISTULOTOMY;  Surgeon: Abigail Miyamoto, MD;  Location: [facility] OR;  Service: General;  Laterality: N/A;  . CHEST TUBE INSERTION  1988   right lung for spontaneous pneumothorax  . COLONOSCOPY W/ BIOPSIES  08/02/2016  . EXOSTECTECTOMY TOE Left 04/25/2017   Procedure: TARSAL EXOSTECTOMY OF MEDIAL CUNEIFORM AND EXOSTECTOMY OF THE FIRST METATARSAL;  Surgeon: Felecia Shelling, DPM;  Location: [facility] OR;  Service: Podiatry;  Laterality: Left;  Marland Kitchen GASTROPLASTY DUODENAL SWITCH  07-30-2013    @[facility]    "wt loss surgery not gastric bypass surgery" (biliopancreatic diversion w/ duodenal switch)  . HEMORRHOID SURGERY N/A 08/07/2016   Procedure: HEMORRHOIDECTOMY;  Surgeon: Abigail Miyamoto, MD;  Location: [facility] OR;  Service: General;  Laterality:  N/A;  . INCISION AND DRAINAGE ABSCESS N/A 10/14/2017   Procedure: POSSIBLE INCISION AND DRAINAGE OF ANAL ABSCESS;  Surgeon: Andria Meuse, MD;  Location: [facility] [place];  Service: General;  Laterality: N/A;  . INGUINAL HERNIA REPAIR Left 04/10/2017   Procedure: LAPAROSCOPIC LEFT INGUINAL HERNIA REPAIR WITH MESH;  Surgeon: Abigail Miyamoto, MD;  Location: [facility];  Service: General;  Laterality: Left;  . INSERTION OF MESH Left 04/10/2017   Procedure: INSERTION OF MESH;  Surgeon: Abigail Miyamoto, MD;  Location: [facility];  Service: General;  Laterality: Left;  . IRRIGATION AND DEBRIDEMENT SEBACEOUS CYST     back  . MASS EXCISION N/A 01/29/2018   Procedure: EXCISION of lesion on scalp;  Surgeon: Peggye Form, DO;  Location:  SURGERY CENTER;  Service: Plastics;  Laterality: N/A;  . RECTAL EXAM UNDER ANESTHESIA N/A 10/14/2017   Procedure: ANAL EXAM UNDER ANESTHESIA, INJECTION OF FIBRIN GLUE;  Surgeon: Andria Meuse, MD;  Location: [facility];  Service: General;  Laterality: N/A;    Family History  Problem Relation Age of Onset  . Cancer Mother        ovarian  . Hyperlipidemia Father   .  Hypertension Father   . Heart disease Father        MI 7/19  . Emphysema Father   . Pulmonary disease Father   . Heart disease Maternal Grandmother   . Heart disease Maternal Grandfather   . Stroke Maternal Grandfather   . Heart disease Paternal Grandmother   . Angina Paternal Grandmother   . Heart disease Paternal Grandfather   . Stroke Paternal Grandfather   . Diabetes Maternal Uncle   . Kidney disease Maternal Uncle   . Diabetes Paternal Uncle   . Cancer Paternal Uncle     No Known Allergies  Current Outpatient Medications on File Prior to Visit  Medication Sig Dispense Refill  . buPROPion (WELLBUTRIN XL) 150 MG 24 hr tablet TAKE 1 TABLET BY MOUTH THREE TIMES A DAY 270 tablet 1  . Calcium Carb-Cholecalciferol (CALCIUM PLUS VITAMIN D3) 600-500 MG-UNIT CAPS Take 1 tablet by mouth daily.    . cefUROXime (CEFTIN) 500 MG tablet Take 1 tablet (500 mg total) by mouth 2 (two) times daily with a meal. 14 tablet 0  . Cholecalciferol (VITAMIN D3) 1000 units CAPS Take 1,000 Units by mouth daily.    . clobetasol (TEMOVATE) 0.05 % external solution APPLY 1 APPLICATION TOPICALLY DAILY AS NEEDED (ITCHING). USE TO SCALP AS DIRECTED 50 mL 2  . diazepam (VALIUM) 5 MG tablet TAKE 2 TABLETS EVERY 8 HOURS AS NEEDED FOR ANXIETY 90 tablet 1  . DULoxetine (CYMBALTA) 30 MG capsule TAKE 3 CAPSULES BY MOUTH EVERY DAY 270 capsule 2  . finasteride (PROPECIA) 1 MG tablet TAKE 1 TABLET BY MOUTH EVERY DAY 90 tablet 3  . lisinopril (ZESTRIL) 2.5 MG tablet TAKE 1 TABLET BY MOUTH EVERY DAY 90 tablet 1  . metoprolol succinate (TOPROL-XL) 50 MG 24 hr tablet TAKE 1 TABLET (50 MG TOTAL) BY MOUTH DAILY. TAKE WITH OR IMMEDIATELY FOLLOWING A MEAL. 90 tablet 1  . Multiple Vitamins-Minerals (CENTRUM SILVER) tablet Take 2 tablets by mouth daily.    . Omega-3 300 MG CAPS Take 300 mg by mouth daily.    . sildenafil (VIAGRA) 100 MG tablet Take 0.5-1 tablets (50-100 mg total) by mouth daily as needed for erectile dysfunction. 8  tablet 11  . tamsulosin (FLOMAX) 0.4 MG CAPS capsule  Take 1 capsule (0.4 mg total) by mouth daily after supper. 30 capsule 0  . testosterone cypionate (DEPOTESTOTERONE CYPIONATE) 100 MG/ML injection Inject 1 mL (100 mg total) into the muscle every 7 (seven) days. For IM use only 10 mL   . VITAMIN A PO Take 2,400 Units by mouth daily.    . Vitamin D, Ergocalciferol, (DRISDOL) 1.25 MG (50000 UT) CAPS capsule Take 1 capsule (50,000 Units total) by mouth every 7 (seven) days. 12 capsule 1  . vitamin E 200 UNIT capsule Take 200 Units by mouth daily.    Marland Kitchen zinc gluconate 50 MG tablet Take 100 mg by mouth daily.    Marland Kitchen zolpidem (AMBIEN) 10 MG tablet TAKE 1 TABLET BY MOUTH EVERYDAY AT BEDTIME 30 tablet 5   No current facility-administered medications on file prior to visit.    Ht 6\' 1"  (1.854 m)   BMI 23.48 kg/m       Objective:   Physical Exam        Assessment & Plan:

## 2019-03-31 NOTE — Patient Instructions (Signed)
For sinus pressure, productive cough and nasal congestion, I prescribed azithromycin, flonase, and hycodan.  I want you to stay home for next 5-7 days until we confirm improving. Also having staff to contact you with number to covid testing center. Can get scheduled for next later in the week Thursday or Friday.  Follow up 7 days or as needed

## 2019-04-02 ENCOUNTER — Ambulatory Visit: Payer: BC Managed Care – PPO | Attending: Internal Medicine

## 2019-04-02 DIAGNOSIS — Z20822 Contact with and (suspected) exposure to covid-19: Secondary | ICD-10-CM

## 2019-04-03 ENCOUNTER — Encounter: Payer: Self-pay | Admitting: Family Medicine

## 2019-04-03 ENCOUNTER — Encounter: Payer: Self-pay | Admitting: Medical

## 2019-04-03 ENCOUNTER — Ambulatory Visit (INDEPENDENT_AMBULATORY_CARE_PROVIDER_SITE_OTHER): Payer: BC Managed Care – PPO | Admitting: Medical

## 2019-04-03 VITALS — Ht 73.0 in | Wt 178.0 lb

## 2019-04-03 DIAGNOSIS — J40 Bronchitis, not specified as acute or chronic: Secondary | ICD-10-CM | POA: Diagnosis not present

## 2019-04-03 LAB — NOVEL CORONAVIRUS, NAA: SARS-CoV-2, NAA: NOT DETECTED

## 2019-04-03 MED ORDER — HYDROCODONE-HOMATROPINE 5-1.5 MG/5ML PO SYRP: 5.0000 mL | ORAL_SOLUTION | Freq: Four times a day (QID) | ORAL | 0 refills | Status: DC | PRN

## 2019-04-03 NOTE — Progress Notes (Signed)
Subjective:    Patient ID: Daniel [name], male    DOB: 1955/10/17, 64 y.o.   MRN: [id number]  HPI  Virtual Visit via Telephone Note  I connected with [name] on 04/03/19 at  2:40 PM EST by telephone and verified that I am speaking with the correct person using two identifiers.  Location: Patient: home Provider: home  Pt did not take his bp, pulse or temp today   I discussed the limitations, risks, security and privacy concerns of performing an evaluation and management service by telephone and the availability of in person appointments. I also discussed with the patient that there may be a patient responsible charge related to this service. The patient expressed understanding and agreed to proceed.   History of Present Illness: Pt states he did get tested for covid but he remains doubtful per his report.  Pt states his cough is increasing. Some productive cough.   Pt thinks he might have rattle type sound in his chest at time.  No fever but couple of times felt chills.   Pt will finish azithromycin tomorrow.   No sob or wheezing.   See last visit not.  No report of fatigue, body aches, diarrha, nausea, vomiting or change in smell/taste.          Observations/Objective: General- no acute distress, pleasant, alert and oriented. Normal speech. Only sounds more nasal congested today.  Assessment and Plan: Continue zpack until finished/tomorrow. Then restart your doxy. This would be beneficial since you report rattle sound in chest. Awaiting covid test result. If test negative and symptoms persist then consider out patient chest xray.  Refill your hycodan today.  Will touch base on how you are doing clinically when covid test results in.  Follow Up Instructions:    I discussed the assessment and treatment plan with the patient. The patient was provided an opportunity to ask questions and all were answered. The patient agreed with the plan and demonstrated  an understanding of the instructions.   The patient was advised to call back or seek an in-person evaluation if the symptoms worsen or if the condition fails to improve as anticipated.  I provided 20  minutes of non-face-to-face time during this encounter.   Esperanza Richters, PA-C   Review of Systems     Objective:   Physical Exam        Assessment & Plan:

## 2019-04-03 NOTE — Patient Instructions (Signed)
Continue zpack until finished/tomorrow. Then restart your doxy. This would be beneficial since you report rattle sound in chest. Awaiting covid test result. If test negative and symptoms persist then consider out patient chest xray.  Refill your hycodan today.  Will touch base on how you are doing clinically when covid test results in.

## 2019-04-06 ENCOUNTER — Other Ambulatory Visit: Payer: Self-pay | Admitting: Internal Medicine

## 2019-04-17 ENCOUNTER — Other Ambulatory Visit: Payer: Self-pay | Admitting: Family Medicine

## 2019-04-22 ENCOUNTER — Other Ambulatory Visit: Payer: Self-pay | Admitting: Medical

## 2019-04-23 ENCOUNTER — Other Ambulatory Visit: Payer: Self-pay | Admitting: Family Medicine

## 2019-04-23 NOTE — Telephone Encounter (Signed)
Per OV, d/c 12/04/18

## 2019-04-28 DIAGNOSIS — L111 Transient acantholytic dermatosis [Grover]: Secondary | ICD-10-CM | POA: Diagnosis not present

## 2019-04-28 DIAGNOSIS — R208 Other disturbances of skin sensation: Secondary | ICD-10-CM | POA: Diagnosis not present

## 2019-05-12 ENCOUNTER — Other Ambulatory Visit: Payer: Self-pay | Admitting: Family Medicine

## 2019-05-22 ENCOUNTER — Other Ambulatory Visit: Payer: Self-pay | Admitting: Family Medicine

## 2019-06-07 ENCOUNTER — Other Ambulatory Visit: Payer: Self-pay | Admitting: Family Medicine

## 2019-06-07 DIAGNOSIS — N529 Male erectile dysfunction, unspecified: Secondary | ICD-10-CM

## 2019-06-12 ENCOUNTER — Other Ambulatory Visit: Payer: Self-pay

## 2019-06-12 NOTE — Telephone Encounter (Signed)
error 

## 2019-06-15 ENCOUNTER — Ambulatory Visit (INDEPENDENT_AMBULATORY_CARE_PROVIDER_SITE_OTHER): Payer: BC Managed Care – PPO | Admitting: Family Medicine

## 2019-06-15 ENCOUNTER — Other Ambulatory Visit: Payer: Self-pay

## 2019-06-15 ENCOUNTER — Encounter: Payer: Self-pay | Admitting: Family Medicine

## 2019-06-15 VITALS — BP 128/72 | HR 75 | Temp 97.1°F | Ht 73.0 in | Wt 183.2 lb

## 2019-06-15 DIAGNOSIS — M545 Low back pain, unspecified: Secondary | ICD-10-CM

## 2019-06-15 DIAGNOSIS — Z9884 Bariatric surgery status: Secondary | ICD-10-CM | POA: Diagnosis not present

## 2019-06-15 DIAGNOSIS — E559 Vitamin D deficiency, unspecified: Secondary | ICD-10-CM | POA: Diagnosis not present

## 2019-06-15 DIAGNOSIS — G2581 Restless legs syndrome: Secondary | ICD-10-CM | POA: Diagnosis not present

## 2019-06-15 DIAGNOSIS — E291 Testicular hypofunction: Secondary | ICD-10-CM | POA: Diagnosis not present

## 2019-06-15 DIAGNOSIS — E538 Deficiency of other specified B group vitamins: Secondary | ICD-10-CM | POA: Diagnosis not present

## 2019-06-15 MED ORDER — ROPINIROLE HCL 1 MG PO TABS: ORAL_TABLET | ORAL | 2 refills | Status: DC

## 2019-06-15 NOTE — Progress Notes (Signed)
Musculoskeletal Exam  Patient: Daniel [name] DOB: 1955-05-19  DOS: 06/15/2019  SUBJECTIVE:  Chief Complaint:   Chief Complaint  Patient presents with  . Abdominal Pain    4 to 6 weeks  . Back Pain    Daniel [name] is a 64 y.o.  male for evaluation and treatment of R lower back pain.   Onset:  1 day ago. No inj or change in activity.  Location: Right lower back Character:  aching  Progression of issue:  is unchanged Associated symptoms: No skin changes, swelling, redness, bruising, or loss of bowel/bladder function Treatment: to date has been OTC NSAIDS and heat.   Neurovascular symptoms: no 1 week ago, he had associated right lower quadrant abdominal pain and bloating.  He had constipation issues for the next 24-36 hours.  His bowel movements have been unremarkable since that time and they did not seem to affect his pain.  The patient has a history of gastric bypass and would like to have his labs checked.  He has a history of low iron and zinc.   Past Medical History:  Diagnosis Date  . Anal fistula    recurrent  . Benign localized prostatic hyperplasia with lower urinary tract symptoms (LUTS)   . Bilateral lower extremity edema    feet  . COPD with emphysema (HCC)   . Diverticulosis of colon   . ED (erectile dysfunction)   . Generalized anxiety disorder   . Hemorrhoids   . History of colon polyps   . History of diabetes mellitus, type II    10-10-2017  per pt no issues after wt loss surgery   . History of pneumothorax    1983 & 1988  right spontaneous, treated w/ chest tube  . Hyperlipidemia   . Hypertension   . MDD (major depressive disorder)   . OSA (obstructive sleep apnea)    10-10-2017  not used cpap since 2016 after wt loss surgery 2015  . Status post biliopancreatic diversion with duodenal switch    07-30-2013   @ [facility] for wt. loss surgery  . Vitamin D deficiency   . Wears glasses   . Zinc deficiency 04/27/2016    Objective: VITAL SIGNS: BP 128/72  (BP Location: Left Arm, Patient Position: Sitting, Cuff Size: Normal)   Pulse 75   Temp (!) 97.1 F (36.2 C) (Temporal)   Ht 6\' 1"  (1.854 m)   Wt 183 lb 4 oz (83.1 kg)   SpO2 98%   BMI 24.18 kg/m  Constitutional: Well formed, well developed. No acute distress. Cardiovascular: Brisk cap refill Thorax & Lungs: No accessory muscle use Musculoskeletal: low back.   Tenderness to palpation: no Deformity: no Ecchymosis: no Tests positive: none Tests negative: Straight leg b/l Flexibility of right hamstring is slightly worse than the left Neurologic: Normal sensory function. No focal deficits noted. DTR's equal and symmetric in LE's. No clonus. Psychiatric: Normal mood. Age appropriate judgment and insight. Alert & oriented x 3.    Assessment:  RLS (restless legs syndrome) - Plan: IBC + Ferritin  Right-sided low back pain without sciatica, unspecified chronicity  B12 deficiency - Plan: B12 and Folate Panel  History of bariatric surgery - Plan: Zinc, B12 and Folate Panel, Vitamin B1, Magnesium  Vitamin D deficiency - Plan: VITAMIN D 25 Hydroxy (Vit-D Deficiency, Fractures)  Plan: I think injury to deep muscle of his lower back.  There is no tenderness over the CVA, paraspinal musculature, or midline.  Recommended continued heat, anti-inflammatories, and  stretches and exercises.  Physical therapy if no improvement. We will follow-up on labs. F/u as originally scheduled. The patient voiced understanding and agreement to the plan.   Jilda Roche [place], DO 06/15/19  7:21 PM

## 2019-06-15 NOTE — Patient Instructions (Addendum)
Give [place] 4-5 business days to get the results of your labs back.   Heat (pad or rice pillow in microwave) over affected area, 10-15 minutes twice daily.   OK to take Tylenol 1000 mg (2 extra strength tabs) or 975 mg (3 regular strength tabs) every 6 hours as needed.  EXERCISES  RANGE OF MOTION (ROM) AND STRETCHING EXERCISES - Low Back Pain Most people with lower back pain will find that their symptoms get worse with excessive bending forward (flexion) or arching at the lower back (extension). The exercises that will help resolve your symptoms will focus on the opposite motion.  If you have pain, numbness or tingling which travels down into your buttocks, leg or foot, the goal of the therapy is for these symptoms to move closer to your back and eventually resolve. Sometimes, these leg symptoms will get better, but your lower back pain may worsen. This is often an indication of progress in your rehabilitation. Be very alert to any changes in your symptoms and the activities in which you participated in the 24 hours prior to the change. Sharing this information with your caregiver will allow him or her to most efficiently treat your condition. These exercises may help you when beginning to rehabilitate your injury. Your symptoms may resolve with or without further involvement from your physician, physical therapist or athletic trainer. While completing these exercises, remember:   Restoring tissue flexibility helps normal motion to return to the joints. This allows healthier, less painful movement and activity.  An effective stretch should be held for at least 30 seconds.  A stretch should never be painful. You should only feel a gentle lengthening or release in the stretched tissue. FLEXION RANGE OF MOTION AND STRETCHING EXERCISES:  STRETCH - Flexion, Single Knee to Chest   Lie on a firm bed or floor with both legs extended in front of you.  Keeping one leg in contact with the floor, bring your  opposite knee to your chest. Hold your leg in place by either grabbing behind your thigh or at your knee.  Pull until you feel a gentle stretch in your low back. Hold 30 seconds.  Slowly release your grasp and repeat the exercise with the opposite side. Repeat 2 times. Complete this exercise 3 times per week.   STRETCH - Flexion, Double Knee to Chest  Lie on a firm bed or floor with both legs extended in front of you.  Keeping one leg in contact with the floor, bring your opposite knee to your chest.  Tense your stomach muscles to support your back and then lift your other knee to your chest. Hold your legs in place by either grabbing behind your thighs or at your knees.  Pull both knees toward your chest until you feel a gentle stretch in your low back. Hold 30 seconds.  Tense your stomach muscles and slowly return one leg at a time to the floor. Repeat 2 times. Complete this exercise 3 times per week.   STRETCH - Low Trunk Rotation  Lie on a firm bed or floor. Keeping your legs in front of you, bend your knees so they are both pointed toward the ceiling and your feet are flat on the floor.  Extend your arms out to the side. This will stabilize your upper body by keeping your shoulders in contact with the floor.  Gently and slowly drop both knees together to one side until you feel a gentle stretch in your low back. Hold  for 30 seconds.  Tense your stomach muscles to support your lower back as you bring your knees back to the starting position. Repeat the exercise to the other side. Repeat 2 times. Complete this exercise at least 3 times per week.   EXTENSION RANGE OF MOTION AND FLEXIBILITY EXERCISES:  STRETCH - Extension, Prone on Elbows   Lie on your stomach on the floor, a bed will be too soft. Place your palms about shoulder width apart and at the height of your head.  Place your elbows under your shoulders. If this is too painful, stack pillows under your chest.  Allow  your body to relax so that your hips drop lower and make contact more completely with the floor.  Hold this position for 30 seconds.  Slowly return to lying flat on the floor. Repeat 2 times. Complete this exercise 3 times per week.   RANGE OF MOTION - Extension, Prone Press Ups  Lie on your stomach on the floor, a bed will be too soft. Place your palms about shoulder width apart and at the height of your head.  Keeping your back as relaxed as possible, slowly straighten your elbows while keeping your hips on the floor. You may adjust the placement of your hands to maximize your comfort. As you gain motion, your hands will come more underneath your shoulders.  Hold this position 30 seconds.  Slowly return to lying flat on the floor. Repeat 2 times. Complete this exercise 3 times per week.   RANGE OF MOTION- Quadruped, Neutral Spine   Assume a hands and knees position on a firm surface. Keep your hands under your shoulders and your knees under your hips. You may place padding under your knees for comfort.  Drop your head and point your tailbone toward the ground below you. This will round out your lower back like an angry cat. Hold this position for 30 seconds.  Slowly lift your head and release your tail bone so that your back sags into a large arch, like an old horse.  Hold this position for 30 seconds.  Repeat this until you feel limber in your low back.  Now, find your "sweet spot." This will be the most comfortable position somewhere between the two previous positions. This is your neutral spine. Once you have found this position, tense your stomach muscles to support your low back.  Hold this position for 30 seconds. Repeat 2 times. Complete this exercise 3 times per week.   STRENGTHENING EXERCISES - Low Back Sprain These exercises may help you when beginning to rehabilitate your injury. These exercises should be done near your "sweet spot." This is the neutral, low-back  arch, somewhere between fully rounded and fully arched, that is your least painful position. When performed in this safe range of motion, these exercises can be used for people who have either a flexion or extension based injury. These exercises may resolve your symptoms with or without further involvement from your physician, physical therapist or athletic trainer. While completing these exercises, remember:   Muscles can gain both the endurance and the strength needed for everyday activities through controlled exercises.  Complete these exercises as instructed by your physician, physical therapist or athletic trainer. Increase the resistance and repetitions only as guided.  You may experience muscle soreness or fatigue, but the pain or discomfort you are trying to eliminate should never worsen during these exercises. If this pain does worsen, stop and make certain you are following the directions exactly.  If the pain is still present after adjustments, discontinue the exercise until you can discuss the trouble with your caregiver.  STRENGTHENING - Deep Abdominals, Pelvic Tilt   Lie on a firm bed or floor. Keeping your legs in front of you, bend your knees so they are both pointed toward the ceiling and your feet are flat on the floor.  Tense your lower abdominal muscles to press your low back into the floor. This motion will rotate your pelvis so that your tail bone is scooping upwards rather than pointing at your feet or into the floor. With a gentle tension and even breathing, hold this position for 3 seconds. Repeat 2 times. Complete this exercise 3 times per week.   STRENGTHENING - Abdominals, Crunches   Lie on a firm bed or floor. Keeping your legs in front of you, bend your knees so they are both pointed toward the ceiling and your feet are flat on the floor. Cross your arms over your chest.  Slightly tip your chin down without bending your neck.  Tense your abdominals and slowly lift  your trunk high enough to just clear your shoulder blades. Lifting higher can put excessive stress on the lower back and does not further strengthen your abdominal muscles.  Control your return to the starting position. Repeat 2 times. Complete this exercise 3 times per week.   STRENGTHENING - Quadruped, Opposite UE/LE Lift   Assume a hands and knees position on a firm surface. Keep your hands under your shoulders and your knees under your hips. You may place padding under your knees for comfort.  Find your neutral spine and gently tense your abdominal muscles so that you can maintain this position. Your shoulders and hips should form a rectangle that is parallel with the floor and is not twisted.  Keeping your trunk steady, lift your right hand no higher than your shoulder and then your left leg no higher than your hip. Make sure you are not holding your breath. Hold this position for 30 seconds.  Continuing to keep your abdominal muscles tense and your back steady, slowly return to your starting position. Repeat with the opposite arm and leg. Repeat 2 times. Complete this exercise 3 times per week.   STRENGTHENING - Abdominals and Quadriceps, Straight Leg Raise   Lie on a firm bed or floor with both legs extended in front of you.  Keeping one leg in contact with the floor, bend the other knee so that your foot can rest flat on the floor.  Find your neutral spine, and tense your abdominal muscles to maintain your spinal position throughout the exercise.  Slowly lift your straight leg off the floor about 6 inches for a count of 3, making sure to not hold your breath.  Still keeping your neutral spine, slowly lower your leg all the way to the floor. Repeat this exercise with each leg 2 times. Complete this exercise 3 times per week.  POSTURE AND BODY MECHANICS CONSIDERATIONS - Low Back Sprain Keeping correct posture when sitting, standing or completing your activities will reduce the  stress put on different body tissues, allowing injured tissues a chance to heal and limiting painful experiences. The following are general guidelines for improved posture.  While reading these guidelines, remember:  The exercises prescribed by your provider will help you have the flexibility and strength to maintain correct postures.  The correct posture provides the best environment for your joints to work. All of your joints have less  wear and tear when properly supported by a spine with good posture. This means you will experience a healthier, less painful body.  Correct posture must be practiced with all of your activities, especially prolonged sitting and standing. Correct posture is as important when doing repetitive low-stress activities (typing) as it is when doing a single heavy-load activity (lifting).  RESTING POSITIONS Consider which positions are most painful for you when choosing a resting position. If you have pain with flexion-based activities (sitting, bending, stooping, squatting), choose a position that allows you to rest in a less flexed posture. You would want to avoid curling into a fetal position on your side. If your pain worsens with extension-based activities (prolonged standing, working overhead), avoid resting in an extended position such as sleeping on your stomach. Most people will find more comfort when they rest with their spine in a more neutral position, neither too rounded nor too arched. Lying on a non-sagging bed on your side with a pillow between your knees, or on your back with a pillow under your knees will often provide some relief. Keep in mind, being in any one position for a prolonged period of time, no matter how correct your posture, can still lead to stiffness.  PROPER SITTING POSTURE In order to minimize stress and discomfort on your spine, you must sit with correct posture. Sitting with good posture should be effortless for a healthy body. Returning to  good posture is a gradual process. Many people can work toward this most comfortably by using various supports until they have the flexibility and strength to maintain this posture on their own. When sitting with proper posture, your ears will fall over your shoulders and your shoulders will fall over your hips. You should use the back of the chair to support your upper back. Your lower back will be in a neutral position, just slightly arched. You may place a small pillow or folded towel at the base of your lower back for  support.  When working at a desk, create an environment that supports good, upright posture. Without extra support, muscles tire, which leads to excessive strain on joints and other tissues. Keep these recommendations in mind:  CHAIR:  A chair should be able to slide under your desk when your back makes contact with the back of the chair. This allows you to work closely.  The chair's height should allow your eyes to be level with the upper part of your monitor and your hands to be slightly lower than your elbows.  BODY POSITION  Your feet should make contact with the floor. If this is not possible, use a foot rest.  Keep your ears over your shoulders. This will reduce stress on your neck and low back.  INCORRECT SITTING POSTURES  If you are feeling tired and unable to assume a healthy sitting posture, do not slouch or slump. This puts excessive strain on your back tissues, causing more damage and pain. Healthier options include:  Using more support, like a lumbar pillow.  Switching tasks to something that requires you to be upright or walking.  Talking a brief walk.  Lying down to rest in a neutral-spine position.  PROLONGED STANDING WHILE SLIGHTLY LEANING FORWARD  When completing a task that requires you to lean forward while standing in one place for a long time, place either foot up on a stationary 2-4 inch high object to help maintain the best posture. When both  feet are on the ground, the lower  back tends to lose its slight inward curve. If this curve flattens (or becomes too large), then the back and your other joints will experience too much stress, tire more quickly, and can cause pain.  CORRECT STANDING POSTURES Proper standing posture should be assumed with all daily activities, even if they only take a few moments, like when brushing your teeth. As in sitting, your ears should fall over your shoulders and your shoulders should fall over your hips. You should keep a slight tension in your abdominal muscles to brace your spine. Your tailbone should point down to the ground, not behind your body, resulting in an over-extended swayback posture.   INCORRECT STANDING POSTURES  Common incorrect standing postures include a forward head, locked knees and/or an excessive swayback. WALKING Walk with an upright posture. Your ears, shoulders and hips should all line-up.  PROLONGED ACTIVITY IN A FLEXED POSITION When completing a task that requires you to bend forward at your waist or lean over a low surface, try to find a way to stabilize 3 out of 4 of your limbs. You can place a hand or elbow on your thigh or rest a knee on the surface you are reaching across. This will provide you more stability, so that your muscles do not tire as quickly. By keeping your knees relaxed, or slightly bent, you will also reduce stress across your lower back. CORRECT LIFTING TECHNIQUES  DO :  Assume a wide stance. This will provide you more stability and the opportunity to get as close as possible to the object which you are lifting.  Tense your abdominals to brace your spine. Bend at the knees and hips. Keeping your back locked in a neutral-spine position, lift using your leg muscles. Lift with your legs, keeping your back straight.  Test the weight of unknown objects before attempting to lift them.  Try to keep your elbows locked down at your sides in order get the best  strength from your shoulders when carrying an object.     Always ask for help when lifting heavy or awkward objects. INCORRECT LIFTING TECHNIQUES DO NOT:   Lock your knees when lifting, even if it is a small object.  Bend and twist. Pivot at your feet or move your feet when needing to change directions.  Assume that you can safely pick up even a paperclip without proper posture.

## 2019-06-16 LAB — MAGNESIUM: Magnesium: 1.7 mg/dL (ref 1.5–2.5)

## 2019-06-16 LAB — IBC + FERRITIN
Ferritin: 4.9 ng/mL — ABNORMAL LOW (ref 22.0–322.0)
Iron: 38 ug/dL — ABNORMAL LOW (ref 42–165)
Saturation Ratios: 7 % — ABNORMAL LOW (ref 20.0–50.0)
Transferrin: 389 mg/dL — ABNORMAL HIGH (ref 212.0–360.0)

## 2019-06-16 LAB — B12 AND FOLATE PANEL
Folate: 2.5 ng/mL — ABNORMAL LOW (ref >5.9)
Vitamin B-12: 394 pg/mL (ref 211–911)

## 2019-06-16 LAB — VITAMIN D 25 HYDROXY (VIT D DEFICIENCY, FRACTURES): VITD: 11.82 ng/mL — ABNORMAL LOW (ref 30.00–100.00)

## 2019-06-17 LAB — ZINC: Zinc: 68 ug/dL (ref 60–130)

## 2019-06-19 LAB — VITAMIN B1: Vitamin B1 (Thiamine): 65 nmol/L — ABNORMAL HIGH (ref 8–30)

## 2019-06-22 ENCOUNTER — Other Ambulatory Visit: Payer: Self-pay | Admitting: Family Medicine

## 2019-06-22 ENCOUNTER — Telehealth: Payer: Self-pay | Admitting: Family Medicine

## 2019-06-22 DIAGNOSIS — N3000 Acute cystitis without hematuria: Secondary | ICD-10-CM

## 2019-06-22 DIAGNOSIS — E538 Deficiency of other specified B group vitamins: Secondary | ICD-10-CM

## 2019-06-22 DIAGNOSIS — E611 Iron deficiency: Secondary | ICD-10-CM

## 2019-06-22 NOTE — Telephone Encounter (Signed)
I spoke to the patient this morning (06/22/19) and he stated that on his my chart he was notified of a no show appointment. He states that in January 2021 he had 2 Virtual visits with another provider in the office, not PCP. Both of those visits he did show.  He would like the no show removed and requested that the manager call him (on his cell phone [phone])  once this is removed.

## 2019-06-22 NOTE — Telephone Encounter (Signed)
Not sure what this is referencing or why it was routed to me. TY.

## 2019-06-22 NOTE — Telephone Encounter (Signed)
Sorry for that was only to go to [employer].

## 2019-06-30 ENCOUNTER — Other Ambulatory Visit: Payer: Self-pay

## 2019-06-30 ENCOUNTER — Other Ambulatory Visit (INDEPENDENT_AMBULATORY_CARE_PROVIDER_SITE_OTHER): Payer: BC Managed Care – PPO

## 2019-06-30 DIAGNOSIS — N3000 Acute cystitis without hematuria: Secondary | ICD-10-CM | POA: Diagnosis not present

## 2019-06-30 DIAGNOSIS — E538 Deficiency of other specified B group vitamins: Secondary | ICD-10-CM | POA: Diagnosis not present

## 2019-06-30 DIAGNOSIS — E611 Iron deficiency: Secondary | ICD-10-CM

## 2019-07-01 LAB — URINALYSIS, MICROSCOPIC ONLY

## 2019-07-01 LAB — IRON: Iron: 27 ug/dL — ABNORMAL LOW (ref 42–165)

## 2019-07-01 LAB — FOLATE: Folate: 3.2 ng/mL — ABNORMAL LOW (ref >5.9)

## 2019-07-03 ENCOUNTER — Other Ambulatory Visit: Payer: Self-pay | Admitting: Family Medicine

## 2019-07-03 DIAGNOSIS — E611 Iron deficiency: Secondary | ICD-10-CM

## 2019-07-03 DIAGNOSIS — E538 Deficiency of other specified B group vitamins: Secondary | ICD-10-CM

## 2019-07-03 NOTE — Progress Notes (Signed)
FE

## 2019-07-03 NOTE — Progress Notes (Signed)
i

## 2019-07-14 ENCOUNTER — Encounter: Payer: Self-pay | Admitting: Family Medicine

## 2019-07-14 ENCOUNTER — Other Ambulatory Visit: Payer: Self-pay

## 2019-07-14 ENCOUNTER — Other Ambulatory Visit: Payer: Self-pay | Admitting: Family Medicine

## 2019-07-14 ENCOUNTER — Ambulatory Visit (INDEPENDENT_AMBULATORY_CARE_PROVIDER_SITE_OTHER): Payer: BC Managed Care – PPO | Admitting: Family Medicine

## 2019-07-14 ENCOUNTER — Telehealth: Payer: Self-pay

## 2019-07-14 VITALS — BP 128/80 | HR 59 | Temp 96.6°F | Ht 73.0 in | Wt 181.4 lb

## 2019-07-14 DIAGNOSIS — L0291 Cutaneous abscess, unspecified: Secondary | ICD-10-CM

## 2019-07-14 MED ORDER — CEPHALEXIN 500 MG PO CAPS: 500.0000 mg | ORAL_CAPSULE | Freq: Three times a day (TID) | ORAL | 0 refills | Status: DC

## 2019-07-14 MED ORDER — DIAZEPAM 5 MG PO TABS: ORAL_TABLET | ORAL | 2 refills | Status: DC

## 2019-07-14 MED ORDER — VITAMIN D (ERGOCALCIFEROL) 1.25 MG (50000 UNIT) PO CAPS: ORAL_CAPSULE | ORAL | 1 refills | Status: DC

## 2019-07-14 NOTE — Progress Notes (Signed)
Chief Complaint  Patient presents with  . Abscess    left leg    Daniel [name] is a 64 y.o. male here for a skin complaint.  Duration: 2 weeks Location: L inner thigh Pruritic? No Painful? Yes Drainage? Yes- drained yesterday New soaps/lotions/topicals/detergents? No Sick contacts? No Other associated symptoms: none Therapies tried thus far: Tylenol   Past Medical History:  Diagnosis Date  . Anal fistula    recurrent  . Benign localized prostatic hyperplasia with lower urinary tract symptoms (LUTS)   . Bilateral lower extremity edema    feet  . COPD with emphysema (HCC)   . Diverticulosis of colon   . ED (erectile dysfunction)   . Generalized anxiety disorder   . Hemorrhoids   . History of colon polyps   . History of diabetes mellitus, type II    10-10-2017  per pt no issues after wt loss surgery   . History of pneumothorax    1983 & 1988  right spontaneous, treated w/ chest tube  . Hyperlipidemia   . Hypertension   . MDD (major depressive disorder)   . OSA (obstructive sleep apnea)    10-10-2017  not used cpap since 2016 after wt loss surgery 2015  . Status post biliopancreatic diversion with duodenal switch    07-30-2013   @ [facility] for wt. loss surgery  . Vitamin D deficiency   . Wears glasses   . Zinc deficiency 04/27/2016    BP 128/80 (BP Location: Left Arm, Patient Position: Sitting, Cuff Size: Normal)   Pulse (!) 59   Temp (!) 96.6 F (35.9 C) (Temporal)   Ht 6\' 1"  (1.854 m)   Wt 181 lb 6 oz (82.3 kg)   SpO2 96%   BMI 23.93 kg/m  Gen: awake, alert, appearing stated age Lungs: No accessory muscle use Skin: L inner thigh proximal to hip, there is an elevated and purplish/pink area measuring approx 3 cm x 4 cm in diameter, it is indurated and ttp w minimal purulence coming from a central opening; it is not excessively warm or erythematous. No scaling or excoriation.  Psych: Age appropriate judgment and insight  Abscess - Plan: cephALEXin (KEFLEX) 500 MG  capsule    I think it has opened on its own and does not need I&D today. Doxy did not do well so change to Keflex for better strep coverage. Tylenol, ice, activity as tolerated. Warning signs and symptoms verbalized and written down in AVS.  F/u prn. The patient voiced understanding and agreement to the plan.  [place], DO 07/14/19 12:11 PM

## 2019-07-14 NOTE — Telephone Encounter (Signed)
Last OV---today 07/14/2019 Last RF---11/14/2018   #90 with 1 refill CSC/UDS done on 02/28/2018

## 2019-07-14 NOTE — Patient Instructions (Signed)
When you do wash it, use only soap and water. Do not vigorously scrub. Keep the area clean and dry.   Things to look out for: increasing pain not relieved by ibuprofen/acetaminophen, fevers, spreading redness, increased drainage of pus, or foul odor.  Let us know if you need anything.

## 2019-07-14 NOTE — Telephone Encounter (Signed)
Patient Pharmacy called in to request for a medication refill for  diazepam (VALIUM) 5 MG tablet [[id number]]    Please send it to [facility] [address], [place] - [address]  [address] [place], [place] [address]  Phone:  [phone] Fax:  [phone]  DEA #:  --

## 2019-07-17 ENCOUNTER — Encounter: Payer: Self-pay | Admitting: Family Medicine

## 2019-07-17 ENCOUNTER — Other Ambulatory Visit: Payer: Self-pay

## 2019-07-17 ENCOUNTER — Ambulatory Visit (INDEPENDENT_AMBULATORY_CARE_PROVIDER_SITE_OTHER): Payer: BC Managed Care – PPO | Admitting: Family Medicine

## 2019-07-17 VITALS — BP 124/80 | HR 66 | Temp 95.2°F | Wt 184.0 lb

## 2019-07-17 DIAGNOSIS — L0291 Cutaneous abscess, unspecified: Secondary | ICD-10-CM | POA: Diagnosis not present

## 2019-07-17 NOTE — Patient Instructions (Signed)
Do not shower for the rest of the day. When you do wash it, use only soap and water. Do not vigorously scrub. Apply triple antibiotic ointment (like Neosporin) twice daily. Keep the area clean and dry.   Things to look out for: increasing pain not relieved by ibuprofen/acetaminophen, fevers, spreading redness, drainage of pus, or foul odor.  I would reach out to your urologist to see if they can remove this when it is healed. If not, we can place the official referral so your surgeon can take care of it.   If the packing falls out, no worries.   Let us know if you need anything.

## 2019-07-17 NOTE — Progress Notes (Signed)
Chief Complaint  Patient presents with  . Abscess    Daniel [name] is a 64 y.o. male here for a skin complaint.  Duration: 1 day Location: L portion of scrotum Pruritic? No Painful? Yes Drainage? Yes New soaps/lotions/topicals/detergents? No Sick contacts? No Other associated symptoms: none Therapies tried thus far: He is on abx for another abscess  Past Medical History:  Diagnosis Date  . Anal fistula    recurrent  . Benign localized prostatic hyperplasia with lower urinary tract symptoms (LUTS)   . Bilateral lower extremity edema    feet  . COPD with emphysema (HCC)   . Diverticulosis of colon   . ED (erectile dysfunction)   . Generalized anxiety disorder   . Hemorrhoids   . History of colon polyps   . History of diabetes mellitus, type II    10-10-2017  per pt no issues after wt loss surgery   . History of pneumothorax    1983 & 1988  right spontaneous, treated w/ chest tube  . Hyperlipidemia   . Hypertension   . MDD (major depressive disorder)   . OSA (obstructive sleep apnea)    10-10-2017  not used cpap since 2016 after wt loss surgery 2015  . Status post biliopancreatic diversion with duodenal switch    07-30-2013   @ [facility] for wt. loss surgery  . Vitamin D deficiency   . Wears glasses   . Zinc deficiency 04/27/2016    BP 124/80 (BP Location: Left Arm, Patient Position: Sitting, Cuff Size: Normal)   Pulse 66   Temp (!) 95.2 F (35.1 C) (Temporal)   Wt 184 lb (83.5 kg)   SpO2 98%   BMI 24.28 kg/m  Gen: awake, alert, appearing stated age Lungs: No accessory muscle use Skin: fluctuant circular region with warmth over L portion of scrotum. +purulent drainage and ttp Psych: Age appropriate judgment and insight  Procedure note; incision and drainage Informed consent obtained. The area was cleaned with alcohol. The area was anesthetized with 2 mL of 1% lidocaine with epinephrine. Once adequate anesthesia was obtained, a cruciate incision was made with 11  blade scalpel. Approximately 8 mL of purulent material with blood was expressed. Loculations were interrupted with a curved hemostat. The area was packed with approximately 4 cm of 0.25 in iodoform gauze. The area was then dressed with gauze. There were no complications noted. The patient tolerated the procedure well.  Abscess - Plan: PR DRAIN SKIN ABSCESS SIMPLE  Cont abx from Wed. Ice. Aftercare instructions verbalized and written down. Pull packing in 1 week.  The patient voiced understanding and agreement to the plan.  Jilda Roche [place], DO 07/17/19 11:29 AM

## 2019-07-19 ENCOUNTER — Other Ambulatory Visit: Payer: Self-pay | Admitting: Family Medicine

## 2019-07-19 DIAGNOSIS — N529 Male erectile dysfunction, unspecified: Secondary | ICD-10-CM

## 2019-07-20 ENCOUNTER — Other Ambulatory Visit: Payer: Self-pay

## 2019-07-20 ENCOUNTER — Other Ambulatory Visit (INDEPENDENT_AMBULATORY_CARE_PROVIDER_SITE_OTHER): Payer: BC Managed Care – PPO

## 2019-07-20 DIAGNOSIS — E611 Iron deficiency: Secondary | ICD-10-CM | POA: Diagnosis not present

## 2019-07-20 DIAGNOSIS — E538 Deficiency of other specified B group vitamins: Secondary | ICD-10-CM | POA: Diagnosis not present

## 2019-07-20 LAB — FOLATE: Folate: 3.5 ng/mL — ABNORMAL LOW (ref >5.9)

## 2019-07-20 LAB — IRON: Iron: 135 ug/dL (ref 42–165)

## 2019-07-21 ENCOUNTER — Telehealth: Payer: Self-pay | Admitting: Family Medicine

## 2019-07-21 ENCOUNTER — Other Ambulatory Visit: Payer: Self-pay | Admitting: Family Medicine

## 2019-07-21 DIAGNOSIS — E538 Deficiency of other specified B group vitamins: Secondary | ICD-10-CM

## 2019-07-21 MED ORDER — LISINOPRIL 2.5 MG PO TABS: 2.5000 mg | ORAL_TABLET | Freq: Every day | ORAL | 3 refills | Status: DC

## 2019-07-21 NOTE — Telephone Encounter (Signed)
Refill done.  

## 2019-07-21 NOTE — Telephone Encounter (Signed)
Medication: lisinopril (ZESTRIL) 2.5 MG tablet   Has the patient contacted their pharmacy? Yes.   (If no, request that the patient contact the pharmacy for the refill.) (If yes, when and what did the pharmacy advise?)  Preferred Pharmacy (with phone number or street name):  [facility] [address], [place] - [address] Phone:  [phone]  Fax:  [phone]       Agent: Please be advised that RX refills may take up to 3 business days. We ask that you follow-up with your pharmacy.

## 2019-08-19 ENCOUNTER — Encounter: Payer: Self-pay | Admitting: Family Medicine

## 2019-08-19 ENCOUNTER — Ambulatory Visit (INDEPENDENT_AMBULATORY_CARE_PROVIDER_SITE_OTHER): Payer: BC Managed Care – PPO | Admitting: Family Medicine

## 2019-08-19 ENCOUNTER — Other Ambulatory Visit: Payer: Self-pay

## 2019-08-19 VITALS — BP 132/84 | HR 89 | Temp 96.0°F | Ht 73.0 in | Wt 181.4 lb

## 2019-08-19 DIAGNOSIS — L0291 Cutaneous abscess, unspecified: Secondary | ICD-10-CM

## 2019-08-19 DIAGNOSIS — F411 Generalized anxiety disorder: Secondary | ICD-10-CM | POA: Diagnosis not present

## 2019-08-19 DIAGNOSIS — L723 Sebaceous cyst: Secondary | ICD-10-CM

## 2019-08-19 DIAGNOSIS — F330 Major depressive disorder, recurrent, mild: Secondary | ICD-10-CM | POA: Diagnosis not present

## 2019-08-19 NOTE — Patient Instructions (Signed)
Give me 1 week to research about an antibiotic for you.  Don't worry about the cyst on your though.  To prepare a bleach bath, one-fourth to one-half cup of bleach is placed in a full bathtub (about 40 gallons) of water. Bleach baths are usually taken for 5 to 10 minutes twice per week and should be followed by application of an emollient.  Let us know if you need anything.

## 2019-08-19 NOTE — Progress Notes (Signed)
Chief Complaint  Patient presents with  . Follow-up    continued cyst    Daniel [name] is a 64 y.o. male here for a skin complaint.  Patient was seen for an abscess on his left thigh several weeks ago.  It had drained by itself.  It is better overall and there is no pain or drainage.  There is a central mass that he is concerned about.  He has not been putting anything over the top of it.  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  He had a procedure done on it that failed.  He saw a colorectal specialist who said that any procedure would either be experimental or require cutting his sphincter making him incontinent.  The experimental procedure worked for 6 months and then failed.  He does not wish to become incontinent of stool.  He was prescribed rifampin for recurrent abscesses from this when he was in another area.  He is hopeful to go back on this or another option other than doxycycline which has not been helpful.  Past Medical History:  Diagnosis Date  . Anal fistula    recurrent  . Benign localized prostatic hyperplasia with lower urinary tract symptoms (LUTS)   . Bilateral lower extremity edema    feet  . COPD with emphysema (HCC)   . Diverticulosis of colon   . ED (erectile dysfunction)   . Generalized anxiety disorder   . Hemorrhoids   . History of colon polyps   . History of diabetes mellitus, type II    10-10-2017  per pt no issues after wt loss surgery   . History of pneumothorax    1983 & 1988  right spontaneous, treated w/ chest tube  . Hyperlipidemia   . Hypertension   . MDD (major depressive disorder)   . OSA (obstructive sleep apnea)    10-10-2017  not used cpap since 2016 after wt loss surgery 2015  . Status post biliopancreatic diversion with duodenal switch    07-30-2013   @ [facility] for wt. loss surgery  . Vitamin D deficiency   . Wears glasses   . Zinc deficiency 04/27/2016    BP 132/84 (BP Location: Left Arm, Patient  Position: Sitting, Cuff Size: Normal)   Pulse 89   Temp (!) 96 F (35.6 C) (Temporal)   Ht 6\' 1"  (1.854 m)   Wt 181 lb 6 oz (82.3 kg)   SpO2 98%   BMI 23.93 kg/m  Gen: awake, alert, appearing stated age Lungs: No accessory muscle use Skin: No rectal abscesses -No erythema, fluctuance, ttp, excessive warmth, drainage.  There is a sebaceous cyst with an opening over the L prox medial thigh. No erythema, fluctuance, ttp, excessive warmth, drainage.  Psych: Age appropriate judgment and insight  Abscess  Sebaceous cyst  Will look into Rifampin. Surgery no longer option. Sees derm, does not wish for a 2nd opinion or to see ID.  Reassurance, if it becomes an issue, can see gen surgery.  F/u prn. The patient voiced understanding and agreement to the plan.  [place], DO 08/19/19 2:57 PM

## 2019-08-23 ENCOUNTER — Other Ambulatory Visit: Payer: Self-pay | Admitting: Family Medicine

## 2019-08-23 DIAGNOSIS — N529 Male erectile dysfunction, unspecified: Secondary | ICD-10-CM

## 2019-08-24 NOTE — Telephone Encounter (Signed)
Last OV---08/19/2019 Last RF--#30 with 5 refills on 02/11/2019 UDS/CSC---02/28/2018

## 2019-09-18 ENCOUNTER — Other Ambulatory Visit: Payer: Self-pay | Admitting: Family Medicine

## 2019-09-23 ENCOUNTER — Other Ambulatory Visit: Payer: Self-pay | Admitting: Family Medicine

## 2019-09-23 DIAGNOSIS — N529 Male erectile dysfunction, unspecified: Secondary | ICD-10-CM

## 2019-09-30 ENCOUNTER — Other Ambulatory Visit: Payer: Self-pay | Admitting: Family Medicine

## 2019-09-30 DIAGNOSIS — N529 Male erectile dysfunction, unspecified: Secondary | ICD-10-CM

## 2019-10-05 ENCOUNTER — Ambulatory Visit (INDEPENDENT_AMBULATORY_CARE_PROVIDER_SITE_OTHER): Payer: BC Managed Care – PPO | Admitting: Family Medicine

## 2019-10-05 ENCOUNTER — Encounter: Payer: Self-pay | Admitting: Family Medicine

## 2019-10-05 ENCOUNTER — Other Ambulatory Visit: Payer: Self-pay

## 2019-10-05 VITALS — BP 110/72 | HR 73 | Temp 98.3°F | Ht 73.0 in | Wt 179.4 lb

## 2019-10-05 DIAGNOSIS — R0789 Other chest pain: Secondary | ICD-10-CM

## 2019-10-05 DIAGNOSIS — G253 Myoclonus: Secondary | ICD-10-CM | POA: Diagnosis not present

## 2019-10-05 MED ORDER — AMPHETAMINE-DEXTROAMPHETAMINE 20 MG PO TABS: 20.0000 mg | ORAL_TABLET | Freq: Two times a day (BID) | ORAL | 0 refills | Status: AC

## 2019-10-05 MED ORDER — TESTOSTERONE CYPIONATE 200 MG/ML IM SOLN: 200.0000 mg | INTRAMUSCULAR | 0 refills | Status: DC

## 2019-10-05 NOTE — Patient Instructions (Addendum)
Don't worry about either of these things.  If anything new arises, let me know.   Let us know if you need anything.

## 2019-10-05 NOTE — Progress Notes (Signed)
Chief Complaint  Patient presents with  . Abdominal Pain    restless legs    Subjective: Patient is a 64 y.o. male here for upper abd pain.  1 week ago, had a soreness in epigastric region when he touched it. Only bothersome when he touched it. No N/V/D, not affected by meals beverages. Does not bother him over past 3 d. No wt loss, fevers, bleeding, nighttime awakenings, neurologic s/s's. Did not try anything at home.   Intermittently will have jerking of his leg in the evening when he forgets to take his Requip. No new balance issues, weakness, confusion. No changes in hydration.   Past Medical History:  Diagnosis Date  . Anal fistula    recurrent  . Benign localized prostatic hyperplasia with lower urinary tract symptoms (LUTS)   . Bilateral lower extremity edema    feet  . COPD with emphysema (HCC)   . Diverticulosis of colon   . ED (erectile dysfunction)   . Generalized anxiety disorder   . Hemorrhoids   . History of colon polyps   . History of diabetes mellitus, type II    10-10-2017  per pt no issues after wt loss surgery   . History of pneumothorax    1983 & 1988  right spontaneous, treated w/ chest tube  . Hyperlipidemia   . Hypertension   . MDD (major depressive disorder)   . OSA (obstructive sleep apnea)    10-10-2017  not used cpap since 2016 after wt loss surgery 2015  . Status post biliopancreatic diversion with duodenal switch    07-30-2013   @ [facility] for wt. loss surgery  . Vitamin D deficiency   . Wears glasses   . Zinc deficiency 04/27/2016    Objective: BP 110/72 (BP Location: Left Arm, Patient Position: Sitting, Cuff Size: Normal)   Pulse 73   Temp 98.3 F (36.8 C) (Oral)   Ht 6\' 1"  (1.854 m)   Wt 179 lb 6 oz (81.4 kg)   SpO2 98%   BMI 23.67 kg/m  General: Awake, appears stated age Heart: RRR Lungs: CTAB, no rales, wheezes or rhonchi. No accessory muscle use Neuro: DTR's equal and symmetric, gait normal Abd: BS+, S, NT, ND; midline over xiphoid  is where he states the soreness was. No ttp, erythema, fluctuance, edema noted.  Psych: Age appropriate judgment and insight, normal affect and mood  Assessment and Plan: Xiphoidalgia  Myoclonic jerking  1. Resolved. Reassurance 2. Reassurance, cont Requip.  F/u as originally scheduled.  The patient voiced understanding and agreement to the plan.  [place], DO 10/05/19  8:43 AM

## 2019-10-07 ENCOUNTER — Ambulatory Visit (HOSPITAL_BASED_OUTPATIENT_CLINIC_OR_DEPARTMENT_OTHER)
Admission: RE | Admit: 2019-10-07 | Discharge: 2019-10-07 | Disposition: A | Discharge status: Home / Self Care | Payer: BC Managed Care – PPO | Source: Ambulatory Visit | Attending: Family Medicine | Admitting: Family Medicine

## 2019-10-07 ENCOUNTER — Encounter: Payer: Self-pay | Admitting: Family Medicine

## 2019-10-07 ENCOUNTER — Other Ambulatory Visit: Payer: Self-pay

## 2019-10-07 ENCOUNTER — Ambulatory Visit (INDEPENDENT_AMBULATORY_CARE_PROVIDER_SITE_OTHER): Payer: BC Managed Care – PPO | Admitting: Family Medicine

## 2019-10-07 VITALS — BP 142/90 | HR 97 | Temp 98.5°F | Ht 73.0 in | Wt 181.1 lb

## 2019-10-07 DIAGNOSIS — M79675 Pain in left toe(s): Secondary | ICD-10-CM | POA: Diagnosis not present

## 2019-10-07 DIAGNOSIS — S99922A Unspecified injury of left foot, initial encounter: Secondary | ICD-10-CM | POA: Diagnosis not present

## 2019-10-07 NOTE — Patient Instructions (Addendum)
Ice/cold pack over area for 10-15 min twice daily.  OK to take Tylenol 1000 mg (2 extra strength tabs) or 975 mg (3 regular strength tabs) every 6 hours as needed.  Ibuprofen 400-600 mg (2-3 over the counter strength tabs) every 6 hours as needed for pain.  Wear the flat shoe when you are walking outside the home and if you are standing/walking for prolonged periods at home (like cleaning, cooking, etc). No need to wear if you are getting up to go to the bathroom or short periods unless you wish.  Let us know if you need anything.

## 2019-10-07 NOTE — Progress Notes (Signed)
Musculoskeletal Exam  Patient: Daniel [name] DOB: 1955/07/07  DOS: 10/07/2019  SUBJECTIVE:  Chief Complaint:   Chief Complaint  Patient presents with  . Toe Pain    left toe second to big toe    [name] is a 64 y.o.  male for evaluation and treatment of L 4th toe pain.   Onset:  1 day ago. No inj or change in activity.  Location: 4th toe distally Character:  sharp  Progression of issue:  is unchanged Associated symptoms: bruising, swelling Treatment: to date has been none.   Neurovascular symptoms: no  Past Medical History:  Diagnosis Date  . Anal fistula    recurrent  . Benign localized prostatic hyperplasia with lower urinary tract symptoms (LUTS)   . Bilateral lower extremity edema    feet  . COPD with emphysema (HCC)   . Diverticulosis of colon   . ED (erectile dysfunction)   . Generalized anxiety disorder   . Hemorrhoids   . History of colon polyps   . History of diabetes mellitus, type II    10-10-2017  per pt no issues after wt loss surgery   . History of pneumothorax    1983 & 1988  right spontaneous, treated w/ chest tube  . Hyperlipidemia   . Hypertension   . MDD (major depressive disorder)   . OSA (obstructive sleep apnea)    10-10-2017  not used cpap since 2016 after wt loss surgery 2015  . Status post biliopancreatic diversion with duodenal switch    07-30-2013   @ [facility] for wt. loss surgery  . Vitamin D deficiency   . Wears glasses   . Zinc deficiency 04/27/2016    Objective: VITAL SIGNS: BP (!) 142/90 (BP Location: Left Arm, Patient Position: Sitting, Cuff Size: Normal)   Pulse 97   Temp 98.5 F (36.9 C) (Oral)   Ht 6\' 1"  (1.854 m)   Wt 181 lb 2 oz (82.2 kg)   SpO2 97%   BMI 23.90 kg/m  Constitutional: Well formed, well developed. No acute distress. Thorax & Lungs: No accessory muscle use Musculoskeletal: 4th toe on L.   Normal active range of motion: No   Normal passive range of motion: yes Tenderness to palpation: yes over IP  and DP Deformity: no Ecchymosis: yes I do not appreciate any crepitus.  Neurologic: Normal sensory function. Psychiatric: Normal mood. Age appropriate judgment and insight. Alert & oriented x 3.    Assessment:  Pain of toe of left foot - Plan: DG Toe 4th Left  Plan: Ice, Tylenol, flat shoe.  XR shows possible prox distal phalanx fx laterally, await official read.  F/u pending read, if + for fx, will have him return in 2-3 weeks. The patient voiced understanding and agreement to the plan.   [place], DO 10/07/19  7:48 AM

## 2019-10-23 ENCOUNTER — Encounter: Payer: Self-pay | Admitting: Family Medicine

## 2019-10-23 ENCOUNTER — Ambulatory Visit (INDEPENDENT_AMBULATORY_CARE_PROVIDER_SITE_OTHER): Payer: BC Managed Care – PPO | Admitting: Family Medicine

## 2019-10-23 ENCOUNTER — Other Ambulatory Visit: Payer: Self-pay

## 2019-10-23 VITALS — BP 118/80 | HR 67 | Temp 97.9°F | Ht 73.0 in | Wt 179.0 lb

## 2019-10-23 DIAGNOSIS — R82998 Other abnormal findings in urine: Secondary | ICD-10-CM | POA: Diagnosis not present

## 2019-10-23 LAB — POC URINALSYSI DIPSTICK (AUTOMATED)
Bilirubin, UA: NEGATIVE
Glucose, UA: NEGATIVE
Ketones, UA: POSITIVE
Nitrite, UA: NEGATIVE
Protein, UA: POSITIVE — AB
Spec Grav, UA: >=1.03 — AB (ref 1.010–1.025)
Urobilinogen, UA: 0.2 E.U./dL
pH, UA: 5 (ref 5.0–8.0)

## 2019-10-23 LAB — COMPREHENSIVE METABOLIC PANEL
ALT: 25 U/L (ref 0–53)
AST: 23 U/L (ref 0–37)
Albumin: 3.6 g/dL (ref 3.5–5.2)
Alkaline Phosphatase: 122 U/L — ABNORMAL HIGH (ref 39–117)
BUN: 10 mg/dL (ref 6–23)
CO2: 28 mEq/L (ref 19–32)
Calcium: 8.5 mg/dL (ref 8.4–10.5)
Chloride: 109 mEq/L (ref 96–112)
Creatinine, Ser: 0.91 mg/dL (ref 0.40–1.50)
GFR: 84.01 mL/min (ref >60.00)
Glucose, Bld: 89 mg/dL (ref 70–99)
Potassium: 3.9 mEq/L (ref 3.5–5.1)
Sodium: 139 mEq/L (ref 135–145)
Total Bilirubin: 0.4 mg/dL (ref 0.2–1.2)
Total Protein: 5.9 g/dL — ABNORMAL LOW (ref 6.0–8.3)

## 2019-10-23 MED ORDER — SULFAMETHOXAZOLE-TRIMETHOPRIM 800-160 MG PO TABS
ORAL_TABLET | ORAL | 0 refills | Status: DC
Start: 2019-10-23 — End: 2020-01-20

## 2019-10-23 MED ORDER — CEPHALEXIN 500 MG PO CAPS
500.0000 mg | ORAL_CAPSULE | Freq: Three times a day (TID) | ORAL | 0 refills | Status: DC
Start: 2019-10-23 — End: 2020-01-20

## 2019-10-23 NOTE — Patient Instructions (Signed)
Give [place] 2-3 business days to get the results of your labs back.   Stay hydrated- 60-65 oz of fluid daily.  Let us know if you need anything.

## 2019-10-23 NOTE — Progress Notes (Signed)
Chief Complaint  Patient presents with  . Follow-up    urine problem    Subjective: Patient is a 64 y.o. male here for dark urine.  6 days ago, the patient started experiencing dark brown urine.  He is not having any pain, fevers, abdominal pain, bowel changes, blood in the urine that he can see, discharge, testicular pain, frequency, urgency, or incomplete emptying.  He denies any change in oral intake of fluids or exercising.  He denies any trauma.  No changes in medications.  Past Medical History:  Diagnosis Date  . Anal fistula    recurrent  . Benign localized prostatic hyperplasia with lower urinary tract symptoms (LUTS)   . Bilateral lower extremity edema    feet  . COPD with emphysema (HCC)   . Diverticulosis of colon   . ED (erectile dysfunction)   . Generalized anxiety disorder   . Hemorrhoids   . History of colon polyps   . History of diabetes mellitus, type II    10-10-2017  per pt no issues after wt loss surgery   . History of pneumothorax    1983 & 1988  right spontaneous, treated w/ chest tube  . Hyperlipidemia   . Hypertension   . MDD (major depressive disorder)   . OSA (obstructive sleep apnea)    10-10-2017  not used cpap since 2016 after wt loss surgery 2015  . Status post biliopancreatic diversion with duodenal switch    07-30-2013   @ [facility] for wt. loss surgery  . Vitamin D deficiency   . Wears glasses   . Zinc deficiency 04/27/2016    Objective: BP 118/80 (BP Location: Left Arm, Patient Position: Sitting, Cuff Size: Normal)   Pulse 67   Temp 97.9 F (36.6 C) (Oral)   Ht 6\' 1"  (1.854 m)   Wt 179 lb (81.2 kg)   SpO2 97%   BMI 23.62 kg/m  General: Awake, appears stated age MSK: No flank tenderness bilaterally Heart: RRR Abdomen: Soft, nontender, nondistended, no masses or organomegaly Lungs: CTAB, no rales, wheezes or rhonchi. No accessory muscle use Psych: Age appropriate judgment and insight, normal affect and mood  Assessment and Plan: Dark  brown urine - Plan: Comprehensive metabolic panel, POCT Urinalysis Dipstick (Automated), Urine Culture, Urine Microscopic Only  Orders as above.  Empirically send in trimethoprim-sulfamethoxazole should he start having burning or other UTI symptoms.  I would like to see his culture otherwise.  We will check microscopy as there was blood on the UA.  We will also check renal and hepatic function.  We will hold off on checking creatinine kinase as he is not having any pain and denies any history suggestive of rhabdomyolysis. The patient voiced understanding and agreement to the plan.  [place], DO 10/23/19  2:33 PM

## 2019-10-25 ENCOUNTER — Other Ambulatory Visit: Payer: Self-pay | Admitting: Family Medicine

## 2019-10-25 LAB — URINE CULTURE
MICRO NUMBER:: 10796953
SPECIMEN QUALITY:: ADEQUATE

## 2019-10-26 ENCOUNTER — Other Ambulatory Visit: Payer: Self-pay | Admitting: Family Medicine

## 2019-10-26 DIAGNOSIS — R748 Abnormal levels of other serum enzymes: Secondary | ICD-10-CM

## 2019-11-02 ENCOUNTER — Other Ambulatory Visit: Payer: Self-pay | Admitting: Family Medicine

## 2019-11-03 ENCOUNTER — Other Ambulatory Visit: Payer: Self-pay | Admitting: Family Medicine

## 2019-11-03 DIAGNOSIS — L649 Androgenic alopecia, unspecified: Secondary | ICD-10-CM

## 2019-11-08 ENCOUNTER — Other Ambulatory Visit: Payer: Self-pay | Admitting: Family Medicine

## 2019-11-17 ENCOUNTER — Other Ambulatory Visit: Payer: Self-pay | Admitting: Family Medicine

## 2019-12-02 ENCOUNTER — Encounter (HOSPITAL_COMMUNITY)
Admission: RE | Admit: 2019-12-02 | Discharge: 2019-12-02 | Disposition: A | Discharge status: Home / Self Care | Payer: BC Managed Care – PPO | Source: Ambulatory Visit | Attending: Family Medicine | Admitting: Family Medicine

## 2019-12-02 ENCOUNTER — Other Ambulatory Visit: Payer: Self-pay

## 2019-12-02 DIAGNOSIS — R748 Abnormal levels of other serum enzymes: Secondary | ICD-10-CM | POA: Insufficient documentation

## 2019-12-02 MED ORDER — TECHNETIUM TC 99M MEDRONATE IV KIT
20.0000 | PACK | Freq: Once | INTRAVENOUS | Status: AC | PRN
  Administered 2019-12-02: 20 via INTRAVENOUS

## 2019-12-19 ENCOUNTER — Other Ambulatory Visit: Payer: Self-pay | Admitting: Family Medicine

## 2019-12-19 DIAGNOSIS — N529 Male erectile dysfunction, unspecified: Secondary | ICD-10-CM

## 2019-12-22 ENCOUNTER — Other Ambulatory Visit: Payer: Self-pay | Admitting: Family Medicine

## 2019-12-22 DIAGNOSIS — R000 Tachycardia, unspecified: Secondary | ICD-10-CM

## 2019-12-28 DIAGNOSIS — E291 Testicular hypofunction: Secondary | ICD-10-CM | POA: Diagnosis not present

## 2020-01-11 ENCOUNTER — Other Ambulatory Visit: Payer: Self-pay | Admitting: Family Medicine

## 2020-01-15 ENCOUNTER — Other Ambulatory Visit: Payer: Self-pay

## 2020-01-15 ENCOUNTER — Ambulatory Visit (INDEPENDENT_AMBULATORY_CARE_PROVIDER_SITE_OTHER)
Admission: RE | Admit: 2020-01-15 | Discharge: 2020-01-15 | Disposition: A | Discharge status: Home / Self Care | Payer: BC Managed Care – PPO | Source: Ambulatory Visit | Attending: Family Medicine | Admitting: Family Medicine

## 2020-01-15 DIAGNOSIS — F17210 Nicotine dependence, cigarettes, uncomplicated: Secondary | ICD-10-CM

## 2020-01-15 DIAGNOSIS — Z122 Encounter for screening for malignant neoplasm of respiratory organs: Secondary | ICD-10-CM

## 2020-01-18 NOTE — Progress Notes (Signed)
Please call patient and let them  know their  low dose Ct was read as a Lung RADS 2: nodules that are benign in appearance and behavior with a very low likelihood of becoming a clinically active cancer due to size or lack of growth. Recommendation per radiology is for a repeat LDCT in 12 months. .Please let them  know we will order and schedule their  annual screening scan for 12/2020. Please let them  know there was notation of CAD on their  scan.  Please remind the patient  that this is a non-gated exam therefore degree or severity of disease  cannot be determined. Please have them  follow up with their PCP regarding potential risk factor modification, dietary therapy or pharmacologic therapy if clinically indicated. Pt.  is not currently on statin therapy. Please place order for annual  screening scan for  12/2020 and fax results to PCP. Thanks so much. 

## 2020-01-20 ENCOUNTER — Ambulatory Visit (INDEPENDENT_AMBULATORY_CARE_PROVIDER_SITE_OTHER): Payer: BC Managed Care – PPO | Admitting: Family Medicine

## 2020-01-20 ENCOUNTER — Encounter: Payer: Self-pay | Admitting: Family Medicine

## 2020-01-20 ENCOUNTER — Other Ambulatory Visit: Payer: Self-pay

## 2020-01-20 VITALS — BP 134/78 | HR 61 | Temp 98.5°F | Ht 73.0 in | Wt 179.0 lb

## 2020-01-20 DIAGNOSIS — G2581 Restless legs syndrome: Secondary | ICD-10-CM

## 2020-01-20 DIAGNOSIS — N529 Male erectile dysfunction, unspecified: Secondary | ICD-10-CM

## 2020-01-20 DIAGNOSIS — J439 Emphysema, unspecified: Secondary | ICD-10-CM | POA: Diagnosis not present

## 2020-01-20 MED ORDER — ALBUTEROL SULFATE HFA 108 (90 BASE) MCG/ACT IN AERS: INHALATION_SPRAY | RESPIRATORY_TRACT | 2 refills | Status: DC

## 2020-01-20 MED ORDER — SILDENAFIL CITRATE 100 MG PO TABS: ORAL_TABLET | ORAL | 7 refills | Status: DC

## 2020-01-20 MED ORDER — GABAPENTIN 300 MG PO CAPS: ORAL_CAPSULE | ORAL | 3 refills | Status: DC

## 2020-01-20 NOTE — Progress Notes (Signed)
Chief Complaint  Patient presents with  . Follow-up    Subjective: Patient is a 64 y.o. male here for f/u.  RLS Pt w hx of this, on Requip 1 mg qhs. Getting worse. He did research that shows this medicine will eventually make s/s's worse. Has never been on gabapentin.   Emphysema +hx of emphysema. Follows pulm. CT lung showed this dx and he notices when he rides horses, he will be breathing out of his mouth. Also when he swims. No issue with daily activities or even going for longer walks. He does not cough or wheeze routinely. Not currently on any medication. He is a smoker.   Past Medical History:  Diagnosis Date  . Anal fistula    recurrent  . Benign localized prostatic hyperplasia with lower urinary tract symptoms (LUTS)   . Bilateral lower extremity edema    feet  . COPD with emphysema (HCC)   . Diverticulosis of colon   . ED (erectile dysfunction)   . Generalized anxiety disorder   . Hemorrhoids   . History of colon polyps   . History of diabetes mellitus, type II    10-10-2017  per pt no issues after wt loss surgery   . History of pneumothorax    1983 & 1988  right spontaneous, treated w/ chest tube  . Hyperlipidemia   . Hypertension   . MDD (major depressive disorder)   . OSA (obstructive sleep apnea)    10-10-2017  not used cpap since 2016 after wt loss surgery 2015  . Status post biliopancreatic diversion with duodenal switch    07-30-2013   @ [facility] for wt. loss surgery  . Vitamin D deficiency   . Wears glasses   . Zinc deficiency 04/27/2016    Objective: BP 134/78 (BP Location: Left Arm, Patient Position: Sitting, Cuff Size: Normal)   Pulse 61   Temp 98.5 F (36.9 C) (Oral)   Ht 6\' 1"  (1.854 m)   Wt 179 lb (81.2 kg)   SpO2 98%   BMI 23.62 kg/m  General: Awake, appears stated age Heart: RRR Lungs: CTAB, no rales, wheezes or rhonchi. No accessory muscle use Psych: Age appropriate judgment and insight, normal affect and mood  Assessment and Plan: RLS  (restless legs syndrome) - Plan: gabapentin (NEURONTIN) 300 MG capsule  Pulmonary emphysema, unspecified emphysema type (HCC) - Plan: albuterol (VENTOLIN HFA) 108 (90 Base) MCG/ACT inhaler  Erectile dysfunction, unspecified erectile dysfunction type - Plan: sildenafil (VIAGRA) 100 MG tablet  1. Stop Requip. Start gabapentin 300 mg after lunch and in evening. May just need evening dose after a while. 2. Start SABA 10-15 min prior to exercise.  I will see him in 1 mo to reck above.  The patient voiced understanding and agreement to the plan.  [place], DO 01/20/20  11:18 AM

## 2020-01-20 NOTE — Patient Instructions (Signed)
Stop the Requip.  Take the inhaler 10-15 minutes prior to exercise.  Let me know if there are cost issues.  Let us know if you need anything.

## 2020-01-22 ENCOUNTER — Other Ambulatory Visit: Payer: Self-pay | Admitting: Family Medicine

## 2020-01-22 NOTE — Telephone Encounter (Signed)
Last OV---01/20/2020 Last RF--07/14/2019  #90 with 1 refill CSC/UDS 02/28/2018

## 2020-02-02 ENCOUNTER — Telehealth: Payer: Self-pay | Admitting: Family Medicine

## 2020-02-02 DIAGNOSIS — L0291 Cutaneous abscess, unspecified: Secondary | ICD-10-CM

## 2020-02-04 NOTE — Telephone Encounter (Signed)
Pt came in office stating that called the pharmacy to get his refill on Cephalexin 500 MG TAKE ONE CAPSULE BY MOUTH THREE TIMES A DAY and that pharmacy mentioned that is was denied, pt was very upset stating that is needing his refill and wanted to know why rx was denied. Please advise pt ASAP at [phone].

## 2020-02-08 NOTE — Telephone Encounter (Signed)
Patient states he has an abscess on his buttock and it is recurring . Patient states this amitotic works better than any other medication he has tried.

## 2020-02-09 ENCOUNTER — Encounter: Payer: Self-pay | Admitting: *Deleted

## 2020-02-09 ENCOUNTER — Other Ambulatory Visit: Payer: Self-pay | Admitting: *Deleted

## 2020-02-09 DIAGNOSIS — F17210 Nicotine dependence, cigarettes, uncomplicated: Secondary | ICD-10-CM

## 2020-02-10 MED ORDER — CEPHALEXIN 500 MG PO CAPS: 500.0000 mg | ORAL_CAPSULE | Freq: Three times a day (TID) | ORAL | 0 refills | Status: AC

## 2020-02-10 NOTE — Telephone Encounter (Signed)
Patient informed prescription sent in. ALSO informed of PCP instructions per Teams message and the patient verbalized understanding. [1:57 PM] Arva Chafe let him know i want to see him next time or defer to derm

## 2020-02-10 NOTE — Telephone Encounter (Signed)
Patient would like to know what is the hold on the medication. He states he had called several times and came in the office once. He would like a phone call back asap at [phone]

## 2020-02-10 NOTE — Telephone Encounter (Signed)
Called the patient and he has gotten  doxycyline from his derm., but never worked that well. But the antibiotic PCP gave him worked much better for abscess's on buttock so would like a refill on that one

## 2020-02-10 NOTE — Addendum Note (Signed)
Addended by: Radene Gunning on: 02/10/2020 12:24 PM   Modules accepted: Orders

## 2020-02-18 DIAGNOSIS — F330 Major depressive disorder, recurrent, mild: Secondary | ICD-10-CM | POA: Diagnosis not present

## 2020-02-18 DIAGNOSIS — F411 Generalized anxiety disorder: Secondary | ICD-10-CM | POA: Diagnosis not present

## 2020-03-01 ENCOUNTER — Other Ambulatory Visit: Payer: Self-pay | Admitting: Family Medicine

## 2020-03-01 DIAGNOSIS — L649 Androgenic alopecia, unspecified: Secondary | ICD-10-CM

## 2020-03-08 DIAGNOSIS — L281 Prurigo nodularis: Secondary | ICD-10-CM | POA: Diagnosis not present

## 2020-03-08 DIAGNOSIS — L981 Factitial dermatitis: Secondary | ICD-10-CM | POA: Diagnosis not present

## 2020-03-23 MED ORDER — TAMSULOSIN HCL 0.4 MG PO CAPS: 0.4000 mg | ORAL_CAPSULE | Freq: Every day | ORAL | 1 refills | Status: DC

## 2020-03-24 ENCOUNTER — Other Ambulatory Visit: Payer: Self-pay

## 2020-03-25 ENCOUNTER — Other Ambulatory Visit: Payer: Self-pay | Admitting: Family Medicine

## 2020-03-25 ENCOUNTER — Other Ambulatory Visit: Payer: Self-pay

## 2020-03-25 ENCOUNTER — Ambulatory Visit: Payer: BC Managed Care – PPO | Admitting: Family Medicine

## 2020-03-25 ENCOUNTER — Encounter: Payer: Self-pay | Admitting: Family Medicine

## 2020-03-25 VITALS — BP 128/82 | HR 60 | Temp 98.0°F | Ht 73.0 in | Wt 180.1 lb

## 2020-03-25 DIAGNOSIS — R202 Paresthesia of skin: Secondary | ICD-10-CM

## 2020-03-25 DIAGNOSIS — G2581 Restless legs syndrome: Secondary | ICD-10-CM | POA: Diagnosis not present

## 2020-03-25 DIAGNOSIS — R2689 Other abnormalities of gait and mobility: Secondary | ICD-10-CM | POA: Diagnosis not present

## 2020-03-25 LAB — COMPREHENSIVE METABOLIC PANEL
ALT: 39 U/L (ref 0–53)
AST: 40 U/L — ABNORMAL HIGH (ref 0–37)
Albumin: 3.6 g/dL (ref 3.5–5.2)
Alkaline Phosphatase: 113 U/L (ref 39–117)
BUN: 11 mg/dL (ref 6–23)
CO2: 32 mEq/L (ref 19–32)
Calcium: 8.2 mg/dL — ABNORMAL LOW (ref 8.4–10.5)
Chloride: 108 mEq/L (ref 96–112)
Creatinine, Ser: 0.81 mg/dL (ref 0.40–1.50)
GFR: 93.56 mL/min (ref >60.00)
Glucose, Bld: 96 mg/dL (ref 70–99)
Potassium: 4 mEq/L (ref 3.5–5.1)
Sodium: 142 mEq/L (ref 135–145)
Total Bilirubin: 0.4 mg/dL (ref 0.2–1.2)
Total Protein: 5.5 g/dL — ABNORMAL LOW (ref 6.0–8.3)

## 2020-03-25 LAB — CBC
HCT: 38.2 % — ABNORMAL LOW (ref 39.0–52.0)
Hemoglobin: 12.8 g/dL — ABNORMAL LOW (ref 13.0–17.0)
MCHC: 33.5 g/dL (ref 30.0–36.0)
MCV: 90.1 fl (ref 78.0–100.0)
Platelets: 150 10*3/uL (ref 150.0–400.0)
RBC: 4.24 Mil/uL (ref 4.22–5.81)
RDW: 15 % (ref 11.5–15.5)
WBC: 3.7 10*3/uL — ABNORMAL LOW (ref 4.0–10.5)

## 2020-03-25 LAB — B12 AND FOLATE PANEL
Folate: 5.9 ng/mL — ABNORMAL LOW (ref >5.9)
Vitamin B-12: 492 pg/mL (ref 211–911)

## 2020-03-25 LAB — IBC + FERRITIN
Ferritin: 7.5 ng/mL — ABNORMAL LOW (ref 22.0–322.0)
Iron: 36 ug/dL — ABNORMAL LOW (ref 42–165)
Saturation Ratios: 7.5 % — ABNORMAL LOW (ref 20.0–50.0)
Transferrin: 345 mg/dL (ref 212.0–360.0)

## 2020-03-25 LAB — MAGNESIUM: Magnesium: 1.8 mg/dL (ref 1.5–2.5)

## 2020-03-25 MED ORDER — PREGABALIN 75 MG PO CAPS: 75.0000 mg | ORAL_CAPSULE | Freq: Every evening | ORAL | 2 refills | Status: DC | PRN

## 2020-03-25 MED ORDER — ROPINIROLE HCL 1 MG PO TABS: 2.0000 mg | ORAL_TABLET | Freq: Every day | ORAL | 3 refills | Status: DC

## 2020-03-25 NOTE — Patient Instructions (Signed)
Give us 2-3 business days to get the results of your labs back.  ° °Stay hydrated. ° °If you do not hear anything about your referral in the next 1-2 weeks, call our office and ask for an update. ° °Let us know if you need anything. °

## 2020-03-25 NOTE — Progress Notes (Signed)
Chief Complaint  Patient presents with  . Leg Pain    Subjective: Patient is a 65 y.o. male here for f/u.  RLS Pt w hx of RLS. He was on Requip and was changed to gabapentin. Things are worsening. He gets it when he rests or is still. They get better with movement or wt bearing. He takes Fe daily.   Tingling in in hands, more freq over recent weeks. Used to be q 6 mo. No changes in eating, drinking, injuries. Lasts around 10-20 min. This is not positional. He is started to drop things. Both arms are affected equally. He has associated balance issues over the past 6 months. He is stumbling. No weakness in the legs. No difficulty swallowing or with speech.   Past Medical History:  Diagnosis Date  . Anal fistula    recurrent  . Benign localized prostatic hyperplasia with lower urinary tract symptoms (LUTS)   . Bilateral lower extremity edema    feet  . COPD with emphysema (HCC)   . Diverticulosis of colon   . ED (erectile dysfunction)   . Generalized anxiety disorder   . Hemorrhoids   . History of colon polyps   . History of diabetes mellitus, type II    10-10-2017  per pt no issues after wt loss surgery   . History of pneumothorax    1983 & 1988  right spontaneous, treated w/ chest tube  . Hyperlipidemia   . Hypertension   . MDD (major depressive disorder)   . OSA (obstructive sleep apnea)    10-10-2017  not used cpap since 2016 after wt loss surgery 2015  . Status post biliopancreatic diversion with duodenal switch    07-30-2013   @ [facility] for wt. loss surgery  . Vitamin D deficiency   . Wears glasses   . Zinc deficiency 04/27/2016    Objective: BP 128/82 (BP Location: Left Arm, Patient Position: Sitting, Cuff Size: Normal)   Pulse 60   Temp 98 F (36.7 C) (Oral)   Ht 6\' 1"  (1.854 m)   Wt 180 lb 2 oz (81.7 kg)   SpO2 97%   BMI 23.76 kg/m  General: Awake, appears stated age HEENT: MMM, EOMi, PERRLA, uvula rose equally upon phonation, no fasisculation Heart: RRR Neuro:  DTR's equal and symmetric throughout. Tinel's neg at cub tunnel and carpal tunnel, gait WNL, 5/5 strength throughout MSK: No atrophy or asymmetry Lungs: CTAB, no rales, wheezes or rhonchi. No accessory muscle use Psych: Age appropriate judgment and insight, normal affect and mood  Assessment and Plan: RLS (restless legs syndrome) - Plan: IBC + Ferritin, pregabalin (LYRICA) 75 MG capsule  Paresthesias - Plan: B12 and Folate Panel, Vitamin B1, Zinc, Ambulatory referral to Neurology, Magnesium, CBC, Comprehensive metabolic panel  Balance problem - Plan: Ambulatory referral to Neurology  1. Trial Lyrica. Stop Requip and gabapentin. Ck Fe today. If WNL, will have him f/u in about 1 mo and consider Sinemet.  2. Ck labs. He has a hx of gastric bypass. Refer to neuro, defer imaging to them.  3. As noted in #2. No immediate fall risk.  The patient voiced understanding and agreement to the plan.  [place], DO 03/25/20  9:29 AM

## 2020-03-27 LAB — ZINC: Zinc: 46 ug/dL — ABNORMAL LOW (ref 60–130)

## 2020-03-30 LAB — VITAMIN B1: Vitamin B1 (Thiamine): 11 nmol/L (ref 8–30)

## 2020-05-06 ENCOUNTER — Other Ambulatory Visit: Payer: Self-pay | Admitting: Family Medicine

## 2020-05-06 NOTE — Telephone Encounter (Signed)
Requesting: Ambien 10mg  Contract: None UDS: None Last Visit: 03/25/2020 Next Visit: None Last Refill: 08/24/2019 #30 and 5RF  Please Advise

## 2020-05-31 ENCOUNTER — Ambulatory Visit: Payer: BC Managed Care – PPO | Admitting: Neurology

## 2020-05-31 ENCOUNTER — Encounter: Payer: Self-pay | Admitting: Neurology

## 2020-05-31 ENCOUNTER — Other Ambulatory Visit: Payer: Self-pay

## 2020-05-31 VITALS — BP 168/92 | HR 64 | Ht 73.0 in | Wt 187.0 lb

## 2020-05-31 DIAGNOSIS — R202 Paresthesia of skin: Secondary | ICD-10-CM

## 2020-05-31 DIAGNOSIS — G2581 Restless legs syndrome: Secondary | ICD-10-CM | POA: Diagnosis not present

## 2020-05-31 DIAGNOSIS — R2689 Other abnormalities of gait and mobility: Secondary | ICD-10-CM | POA: Diagnosis not present

## 2020-05-31 DIAGNOSIS — W19XXXA Unspecified fall, initial encounter: Secondary | ICD-10-CM

## 2020-05-31 DIAGNOSIS — R270 Ataxia, unspecified: Secondary | ICD-10-CM

## 2020-05-31 NOTE — Progress Notes (Addendum)
[facility]    Provider:  Dr Lucia Gaskins Requesting Provider: Sharlene Dory* Primary Care Provider:  Sharlene Dory, DO  CC: Imbalance, numbness and tingling  HPI:  Daniel [name] is a 65 y.o. male here as requested by Sharlene Dory* for paresthesias and balance.  He has a past medical history of gastric bypass, peripheral edema, tobacco abuse, bloating, hyperlipidemia, vitamin deficiencies, anxiety, restless leg syndrome, obstructive sleep apnea, depression, hypertension, diabetes type 2, erectile dysfunction, COPD with emphysema.  . Numbness and tingling started worsening in the last 6 months, becoming more often and more frequent, at one point he was having symptoms every day lasting hours, it would come and go, he had a week where it was terrible, now he has the symptoms happen once a week for 10 minutes. He reports tingling into the finger tips and he feels it radiating down his arm, the tips of the fingers, unknown if digit 5 is involved or not, Happens in front of the TV or sitting at his desk at work. No nocturnal awakenings and not worse in the morning. Not painful. No weakness that he notices. No associated neck pain or shooting pain/radicular symptoms, no symptoms in the legs, rarely in the left hand. Patient is here alone, he has restless legs and takes medication for, he has been having numbness and tingling down his arms to the fingers, 90% of the time is the right arm but is happened in the left, has been going on the last 6 months, he has had some problems with his balance, this started about 6 to 12 months ago, no falls in the past year but has had to touch the wall for support after he gets out of a chair. Balance when he gets up out of a chair, no dizziness or lightheadedness, like his feet are not working, if he gets up slowly it doesn't happen, only when he gets up fast. No falls. No tremors. No other focal neurologic deficits, associated  symptoms, inciting events or modifiable factors.  Reviewed notes, labs and imaging from outside physicians, which showed:  CT head 2013: reviewed images and agree.   Clinical Data: Headache and unsteadiness   CT HEAD WITHOUT CONTRAST   Technique: Contiguous axial images were obtained from the base of  the skull through the vertex without contrast.   Comparison: None.   Findings: Frontal lobe atrophy bilaterally. Ventricle size is  normal. Negative for intracranial hemorrhage or mass. No acute or  chronic infarct identified. Calvarium is intact.   IMPRESSION:  Frontal lobe atrophy. No acute abnormality.   B12 492, folate 5.9 (decreased), B1 11 Review of Systems: Patient complains of symptoms per HPI as well as the following symptoms: rls. Pertinent negatives and positives per HPI. All others negative.   Social History   Socioeconomic History  . Marital status: Single    Spouse name: Not on file  . Number of children: 0  . Years of education: Not on file  . Highest education level: Not on file  Occupational History  . Occupation: Airline pilot  Tobacco Use  . Smoking status: Current Every Day Smoker    Packs/day: 0.50    Years: 50.00    Pack years: 25.00    Types: Cigarettes  . Smokeless tobacco: Never Used  Vaping Use  . Vaping Use: Never used  Substance and Sexual Activity  . Alcohol use: Yes    Alcohol/week: 0.0 standard drinks    Comment: rare  . Drug  use: No  . Sexual activity: Not on file  Other Topics Concern  . Not on file  Social History Narrative   Regular exercise-no   Right handed   Lives alone   Caffeine: 2 cups/day   Social Determinants of Health   Financial Resource Strain: Not on file  Food Insecurity: Not on file  Transportation Needs: Not on file  Physical Activity: Not on file  Stress: Not on file  Social Connections: Not on file  Intimate Partner Violence: Not on file    Family History  Problem Relation Age of Onset  .  Cancer Mother        ovarian  . Hyperlipidemia Father   . Hypertension Father   . Heart disease Father        MI 7/19  . Emphysema Father   . Pulmonary disease Father   . Heart disease Maternal Grandmother   . Heart disease Maternal Grandfather   . Stroke Maternal Grandfather   . Heart disease Paternal Grandmother   . Angina Paternal Grandmother   . Heart disease Paternal Grandfather   . Stroke Paternal Grandfather   . Diabetes Maternal Uncle   . Kidney disease Maternal Uncle   . Diabetes Paternal Uncle   . Cancer Paternal Uncle   . Neuropathy Neg Hx     Past Medical History:  Diagnosis Date  . Anal fistula    recurrent  . Benign localized prostatic hyperplasia with lower urinary tract symptoms (LUTS)   . Bilateral lower extremity edema    feet  . COPD with emphysema (HCC)   . Diverticulosis of colon   . ED (erectile dysfunction)   . Generalized anxiety disorder   . Hemorrhoids   . History of colon polyps   . History of diabetes mellitus, type II    10-10-2017  per pt no issues after wt loss surgery   . History of pneumothorax    1983 & 1988  right spontaneous, treated w/ chest tube  . Hyperlipidemia   . Hypertension   . MDD (major depressive disorder)   . OSA (obstructive sleep apnea)    10-10-2017  not used cpap since 2016 after wt loss surgery 2015  . Status post biliopancreatic diversion with duodenal switch    07-30-2013   @ [facility] for wt. loss surgery  . Vitamin D deficiency   . Wears glasses   . Zinc deficiency 04/27/2016    Patient Active Problem List   Diagnosis Date Noted  . Gastroesophageal reflux disease 06/22/2020  . Iron deficiency anemia 06/08/2020  . Pulmonary emphysema (HCC) 01/20/2020  . RLS (restless legs syndrome) 06/15/2019  . Anxiety 10/17/2018  . Changing skin lesion 12/24/2017  . Dehydration 10/30/2016  . Dysfunction of both eustachian tubes 10/30/2016  . Tongue burning sensation 09/18/2016  . Left lower quadrant pain 06/29/2016  .  Bloating 06/29/2016  . Rectal bleeding 06/29/2016  . Zinc deficiency 04/27/2016  . H/O gastric bypass 04/27/2016  . 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 Diarrhea 12/19/2015  . Hemorrhoids 05/22/2015  . Osteoarthritis 01/13/2013  . Unsteadiness 02/27/2012  . BPH (benign prostatic hyperplasia) 08/22/2011  . Diabetes mellitus type 2 in nonobese (HCC) 05/21/2011  . Vitamin D deficiency 05/21/2011  . Constipation 01/12/2011  . Scrotal cyst 12/18/2010  . Annual physical exam 09/17/2010  . Shoulder pain, left 07/16/2010  . Low back pain 07/16/2010  . Insomnia 03/16/2010  . OBSTRUCTIVE SLEEP APNEA 02/20/2010  . Tobacco abuse 01/26/2010  .  PERIPHERAL EDEMA 01/26/2010  . SNORING 01/26/2010  . Hypotestosteronemia 03/24/2009  . Hyperlipidemia 03/24/2009  . ANXIETY DEPRESSION 03/24/2009  . Essential hypertension 03/24/2009    Past Surgical History:  Procedure Laterality Date  . ANAL FISTULOTOMY N/A 08/07/2016   Procedure: ANAL FISTULOTOMY;  Surgeon: Abigail Miyamoto, MD;  Location: [facility] OR;  Service: General;  Laterality: N/A;  . CHEST TUBE INSERTION  1988   right lung for spontaneous pneumothorax  . COLONOSCOPY W/ BIOPSIES  08/02/2016  . EXOSTECTECTOMY TOE Left 04/25/2017   Procedure: TARSAL EXOSTECTOMY OF MEDIAL CUNEIFORM AND EXOSTECTOMY OF THE FIRST METATARSAL;  Surgeon: Felecia Shelling, DPM;  Location: [facility] OR;  Service: Podiatry;  Laterality: Left;  [place] Kitchen GASTROPLASTY DUODENAL SWITCH  07-30-2013       "wt loss surgery not gastric bypass surgery" (biliopancreatic diversion w/ duodenal switch)  . HEMORRHOID SURGERY N/A 08/07/2016   Procedure: HEMORRHOIDECTOMY;  Surgeon: Abigail Miyamoto, MD;  Location: [facility] OR;  Service: General;  Laterality: N/A;  . INCISION AND DRAINAGE ABSCESS N/A 10/14/2017   Procedure: POSSIBLE INCISION AND DRAINAGE OF ANAL ABSCESS;  Surgeon: Andria Meuse, MD;  Location: [facility];  Service: General;   Laterality: N/A;  . INGUINAL HERNIA REPAIR Left 04/10/2017   Procedure: LAPAROSCOPIC LEFT INGUINAL HERNIA REPAIR WITH MESH;  Surgeon: Abigail Miyamoto, MD;  Location: [facility];  Service: General;  Laterality: Left;  . INSERTION OF MESH Left 04/10/2017   Procedure: INSERTION OF MESH;  Surgeon: Abigail Miyamoto, MD;  Location: [facility];  Service: General;  Laterality: Left;  . IRRIGATION AND DEBRIDEMENT SEBACEOUS CYST     back  . MASS EXCISION N/A 01/29/2018   Procedure: EXCISION of lesion on scalp;  Surgeon: Peggye Form, DO;  Location: [facility];  Service: Plastics;  Laterality: N/A;  . RECTAL EXAM UNDER ANESTHESIA N/A 10/14/2017   Procedure: ANAL EXAM UNDER ANESTHESIA, INJECTION OF FIBRIN GLUE;  Surgeon: Andria Meuse, MD;  Location: [facility];  Service: General;  Laterality: N/A;    Current Outpatient Medications  Medication Sig Dispense Refill  . albuterol (VENTOLIN HFA) 108 (90 Base) MCG/ACT inhaler Take 2 puffs 10-15 minutes prior to exercise. 18 g 2  . amphetamine-dextroamphetamine (ADDERALL) 20 MG tablet Take 1 tablet (20 mg total) by mouth 2 (two) times daily. 60 tablet 0  . buPROPion (WELLBUTRIN XL) 150 MG 24 hr tablet TAKE ONE TABLET BY MOUTH THREE TIMES A DAY 270 tablet 1  . BYSTOLIC 10 MG tablet TAKE ONE TABLET BY MOUTH TWICE A DAY 60 tablet 5  . Calcium Carb-Cholecalciferol 600-500 MG-UNIT CAPS Take 1 tablet by mouth daily.    . Cholecalciferol (VITAMIN D3) 1000 units CAPS Take 1,000 Units by mouth daily.    . clobetasol (TEMOVATE) 0.05 % external solution APPLY TO AFFECTED AREA(S) DAILY AS NEEDED FOR ITCHING, USE TO SCALP AS DIRECTED 50 mL 1  . diazepam (VALIUM) 5 MG tablet TAKE ONE TO TWO TABLETS BY MOUTH EVERY 8 HOURS AS NEEDED FOR ANXIETY 90 tablet 2  . DULoxetine (CYMBALTA) 30 MG capsule TAKE 3 CAPSULES BY MOUTH EVERY DAY 270 capsule 2  . finasteride (PROPECIA) 1 MG tablet TAKE ONE TABLET BY  MOUTH DAILY 30 tablet 1  . lisinopril (ZESTRIL) 2.5 MG tablet Take 1 tablet (2.5 mg total) by mouth daily. 90 tablet 3  . metoprolol succinate (TOPROL-XL) 50 MG 24 hr tablet TAKE ONE TABLET BY MOUTH DAILY. TAKE WITH OR IMMEDIATELY FOLLOWING A MEAL 90  tablet 1  . Multiple Vitamins-Minerals (CENTRUM SILVER) tablet Take 2 tablets by mouth daily.    . Omega-3 300 MG CAPS Take 300 mg by mouth daily.    . pregabalin (LYRICA) 75 MG capsule Take 1 capsule (75 mg total) by mouth at bedtime as needed (RLS). 30 capsule 2  . rOPINIRole (REQUIP) 1 MG tablet Take 2 tablets (2 mg total) by mouth at bedtime. 60 tablet 3  . sildenafil (VIAGRA) 100 MG tablet TAKE ONE TABLET BY MOUTH DAILY AS NEEDED FOR FOR ERECTILE DYSFUNCTION 30 tablet 7  . tamsulosin (FLOMAX) 0.4 MG CAPS capsule Take 1 capsule (0.4 mg total) by mouth daily after supper. 90 capsule 1  . testosterone cypionate (DEPO-TESTOSTERONE) 200 MG/ML injection Inject 1 mL (200 mg total) into the muscle every 14 (fourteen) days. 10 mL 0  . VITAMIN A PO Take 2,400 Units by mouth daily.    . Vitamin D, Ergocalciferol, (DRISDOL) 1.25 MG (50000 UNIT) CAPS capsule TAKE ONE CAPSULE BY MOUTH EVERY 7 DAYS 12 capsule 1  . vitamin E 200 UNIT capsule Take 200 Units by mouth daily.    [place] Kitchen. zinc gluconate 50 MG tablet Take 100 mg by mouth daily.    Melene Muller. [START ON 08/03/2020] nicotine (NICODERM CQ - DOSED IN MG/24 HOURS) 14 mg/24hr patch Place 1 patch (14 mg total) onto the skin daily for 14 days. 14 patch 0  . nicotine (NICODERM CQ - DOSED IN MG/24 HOURS) 21 mg/24hr patch Place 1 patch (21 mg total) onto the skin daily. 42 patch 0  . [START ON 08/17/2020] nicotine (NICODERM CQ - DOSED IN MG/24 HR) 7 mg/24hr patch Place 1 patch (7 mg total) onto the skin daily. 14 patch 0  . pantoprazole (PROTONIX) 40 MG tablet Take 1 tablet (40 mg total) by mouth daily. 30 tablet 1  . temazepam (RESTORIL) 15 MG capsule Take 1 capsule (15 mg total) by mouth at bedtime as needed for sleep. 30 capsule  0  . varenicline (CHANTIX PAK) 0.5 MG X 11 & 1 MG X 42 tablet Take 0.5 mg tab by mouth daily for 3 days, then increase to 0.5 mg tab twice daily for 4 days, then increase to one 1 mg tab twice daily. 53 tablet 0  . Zinc 50 MG CAPS Take 1 capsule by mouth daily.     No current facility-administered medications for this visit.    Allergies as of 05/31/2020  . (No Known Allergies)    Vitals: BP (!) 168/92 (BP Location: Right Arm, Patient Position: Sitting)   Pulse 64   Ht 6\' 1"  (1.854 m)   Wt 187 lb (84.8 kg)   SpO2 97%   BMI 24.67 kg/m  Last Weight:  Wt Readings from Last 1 Encounters:  06/22/20 187 lb (84.8 kg)   Last Height:   Ht Readings from Last 1 Encounters:  06/22/20 6\' 1"  (1.854 m)     Physical exam: Exam: Gen: NAD, conversant, well nourised, well groomed                     CV: RRR, no MRG. No Carotid Bruits. No peripheral edema, warm, nontender Eyes: Conjunctivae clear without exudates or hemorrhage  Neuro: Detailed Neurologic Exam  Speech:    Speech is normal; fluent and spontaneous with normal comprehension.  Cognition:    The patient is oriented to person, place, and time;     recent and remote memory intact;     language fluent;  normal attention, concentration,     fund of knowledge Cranial Nerves:    The pupils are equal, round, and reactive to light. The fundi are flat. Visual fields are full to finger confrontation. Extraocular movements are intact. Trigeminal sensation is intact and the muscles of mastication are normal. The face is symmetric. The palate elevates in the midline. Hearing intact. Voice is normal. Shoulder shrug is normal. The tongue has normal motion without fasciculations.   Coordination:    No dysmetria or ataxia  Gait:    Heel-toe and tandem gait are normal.   Motor Observation:    No asymmetry, no atrophy, and no involuntary movements noted. Tone:    Normal muscle tone.    Posture:    Posture is normal. normal erect     Strength:    Strength is V/V in the upper and lower limbs.      Sensation: intact to LT     Reflex Exam:  DTR's:    Absent AJs otherwise seep tendon reflexes in the upper and lower extremities are 1+-2+ bilaterally.   Toes:    The toes are equiv left, down right.   Clonus:    Clonus is absent.    Assessment/Plan:   65 y.o. male here as requested by Sharlene Dory* for paresthesias and balance.  He has a past medical history of gastric bypass, peripheral edema, tobacco abuse, bloating, hyperlipidemia, vitamin deficiencies, anxiety, restless leg syndrome, obstructive sleep apnea, depression, hypertension, diabetes type 2, erectile dysfunction, COPD with emphysema.  Patient at this time wants to try conservative measures, monitor clinically, declined any testing  - RLS: Ferritin: 7.5, may be contributing to RLS f/u with pcp may consider iron infusions, continue supplementation, make sure taking daily iron pills and take with vitamin C to help with absorption. Goal is > 75 ferritin - Sensory symptoms in the hands: Consider EMG/NCS (carpal tunnel syndrome vs radic?) and MRI cervical spine - Imbalance: Consider MRI cervical spine to evaluate for cervical stenosis. Discussed fall prevention.  - Follow vitamin B levels and other vitamins (gastric bypass) with pcp.   - return to pcp, f/u as needed  Orders Placed This Encounter  Procedures  . MR CERVICAL SPINE WO CONTRAST   No orders of the defined types were placed in this encounter.   Cc: Sharlene Dory*,  Sharlene Dory, DO  Naomie Dean, MD  [facility] [address] [place], [place] [address]  Phone [phone] Fax [phone]

## 2020-05-31 NOTE — Patient Instructions (Addendum)
RLS: Recent studies have shown that decreased iron stores (indicated by serum ferritin levels below 50 ng per mL [50 ?g per L] can exacerbate RLS symptoms. 11,12 Patients with newly diagnosed RLS or RLS patients with a recent exacerbation of symptoms should have their serum ferritin levels measured. (yours is 7.5). Continue supplementation.  Sensory symptoms in the hands: Consider EMG/NCS (carpal tunnel syndrome?) and MRI cervical spine Imbalance: Consider MRI cervical spine   Electromyoneurogram Electromyoneurogram is a test to check how well your muscles and nerves are working. This procedure includes the combined use of electromyogram (EMG) and nerve conduction study (NCS). EMG is used to look for muscular disorders. NCS, which is also called electroneurogram, measures how well your nerves are controlling your muscles. The procedures are usually done together to check if your muscles and nerves are healthy. If the results of the tests are abnormal, this may indicate disease or injury, such as a neuromuscular disease or peripheral nerve damage. Tell a health care provider about:  Any allergies you have.  All medicines you are taking, including vitamins, herbs, eye drops, creams, and over-the-counter medicines.  Any problems you or family members have had with anesthetic medicines.  Any blood disorders you have.  Any surgeries you have had.  Any medical conditions you have.  If you have a pacemaker.  Whether you are pregnant or may be pregnant. What are the risks? Generally, this is a safe procedure. However, problems may occur, including:  Infection where the electrodes were inserted.  Bleeding. What happens before the procedure? Medicines Ask your health care provider about:  Changing or stopping your regular medicines. This is especially important if you are taking diabetes medicines or blood thinners.  Taking medicines such as aspirin and ibuprofen. These medicines can thin  your blood. Do not take these medicines unless your health care provider tells you to take them.  Taking over-the-counter medicines, vitamins, herbs, and supplements. General instructions  Your health care provider may ask you to avoid: ? Beverages that have caffeine, such as coffee and tea. ? Any products that contain nicotine or tobacco. These products include cigarettes, e-cigarettes, and chewing tobacco. If you need help quitting, ask your health care provider.  Do not use lotions or creams on the same day that you will be having the procedure. What happens during the procedure? For EMG  Your health care provider will ask you to stay in a position so that he or she can access the muscle that will be studied. You may be standing, sitting, or lying down.  You may be given a medicine that numbs the area (local anesthetic).  A very thin needle that has an electrode will be inserted into your muscle.  Another small electrode will be placed on your skin near the muscle.  Your health care provider will ask you to continue to remain still.  The electrodes will send a signal that tells about the electrical activity of your muscles. You may see this on a monitor or hear it in the room.  After your muscles have been studied at rest, your health care provider will ask you to contract or flex your muscles. The electrodes will send a signal that tells about the electrical activity of your muscles.  Your health care provider will remove the electrodes and the electrode needles when the procedure is finished. The procedure may vary among health care providers and hospitals.   For NCS  An electrode that records your nerve activity (recording electrode)  will be placed on your skin by the muscle that is being studied.  An electrode that is used as a reference (reference electrode) will be placed near the recording electrode.  A paste or gel will be applied to your skin between the recording  electrode and the reference electrode.  Your nerve will be stimulated with a mild shock. Your health care provider will measure how much time it takes for your muscle to react.  Your health care provider will remove the electrodes and the gel when the procedure is finished. The procedure may vary among health care providers and hospitals.   What happens after the procedure?  It is up to you to get the results of your procedure. Ask your health care provider, or the department that is doing the procedure, when your results will be ready.  Your health care provider may: ? Give you medicines for any pain. ? Monitor the insertion sites to make sure that bleeding stops. Summary  Electromyoneurogram is a test to check how well your muscles and nerves are working.  If the results of the tests are abnormal, this may indicate disease or injury.  This is a safe procedure. However, problems may occur, such as bleeding and infection.  Your health care provider will do two tests to complete this procedure. One checks your muscles (EMG) and another checks your nerves (NCS).  It is up to you to get the results of your procedure. Ask your health care provider, or the department that is doing the procedure, when your results will be ready. This information is not intended to replace advice given to you by your health care provider. Make sure you discuss any questions you have with your health care provider. Document Revised: 11/19/2017 Document Reviewed: 11/01/2017 Elsevier Patient Education  2021 [employer].

## 2020-06-01 ENCOUNTER — Telehealth: Payer: Self-pay | Admitting: Family Medicine

## 2020-06-01 NOTE — Telephone Encounter (Signed)
Pt's neurologist reached out to [place] regarding low iron. If he is not able to tolerate low iron, have him notify us and we can set him up with the hematology team to discuss iron infusions. Ty.

## 2020-06-02 ENCOUNTER — Other Ambulatory Visit: Payer: Self-pay

## 2020-06-02 DIAGNOSIS — D509 Iron deficiency anemia, unspecified: Secondary | ICD-10-CM

## 2020-06-02 NOTE — Telephone Encounter (Signed)
Pt says he thinks he is ready to have the infusion team reach out since he has been taking iron PO for about 6 months now.

## 2020-06-02 NOTE — Telephone Encounter (Signed)
Referral placed.

## 2020-06-02 NOTE — Telephone Encounter (Signed)
Please place referral to hematology, dx iron deficiency. Ty.

## 2020-06-06 ENCOUNTER — Telehealth: Payer: Self-pay | Admitting: Hematology and Oncology

## 2020-06-06 NOTE — Telephone Encounter (Signed)
Received a new hem referral from Dr. Carmelia Roller for IDA. Daniel [name] has been cld and scheduled to see Dr. Pamelia Hoit on 3/23 at 1:15pm Pt aware toa rrive 20 minuets early.

## 2020-06-07 NOTE — Progress Notes (Signed)
[facility] CONSULT NOTE  Patient Care Team: Sharlene Dory, DO as PCP - General (Family Medicine) Portenier, Annabelle Harman, MD as Referring Physician (Surgery) Nita Sells, MD as Consulting Physician (Dermatology)  CHIEF COMPLAINTS/PURPOSE OF CONSULTATION:  Newly diagnosed iron deficiency anemia  HISTORY OF PRESENTING ILLNESS:  Daniel [name] 65 y.o. male is here because of recent diagnosis of iron deficiency anemia. He is referred by Dr. Carmelia Roller. Labs on 03/25/20 showed Hg 12.8, Hct 38.2, platelets 150, iron saturation 7.5%, ferritin 7.5. He presents to the clinic today for initial evaluation.  He was experiencing problems with restless legs and seeing neurology.  Work-up done by neurology revealed profound iron deficiency with a ferritin of 7.5.  Last year he had lab work showing iron deficiency and was informed by his physicians to take over-the-counter iron supplements.  He was taking 1 iron tablet a day because any more he was getting symptoms of constipation and gastritis.  Because of persistent iron deficiency, he was referred to [place] for consideration for IV iron treatment.  I reviewed his records extensively and collaborated the history with the patient.  MEDICAL HISTORY:  Past Medical History:  Diagnosis Date  . Anal fistula    recurrent  . Benign localized prostatic hyperplasia with lower urinary tract symptoms (LUTS)   . Bilateral lower extremity edema    feet  . COPD with emphysema (HCC)   . Diverticulosis of colon   . ED (erectile dysfunction)   . Generalized anxiety disorder   . Hemorrhoids   . History of colon polyps   . History of diabetes mellitus, type II    10-10-2017  per pt no issues after wt loss surgery   . History of pneumothorax    1983 & 1988  right spontaneous, treated w/ chest tube  . Hyperlipidemia   . Hypertension   . MDD (major depressive disorder)   . OSA (obstructive sleep apnea)    10-10-2017  not used cpap since 2016 after wt loss  surgery 2015  . Status post biliopancreatic diversion with duodenal switch    07-30-2013   @ [facility] for wt. loss surgery  . Vitamin D deficiency   . Wears glasses   . Zinc deficiency 04/27/2016    SURGICAL HISTORY: Past Surgical History:  Procedure Laterality Date  . ANAL FISTULOTOMY N/A 08/07/2016   Procedure: ANAL FISTULOTOMY;  Surgeon: Abigail Miyamoto, MD;  Location: [facility] OR;  Service: General;  Laterality: N/A;  . CHEST TUBE INSERTION  1988   right lung for spontaneous pneumothorax  . COLONOSCOPY W/ BIOPSIES  08/02/2016  . EXOSTECTECTOMY TOE Left 04/25/2017   Procedure: TARSAL EXOSTECTOMY OF MEDIAL CUNEIFORM AND EXOSTECTOMY OF THE FIRST METATARSAL;  Surgeon: Felecia Shelling, DPM;  Location: [facility] OR;  Service: Podiatry;  Laterality: Left;  Marland Kitchen GASTROPLASTY DUODENAL SWITCH  07-30-2013    @[facility]    "wt loss surgery not gastric bypass surgery" (biliopancreatic diversion w/ duodenal switch)  . HEMORRHOID SURGERY N/A 08/07/2016   Procedure: HEMORRHOIDECTOMY;  Surgeon: 08/09/2016, MD;  Location: [facility] OR;  Service: General;  Laterality: N/A;  . INCISION AND DRAINAGE ABSCESS N/A 10/14/2017   Procedure: POSSIBLE INCISION AND DRAINAGE OF ANAL ABSCESS;  Surgeon: 10/16/2017, MD;  Location: [facility];  Service: General;  Laterality: N/A;  . INGUINAL HERNIA REPAIR Left 04/10/2017   Procedure: LAPAROSCOPIC LEFT INGUINAL HERNIA REPAIR WITH MESH;  Surgeon: 04/12/2017, MD;  Location: [facility];  Service: General;  Laterality: Left;  .  INSERTION OF MESH Left 04/10/2017   Procedure: INSERTION OF MESH;  Surgeon: Abigail Miyamoto, MD;  Location: [facility];  Service: General;  Laterality: Left;  . IRRIGATION AND DEBRIDEMENT SEBACEOUS CYST     back  . MASS EXCISION N/A 01/29/2018   Procedure: EXCISION of lesion on scalp;  Surgeon: Peggye Form, DO;  Location: [facility];  Service: Plastics;  Laterality: N/A;  . RECTAL EXAM UNDER  ANESTHESIA N/A 10/14/2017   Procedure: ANAL EXAM UNDER ANESTHESIA, INJECTION OF FIBRIN GLUE;  Surgeon: Andria Meuse, MD;  Location: [facility];  Service: General;  Laterality: N/A;    SOCIAL HISTORY: Social History   Socioeconomic History  . Marital status: Single    Spouse name: Not on file  . Number of children: 0  . Years of education: Not on file  . Highest education level: Not on file  Occupational History  . Occupation: Airline pilot  Tobacco Use  . Smoking status: Current Every Day Smoker    Packs/day: 0.50    Years: 50.00    Pack years: 25.00    Types: Cigarettes  . Smokeless tobacco: Never Used  Vaping Use  . Vaping Use: Never used  Substance and Sexual Activity  . Alcohol use: Yes    Alcohol/week: 0.0 standard drinks    Comment: rare  . Drug use: No  . Sexual activity: Not on file  Other Topics Concern  . Not on file  Social History Narrative   Regular exercise-no   Right handed   Lives alone   Caffeine: 2 cups/day   Social Determinants of Health   Financial Resource Strain: Not on file  Food Insecurity: Not on file  Transportation Needs: Not on file  Physical Activity: Not on file  Stress: Not on file  Social Connections: Not on file  Intimate Partner Violence: Not on file    FAMILY HISTORY: Family History  Problem Relation Age of Onset  . Cancer Mother        ovarian  . Hyperlipidemia Father   . Hypertension Father   . Heart disease Father        MI 7/19  . Emphysema Father   . Pulmonary disease Father   . Heart disease Maternal Grandmother   . Heart disease Maternal Grandfather   . Stroke Maternal Grandfather   . Heart disease Paternal Grandmother   . Angina Paternal Grandmother   . Heart disease Paternal Grandfather   . Stroke Paternal Grandfather   . Diabetes Maternal Uncle   . Kidney disease Maternal Uncle   . Diabetes Paternal Uncle   . Cancer Paternal Uncle   . Neuropathy Neg Hx     ALLERGIES:  has No  Known Allergies.  MEDICATIONS:  Current Outpatient Medications  Medication Sig Dispense Refill  . albuterol (VENTOLIN HFA) 108 (90 Base) MCG/ACT inhaler Take 2 puffs 10-15 minutes prior to exercise. 18 g 2  . amphetamine-dextroamphetamine (ADDERALL) 20 MG tablet Take 1 tablet (20 mg total) by mouth 2 (two) times daily. 60 tablet 0  . buPROPion (WELLBUTRIN XL) 150 MG 24 hr tablet TAKE ONE TABLET BY MOUTH THREE TIMES A DAY 270 tablet 1  . BYSTOLIC 10 MG tablet TAKE ONE TABLET BY MOUTH TWICE A DAY 60 tablet 5  . Calcium Carb-Cholecalciferol 600-500 MG-UNIT CAPS Take 1 tablet by mouth daily.    . Cholecalciferol (VITAMIN D3) 1000 units CAPS Take 1,000 Units by mouth daily.    . clobetasol (TEMOVATE) 0.05 %  external solution APPLY TO AFFECTED AREA(S) DAILY AS NEEDED FOR ITCHING, USE TO SCALP AS DIRECTED 50 mL 1  . diazepam (VALIUM) 5 MG tablet TAKE ONE TO TWO TABLETS BY MOUTH EVERY 8 HOURS AS NEEDED FOR ANXIETY 90 tablet 2  . DULoxetine (CYMBALTA) 30 MG capsule TAKE 3 CAPSULES BY MOUTH EVERY DAY 270 capsule 2  . finasteride (PROPECIA) 1 MG tablet TAKE ONE TABLET BY MOUTH DAILY 30 tablet 1  . lisinopril (ZESTRIL) 2.5 MG tablet Take 1 tablet (2.5 mg total) by mouth daily. 90 tablet 3  . metoprolol succinate (TOPROL-XL) 50 MG 24 hr tablet TAKE ONE TABLET BY MOUTH DAILY. TAKE WITH OR IMMEDIATELY FOLLOWING A MEAL 90 tablet 1  . Multiple Vitamins-Minerals (CENTRUM SILVER) tablet Take 2 tablets by mouth daily.    . Omega-3 300 MG CAPS Take 300 mg by mouth daily.    . pregabalin (LYRICA) 75 MG capsule Take 1 capsule (75 mg total) by mouth at bedtime as needed (RLS). 30 capsule 2  . rOPINIRole (REQUIP) 1 MG tablet Take 2 tablets (2 mg total) by mouth at bedtime. 60 tablet 3  . sildenafil (VIAGRA) 100 MG tablet TAKE ONE TABLET BY MOUTH DAILY AS NEEDED FOR FOR ERECTILE DYSFUNCTION 30 tablet 7  . tamsulosin (FLOMAX) 0.4 MG CAPS capsule Take 1 capsule (0.4 mg total) by mouth daily after supper. 90 capsule 1  .  testosterone cypionate (DEPO-TESTOSTERONE) 200 MG/ML injection Inject 1 mL (200 mg total) into the muscle every 14 (fourteen) days. 10 mL 0  . VITAMIN A PO Take 2,400 Units by mouth daily.    . Vitamin D, Ergocalciferol, (DRISDOL) 1.25 MG (50000 UNIT) CAPS capsule TAKE ONE CAPSULE BY MOUTH EVERY 7 DAYS 12 capsule 1  . vitamin E 200 UNIT capsule Take 200 Units by mouth daily.    Marland Kitchen zinc gluconate 50 MG tablet Take 100 mg by mouth daily.    Marland Kitchen zolpidem (AMBIEN) 10 MG tablet TAKE ONE TABLET BY MOUTH AT BEDTIME 30 tablet 5   No current facility-administered medications for this visit.    REVIEW OF SYSTEMS:   Constitutional: Restless legs All other systems were reviewed with the patient and are negative.  PHYSICAL EXAMINATION: ECOG PERFORMANCE STATUS: 1 - Symptomatic but completely ambulatory  Vitals:   06/08/20 1327  BP: (!) 149/88  Pulse: 65  Resp: 18  Temp: (!) 97.5 F (36.4 C)  SpO2: 98%   Filed Weights   06/08/20 1327  Weight: 185 lb 8 oz (84.1 kg)      LABORATORY DATA:  I have reviewed the data as listed Lab Results  Component Value Date   WBC 3.7 (L) 03/25/2020   HGB 12.8 (L) 03/25/2020   HCT 38.2 (L) 03/25/2020   MCV 90.1 03/25/2020   PLT 150.0 03/25/2020   Lab Results  Component Value Date   NA 142 03/25/2020   K 4.0 03/25/2020   CL 108 03/25/2020   CO2 32 03/25/2020    RADIOGRAPHIC STUDIES: I have personally reviewed the radiological reports and agreed with the findings in the report.  ASSESSMENT AND PLAN:  Iron deficiency anemia Lab review 06/15/19: Ferritin 4.9, Iron sat: 7%, Folate 2.5,  03/25/20: Ferritin 7.5, Iron sat: 7.5%, B 12 492, Folate 5.9, Hb 12.8, MCV 90, RDW 15  Status post biliopancreatic diversion with duodenal switch 2015 at [facility]  Iron deficiency anemia: I discussed with the patient the process of iron absorption. I counseled extensively regarding the different causes of iron deficiency including blood  loss and malabsorption. Patient has  had upper endoscopies and colonoscopies and did not have any clear identified source of blood loss. It is possible that the patient may still have an occult source of bleeding. However malabsorption is a strong possibility.   Recommendation: IV iron infusions  I discussed with the patient that potentially there may be a need for additional IV iron infusions if the iron levels were to remain low in the future. The frequency of need of IV iron would depend on many other factors including the rate of loss and by the degree of absorption.  Return to clinic in 3 months with recheck on iron studies and hemoglobin.     All questions were answered. The patient knows to call the clinic with any problems, questions or concerns.   Sabas SousVinay K Shantella Blubaugh, MD, MPH 06/08/2020    I, Molly Dorshimer, am acting as scribe for Serena CroissantVinay Courney Garrod, MD.  I have reviewed the above documentation for accuracy and completeness, and I agree with the above.

## 2020-06-08 ENCOUNTER — Other Ambulatory Visit: Payer: Self-pay

## 2020-06-08 ENCOUNTER — Inpatient Hospital Stay: Payer: BC Managed Care – PPO | Attending: Hematology and Oncology | Admitting: Hematology and Oncology

## 2020-06-08 DIAGNOSIS — D509 Iron deficiency anemia, unspecified: Secondary | ICD-10-CM | POA: Insufficient documentation

## 2020-06-08 DIAGNOSIS — F17210 Nicotine dependence, cigarettes, uncomplicated: Secondary | ICD-10-CM | POA: Diagnosis not present

## 2020-06-08 DIAGNOSIS — G2581 Restless legs syndrome: Secondary | ICD-10-CM | POA: Insufficient documentation

## 2020-06-08 NOTE — Assessment & Plan Note (Signed)
Lab review 06/15/19: Ferritin 4.9, Iron sat: 7%, Folate 2.5,  03/25/20: Ferritin 7.5, Iron sat: 7.5%, B 12 492, Folate 5.9, Hb 12.8, MCV 90, RDW 15  Status post biliopancreatic diversion with duodenal switch 2015 at [facility]  Iron deficiency anemia: I discussed with the patient the process of iron absorption. I counseled extensively regarding the different causes of iron deficiency including blood loss and malabsorption. Patient has had upper endoscopies and colonoscopies and did not have any clear identified source of blood loss. It is possible that the patient may still have an occult source of bleeding. However malabsorption is a strong possibility.   Recommendation: IV iron infusions  I discussed with the patient that potentially there may be a need for additional IV iron infusions if the iron levels were to remain low in the future. The frequency of need of IV iron would depend on many other factors including the rate of loss and by the degree of absorption.  Return to clinic in 3 months with recheck on iron studies and hemoglobin.

## 2020-06-10 ENCOUNTER — Telehealth: Payer: Self-pay | Admitting: Hematology and Oncology

## 2020-06-10 NOTE — Telephone Encounter (Signed)
Scheduled per 3/23 los. Called and spoke with pt, confirmed all added appts

## 2020-06-13 ENCOUNTER — Inpatient Hospital Stay: Payer: BC Managed Care – PPO

## 2020-06-13 ENCOUNTER — Other Ambulatory Visit: Payer: Self-pay

## 2020-06-13 VITALS — BP 135/79 | HR 62 | Temp 98.1°F | Resp 17

## 2020-06-13 DIAGNOSIS — D509 Iron deficiency anemia, unspecified: Secondary | ICD-10-CM | POA: Diagnosis not present

## 2020-06-13 DIAGNOSIS — F17210 Nicotine dependence, cigarettes, uncomplicated: Secondary | ICD-10-CM | POA: Diagnosis not present

## 2020-06-13 DIAGNOSIS — G2581 Restless legs syndrome: Secondary | ICD-10-CM | POA: Diagnosis not present

## 2020-06-13 MED ORDER — SODIUM CHLORIDE 0.9 % IV SOLN
300.0000 mg | Freq: Once | INTRAVENOUS | Status: AC
  Administered 2020-06-13: 300 mg via INTRAVENOUS
  Filled 2020-06-13: qty 10

## 2020-06-13 MED ORDER — ACETAMINOPHEN 325 MG PO TABS
ORAL_TABLET | ORAL | Status: AC
  Filled 2020-06-13: qty 2

## 2020-06-13 MED ORDER — SODIUM CHLORIDE 0.9 % IV SOLN
Freq: Once | INTRAVENOUS | Status: AC
Start: 2020-06-13 — End: 2020-06-13
  Filled 2020-06-13: qty 250

## 2020-06-13 MED ORDER — ACETAMINOPHEN 325 MG PO TABS
650.0000 mg | ORAL_TABLET | Freq: Once | ORAL | Status: AC
  Administered 2020-06-13: 650 mg via ORAL

## 2020-06-13 NOTE — Patient Instructions (Signed)

## 2020-06-15 ENCOUNTER — Inpatient Hospital Stay: Payer: BC Managed Care – PPO

## 2020-06-15 ENCOUNTER — Other Ambulatory Visit: Payer: Self-pay

## 2020-06-15 VITALS — BP 138/76 | HR 60 | Temp 98.2°F | Resp 17

## 2020-06-15 DIAGNOSIS — D509 Iron deficiency anemia, unspecified: Secondary | ICD-10-CM

## 2020-06-15 DIAGNOSIS — G2581 Restless legs syndrome: Secondary | ICD-10-CM | POA: Diagnosis not present

## 2020-06-15 DIAGNOSIS — F17210 Nicotine dependence, cigarettes, uncomplicated: Secondary | ICD-10-CM | POA: Diagnosis not present

## 2020-06-15 MED ORDER — SODIUM CHLORIDE 0.9 % IV SOLN
300.0000 mg | Freq: Once | INTRAVENOUS | Status: AC
  Administered 2020-06-15: 300 mg via INTRAVENOUS
  Filled 2020-06-15: qty 10

## 2020-06-15 MED ORDER — SODIUM CHLORIDE 0.9 % IV SOLN
Freq: Once | INTRAVENOUS | Status: AC
  Filled 2020-06-15: qty 250

## 2020-06-15 NOTE — Progress Notes (Signed)
Pt declined to stay for post infusion observation period. Pt stated he has tolerated medication prior without difficulty. Pt aware to call clinic with any questions or concerns. Pt verbalized understanding and had no further questions.   

## 2020-06-20 ENCOUNTER — Other Ambulatory Visit: Payer: Self-pay | Admitting: Family Medicine

## 2020-06-22 ENCOUNTER — Ambulatory Visit (INDEPENDENT_AMBULATORY_CARE_PROVIDER_SITE_OTHER): Payer: BC Managed Care – PPO | Admitting: Family Medicine

## 2020-06-22 ENCOUNTER — Other Ambulatory Visit: Payer: Self-pay

## 2020-06-22 ENCOUNTER — Telehealth: Payer: Self-pay | Admitting: Neurology

## 2020-06-22 ENCOUNTER — Encounter: Payer: Self-pay | Admitting: Family Medicine

## 2020-06-22 ENCOUNTER — Ambulatory Visit: Payer: BC Managed Care – PPO | Admitting: Podiatry

## 2020-06-22 VITALS — BP 142/92 | HR 68 | Temp 98.5°F | Ht 73.0 in | Wt 187.0 lb

## 2020-06-22 DIAGNOSIS — G4700 Insomnia, unspecified: Secondary | ICD-10-CM

## 2020-06-22 DIAGNOSIS — K219 Gastro-esophageal reflux disease without esophagitis: Secondary | ICD-10-CM | POA: Diagnosis not present

## 2020-06-22 DIAGNOSIS — Z720 Tobacco use: Secondary | ICD-10-CM

## 2020-06-22 MED ORDER — TEMAZEPAM 15 MG PO CAPS: 15.0000 mg | ORAL_CAPSULE | Freq: Every evening | ORAL | 0 refills | Status: DC | PRN

## 2020-06-22 MED ORDER — NICOTINE 21 MG/24HR TD PT24: 21.0000 mg | MEDICATED_PATCH | Freq: Every day | TRANSDERMAL | 0 refills | Status: AC

## 2020-06-22 MED ORDER — NICOTINE 14 MG/24HR TD PT24: 14.0000 mg | MEDICATED_PATCH | Freq: Every day | TRANSDERMAL | 0 refills | Status: AC

## 2020-06-22 MED ORDER — VARENICLINE TARTRATE 0.5 MG X 11 & 1 MG X 42 PO MISC: ORAL | 0 refills | Status: DC

## 2020-06-22 MED ORDER — PANTOPRAZOLE SODIUM 40 MG PO TBEC
40.0000 mg | DELAYED_RELEASE_TABLET | Freq: Every day | ORAL | 1 refills | Status: DC
Start: 2020-06-22 — End: 2020-10-13

## 2020-06-22 MED ORDER — NICOTINE 7 MG/24HR TD PT24: 7.0000 mg | MEDICATED_PATCH | Freq: Every day | TRANSDERMAL | 0 refills | Status: DC

## 2020-06-22 NOTE — Progress Notes (Signed)
Chief Complaint  Patient presents with  . Gastroesophageal Reflux  . Fatigue  . Anemia    Subjective: Patient is a 65 y.o. male here for fu.  Fatigue The patient has had worsening fatigue over the past several weeks.  He received 2 iron infusions and reports actually worsening. He has another one coming up with follow up lab testing after that. He has not been sleeping well. He will intermittently take Ambien but does not feel that it is helpful. He will wake up sporadically in the middle of the night. He was on Halcion in the 80's and was transitioned to Ambien.  His diet has remained unchanged.  He is no longer having sleep apnea symptoms since losing a tremendous amount of weight following bariatric surgery.  GERD Patient has been having increasing reflux symptoms including belching and burning in his chest.  He has never tried anything at home.  He has not changed his diet.  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  No nausea, vomiting, diarrhea, bleeding.  Tobacco abuse The patient smokes around 1 pack/day.  He has smoked for nearly 50 years.  He has tried Zyban, Chantix in the past without success.  He has never tried dual therapy.  Past Medical History:  Diagnosis Date  . Anal fistula    recurrent  . Benign localized prostatic hyperplasia with lower urinary tract symptoms (LUTS)   . Bilateral lower extremity edema    feet  . COPD with emphysema (HCC)   . Diverticulosis of colon   . ED (erectile dysfunction)   . Generalized anxiety disorder   . Hemorrhoids   . History of colon polyps   . History of diabetes mellitus, type II    10-10-2017  per pt no issues after wt loss surgery   . History of pneumothorax    1983 & 1988  right spontaneous, treated w/ chest tube  . Hyperlipidemia   . Hypertension   . MDD (major depressive disorder)   . OSA (obstructive sleep apnea)    10-10-2017  not used cpap since 2016 after wt loss surgery 2015  .  Status post biliopancreatic diversion with duodenal switch    07-30-2013   @ [facility] for wt. loss surgery  . Vitamin D deficiency   . Wears glasses   . Zinc deficiency 04/27/2016    Objective: BP (!) 142/92 (BP Location: Left Arm, Patient Position: Sitting, Cuff Size: Normal)   Pulse 68   Temp 98.5 F (36.9 C) (Oral)   Ht 6\' 1"  (1.854 m)   Wt 187 lb (84.8 kg)   SpO2 97%   BMI 24.67 kg/m  General: Awake, appears stated age Lungs: No accessory muscle use Psych: Age appropriate judgment and insight, normal affect and mood  Assessment and Plan: Insomnia, unspecified type - Plan: temazepam (RESTORIL) 15 MG capsule  Gastroesophageal reflux disease, unspecified whether esophagitis present - Plan: pantoprazole (PROTONIX) 40 MG tablet  Tobacco abuse - Plan: varenicline (CHANTIX PAK) 0.5 MG X 11 & 1 MG X 42 tablet, nicotine (NICODERM CQ - DOSED IN MG/24 HOURS) 14 mg/24hr patch, nicotine (NICODERM CQ - DOSED IN MG/24 HOURS) 21 mg/24hr patch, nicotine (NICODERM CQ - DOSED IN MG/24 HR) 7 mg/24hr patch  1.  Change Ambien to Restoril. 2.  Start Protonix, mainly need for a short period of time given the stress he is having with tax season. 3.  We will start both Chantix and nicotine patches. I  will see him in 1 month recheck above. The patient voiced understanding and agreement to the plan.  Jilda Roche [place], DO 06/22/20  11:57 AM

## 2020-06-22 NOTE — Patient Instructions (Addendum)
Keep the diet clean and stay active.  The only lifestyle changes that have data behind them are weight loss for the overweight/obese and elevating the head of the bed. Finding out which foods/positions are triggers is important.  Let us know if you need anything.

## 2020-06-22 NOTE — Telephone Encounter (Signed)
[employer] Berkley Harvey: [id number] (exp. 06/22/20 to 12/18/20) order sent to GI. They will reach out to the patient to schedule.

## 2020-06-22 NOTE — Addendum Note (Signed)
Addended by: Naomie Dean B on: 06/22/2020 12:29 PM   Modules accepted: Orders

## 2020-06-23 ENCOUNTER — Other Ambulatory Visit: Payer: Self-pay | Admitting: Family Medicine

## 2020-06-23 DIAGNOSIS — L649 Androgenic alopecia, unspecified: Secondary | ICD-10-CM

## 2020-06-25 DIAGNOSIS — Z720 Tobacco use: Secondary | ICD-10-CM

## 2020-06-27 ENCOUNTER — Other Ambulatory Visit: Payer: Self-pay

## 2020-06-27 ENCOUNTER — Ambulatory Visit: Payer: BC Managed Care – PPO

## 2020-06-27 ENCOUNTER — Other Ambulatory Visit: Payer: Self-pay | Admitting: Family Medicine

## 2020-06-27 ENCOUNTER — Ambulatory Visit (INDEPENDENT_AMBULATORY_CARE_PROVIDER_SITE_OTHER): Payer: BC Managed Care – PPO | Admitting: Podiatry

## 2020-06-27 DIAGNOSIS — L84 Corns and callosities: Secondary | ICD-10-CM | POA: Diagnosis not present

## 2020-06-27 DIAGNOSIS — M79671 Pain in right foot: Secondary | ICD-10-CM

## 2020-06-27 DIAGNOSIS — M79672 Pain in left foot: Secondary | ICD-10-CM

## 2020-06-27 DIAGNOSIS — Z720 Tobacco use: Secondary | ICD-10-CM

## 2020-06-27 DIAGNOSIS — E291 Testicular hypofunction: Secondary | ICD-10-CM | POA: Diagnosis not present

## 2020-06-27 MED ORDER — VARENICLINE TARTRATE 0.5 MG X 11 & 1 MG X 42 PO MISC: ORAL | 0 refills | Status: DC

## 2020-06-27 NOTE — Progress Notes (Signed)
   Subjective: 65 y.o. male presenting to the office today as a reestablish new patient for evaluation of symptomatic painful callus lesions to the subfifth MTPJ of the bilateral feet.  Patient states that they are very symptomatic and he tries to trim them down himself.  They are not symptomatic in shoes but they hurt when he goes barefoot.  He presents for further treatment and evaluation   Past Medical History:  Diagnosis Date  . Anal fistula    recurrent  . Benign localized prostatic hyperplasia with lower urinary tract symptoms (LUTS)   . Bilateral lower extremity edema    feet  . COPD with emphysema (HCC)   . Diverticulosis of colon   . ED (erectile dysfunction)   . Generalized anxiety disorder   . Hemorrhoids   . History of colon polyps   . History of diabetes mellitus, type II    10-10-2017  per pt no issues after wt loss surgery   . History of pneumothorax    1983 & 1988  right spontaneous, treated w/ chest tube  . Hyperlipidemia   . Hypertension   . MDD (major depressive disorder)   . OSA (obstructive sleep apnea)    10-10-2017  not used cpap since 2016 after wt loss surgery 2015  . Status post biliopancreatic diversion with duodenal switch    07-30-2013   @ [facility] for wt. loss surgery  . Vitamin D deficiency   . Wears glasses   . Zinc deficiency 04/27/2016     Objective:  Physical Exam General: Alert and oriented x3 in no acute distress  Dermatology: Hyperkeratotic lesion(s) present on the subfifth MTPJ bilateral right greater than the left. Pain on palpation with a central nucleated core noted. Skin is warm, dry and supple bilateral lower extremities. Negative for open lesions or macerations.  Vascular: Palpable pedal pulses bilaterally. No edema or erythema noted. Capillary refill within normal limits.  Neurological: Epicritic and protective threshold grossly intact bilaterally.   Musculoskeletal Exam: Pain on palpation at the keratotic lesion(s) noted. Range of  motion within normal limits bilateral. Muscle strength 5/5 in all groups bilateral.  Assessment: 1.  Preulcerative callus lesions bilateral fifth MTPJ right greater than left 2.  Pain in both feet   Plan of Care:  1. Patient evaluated 2. Excisional debridement of keratoic lesion(s) using a chisel blade was performed without incident.  3.  Recommend pumice stone for a light debrider to keep the calluses shaved down  4. Patient is to return to the clinic PRN.   Felecia Shelling, DPM [facility]  Dr. Felecia Shelling, DPM    [address] [place], [place] [address]                Office [phone]  Fax [phone]

## 2020-06-29 ENCOUNTER — Other Ambulatory Visit: Payer: Self-pay

## 2020-06-29 ENCOUNTER — Inpatient Hospital Stay: Payer: BC Managed Care – PPO | Attending: Hematology and Oncology

## 2020-06-29 VITALS — BP 138/77 | HR 64 | Temp 98.6°F

## 2020-06-29 DIAGNOSIS — D509 Iron deficiency anemia, unspecified: Secondary | ICD-10-CM | POA: Diagnosis not present

## 2020-06-29 MED ORDER — SODIUM CHLORIDE 0.9 % IV SOLN
Freq: Once | INTRAVENOUS | Status: AC
  Filled 2020-06-29: qty 250

## 2020-06-29 MED ORDER — SODIUM CHLORIDE 0.9 % IV SOLN
300.0000 mg | Freq: Once | INTRAVENOUS | Status: AC
  Administered 2020-06-29: 300 mg via INTRAVENOUS
  Filled 2020-06-29: qty 10

## 2020-06-29 NOTE — Patient Instructions (Signed)

## 2020-07-02 ENCOUNTER — Ambulatory Visit
Admission: EM | Admit: 2020-07-02 | Discharge: 2020-07-02 | Disposition: A | Discharge status: Home / Self Care | Payer: BC Managed Care – PPO | Attending: Emergency Medicine | Admitting: Emergency Medicine

## 2020-07-02 ENCOUNTER — Other Ambulatory Visit: Payer: Self-pay

## 2020-07-02 ENCOUNTER — Ambulatory Visit: Payer: Self-pay

## 2020-07-02 DIAGNOSIS — L0231 Cutaneous abscess of buttock: Secondary | ICD-10-CM

## 2020-07-02 MED ORDER — RIFAMPIN 300 MG PO CAPS: 300.0000 mg | ORAL_CAPSULE | Freq: Two times a day (BID) | ORAL | 0 refills | Status: AC

## 2020-07-02 NOTE — ED Triage Notes (Signed)
Pt present a abscess on his left check/buttock. Pt states the area is gotten bigger and hard. Pt states it been there for a week.

## 2020-07-02 NOTE — Discharge Instructions (Signed)
Begin rifampicin twice daily Packing removal in 48 hours Warm compresses Follow up for any concerns

## 2020-07-02 NOTE — ED Provider Notes (Signed)
[facility]    CSN: [id number] Arrival date & time: 07/02/20  0948      History   Chief Complaint Chief Complaint  Patient presents with  . Abscess    HPI Daniel [name] is a 65 y.o. male history of BPH, COPD, hypertension, hyperlipidemia, presenting today for evaluation of abscess.  Reports abscess to left buttock x1 week.  Recently becoming more swollen and enlarged.  Reports recurrent history of abscess in similar area over the past 20 years.  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  He has been on doxycycline twice a day over the past week and symptoms have worsened.  Reports improvement with rifampicin as alternative in the past.  HPI  Past Medical History:  Diagnosis Date  . Anal fistula    recurrent  . Benign localized prostatic hyperplasia with lower urinary tract symptoms (LUTS)   . Bilateral lower extremity edema    feet  . COPD with emphysema (HCC)   . Diverticulosis of colon   . ED (erectile dysfunction)   . Generalized anxiety disorder   . Hemorrhoids   . History of colon polyps   . History of diabetes mellitus, type II    10-10-2017  per pt no issues after wt loss surgery   . History of pneumothorax    1983 & 1988  right spontaneous, treated w/ chest tube  . Hyperlipidemia   . Hypertension   . MDD (major depressive disorder)   . OSA (obstructive sleep apnea)    10-10-2017  not used cpap since 2016 after wt loss surgery 2015  . Status post biliopancreatic diversion with duodenal switch    07-30-2013   @ [facility] for wt. loss surgery  . Vitamin D deficiency   . Wears glasses   . Zinc deficiency 04/27/2016    Patient Active Problem List   Diagnosis Date Noted  . Gastroesophageal reflux disease 06/22/2020  . Iron deficiency anemia 06/08/2020  . Pulmonary emphysema (HCC) 01/20/2020  . RLS (restless legs syndrome) 06/15/2019  . Anxiety 10/17/2018  . Changing skin lesion 12/24/2017  . Dehydration 10/30/2016  . Dysfunction of  both eustachian tubes 10/30/2016  . Tongue burning sensation 09/18/2016  . Left lower quadrant pain 06/29/2016  . Bloating 06/29/2016  . Rectal bleeding 06/29/2016  . Zinc deficiency 04/27/2016  . H/O gastric bypass 04/27/2016  . 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 Diarrhea 12/19/2015  . Hemorrhoids 05/22/2015  . Osteoarthritis 01/13/2013  . Unsteadiness 02/27/2012  . BPH (benign prostatic hyperplasia) 08/22/2011  . Diabetes mellitus type 2 in nonobese (HCC) 05/21/2011  . Vitamin D deficiency 05/21/2011  . Constipation 01/12/2011  . Scrotal cyst 12/18/2010  . Annual physical exam 09/17/2010  . Shoulder pain, left 07/16/2010  . Low back pain 07/16/2010  . Insomnia 03/16/2010  . OBSTRUCTIVE SLEEP APNEA 02/20/2010  . Tobacco abuse 01/26/2010  . PERIPHERAL EDEMA 01/26/2010  . SNORING 01/26/2010  . Hypotestosteronemia 03/24/2009  . Hyperlipidemia 03/24/2009  . ANXIETY DEPRESSION 03/24/2009  . Essential hypertension 03/24/2009    Past Surgical History:  Procedure Laterality Date  . ANAL FISTULOTOMY N/A 08/07/2016   Procedure: ANAL FISTULOTOMY;  Surgeon: Abigail Miyamoto, MD;  Location: [facility] OR;  Service: General;  Laterality: N/A;  . CHEST TUBE INSERTION  1988   right lung for spontaneous pneumothorax  . COLONOSCOPY W/ BIOPSIES  08/02/2016  . EXOSTECTECTOMY TOE Left 04/25/2017   Procedure: TARSAL EXOSTECTOMY OF MEDIAL CUNEIFORM AND EXOSTECTOMY OF THE FIRST METATARSAL;  Surgeon: Felecia Shelling, DPM;  Location: [facility] OR;  Service: Podiatry;  Laterality: Left;  Marland Kitchen GASTROPLASTY DUODENAL SWITCH  07-30-2013    @[facility]    "wt loss surgery not gastric bypass surgery" (biliopancreatic diversion w/ duodenal switch)  . HEMORRHOID SURGERY N/A 08/07/2016   Procedure: HEMORRHOIDECTOMY;  Surgeon: 08/09/2016, MD;  Location: [facility] OR;  Service: General;  Laterality: N/A;  . INCISION AND DRAINAGE ABSCESS N/A 10/14/2017   Procedure: POSSIBLE INCISION AND DRAINAGE OF  ANAL ABSCESS;  Surgeon: 10/16/2017, MD;  Location: [facility];  Service: General;  Laterality: N/A;  . INGUINAL HERNIA REPAIR Left 04/10/2017   Procedure: LAPAROSCOPIC LEFT INGUINAL HERNIA REPAIR WITH MESH;  Surgeon: 04/12/2017, MD;  Location: [facility];  Service: General;  Laterality: Left;  . INSERTION OF MESH Left 04/10/2017   Procedure: INSERTION OF MESH;  Surgeon: 04/12/2017, MD;  Location: [facility];  Service: General;  Laterality: Left;  . IRRIGATION AND DEBRIDEMENT SEBACEOUS CYST     back  . MASS EXCISION N/A 01/29/2018   Procedure: EXCISION of lesion on scalp;  Surgeon: 01/31/2018, DO;  Location: [facility];  Service: Plastics;  Laterality: N/A;  . RECTAL EXAM UNDER ANESTHESIA N/A 10/14/2017   Procedure: ANAL EXAM UNDER ANESTHESIA, INJECTION OF FIBRIN GLUE;  Surgeon: 10/16/2017, MD;  Location: [facility];  Service: General;  Laterality: N/A;       Home Medications    Prior to Admission medications   Medication Sig Start Date End Date Taking? Authorizing Provider  rifampin (RIFADIN) 300 MG capsule Take 1 capsule (300 mg total) by mouth 2 (two) times daily for 10 days. 07/02/20 07/12/20 Yes Samaia Iwata C, PA-C  albuterol (VENTOLIN HFA) 108 (90 Base) MCG/ACT inhaler Take 2 puffs 10-15 minutes prior to exercise. 01/20/20   13/3/21, DO  amphetamine-dextroamphetamine (ADDERALL) 20 MG tablet Take 1 tablet (20 mg total) by mouth 2 (two) times daily. 10/05/19   10/07/19, DO  buPROPion (WELLBUTRIN XL) 150 MG 24 hr tablet TAKE ONE TABLET BY MOUTH THREE TIMES A DAY 11/02/19   Wendling, 11/04/19, DO  BYSTOLIC 10 MG tablet TAKE ONE TABLET BY MOUTH TWICE A DAY 01/22/20   Wendling, 13/5/21, DO  Calcium Carb-Cholecalciferol 600-500 MG-UNIT CAPS Take 1 tablet by mouth daily.    [provider]  Cholecalciferol (VITAMIN D3) 1000 units  CAPS Take 1,000 Units by mouth daily.    [provider]  clobetasol (TEMOVATE) 0.05 % external solution APPLY TO AFFECTED AREA(S) DAILY AS NEEDED FOR ITCHING, USE TO SCALP AS DIRECTED 11/09/19   11/11/19, Carmelia Roller, DO  diazepam (VALIUM) 5 MG tablet TAKE ONE TO TWO TABLETS BY MOUTH EVERY 8 HOURS AS NEEDED FOR ANXIETY 01/22/20   Wendling, 13/5/21, DO  DULoxetine (CYMBALTA) 30 MG capsule TAKE 3 CAPSULES BY MOUTH EVERY DAY 02/11/19   02/13/19, DO  finasteride (PROPECIA) 1 MG tablet TAKE ONE TABLET BY MOUTH DAILY 06/23/20   08/23/20, DO  folic acid (FOLATE) 400 MCG tablet  09/17/19   [provider]  lisinopril (ZESTRIL) 2.5 MG tablet Take 1 tablet (2.5 mg total) by mouth daily. 07/21/19   09/20/19, DO  metoprolol succinate (TOPROL-XL) 50 MG 24 hr tablet TAKE ONE TABLET BY MOUTH DAILY. TAKE WITH OR IMMEDIATELY FOLLOWING A MEAL 12/22/19   Wendling, 02/21/20, DO  Multiple Vitamins-Minerals (CENTRUM SILVER) tablet Take 2  tablets by mouth daily. 05/12/15   Bradd Canary, MD  nicotine (NICODERM CQ - DOSED IN MG/24 HOURS) 14 mg/24hr patch Place 1 patch (14 mg total) onto the skin daily for 14 days. 08/03/20 08/17/20  Sharlene Dory, DO  nicotine (NICODERM CQ - DOSED IN MG/24 HOURS) 21 mg/24hr patch Place 1 patch (21 mg total) onto the skin daily. 06/22/20 08/03/20  Sharlene Dory, DO  nicotine (NICODERM CQ - DOSED IN MG/24 HR) 7 mg/24hr patch Place 1 patch (7 mg total) onto the skin daily. 08/17/20   Sharlene Dory, DO  Omega-3 300 MG CAPS Take 300 mg by mouth daily.    [provider]  pantoprazole (PROTONIX) 40 MG tablet Take 1 tablet (40 mg total) by mouth daily. 06/22/20   Sharlene Dory, DO  pregabalin (LYRICA) 75 MG capsule Take 1 capsule (75 mg total) by mouth at bedtime as needed (RLS). 03/25/20   Sharlene Dory, DO  rOPINIRole (REQUIP) 1 MG tablet Take 2 tablets (2 mg total) by mouth at  bedtime. 03/25/20   Sharlene Dory, DO  sildenafil (VIAGRA) 100 MG tablet TAKE ONE TABLET BY MOUTH DAILY AS NEEDED FOR FOR ERECTILE DYSFUNCTION 01/20/20   Sharlene Dory, DO  tamsulosin (FLOMAX) 0.4 MG CAPS capsule Take 1 capsule (0.4 mg total) by mouth daily after supper. 03/23/20   Sharlene Dory, DO  temazepam (RESTORIL) 15 MG capsule Take 1 capsule (15 mg total) by mouth at bedtime as needed for sleep. 06/22/20   Sharlene Dory, DO  testosterone cypionate (DEPO-TESTOSTERONE) 200 MG/ML injection Inject 1 mL (200 mg total) into the muscle every 14 (fourteen) days. 10/05/19   Sharlene Dory, DO  varenicline (CHANTIX PAK) 0.5 MG X 11 & 1 MG X 42 tablet Take 0.5 mg tab by mouth daily for 3 days, then increase to 0.5 mg tab twice daily for 4 days, then increase to one 1 mg tab twice daily. 06/27/20   Sharlene Dory, DO  VITAMIN A PO Take 2,400 Units by mouth daily.    [provider]  Vitamin D, Ergocalciferol, (DRISDOL) 1.25 MG (50000 UNIT) CAPS capsule TAKE ONE CAPSULE BY MOUTH EVERY 7 DAYS 01/12/20   Sharlene Dory, DO  vitamin E 200 UNIT capsule Take 200 Units by mouth daily.    [provider]  vitamin k 100 MCG tablet  09/17/19   [provider]  Zinc 50 MG CAPS Take 1 capsule by mouth daily.    [provider]  zinc gluconate 50 MG tablet Take 100 mg by mouth daily.    [provider]    Family History Family History  Problem Relation Age of Onset  . Cancer Mother        ovarian  . Hyperlipidemia Father   . Hypertension Father   . Heart disease Father        MI 7/19  . Emphysema Father   . Pulmonary disease Father   . Heart disease Maternal Grandmother   . Heart disease Maternal Grandfather   . Stroke Maternal Grandfather   . Heart disease Paternal Grandmother   . Angina Paternal Grandmother   . Heart disease Paternal Grandfather   . Stroke Paternal Grandfather   . Diabetes Maternal  Uncle   . Kidney disease Maternal Uncle   . Diabetes Paternal Uncle   . Cancer Paternal Uncle   . Neuropathy Neg Hx     Social History Social History  Tobacco Use  . Smoking status: Current Every Day Smoker    Packs/day: 0.50    Years: 50.00    Pack years: 25.00    Types: Cigarettes  . Smokeless tobacco: Never Used  Vaping Use  . Vaping Use: Never used  Substance Use Topics  . Alcohol use: Yes    Alcohol/week: 0.0 standard drinks    Comment: rare  . Drug use: No     Allergies   Patient has no known allergies.   Review of Systems Review of Systems  Constitutional: Negative for fatigue and fever.  Eyes: Negative for redness, itching and visual disturbance.  Respiratory: Negative for shortness of breath.   Cardiovascular: Negative for chest pain and leg swelling.  Gastrointestinal: Negative for nausea and vomiting.  Musculoskeletal: Negative for arthralgias and myalgias.  Skin: Positive for color change. Negative for rash and wound.  Neurological: Negative for dizziness, syncope, weakness, light-headedness and headaches.     Physical Exam Triage Vital Signs ED Triage Vitals [07/02/20 1000]  Enc Vitals Group     BP 123/75     Pulse Rate 71     Resp 16     Temp 98.4 F (36.9 C)     Temp Source Oral     SpO2 95 %     Weight      Height      Head Circumference      Peak Flow      Pain Score 8     Pain Loc      Pain Edu?      Excl. in GC?    No data found.  Updated Vital Signs BP 123/75 (BP Location: Right Arm)   Pulse 71   Temp 98.4 F (36.9 C) (Oral)   Resp 16   SpO2 95%   Visual Acuity Right Eye Distance:   Left Eye Distance:   Bilateral Distance:    Right Eye Near:   Left Eye Near:    Bilateral Near:     Physical Exam Vitals and nursing note reviewed.  Constitutional:      Appearance: He is well-developed.     Comments: No acute distress  HENT:     Head: Normocephalic and atraumatic.     Nose: Nose normal.  Eyes:      Conjunctiva/sclera: Conjunctivae normal.  Cardiovascular:     Rate and Rhythm: Normal rate.  Pulmonary:     Effort: Pulmonary effort is normal. No respiratory distress.  Abdominal:     General: There is no distension.  Musculoskeletal:        General: Normal range of motion.     Cervical back: Neck supple.  Skin:    General: Skin is warm and dry.     Comments: Left mid buttocks with large area of erythema and induration with minimal central fluctuance  Neurological:     Mental Status: He is alert and oriented to person, place, and time.      UC Treatments / Results  Labs (all labs ordered are listed, but only abnormal results are displayed) Labs Reviewed - No data to display  EKG   Radiology No results found.  Procedures Incision and Drainage  Date/Time: 07/02/2020 11:35 AM Performed by: Cosandra Plouffe, Violet C, PA-C Authorized by: Lurdes Haltiwanger, Junius Creamer, PA-C   Consent:    Consent obtained:  Verbal   Consent given by:  Patient   Risks, benefits, and alternatives were discussed: yes     Risks discussed:  Bleeding, pain and  incomplete drainage   Alternatives discussed:  Alternative treatment Universal protocol:    Patient identity confirmed:  Verbally with patient Location:    Type:  Abscess   Size:  6 cm   Location: Left buttock. Pre-procedure details:    Skin preparation:  Povidone-iodine Sedation:    Sedation type:  None Anesthesia:    Anesthesia method:  Local infiltration   Local anesthetic:  Lidocaine 1% w/o epi Procedure type:    Complexity:  Simple Procedure details:    Incision types:  Single straight   Incision depth:  Subcutaneous   Wound management:  Probed and deloculated   Drainage:  Bloody and purulent   Drainage amount:  Moderate   Packing materials:  1/4 in iodoform gauze Post-procedure details:    Procedure completion:  Tolerated well, no immediate complications   (including critical care time)  Medications Ordered in UC Medications - No  data to display  Initial Impression / Assessment and Plan / UC Course  I have reviewed the triage vital signs and the nursing notes.  Pertinent labs & imaging results that were available during my care of the patient were reviewed by me and considered in my medical decision making (see chart for details).     Left buttock abscess-I&D performed, packing placed, patient return for packing removal and wound check in 48 hours, rifampicin prescribed for patient per his request.  Continue to monitor, no signs of extension towards rectum at this time.  Discussed strict return precautions. Patient verbalized understanding and is agreeable with plan.  Final Clinical Impressions(s) / UC Diagnoses   Final diagnoses:  Left buttock abscess     Discharge Instructions     Begin rifampicin twice daily Packing removal in 48 hours Warm compresses Follow up for any concerns    ED Prescriptions    Medication Sig Dispense Auth. Provider   rifampin (RIFADIN) 300 MG capsule Take 1 capsule (300 mg total) by mouth 2 (two) times daily for 10 days. 20 capsule Emiliya Chretien, Bellevue[place]ie C, PA-C     PDMP not reviewed this encounter.   Lew DawesWieters, Quintana Canelo C, [place] JerseyPA-C 07/02/20 1136

## 2020-07-04 ENCOUNTER — Ambulatory Visit: Payer: Self-pay

## 2020-07-05 ENCOUNTER — Other Ambulatory Visit: Payer: Self-pay

## 2020-07-05 ENCOUNTER — Ambulatory Visit
Admission: RE | Admit: 2020-07-05 | Discharge: 2020-07-05 | Disposition: A | Discharge status: Home / Self Care | Payer: BC Managed Care – PPO | Source: Ambulatory Visit | Attending: Neurology | Admitting: Neurology

## 2020-07-05 DIAGNOSIS — W19XXXA Unspecified fall, initial encounter: Secondary | ICD-10-CM

## 2020-07-05 DIAGNOSIS — R2689 Other abnormalities of gait and mobility: Secondary | ICD-10-CM

## 2020-07-05 DIAGNOSIS — R202 Paresthesia of skin: Secondary | ICD-10-CM

## 2020-07-05 DIAGNOSIS — R270 Ataxia, unspecified: Secondary | ICD-10-CM | POA: Diagnosis not present

## 2020-07-05 MED ORDER — VARENICLINE TARTRATE 0.5 MG X 11 & 1 MG X 42 PO MISC: ORAL | 0 refills | Status: DC

## 2020-07-07 ENCOUNTER — Other Ambulatory Visit: Payer: Self-pay | Admitting: Family Medicine

## 2020-07-07 DIAGNOSIS — E291 Testicular hypofunction: Secondary | ICD-10-CM | POA: Diagnosis not present

## 2020-07-30 ENCOUNTER — Other Ambulatory Visit: Payer: Self-pay | Admitting: Family Medicine

## 2020-07-30 DIAGNOSIS — G4700 Insomnia, unspecified: Secondary | ICD-10-CM

## 2020-08-01 NOTE — Telephone Encounter (Signed)
Requesting: temazepam 15mg   Contract: None UDS: 02/28/2018 Last Visit: 06/22/2020 Next Visit: None Last Refill: 06/22/2020 #30 and 0RF Pt sig: 1 tab qhs prn  Please Advise

## 2020-08-05 DIAGNOSIS — F330 Major depressive disorder, recurrent, mild: Secondary | ICD-10-CM | POA: Diagnosis not present

## 2020-08-05 DIAGNOSIS — F411 Generalized anxiety disorder: Secondary | ICD-10-CM | POA: Diagnosis not present

## 2020-08-14 ENCOUNTER — Other Ambulatory Visit: Payer: Self-pay | Admitting: Family Medicine

## 2020-08-20 ENCOUNTER — Other Ambulatory Visit: Payer: Self-pay | Admitting: Family Medicine

## 2020-08-28 ENCOUNTER — Encounter: Payer: Self-pay | Admitting: Podiatry

## 2020-09-17 ENCOUNTER — Other Ambulatory Visit: Payer: Self-pay | Admitting: Family Medicine

## 2020-09-17 DIAGNOSIS — L649 Androgenic alopecia, unspecified: Secondary | ICD-10-CM

## 2020-09-19 ENCOUNTER — Other Ambulatory Visit: Payer: Self-pay | Admitting: Family Medicine

## 2020-09-27 ENCOUNTER — Encounter: Payer: Self-pay | Admitting: Family Medicine

## 2020-09-27 ENCOUNTER — Ambulatory Visit: Payer: BC Managed Care – PPO | Admitting: Family Medicine

## 2020-09-27 ENCOUNTER — Other Ambulatory Visit: Payer: Self-pay

## 2020-09-27 VITALS — BP 108/72 | HR 71 | Temp 98.0°F | Ht 73.0 in | Wt 188.5 lb

## 2020-09-27 DIAGNOSIS — M542 Cervicalgia: Secondary | ICD-10-CM

## 2020-09-27 DIAGNOSIS — K644 Residual hemorrhoidal skin tags: Secondary | ICD-10-CM

## 2020-09-27 DIAGNOSIS — J069 Acute upper respiratory infection, unspecified: Secondary | ICD-10-CM | POA: Diagnosis not present

## 2020-09-27 MED ORDER — HYDROCODONE BIT-HOMATROP MBR 5-1.5 MG/5ML PO SOLN: 5.0000 mL | Freq: Four times a day (QID) | ORAL | 0 refills | Status: DC | PRN

## 2020-09-27 MED ORDER — MELOXICAM 15 MG PO TABS: 15.0000 mg | ORAL_TABLET | Freq: Every day | ORAL | 0 refills | Status: DC

## 2020-09-27 MED ORDER — HYDROCORTISONE 2.5 % EX CREA: TOPICAL_CREAM | Freq: Two times a day (BID) | CUTANEOUS | 0 refills | Status: DC

## 2020-09-27 NOTE — Patient Instructions (Addendum)
Heat (pad or rice pillow in microwave) over affected area, 10-15 minutes twice daily.   Ice/cold pack over area for 10-15 min twice daily.  OK to take Tylenol 1000 mg (2 extra strength tabs) or 975 mg (3 regular strength tabs) every 6 hours as needed.  Try MiraLAX 1-2 times daily over the next 3-4 days. If no improvement, try using an enema. Stay well hydrated and keep lots of fiber in your diet.  Do not drink alcohol, do any illicit/street drugs, drive or do anything that requires alertness while on the syrup.   Let us know if you need anything.  EXERCISES RANGE OF MOTION (ROM) AND STRETCHING EXERCISES  These exercises may help you when beginning to rehabilitate your issue. In order to successfully resolve your symptoms, you must improve your posture. These exercises are designed to help reduce the forward-head and rounded-shoulder posture which contributes to this condition. Your symptoms may resolve with or without further involvement from your physician, physical therapist or athletic trainer. While completing these exercises, remember:  Restoring tissue flexibility helps normal motion to return to the joints. This allows healthier, less painful movement and activity. An effective stretch should be held for at least 20 seconds, although you may need to begin with shorter hold times for comfort. A stretch should never be painful. You should only feel a gentle lengthening or release in the stretched tissue. Do not do any stretch or exercise that you cannot tolerate.  STRETCH- Axial Extensors Lie on your back on the floor. You may bend your knees for comfort. Place a rolled-up hand towel or dish towel, about 2 inches in diameter, under the part of your head that makes contact with the floor. Gently tuck your chin, as if trying to make a "double chin," until you feel a gentle stretch at the base of your head. Hold 15-20 seconds. Repeat 2-3 times. Complete this exercise 1 time per day.    STRETCH - Axial Extension  Stand or sit on a firm surface. Assume a good posture: chest up, shoulders drawn back, abdominal muscles slightly tense, knees unlocked (if standing) and feet hip width apart. Slowly retract your chin so your head slides back and your chin slightly lowers. Continue to look straight ahead. You should feel a gentle stretch in the back of your head. Be certain not to feel an aggressive stretch since this can cause headaches later. Hold for 15-20 seconds. Repeat 2-3 times. Complete this exercise 1 time per day.  STRETCH - Cervical Side Bend  Stand or sit on a firm surface. Assume a good posture: chest up, shoulders drawn back, abdominal muscles slightly tense, knees unlocked (if standing) and feet hip width apart. Without letting your nose or shoulders move, slowly tip your right / left ear to your shoulder until your feel a gentle stretch in the muscles on the opposite side of your neck. Hold 15-20 seconds. Repeat 2-3 times. Complete this exercise 1-2 times per day.  STRETCH - Cervical Rotators  Stand or sit on a firm surface. Assume a good posture: chest up, shoulders drawn back, abdominal muscles slightly tense, knees unlocked (if standing) and feet hip width apart. Keeping your eyes level with the ground, slowly turn your head until you feel a gentle stretch along the back and opposite side of your neck. Hold 15-20 seconds. Repeat 2-3 times. Complete this exercise 1-2 times per day.  RANGE OF MOTION - Neck Circles  Stand or sit on a firm surface. Assume a  good posture: chest up, shoulders drawn back, abdominal muscles slightly tense, knees unlocked (if standing) and feet hip width apart. Gently roll your head down and around from the back of one shoulder to the back of the other. The motion should never be forced or painful. Repeat the motion 10-20 times, or until you feel the neck muscles relax and loosen. Repeat 2-3 times. Complete the exercise 1-2 times per  day. STRENGTHENING EXERCISES - Cervical Strain and Sprain These exercises may help you when beginning to rehabilitate your injury. They may resolve your symptoms with or without further involvement from your physician, physical therapist, or athletic trainer. While completing these exercises, remember:  Muscles can gain both the endurance and the strength needed for everyday activities through controlled exercises. Complete these exercises as instructed by your physician, physical therapist, or athletic trainer. Progress the resistance and repetitions only as guided. You may experience muscle soreness or fatigue, but the pain or discomfort you are trying to eliminate should never worsen during these exercises. If this pain does worsen, stop and make certain you are following the directions exactly. If the pain is still present after adjustments, discontinue the exercise until you can discuss the trouble with your clinician.  STRENGTH - Cervical Flexors, Isometric Face a wall, standing about 6 inches away. Place a small pillow, a ball about 6-8 inches in diameter, or a folded towel between your forehead and the wall. Slightly tuck your chin and gently push your forehead into the soft object. Push only with mild to moderate intensity, building up tension gradually. Keep your jaw and forehead relaxed. Hold 10 to 20 seconds. Keep your breathing relaxed. Release the tension slowly. Relax your neck muscles completely before you start the next repetition. Repeat 2-3 times. Complete this exercise 1 time per day.  STRENGTH- Cervical Lateral Flexors, Isometric  Stand about 6 inches away from a wall. Place a small pillow, a ball about 6-8 inches in diameter, or a folded towel between the side of your head and the wall. Slightly tuck your chin and gently tilt your head into the soft object. Push only with mild to moderate intensity, building up tension gradually. Keep your jaw and forehead relaxed. Hold 10 to  20 seconds. Keep your breathing relaxed. Release the tension slowly. Relax your neck muscles completely before you start the next repetition. Repeat 2-3 times. Complete this exercise 1 time per day.  STRENGTH - Cervical Extensors, Isometric  Stand about 6 inches away from a wall. Place a small pillow, a ball about 6-8 inches in diameter, or a folded towel between the back of your head and the wall. Slightly tuck your chin and gently tilt your head back into the soft object. Push only with mild to moderate intensity, building up tension gradually. Keep your jaw and forehead relaxed. Hold 10 to 20 seconds. Keep your breathing relaxed. Release the tension slowly. Relax your neck muscles completely before you start the next repetition. Repeat 2-3 times. Complete this exercise 1 time per day.  POSTURE AND BODY MECHANICS CONSIDERATIONS Keeping correct posture when sitting, standing or completing your activities will reduce the stress put on different body tissues, allowing injured tissues a chance to heal and limiting painful experiences. The following are general guidelines for improved posture. Your physician or physical therapist will provide you with any instructions specific to your needs. While reading these guidelines, remember: The exercises prescribed by your provider will help you have the flexibility and strength to maintain correct postures. The  correct posture provides the optimal environment for your joints to work. All of your joints have less wear and tear when properly supported by a spine with good posture. This means you will experience a healthier, less painful body. Correct posture must be practiced with all of your activities, especially prolonged sitting and standing. Correct posture is as important when doing repetitive low-stress activities (typing) as it is when doing a single heavy-load activity (lifting).  PROLONGED STANDING WHILE SLIGHTLY LEANING FORWARD When completing a  task that requires you to lean forward while standing in one place for a long time, place either foot up on a stationary 2- to 4-inch high object to help maintain the best posture. When both feet are on the ground, the low back tends to lose its slight inward curve. If this curve flattens (or becomes too large), then the back and your other joints will experience too much stress, fatigue more quickly, and can cause pain.   RESTING POSITIONS Consider which positions are most painful for you when choosing a resting position. If you have pain with flexion-based activities (sitting, bending, stooping, squatting), choose a position that allows you to rest in a less flexed posture. You would want to avoid curling into a fetal position on your side. If your pain worsens with extension-based activities (prolonged standing, working overhead), avoid resting in an extended position such as sleeping on your stomach. Most people will find more comfort when they rest with their spine in a more neutral position, neither too rounded nor too arched. Lying on a non-sagging bed on your side with a pillow between your knees, or on your back with a pillow under your knees will often provide some relief. Keep in mind, being in any one position for a prolonged period of time, no matter how correct your posture, can still lead to stiffness.  WALKING Walk with an upright posture. Your ears, shoulders, and hips should all line up. OFFICE WORK When working at a desk, create an environment that supports good, upright posture. Without extra support, muscles fatigue and lead to excessive strain on joints and other tissues.  CHAIR: A chair should be able to slide under your desk when your back makes contact with the back of the chair. This allows you to work closely. The chair's height should allow your eyes to be level with the upper part of your monitor and your hands to be slightly lower than your elbows. Body position: Your feet  should make contact with the floor. If this is not possible, use a foot rest. Keep your ears over your shoulders. This will reduce stress on your neck and low back.

## 2020-09-27 NOTE — Progress Notes (Signed)
Musculoskeletal Exam  Patient: Daniel [name] DOB: 1955/09/17  DOS: 09/27/2020  SUBJECTIVE:  Chief Complaint:   Chief Complaint  Patient presents with   Neck Pain    Daniel [name] is a 65 y.o.  male for evaluation and treatment of neck pain.   Onset:  5 days ago. No inj or change in activity.  Location: back of neck and upper back Character:  dull  Progression of issue:  has slightly improved Associated symptoms: none Denies: Redness, bruising, swelling, decreased ROM Treatment: to date has been OTC NSAIDS, acetaminophen, and opiates.   Neurovascular symptoms: no  Duration: 5 days  Associated symptoms: rhinorrhea and cough Denies: sinus pain, itchy watery eyes, ear pain, ear drainage, sore throat, wheezing, shortness of breath, myalgia, and fevers Treatment to date: Zyrtec Sick contacts: No  2 days ago the patient started straining when he defecates.  Some of his stool is normal and some is hard and small.  He has been having some blood in his stool in addition to swelling around his bottom.  He soaked it yesterday and noticed improvement.  Past Medical History:  Diagnosis Date   Anal fistula    recurrent   Benign localized prostatic hyperplasia with lower urinary tract symptoms (LUTS)    Bilateral lower extremity edema    feet   COPD with emphysema (HCC)    Diverticulosis of colon    ED (erectile dysfunction)    Generalized anxiety disorder    Hemorrhoids    History of colon polyps    History of diabetes mellitus, type II    10-10-2017  per pt no issues after wt loss surgery    History of pneumothorax    1983 & 1988  right spontaneous, treated w/ chest tube   Hyperlipidemia    Hypertension    MDD (major depressive disorder)    OSA (obstructive sleep apnea)    10-10-2017  not used cpap since 2016 after wt loss surgery 2015   Status post biliopancreatic diversion with duodenal switch    07-30-2013   @ [facility] for wt. loss surgery   Vitamin D deficiency    Wears  glasses    Zinc deficiency 04/27/2016    Objective: VITAL SIGNS: BP 108/72   Pulse 71   Temp 98 F (36.7 C) (Oral)   Ht 6\' 1"  (1.854 m)   Wt 188 lb 8 oz (85.5 kg)   SpO2 97%   BMI 24.87 kg/m  Constitutional: Well formed, well developed. No acute distress. Rectum: Large external hemorrhoid noted without thrombosis or bleeding Musculoskeletal: neck.   Normal active range of motion: yes.   Normal passive range of motion: yes Tenderness to palpation: no Deformity: No  Ecchymosis: No Tests positive: none Tests negative: Spurling's Neurologic: Normal sensory function. No focal deficits noted. DTR's equal and symmetric in UE's. No clonus. Heart: RRR Lungs: CTAB. No accessory muscle use HEENT: Ears negative, nares are patent without discharge, pharynx is pink without exudate or erythema Psychiatric: Normal mood. Age appropriate judgment and insight. Alert & oriented x 3.    Assessment:  Neck pain - Plan: meloxicam (MOBIC) 15 MG tablet  Viral URI with cough - Plan: HYDROcodone bit-homatropine (HYCODAN) 5-1.5 MG/5ML syrup  External hemorrhoid - Plan: hydrocortisone 2.5 % cream  Plan: Mobic.  Stretches/exercises, heat, ice, Tylenol.  PT if no better. Supportive care, syrup as above.  Warning signs verbalized and written down. Hydrocortisone cream.  No thrombosis so no thrombectomy needed. F/u as originally scheduled. The  patient voiced understanding and agreement to the plan.   Jilda Roche [place], DO 09/27/20  4:19 PM

## 2020-09-28 ENCOUNTER — Inpatient Hospital Stay: Payer: BC Managed Care – PPO | Attending: Hematology and Oncology

## 2020-09-28 DIAGNOSIS — D509 Iron deficiency anemia, unspecified: Secondary | ICD-10-CM | POA: Diagnosis not present

## 2020-09-28 LAB — CBC WITH DIFFERENTIAL (CANCER CENTER ONLY)
Abs Immature Granulocytes: 0.01 10*3/uL (ref 0.00–0.07)
Basophils Absolute: 0 10*3/uL (ref 0.0–0.1)
Basophils Relative: 0 %
Eosinophils Absolute: 0.3 10*3/uL (ref 0.0–0.5)
Eosinophils Relative: 10 %
HCT: 43.3 % (ref 39.0–52.0)
Hemoglobin: 14.5 g/dL (ref 13.0–17.0)
Immature Granulocytes: 0 %
Lymphocytes Relative: 38 %
Lymphs Abs: 1.2 10*3/uL (ref 0.7–4.0)
MCH: 31.4 pg (ref 26.0–34.0)
MCHC: 33.5 g/dL (ref 30.0–36.0)
MCV: 93.7 fL (ref 80.0–100.0)
Monocytes Absolute: 0.4 10*3/uL (ref 0.1–1.0)
Monocytes Relative: 11 %
Neutro Abs: 1.3 10*3/uL — ABNORMAL LOW (ref 1.7–7.7)
Neutrophils Relative %: 41 %
Platelet Count: 122 10*3/uL — ABNORMAL LOW (ref 150–400)
RBC: 4.62 MIL/uL (ref 4.22–5.81)
RDW: 14.6 % (ref 11.5–15.5)
WBC Count: 3.1 10*3/uL — ABNORMAL LOW (ref 4.0–10.5)
nRBC: 0 % (ref 0.0–0.2)

## 2020-09-28 LAB — IRON AND TIBC
Iron: 107 ug/dL (ref 42–163)
Saturation Ratios: 27 % (ref 20–55)
TIBC: 393 ug/dL (ref 202–409)
UIBC: 286 ug/dL (ref 117–376)

## 2020-09-28 LAB — FERRITIN: Ferritin: 53 ng/mL (ref 24–336)

## 2020-09-28 NOTE — Progress Notes (Signed)
  HEMATOLOGY-ONCOLOGY TELEPHONE VISIT PROGRESS NOTE  I connected with Daniel [name] on 09/29/2020 at 10:30 AM EDT by telephone and verified that I am speaking with the correct person using two identifiers.  I discussed the limitations, risks, security and privacy concerns of performing an evaluation and management service by telephone and the availability of in person appointments.  I also discussed with the patient that there may be a patient responsible charge related to this service. The patient expressed understanding and agreed to proceed.   History of Present Illness: Daniel [name] is a 65 y.o. male with above-mentioned history of IDA. Labs on 09/28/20 showed Hg 14.5, HCT 43.3, MCV 93.7, ferritin 53, Iron sat 27%. She reports via telephone today for follow-up.  He has not noticed any noticeable difference in how he feels.  Currently he denies any fatigue or shortness of breath or any other symptoms.  Observations/Objective:  Feels quite well.   Assessment Plan:  Iron deficiency anemia Lab review 06/15/19: Ferritin 4.9, Iron sat: 7%, Folate 2.5, 03/25/20: Ferritin 7.5, Iron sat: 7.5%, B 12 492, Folate 5.9, Hb 12.8, MCV 90, RDW 15   Status post biliopancreatic diversion with duodenal switch 2015 at [facility]  IV iron: March 2022 Lab review: 09/28/2020: Hemoglobin 14.5, platelets 122, ANC 1.3, ferritin 53 (improved from 7.5), iron saturation 27% (improved from 7.5)  Return to clinic in 6 months with labs done ahead of time and follow-up    I discussed the assessment and treatment plan with the patient. The patient was provided an opportunity to ask questions and all were answered. The patient agreed with the plan and demonstrated an understanding of the instructions. The patient was advised to call back or seek an in-person evaluation if the symptoms worsen or if the condition fails to improve as anticipated.   Total time spent: 12 mins including non-face to face time and time spent for  planning, charting and coordination of care  Daniel Sous, MD 09/29/2020    I, Daniel [name], am acting as scribe for Daniel Croissant, MD.  I have reviewed the above documentation for accuracy and completeness, and I agree with the above.

## 2020-09-29 ENCOUNTER — Inpatient Hospital Stay (HOSPITAL_BASED_OUTPATIENT_CLINIC_OR_DEPARTMENT_OTHER): Payer: BC Managed Care – PPO | Admitting: Hematology and Oncology

## 2020-09-29 DIAGNOSIS — D509 Iron deficiency anemia, unspecified: Secondary | ICD-10-CM

## 2020-09-29 NOTE — Assessment & Plan Note (Signed)
Lab review 06/15/19: Ferritin 4.9, Iron sat: 7%, Folate 2.5, 03/25/20: Ferritin 7.5, Iron sat: 7.5%, B 12 492, Folate 5.9, Hb 12.8, MCV 90, RDW 15  Status post biliopancreatic diversion with duodenal switch 2015 at [facility]  IV iron: March 2022 Lab review: 09/28/2020: Hemoglobin 14.5, platelets 122, ANC 1.3, ferritin 53 (improved from 7.5), iron saturation 27% (improved from 7.5)  Return to clinic in 3 months with labs done ahead of time and follow-up

## 2020-10-03 ENCOUNTER — Telehealth: Payer: Self-pay | Admitting: Hematology and Oncology

## 2020-10-03 NOTE — Telephone Encounter (Signed)
Scheduled per 7/14 los. Called and spoke with pt confirmed 1/18 and 1/19 appts

## 2020-10-05 ENCOUNTER — Other Ambulatory Visit: Payer: Self-pay

## 2020-10-05 ENCOUNTER — Encounter: Payer: Self-pay | Admitting: Family Medicine

## 2020-10-05 ENCOUNTER — Telehealth (INDEPENDENT_AMBULATORY_CARE_PROVIDER_SITE_OTHER): Payer: BC Managed Care – PPO | Admitting: Family Medicine

## 2020-10-05 DIAGNOSIS — U071 COVID-19: Secondary | ICD-10-CM

## 2020-10-05 MED ORDER — MOLNUPIRAVIR EUA 200MG CAPSULE: 4.0000 | ORAL_CAPSULE | Freq: Two times a day (BID) | ORAL | 0 refills | Status: AC

## 2020-10-05 NOTE — Progress Notes (Signed)
Chief Complaint  Patient presents with   Headache   Cough    Congestion Took 2 covid test---negative Loss of taste/smell fatigue    ALMER BUSHEY here for URI complaints. Due to COVID-19 pandemic, we are interacting via telephone. I verified patient's ID using 2 identifiers. Patient agreed to proceed with visit via this method. Patient is at home, I am at office. Patient and I are present for visit.   Duration: 5 days  Associated symptoms: sinus headache, rhinorrhea, myalgia, and N/V, loss of taste-smell, fatigue Denies: sinus congestion, sinus pain, itchy watery eyes, ear pain, ear drainage, sore throat, wheezing, shortness of breath, and fevers Treatment to date: Zyrtec, Theraflu, phenergan DM Sick contacts: No Tested neg for covid x 2.   Past Medical History:  Diagnosis Date   Anal fistula    recurrent   Benign localized prostatic hyperplasia with lower urinary tract symptoms (LUTS)    Bilateral lower extremity edema    feet   COPD with emphysema (HCC)    Diverticulosis of colon    ED (erectile dysfunction)    Generalized anxiety disorder    Hemorrhoids    History of colon polyps    History of diabetes mellitus, type II    10-10-2017  per pt no issues after wt loss surgery    History of pneumothorax    1983 & 1988  right spontaneous, treated w/ chest tube   Hyperlipidemia    Hypertension    MDD (major depressive disorder)    OSA (obstructive sleep apnea)    10-10-2017  not used cpap since 2016 after wt loss surgery 2015   Status post biliopancreatic diversion with duodenal switch    07-30-2013   @ [facility] for wt. loss surgery   Vitamin D deficiency    Wears glasses    Zinc deficiency 04/27/2016    Objective No conversational dyspnea Age appropriate judgment and insight Nml affect and mood  COVID-19 - Plan: molnupiravir EUA 200 mg CAPS  I think he has covid. Will tx as such given his s/s's. OK to consider PCR test, but I cannot think of a malady that would cause  his s/s's other than covid, particularly given his lack of nasal congestion.  Continue to push fluids, practice good hand hygiene, cover mouth when coughing. F/u prn. If starting to experience irreplaceable fluid loss, shaking, or shortness of breath, seek immediate care. Total time: 6 min Pt voiced understanding and agreement to the plan.  Jilda Roche [place], DO 10/05/20 11:50 AM

## 2020-10-13 ENCOUNTER — Other Ambulatory Visit: Payer: Self-pay | Admitting: Family Medicine

## 2020-10-13 DIAGNOSIS — K219 Gastro-esophageal reflux disease without esophagitis: Secondary | ICD-10-CM

## 2020-10-25 ENCOUNTER — Encounter: Payer: Self-pay | Admitting: Family Medicine

## 2020-10-25 ENCOUNTER — Ambulatory Visit: Payer: BC Managed Care – PPO | Admitting: Family Medicine

## 2020-10-25 ENCOUNTER — Other Ambulatory Visit: Payer: Self-pay

## 2020-10-25 VITALS — BP 132/80 | HR 74 | Ht 73.0 in | Wt 188.5 lb

## 2020-10-25 DIAGNOSIS — L0291 Cutaneous abscess, unspecified: Secondary | ICD-10-CM | POA: Diagnosis not present

## 2020-10-25 NOTE — Patient Instructions (Signed)
Do not shower for the rest of the day. When you do wash it, use only soap and water. Keep the area clean and dry.   Things to look out for: increasing pain not relieved by ibuprofen/acetaminophen, fevers, spreading redness, drainage of pus, or foul odor.  Pull packing after 1 week. I will do if you wish. If it falls out on its own, that is OK.   Let us know if you need anything.

## 2020-10-25 NOTE — Progress Notes (Signed)
Chief Complaint  Patient presents with   Procedure    Daniel [name] is a 65 y.o. male here for a skin complaint.  Duration: 1 week Location: L buttock Pruritic? No Painful? Yes Drainage? No New soaps/lotions/topicals/detergents? No Sick contacts? No Other associated symptoms: worsening Therapies tried thus far: amox, doxycycline  Past Medical History:  Diagnosis Date   Anal fistula    recurrent   Benign localized prostatic hyperplasia with lower urinary tract symptoms (LUTS)    Bilateral lower extremity edema    feet   COPD with emphysema (HCC)    Diverticulosis of colon    ED (erectile dysfunction)    Generalized anxiety disorder    Hemorrhoids    History of colon polyps    History of diabetes mellitus, type II    10-10-2017  per pt no issues after wt loss surgery    History of pneumothorax    1983 & 1988  right spontaneous, treated w/ chest tube   Hyperlipidemia    Hypertension    MDD (major depressive disorder)    OSA (obstructive sleep apnea)    10-10-2017  not used cpap since 2016 after wt loss surgery 2015   Status post biliopancreatic diversion with duodenal switch    07-30-2013   @ [facility] for wt. loss surgery   Vitamin D deficiency    Wears glasses    Zinc deficiency 04/27/2016    BP 132/80   Pulse 74   Ht 6\' 1"  (1.854 m)   Wt 188 lb 8 oz (85.5 kg)   SpO2 96%   BMI 24.87 kg/m  Gen: awake, alert, appearing stated age Lungs: No accessory muscle use Skin: R butt check w erythema, induration and central fluctuance. There is + warmth and ttp. No drainage, excoriation Psych: Age appropriate judgment and insight  Procedure note; incision and drainage Informed consent obtained. The area was cleaned with alcohol The area was anesthetized with 5 mL of 1% lidocaine with epinephrine. Once adequate anesthesia was obtained, a cruciate incision was made with 11 blade scalpel. Approximately 10 mL of purulent material with blood was expressed. Loculations were  interrupted with a curved hemostat. The area was packed with approximately 4 cm of 0.25 in iodoform gauze. The area was then dressed with gauze. There were no complications noted. The patient tolerated the procedure well.  Abscess - Plan: PR DRAIN SKIN ABSCESS SIMPLE  I&D today. Aftercare instruction verbalized and written down. Warning signs and symptoms verbalized and written down in AVS. Cont doxy for 5 more d. Pull packing in 1 week.  The patient voiced understanding and agreement to the plan.  [place], DO 10/25/20 2:45 PM

## 2020-11-06 ENCOUNTER — Other Ambulatory Visit: Payer: Self-pay | Admitting: Family Medicine

## 2020-11-06 DIAGNOSIS — R000 Tachycardia, unspecified: Secondary | ICD-10-CM

## 2020-11-07 ENCOUNTER — Other Ambulatory Visit: Payer: Self-pay | Admitting: Family Medicine

## 2020-11-07 ENCOUNTER — Telehealth: Payer: Self-pay | Admitting: Family Medicine

## 2020-11-07 NOTE — Telephone Encounter (Signed)
Advise if any recent changes

## 2020-11-07 NOTE — Telephone Encounter (Signed)
The pharmacist stated that today Metoprolol was sent in and that on 6/22 nebivolol 10 mg bid was sent in.  Pharmacist said that usually patients are not on both?

## 2020-11-07 NOTE — Telephone Encounter (Signed)
Pharmacist informed of PCP instructions. 

## 2020-11-07 NOTE — Telephone Encounter (Signed)
Keep Toprol, should not be nebivolol. Ty.

## 2020-11-07 NOTE — Telephone Encounter (Signed)
Daniel [name] with [facility] calling for clarification on Metoprolol RX that was sent in today. Pt was called and said he was unaware of any changes in medications. Please call pharmacy.

## 2020-11-11 ENCOUNTER — Ambulatory Visit: Payer: BC Managed Care – PPO | Admitting: Family Medicine

## 2020-11-11 ENCOUNTER — Encounter: Payer: Self-pay | Admitting: Family Medicine

## 2020-11-11 ENCOUNTER — Other Ambulatory Visit: Payer: Self-pay

## 2020-11-11 VITALS — BP 114/68 | HR 69 | Temp 98.3°F | Ht 73.0 in | Wt 190.0 lb

## 2020-11-11 DIAGNOSIS — I10 Essential (primary) hypertension: Secondary | ICD-10-CM | POA: Diagnosis not present

## 2020-11-11 DIAGNOSIS — Z23 Encounter for immunization: Secondary | ICD-10-CM | POA: Diagnosis not present

## 2020-11-11 DIAGNOSIS — R600 Localized edema: Secondary | ICD-10-CM

## 2020-11-11 MED ORDER — FUROSEMIDE 20 MG PO TABS: ORAL_TABLET | ORAL | 1 refills | Status: DC

## 2020-11-11 MED ORDER — NEBIVOLOL HCL 10 MG PO TABS
10.0000 mg | ORAL_TABLET | Freq: Two times a day (BID) | ORAL | 3 refills | Status: DC
Start: 2020-11-11 — End: 2021-11-30

## 2020-11-11 MED ORDER — NEBIVOLOL HCL 5 MG PO TABS: 5.0000 mg | ORAL_TABLET | Freq: Two times a day (BID) | ORAL | 3 refills | Status: DC

## 2020-11-11 NOTE — Progress Notes (Signed)
Chief Complaint  Patient presents with   Follow-up    Medication He takes generic Bystolic Needs prescription Generic Bystolic. Does not take metoprolol    [name] here for bilateral leg swelling.  Duration: 1  week Hx of prolonged bedrest, recent surgery, travel or injury? No Pain the calf? No SOB? No Personal or family history of clot or bleeding disorder? No Hx of heart failure, renal failure, hepatic failure? No No changes with his diet.  He reports that his pharmacy keeps bothering him to get the Shingrix vaccine.  He would like to know more information about this.  Hypertension Patient presents for hypertension follow up. He does not monitor home blood pressures. He is compliant with medications-Bystolic 10 mg twice daily, lisinopril 2.5 mg daily,. Patient has these side effects of medication: none He is usually adhering to a healthy diet overall. Exercise: Some walking  Past Medical History:  Diagnosis Date   Anal fistula    recurrent   Benign localized prostatic hyperplasia with lower urinary tract symptoms (LUTS)    Bilateral lower extremity edema    feet   COPD with emphysema (HCC)    Diverticulosis of colon    ED (erectile dysfunction)    Generalized anxiety disorder    Hemorrhoids    History of colon polyps    History of diabetes mellitus, type II    10-10-2017  per pt no issues after wt loss surgery    History of pneumothorax    1983 & 1988  right spontaneous, treated w/ chest tube   Hyperlipidemia    Hypertension    MDD (major depressive disorder)    OSA (obstructive sleep apnea)    10-10-2017  not used cpap since 2016 after wt loss surgery 2015   Status post biliopancreatic diversion with duodenal switch    07-30-2013   @ [facility] for wt. loss surgery   Vitamin D deficiency    Wears glasses    Zinc deficiency 04/27/2016   Family History  Problem Relation Age of Onset   Cancer Mother        ovarian   Hyperlipidemia Father    Hypertension  Father    Heart disease Father        MI 7/19   Emphysema Father    Pulmonary disease Father    Heart disease Maternal Grandmother    Heart disease Maternal Grandfather    Stroke Maternal Grandfather    Heart disease Paternal Grandmother    Angina Paternal Grandmother    Heart disease Paternal Grandfather    Stroke Paternal Grandfather    Diabetes Maternal Uncle    Kidney disease Maternal Uncle    Diabetes Paternal Uncle    Cancer Paternal Uncle    Neuropathy Neg Hx    Past Surgical History:  Procedure Laterality Date   ANAL FISTULOTOMY N/A 08/07/2016   Procedure: ANAL FISTULOTOMY;  Surgeon: Abigail Miyamoto, MD;  Location: [facility] OR;  Service: General;  Laterality: N/A;   CHEST TUBE INSERTION  1988   right lung for spontaneous pneumothorax   COLONOSCOPY W/ BIOPSIES  08/02/2016   EXOSTECTECTOMY TOE Left 04/25/2017   Procedure: TARSAL EXOSTECTOMY OF MEDIAL CUNEIFORM AND EXOSTECTOMY OF THE FIRST METATARSAL;  Surgeon: Felecia Shelling, DPM;  Location: [facility] OR;  Service: Podiatry;  Laterality: Left;   GASTROPLASTY DUODENAL SWITCH  07-30-2013    @[facility]    "wt loss surgery not gastric bypass surgery" (biliopancreatic diversion w/ duodenal switch)   HEMORRHOID SURGERY N/A 08/07/2016  Procedure: HEMORRHOIDECTOMY;  Surgeon: Abigail Miyamoto, MD;  Location: [facility];  Service: General;  Laterality: N/A;   INCISION AND DRAINAGE ABSCESS N/A 10/14/2017   Procedure: POSSIBLE INCISION AND DRAINAGE OF ANAL ABSCESS;  Surgeon: Andria Meuse, MD;  Location: [facility];  Service: General;  Laterality: N/A;   INGUINAL HERNIA REPAIR Left 04/10/2017   Procedure: LAPAROSCOPIC LEFT INGUINAL HERNIA REPAIR WITH MESH;  Surgeon: Abigail Miyamoto, MD;  Location: [facility];  Service: General;  Laterality: Left;   INSERTION OF MESH Left 04/10/2017   Procedure: INSERTION OF MESH;  Surgeon: Abigail Miyamoto, MD;  Location: [facility];  Service: General;  Laterality: Left;    IRRIGATION AND DEBRIDEMENT SEBACEOUS CYST     back   MASS EXCISION N/A 01/29/2018   Procedure: EXCISION of lesion on scalp;  Surgeon: Peggye Form, DO;  Location: [facility];  Service: Plastics;  Laterality: N/A;   RECTAL EXAM UNDER ANESTHESIA N/A 10/14/2017   Procedure: ANAL EXAM UNDER ANESTHESIA, INJECTION OF FIBRIN GLUE;  Surgeon: Andria Meuse, MD;  Location: [facility];  Service: General;  Laterality: N/A;    Current Outpatient Medications:    albuterol (VENTOLIN HFA) 108 (90 Base) MCG/ACT inhaler, Take 2 puffs 10-15 minutes prior to exercise., Disp: 18 g, Rfl: 2   amphetamine-dextroamphetamine (ADDERALL) 20 MG tablet, Take 1 tablet (20 mg total) by mouth 2 (two) times daily., Disp: 60 tablet, Rfl: 0   buPROPion (WELLBUTRIN XL) 150 MG 24 hr tablet, TAKE ONE TABLET BY MOUTH THREE TIMES A DAY, Disp: 270 tablet, Rfl: 1   Calcium Carb-Cholecalciferol 600-500 MG-UNIT CAPS, Take 1 tablet by mouth daily., Disp: , Rfl:    Cholecalciferol (VITAMIN D3) 1000 units CAPS, Take 1,000 Units by mouth daily., Disp: , Rfl:    clobetasol (TEMOVATE) 0.05 % external solution, APPLY TO AFFECTED AREA(S) DAILY ON SCALP AS DIRECTED AS NEEDED FOR ITCHING, Disp: 50 mL, Rfl: 1   diazepam (VALIUM) 5 MG tablet, TAKE 1 TO 2 TABLETS BY MOUTH EVERY 8 HOURS AS NEEDED FOR ANXIETY, Disp: 90 tablet, Rfl: 2   DULoxetine (CYMBALTA) 30 MG capsule, TAKE 3 CAPSULES BY MOUTH EVERY DAY, Disp: 270 capsule, Rfl: 2   finasteride (PROPECIA) 1 MG tablet, TAKE ONE TABLET BY MOUTH DAILY, Disp: 30 tablet, Rfl: 1   folic acid (FOLVITE) 400 MCG tablet, , Disp: , Rfl:    HYDROcodone bit-homatropine (HYCODAN) 5-1.5 MG/5ML syrup, Take 5 mLs by mouth every 6 (six) hours as needed for cough., Disp: 120 mL, Rfl: 0   hydrocortisone 2.5 % cream, Apply topically 2 (two) times daily., Disp: 30 g, Rfl: 0   lisinopril (ZESTRIL) 2.5 MG tablet, Take 1 tablet (2.5 mg total) by mouth daily., Disp: 90 tablet, Rfl:  1   meloxicam (MOBIC) 15 MG tablet, Take 1 tablet (15 mg total) by mouth daily., Disp: 30 tablet, Rfl: 0   Multiple Vitamins-Minerals (CENTRUM SILVER) tablet, Take 2 tablets by mouth daily., Disp: , Rfl:    Omega-3 300 MG CAPS, Take 300 mg by mouth daily., Disp: , Rfl:    pantoprazole (PROTONIX) 40 MG tablet, TAKE 1 TABLET BY MOUTH DAILY, Disp: 30 tablet, Rfl: 1   pregabalin (LYRICA) 75 MG capsule, Take 1 capsule (75 mg total) by mouth at bedtime as needed (RLS)., Disp: 30 capsule, Rfl: 2   rOPINIRole (REQUIP) 1 MG tablet, TAKE TWO TABLETS BY MOUTH EVERY NIGHT AT BEDTIME, Disp: 60 tablet, Rfl: 3   sildenafil (  VIAGRA) 100 MG tablet, TAKE ONE TABLET BY MOUTH DAILY AS NEEDED FOR FOR ERECTILE DYSFUNCTION, Disp: 30 tablet, Rfl: 7   tamsulosin (FLOMAX) 0.4 MG CAPS capsule, Take 1 capsule (0.4 mg total) by mouth daily after supper., Disp: 90 capsule, Rfl: 1   temazepam (RESTORIL) 15 MG capsule, TAKE 1 CAPSULE(S) BY MOUTH EVERY NIGHT AT BEDTIME AS NEEDED FOR SLEEP, Disp: 30 capsule, Rfl: 5   testosterone cypionate (DEPO-TESTOSTERONE) 200 MG/ML injection, Inject 1 mL (200 mg total) into the muscle every 14 (fourteen) days., Disp: 10 mL, Rfl: 0   VITAMIN A PO, Take 2,400 Units by mouth daily., Disp: , Rfl:    Vitamin D, Ergocalciferol, (DRISDOL) 1.25 MG (50000 UNIT) CAPS capsule, TAKE ONE CAPSULE BY MOUTH EVERY 7 DAYS, Disp: 12 capsule, Rfl: 1   vitamin E 200 UNIT capsule, Take 200 Units by mouth daily., Disp: , Rfl:    vitamin k 100 MCG tablet, , Disp: , Rfl:    Zinc 50 MG CAPS, Take 1 capsule by mouth daily., Disp: , Rfl:    zinc gluconate 50 MG tablet, Take 100 mg by mouth daily., Disp: , Rfl:    furosemide (LASIX) 20 MG tablet, One by mouth once a day as needed for swelling, Disp: 30 tablet, Rfl: 1   nebivolol (BYSTOLIC) 10 MG tablet, Take 1 tablet (10 mg total) by mouth 2 (two) times daily., Disp: 180 tablet, Rfl: 3  BP 114/68   Pulse 69   Temp 98.3 F (36.8 C) (Oral)   Ht 6\' 1"  (1.854 m)   Wt  190 lb (86.2 kg)   SpO2 94%   BMI 25.07 kg/m  Gen- awake, alert, appears stated age Heart- RRR, no murmurs, no bruits, trace pitting lower extremity edema near the ankles bilaterally Lungs- CTAB, normal effort w/o accessory muscle use MSK- no calf pain Psych: Age appropriate judgment and insight  Need for shingles vaccine - Plan: Varicella-zoster vaccine IM (Shingrix)  Chronic, exacerbation.  Restart Lasix for short period of time.  He rarely takes a longer than several days so doubt he needs potassium with this.  Elevate legs, exercise, mind salt intake, consider compression stockings. 2.   Chronic, stable. Cont Bystolic 10 mg bid, lisinopril 2.5 mg/d. Counseled on diet/exercise.  3.   1st Shingrix today, 2nd in 2 mo.  F/u in 5 mo for CPE or prn. Pt voiced understanding and agreement to the plan.  [place], DO 11/11/20  3:56 PM

## 2020-11-11 NOTE — Patient Instructions (Addendum)
Let me know if you are taking Lasix for more than 7 days at a time.   For the swelling in your lower extremities, be sure to elevate your legs when able, mind the salt intake, stay physically active and consider wearing compression stockings.  Let us know if you need anything.

## 2020-11-23 ENCOUNTER — Ambulatory Visit: Payer: BC Managed Care – PPO

## 2020-11-28 ENCOUNTER — Other Ambulatory Visit: Payer: Self-pay | Admitting: Family Medicine

## 2020-11-28 NOTE — Telephone Encounter (Signed)
Last OV---11/11/2020 Last RF--07/21/2018---#30 with 5 refills

## 2020-12-07 ENCOUNTER — Ambulatory Visit: Payer: BC Managed Care – PPO | Admitting: Podiatry

## 2020-12-08 ENCOUNTER — Ambulatory Visit: Payer: BC Managed Care – PPO | Admitting: Podiatry

## 2020-12-08 ENCOUNTER — Encounter: Payer: Self-pay | Admitting: Podiatry

## 2020-12-08 ENCOUNTER — Other Ambulatory Visit: Payer: Self-pay

## 2020-12-08 ENCOUNTER — Ambulatory Visit (INDEPENDENT_AMBULATORY_CARE_PROVIDER_SITE_OTHER): Payer: BC Managed Care – PPO

## 2020-12-08 DIAGNOSIS — S92425A Nondisplaced fracture of distal phalanx of left great toe, initial encounter for closed fracture: Secondary | ICD-10-CM | POA: Diagnosis not present

## 2020-12-08 DIAGNOSIS — S92912A Unspecified fracture of left toe(s), initial encounter for closed fracture: Secondary | ICD-10-CM | POA: Diagnosis not present

## 2020-12-08 DIAGNOSIS — M79672 Pain in left foot: Secondary | ICD-10-CM

## 2020-12-08 NOTE — Progress Notes (Signed)
Subjective:   Patient ID: Daniel [name], male   DOB: 65 y.o.   MRN: [id number]   HPI Patient presents stating his had a lot of pain in his left foot and it has been swollen and making it hard to walk.  He is concerned he may have a fracture after injury 2 weeks ago today   ROS      Objective:  Physical Exam  Neurovascular status intact with inflammation pain around the fifth metatarsal base of fifth digit left with fluid buildup around the area.     Assessment:  Possibility for fracture of the left digit or metatarsal bone left     Plan:  H&P reviewed condition and x-ray and advised him on rigid bottom shoes wide shoes and that this will take 8 to 12 weeks to completely heal.  If symptoms still persistent in 8 weeks reappoint for rex-ray  X-rays indicate that there is a fracture of the left base of the fifth toe medial side with moderate displacement but hopefully should heal well but does have joint involvement and may ultimately require removal of bone spur

## 2020-12-13 ENCOUNTER — Other Ambulatory Visit: Payer: Self-pay | Admitting: Podiatry

## 2020-12-13 DIAGNOSIS — S92425A Nondisplaced fracture of distal phalanx of left great toe, initial encounter for closed fracture: Secondary | ICD-10-CM

## 2021-01-01 ENCOUNTER — Other Ambulatory Visit: Payer: Self-pay | Admitting: Family Medicine

## 2021-01-01 DIAGNOSIS — L649 Androgenic alopecia, unspecified: Secondary | ICD-10-CM

## 2021-01-02 DIAGNOSIS — E291 Testicular hypofunction: Secondary | ICD-10-CM | POA: Diagnosis not present

## 2021-01-11 ENCOUNTER — Ambulatory Visit (INDEPENDENT_AMBULATORY_CARE_PROVIDER_SITE_OTHER): Payer: BC Managed Care – PPO

## 2021-01-11 ENCOUNTER — Other Ambulatory Visit: Payer: Self-pay

## 2021-01-11 DIAGNOSIS — Z23 Encounter for immunization: Secondary | ICD-10-CM

## 2021-01-11 NOTE — Progress Notes (Signed)
Pt here to receive 2nd Shingles vaccine per [place]. Vaccine given in right deltoid. Pt handled well. Pt refused vaccine info sheet.

## 2021-01-14 ENCOUNTER — Other Ambulatory Visit: Payer: Self-pay | Admitting: Family Medicine

## 2021-01-17 ENCOUNTER — Other Ambulatory Visit: Payer: Self-pay | Admitting: Family Medicine

## 2021-01-17 MED ORDER — ROPINIROLE HCL 1 MG PO TABS: ORAL_TABLET | ORAL | 3 refills | Status: DC

## 2021-01-25 ENCOUNTER — Other Ambulatory Visit: Payer: Self-pay | Admitting: Family Medicine

## 2021-01-25 DIAGNOSIS — K219 Gastro-esophageal reflux disease without esophagitis: Secondary | ICD-10-CM

## 2021-01-26 ENCOUNTER — Other Ambulatory Visit: Payer: Self-pay

## 2021-01-26 ENCOUNTER — Ambulatory Visit (INDEPENDENT_AMBULATORY_CARE_PROVIDER_SITE_OTHER)
Admission: RE | Admit: 2021-01-26 | Discharge: 2021-01-26 | Disposition: A | Discharge status: Home / Self Care | Payer: BC Managed Care – PPO | Source: Ambulatory Visit | Attending: Cardiology | Admitting: Cardiology

## 2021-01-26 DIAGNOSIS — F17210 Nicotine dependence, cigarettes, uncomplicated: Secondary | ICD-10-CM | POA: Diagnosis not present

## 2021-01-26 DIAGNOSIS — Z87891 Personal history of nicotine dependence: Secondary | ICD-10-CM | POA: Diagnosis not present

## 2021-01-30 DIAGNOSIS — F330 Major depressive disorder, recurrent, mild: Secondary | ICD-10-CM | POA: Diagnosis not present

## 2021-01-30 DIAGNOSIS — F411 Generalized anxiety disorder: Secondary | ICD-10-CM | POA: Diagnosis not present

## 2021-02-02 ENCOUNTER — Other Ambulatory Visit: Payer: Self-pay | Admitting: Family Medicine

## 2021-02-02 DIAGNOSIS — N529 Male erectile dysfunction, unspecified: Secondary | ICD-10-CM

## 2021-02-06 ENCOUNTER — Other Ambulatory Visit: Payer: Self-pay | Admitting: Acute Care

## 2021-02-06 DIAGNOSIS — F17210 Nicotine dependence, cigarettes, uncomplicated: Secondary | ICD-10-CM

## 2021-02-06 DIAGNOSIS — Z87891 Personal history of nicotine dependence: Secondary | ICD-10-CM

## 2021-02-07 DIAGNOSIS — L708 Other acne: Secondary | ICD-10-CM | POA: Diagnosis not present

## 2021-02-07 DIAGNOSIS — L281 Prurigo nodularis: Secondary | ICD-10-CM | POA: Diagnosis not present

## 2021-02-24 ENCOUNTER — Other Ambulatory Visit: Payer: Self-pay | Admitting: Family Medicine

## 2021-02-24 DIAGNOSIS — G4700 Insomnia, unspecified: Secondary | ICD-10-CM

## 2021-03-25 ENCOUNTER — Other Ambulatory Visit: Payer: Self-pay | Admitting: Family Medicine

## 2021-03-25 DIAGNOSIS — J439 Emphysema, unspecified: Secondary | ICD-10-CM

## 2021-03-27 DIAGNOSIS — E291 Testicular hypofunction: Secondary | ICD-10-CM | POA: Diagnosis not present

## 2021-03-31 ENCOUNTER — Other Ambulatory Visit: Payer: Self-pay | Admitting: Family Medicine

## 2021-03-31 DIAGNOSIS — E291 Testicular hypofunction: Secondary | ICD-10-CM | POA: Diagnosis not present

## 2021-04-04 ENCOUNTER — Other Ambulatory Visit: Payer: Self-pay | Admitting: *Deleted

## 2021-04-04 DIAGNOSIS — D509 Iron deficiency anemia, unspecified: Secondary | ICD-10-CM

## 2021-04-05 ENCOUNTER — Other Ambulatory Visit: Payer: Self-pay

## 2021-04-05 ENCOUNTER — Inpatient Hospital Stay: Payer: BC Managed Care – PPO | Attending: Hematology and Oncology

## 2021-04-05 DIAGNOSIS — D509 Iron deficiency anemia, unspecified: Secondary | ICD-10-CM | POA: Insufficient documentation

## 2021-04-05 LAB — CBC WITH DIFFERENTIAL (CANCER CENTER ONLY)
Abs Immature Granulocytes: 0.01 10*3/uL (ref 0.00–0.07)
Basophils Absolute: 0.1 10*3/uL (ref 0.0–0.1)
Basophils Relative: 1 %
Eosinophils Absolute: 0.4 10*3/uL (ref 0.0–0.5)
Eosinophils Relative: 7 %
HCT: 45.8 % (ref 39.0–52.0)
Hemoglobin: 15 g/dL (ref 13.0–17.0)
Immature Granulocytes: 0 %
Lymphocytes Relative: 24 %
Lymphs Abs: 1.3 10*3/uL (ref 0.7–4.0)
MCH: 31.1 pg (ref 26.0–34.0)
MCHC: 32.8 g/dL (ref 30.0–36.0)
MCV: 95 fL (ref 80.0–100.0)
Monocytes Absolute: 0.5 10*3/uL (ref 0.1–1.0)
Monocytes Relative: 10 %
Neutro Abs: 3.1 10*3/uL (ref 1.7–7.7)
Neutrophils Relative %: 58 %
Platelet Count: 207 10*3/uL (ref 150–400)
RBC: 4.82 MIL/uL (ref 4.22–5.81)
RDW: 13.7 % (ref 11.5–15.5)
WBC Count: 5.4 10*3/uL (ref 4.0–10.5)
nRBC: 0 % (ref 0.0–0.2)

## 2021-04-05 LAB — IRON AND IRON BINDING CAPACITY (CC-WL,HP ONLY)
Iron: 83 ug/dL (ref 45–182)
Saturation Ratios: 16 % — ABNORMAL LOW (ref 17.9–39.5)
TIBC: 521 ug/dL — ABNORMAL HIGH (ref 250–450)
UIBC: 438 ug/dL — ABNORMAL HIGH (ref 117–376)

## 2021-04-05 LAB — FERRITIN: Ferritin: 9 ng/mL — ABNORMAL LOW (ref 24–336)

## 2021-04-05 NOTE — Assessment & Plan Note (Signed)
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  IV iron: March 2022 Lab review: 09/28/2020: Hemoglobin 14.5, platelets 122, ANC 1.3, ferritin 53 (improved from 7.5), iron saturation 27% (improved from 7.5)  Return to clinic in 6 months with labs done ahead of time and follow-up

## 2021-04-05 NOTE — Progress Notes (Signed)
°  HEMATOLOGY-ONCOLOGY TELEPHONE VISIT PROGRESS NOTE  I connected with [name] on 04/06/2021 at 10:30 AM EST by telephone and verified that I am speaking with the correct person using two identifiers.  I discussed the limitations, risks, security and privacy concerns of performing an evaluation and management service by telephone and the availability of in person appointments.  I also discussed with the patient that there may be a patient responsible charge related to this service. The patient expressed understanding and agreed to proceed.   History of Present Illness: Daniel [name] is a 66 y.o. male with above-mentioned history of IDA. Labs on 04/05/2020 showed Hg 15.0, HCT 45.8, MCV 95.0, ferritin 9, Iron sat 16%. She reports via telephone today for follow-up.   Observations/Objective: Patient has ferritin of 9 but a hemoglobin of 15    Assessment Plan:  Iron deficiency anemia 06/15/19: Ferritin 4.9, Iron sat: 7%, Folate 2.5, 03/25/20: Ferritin 7.5, Iron sat: 7.5%, B 12 492, Folate 5.9, Hb 12.8, MCV 90, RDW 15 09/28/2020: Hemoglobin 14.5, platelets 122, ANC 1.3, ferritin 53 (improved from 7.5), iron saturation 27% (improved from 7.5) 04/05/2021: Ferritin 9, iron saturation 16%, hemoglobin 15  Status post biliopancreatic diversion with duodenal switch 2015 at [facility]   IV iron: March 2022  I discussed with the patient that his hemoglobin is excellent but his ferritin levels are low. We decided to watch and monitor with another recheck of his labs in 3 months. Return to clinic in 3 months with labs done ahead of time and follow-up by telephone visit   I discussed the assessment and treatment plan with the patient. The patient was provided an opportunity to ask questions and all were answered. The patient agreed with the plan and demonstrated an understanding of the instructions. The patient was advised to call back or seek an in-person evaluation if the symptoms worsen or if the condition  fails to improve as anticipated.   Total time spent: 12 mins including non-face to face time and time spent for planning, charting and coordination of care  Sabas Sous, MD 04/06/2021    I, Alda Ponder, am acting as scribe for Serena Croissant, MD.  I have reviewed the above documentation for accuracy and completeness, and I agree with the above.

## 2021-04-06 ENCOUNTER — Inpatient Hospital Stay (HOSPITAL_BASED_OUTPATIENT_CLINIC_OR_DEPARTMENT_OTHER): Payer: BC Managed Care – PPO | Admitting: Hematology and Oncology

## 2021-04-06 DIAGNOSIS — D509 Iron deficiency anemia, unspecified: Secondary | ICD-10-CM | POA: Diagnosis not present

## 2021-04-12 ENCOUNTER — Other Ambulatory Visit: Payer: Self-pay | Admitting: Family Medicine

## 2021-04-19 ENCOUNTER — Other Ambulatory Visit: Payer: Self-pay | Admitting: Family Medicine

## 2021-04-19 ENCOUNTER — Encounter: Payer: Self-pay | Admitting: Hematology and Oncology

## 2021-04-19 DIAGNOSIS — L649 Androgenic alopecia, unspecified: Secondary | ICD-10-CM

## 2021-04-24 ENCOUNTER — Encounter: Payer: Self-pay | Admitting: Hematology and Oncology

## 2021-04-24 ENCOUNTER — Ambulatory Visit: Payer: Medicare Other | Admitting: Podiatry

## 2021-04-24 ENCOUNTER — Other Ambulatory Visit: Payer: Self-pay | Admitting: Family Medicine

## 2021-04-24 ENCOUNTER — Telehealth: Payer: Self-pay | Admitting: Family Medicine

## 2021-04-24 ENCOUNTER — Other Ambulatory Visit: Payer: Self-pay

## 2021-04-24 DIAGNOSIS — L989 Disorder of the skin and subcutaneous tissue, unspecified: Secondary | ICD-10-CM

## 2021-04-24 MED ORDER — DIAZEPAM 5 MG PO TABS: ORAL_TABLET | ORAL | 2 refills | Status: DC

## 2021-04-24 NOTE — Telephone Encounter (Signed)
Called informed the patient of PCP instructions. He verbalized understanding///will call back to schedule that appt.

## 2021-04-24 NOTE — Progress Notes (Signed)
° °  Subjective: 66 y.o. male presenting to the office today for evaluation of symptomatic calluses to the plantar aspect of the fifth MTP joint bilateral.  He says that recently he was in [place], [place] when he had to walk approximately 1 mile in dress shoes.  Since then he has had significant pain and tenderness associated to the forefoot.  He presents for further treatment and evaluation  Past Medical History:  Diagnosis Date   Anal fistula    recurrent   Benign localized prostatic hyperplasia with lower urinary tract symptoms (LUTS)    Bilateral lower extremity edema    feet   COPD with emphysema ([place])    Diverticulosis of colon    ED (erectile dysfunction)    Generalized anxiety disorder    Hemorrhoids    History of colon polyps    History of diabetes mellitus, type II    10-10-2017  per pt no issues after wt loss surgery    History of pneumothorax    1983 & 1988  right spontaneous, treated w/ chest tube   Hyperlipidemia    Hypertension    MDD (major depressive disorder)    OSA (obstructive sleep apnea)    10-10-2017  not used cpap since 2016 after wt loss surgery 2015   Status post biliopancreatic diversion with duodenal switch    07-30-2013   @ [facility] for wt. loss surgery   Vitamin D deficiency    Wears glasses    Zinc deficiency 04/27/2016     Objective:  Physical Exam General: Alert and oriented x3 in no acute distress  Dermatology: Hyperkeratotic lesion(s) present on the subfifth MTPJ bilateral right greater than the left. Pain on palpation with a central nucleated core noted. Skin is warm, dry and supple bilateral lower extremities. Negative for open lesions or macerations.  Vascular: Palpable pedal pulses bilaterally. No edema or erythema noted. Capillary refill within normal limits.  Neurological: Epicritic and protective threshold grossly intact bilaterally.   Musculoskeletal Exam: Associated pain on palpation at the keratotic lesion(s) noted. Range of motion  within normal limits bilateral. Muscle strength 5/5 in all groups bilateral.  Assessment: 1.  Preulcerative callus lesions bilateral fifth MTPJ right greater than left 2.  Pain in both feet   Plan of Care:  1. Patient evaluated 2. Excisional debridement of keratoic lesion(s) using a chisel blade was performed without incident.  3.  Recommend pumice stone for a light debrider to keep the calluses shaved down  4. Patient is to return to the clinic PRN.   Edrick Kins, DPM [facility]  Dr. Edrick Kins, DPM    [address], [place] [address]                Office [phone]  Fax [phone]

## 2021-04-24 NOTE — Telephone Encounter (Signed)
Refill request for Diazepam 5 mg tablet Last OV--11/11/2020 Last RF--09/20/2020  #90 with 2 refills.

## 2021-04-26 ENCOUNTER — Ambulatory Visit: Payer: BC Managed Care – PPO | Admitting: Podiatry

## 2021-05-01 ENCOUNTER — Other Ambulatory Visit: Payer: Self-pay | Admitting: Family Medicine

## 2021-05-01 DIAGNOSIS — K219 Gastro-esophageal reflux disease without esophagitis: Secondary | ICD-10-CM

## 2021-06-06 ENCOUNTER — Other Ambulatory Visit: Payer: Self-pay | Admitting: Family Medicine

## 2021-06-07 NOTE — Telephone Encounter (Signed)
Requesting: Ambien 10mg   ?Contract: None ?UDS: None ?Last Visit: 11/11/2020 ?Next Visit: None ?Last Refill: 11/28/2020 #30 and 5RF  ? ?Please Advise ? ?

## 2021-06-19 ENCOUNTER — Encounter: Payer: Self-pay | Admitting: Hematology and Oncology

## 2021-06-21 DIAGNOSIS — L089 Local infection of the skin and subcutaneous tissue, unspecified: Secondary | ICD-10-CM | POA: Diagnosis not present

## 2021-06-21 DIAGNOSIS — L720 Epidermal cyst: Secondary | ICD-10-CM | POA: Diagnosis not present

## 2021-06-22 NOTE — Progress Notes (Signed)
HEMATOLOGY-ONCOLOGY TELEPHONE VISIT PROGRESS NOTE [name] connected with @PTNAME @ on 07/06/21 at 10:30 AM EDT by telephone and verified that I am speaking with the correct person using two identifiers.  ?I discussed the limitations, risks, security and privacy concerns of performing an evaluation and management service by telephone and the availability of in person appointments.  ?I also discussed with the patient that there may be a patient responsible charge related to this service. The patient expressed understanding and agreed to proceed.  ? ?History of Present Illness: Daniel [name] is a 66 y.o. male with above-mentioned history of IDA. She reports via telephone today for follow-up.  He reports no new problems or concerns.  He had blood work yesterday and we are connecting with him by telephone to discuss the results and come up with a treatment plan. ?  ? ?REVIEW OF SYSTEMS:   ?Constitutional: Denies fevers, chills or abnormal weight loss ?All other systems were reviewed with the patient and are negative. ? ?Assessment Plan:  ?Iron deficiency anemia ?06/15/19: Ferritin 4.9, Iron sat: 7%, Folate 2.5, ?03/25/20: Ferritin 7.5, Iron sat: 7.5%, B 12 492, Folate 5.9, Hb 12.8, MCV 90, RDW 15 ?09/28/2020: Hemoglobin 14.5, platelets 122, ANC 1.3, ferritin 53 (improved from 7.5), iron saturation 27% (improved from 7.5) ?04/05/2021: Ferritin 9, iron saturation 16%, hemoglobin 15  ?07/05/2021: Hemoglobin 15.1, MCV 93.6, platelets 202, iron saturation 20%, TIBC 414, ferritin 16 ?  ?Status post biliopancreatic diversion with duodenal switch 2015 at [facility] ?  ?IV iron: March 2022 ? ?Based on the above lab work we can continue to watch and monitor iron studies and CBC ?Recheck labs in 6 months and follow-up after that with a telephone visit ? ? [name] discussed the assessment and treatment plan with the patient. The patient was provided an opportunity to ask questions and all were answered. The patient agreed with the plan and  demonstrated an understanding of the instructions. The patient was advised to call back or seek an in-person evaluation if the symptoms worsen or if the condition fails to improve as anticipated.  [name] provided 12 minutes of non-face-to-face time during this encounter. April 2022, MD   ?Tamsen Meek am scribing for Dr. Audry Riles [name] have reviewed the above documentation for accuracy and completeness, and I agree with the above. ?. ?

## 2021-06-26 ENCOUNTER — Ambulatory Visit (INDEPENDENT_AMBULATORY_CARE_PROVIDER_SITE_OTHER): Payer: Medicare Other | Admitting: Family Medicine

## 2021-06-26 ENCOUNTER — Encounter: Payer: Self-pay | Admitting: Family Medicine

## 2021-06-26 VITALS — BP 108/64 | HR 72 | Temp 98.0°F | Ht 73.0 in | Wt 188.5 lb

## 2021-06-26 DIAGNOSIS — E60 Dietary zinc deficiency: Secondary | ICD-10-CM | POA: Diagnosis not present

## 2021-06-26 DIAGNOSIS — R7989 Other specified abnormal findings of blood chemistry: Secondary | ICD-10-CM | POA: Diagnosis not present

## 2021-06-26 DIAGNOSIS — Z79899 Other long term (current) drug therapy: Secondary | ICD-10-CM

## 2021-06-26 DIAGNOSIS — E118 Type 2 diabetes mellitus with unspecified complications: Secondary | ICD-10-CM | POA: Diagnosis not present

## 2021-06-26 DIAGNOSIS — Z0000 Encounter for general adult medical examination without abnormal findings: Secondary | ICD-10-CM | POA: Diagnosis not present

## 2021-06-26 DIAGNOSIS — E538 Deficiency of other specified B group vitamins: Secondary | ICD-10-CM

## 2021-06-26 DIAGNOSIS — Z23 Encounter for immunization: Secondary | ICD-10-CM

## 2021-06-26 DIAGNOSIS — Z136 Encounter for screening for cardiovascular disorders: Secondary | ICD-10-CM

## 2021-06-26 DIAGNOSIS — J439 Emphysema, unspecified: Secondary | ICD-10-CM

## 2021-06-26 LAB — LIPID PANEL
Cholesterol: 137 mg/dL (ref 0–200)
HDL: 45.3 mg/dL (ref >39.00)
LDL Cholesterol: 79 mg/dL (ref 0–99)
NonHDL: 91.51
Total CHOL/HDL Ratio: 3
Triglycerides: 64 mg/dL (ref 0.0–149.0)
VLDL: 12.8 mg/dL (ref 0.0–40.0)

## 2021-06-26 LAB — FOLATE: Folate: 2.9 ng/mL — ABNORMAL LOW (ref >5.9)

## 2021-06-26 LAB — VITAMIN D 25 HYDROXY (VIT D DEFICIENCY, FRACTURES): VITD: <7 ng/mL — ABNORMAL LOW (ref 30.00–100.00)

## 2021-06-26 LAB — COMPREHENSIVE METABOLIC PANEL
ALT: 49 U/L (ref 0–53)
AST: 40 U/L — ABNORMAL HIGH (ref 0–37)
Albumin: 4 g/dL (ref 3.5–5.2)
Alkaline Phosphatase: 255 U/L — ABNORMAL HIGH (ref 39–117)
BUN: 11 mg/dL (ref 6–23)
CO2: 25 mEq/L (ref 19–32)
Calcium: 7.8 mg/dL — ABNORMAL LOW (ref 8.4–10.5)
Chloride: 108 mEq/L (ref 96–112)
Creatinine, Ser: 0.84 mg/dL (ref 0.40–1.50)
GFR: 91.73 mL/min (ref >60.00)
Glucose, Bld: 132 mg/dL — ABNORMAL HIGH (ref 70–99)
Potassium: 4.3 mEq/L (ref 3.5–5.1)
Sodium: 139 mEq/L (ref 135–145)
Total Bilirubin: 0.6 mg/dL (ref 0.2–1.2)
Total Protein: 6.1 g/dL (ref 6.0–8.3)

## 2021-06-26 LAB — CBC
HCT: 44.6 % (ref 39.0–52.0)
Hemoglobin: 14.9 g/dL (ref 13.0–17.0)
MCHC: 33.3 g/dL (ref 30.0–36.0)
MCV: 93.4 fl (ref 78.0–100.0)
Platelets: 212 10*3/uL (ref 150.0–400.0)
RBC: 4.78 Mil/uL (ref 4.22–5.81)
RDW: 14.7 % (ref 11.5–15.5)
WBC: 4.9 10*3/uL (ref 4.0–10.5)

## 2021-06-26 LAB — VITAMIN B12: Vitamin B-12: 406 pg/mL (ref 211–911)

## 2021-06-26 LAB — HEMOGLOBIN A1C: Hgb A1c MFr Bld: 5.7 % (ref 4.6–6.5)

## 2021-06-26 NOTE — Progress Notes (Signed)
Chief Complaint  ?Patient presents with  ? Annual Exam  ? ? ?Well Male ?Daniel [name] is here for a complete physical.   ?His last physical was >1 year ago.  ?Current diet: in general, a "healthy" diet.   ?Current exercise: some walking ?Weight trend: stable ?Fatigue out of ordinary? No. ?Seat belt? Yes.   ?Advanced directive? No ? ?Health maintenance ?Shingrix- Yes ?Colonoscopy- Yes ?Tetanus- Yes ?Hep C- Yes ?Pneumonia vaccine- Due ?AAA screening- No ? ?Past Medical History:  ?Diagnosis Date  ? 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 Benign localized prostatic hyperplasia with lower urinary tract symptoms (LUTS)   ? Bilateral lower extremity edema   ? feet  ? COPD with emphysema ([place])   ? Diverticulosis of colon   ? ED (erectile dysfunction)   ? Generalized anxiety disorder   ? Hemorrhoids   ? History of colon polyps   ? History of diabetes mellitus, type II   ? 10-10-2017  per pt no issues after wt loss surgery   ? History of pneumothorax   ? [place]  right spontaneous, treated w/ chest tube  ? Hyperlipidemia   ? Hypertension   ? MDD (major depressive disorder)   ? OSA (obstructive sleep apnea)   ? 10-10-2017  not used cpap since 2016 after wt loss surgery 2015  ? Status post biliopancreatic diversion with duodenal switch   ? 07-30-2013   @ [facility] for wt. loss surgery  ? Vitamin D deficiency   ? Wears glasses   ? Zinc deficiency 04/27/2016  ?  ? ?Past Surgical History:  ?Procedure Laterality Date  ? ANAL FISTULOTOMY N/A 08/07/2016  ? Procedure: ANAL FISTULOTOMY;  Surgeon: Coralie Keens, MD;  Location: [place];  Service: General;  Laterality: N/A;  ? [place]  ? right lung for spontaneous pneumothorax  ? COLONOSCOPY W/ BIOPSIES  08/02/2016  ? EXOSTECTECTOMY TOE Left 04/25/2017  ? Procedure: TARSAL EXOSTECTOMY OF MEDIAL CUNEIFORM AND EXOSTECTOMY OF THE FIRST METATARSAL;  Surgeon: Edrick Kins, DPM;  Location: [facility];  Service: Podiatry;  Laterality: Left;  ? GASTROPLASTY DUODENAL SWITCH  07-30-2013    @[facility]    ? "wt loss surgery not gastric bypass surgery" (biliopancreatic diversion w/ duodenal switch)  ? HEMORRHOID SURGERY N/A 08/07/2016  ? Procedure: HEMORRHOIDECTOMY;  Surgeon: Coralie Keens, MD;  Location: [facility];  Service: General;  Laterality: N/A;  ? INCISION AND DRAINAGE ABSCESS N/A 10/14/2017  ? Procedure: POSSIBLE INCISION AND DRAINAGE OF ANAL ABSCESS;  Surgeon: Ileana Roup, MD;  Location: [facility];  Service: General;  Laterality: N/A;  ? INGUINAL HERNIA REPAIR Left 04/10/2017  ? Procedure: LAPAROSCOPIC LEFT INGUINAL HERNIA REPAIR WITH MESH;  Surgeon: Coralie Keens, MD;  Location: [facility];  Service: General;  Laterality: Left;  ? INSERTION OF MESH Left 04/10/2017  ? Procedure: INSERTION OF MESH;  Surgeon: Coralie Keens, MD;  Location: [facility];  Service: General;  Laterality: Left;  ? IRRIGATION AND DEBRIDEMENT SEBACEOUS CYST    ? back  ? MASS EXCISION N/A 01/29/2018  ? Procedure: EXCISION of lesion on scalp;  Surgeon: Wallace Going, DO;  Location: [facility];  Service: Plastics;  Laterality: N/A;  ? RECTAL EXAM UNDER ANESTHESIA N/A 10/14/2017  ? Procedure: ANAL EXAM UNDER ANESTHESIA, INJECTION OF FIBRIN GLUE;  Surgeon: Ileana Roup, MD;  Location: [facility];  Service: General;  Laterality: N/A;  ? ? ?Medications  ?Current  Outpatient Medications on File Prior to Visit  ?Medication Sig Dispense Refill  ? albuterol (VENTOLIN HFA) 108 (90 Base) MCG/ACT inhaler TAKE 2 PUFFS BY MOUTH 10-15 MINUTES PRIOR TO EXERCISE 18 g 2  ? amphetamine-dextroamphetamine (ADDERALL) 20 MG tablet Take 1 tablet (20 mg total) by mouth 2 (two) times daily. 60 tablet 0  ? buPROPion (WELLBUTRIN XL) 150 MG 24 hr tablet TAKE ONE TABLET BY MOUTH THREE TIMES A DAY 270 tablet 1  ? Calcium Carb-Cholecalciferol 600-500 MG-UNIT CAPS Take 1 tablet by mouth daily.    ? Cholecalciferol (VITAMIN D3) 1000 units CAPS Take 1,000 Units by  mouth daily.    ? clobetasol (TEMOVATE) 0.05 % external solution APPLY TO AFFECTED AREA(S) ON SCALP DAILY AS DIRECTED AS NEEDED FOR ITCHING 50 mL 1  ? diazepam (VALIUM) 5 MG tablet TAKE 1 TO 2 TABLETS BY MOUTH EVERY 8 HOURS AS NEEDED FOR ANXIETY 90 tablet 2  ? DULoxetine (CYMBALTA) 30 MG capsule TAKE 3 CAPSULES BY MOUTH EVERY DAY 270 capsule 2  ? finasteride (PROPECIA) 1 MG tablet TAKE ONE TABLET BY MOUTH DAILY 30 tablet 1  ? folic acid (FOLVITE) A999333 MCG tablet     ? HYDROcodone bit-homatropine (HYCODAN) 5-1.5 MG/5ML syrup Take 5 mLs by mouth every 6 (six) hours as needed for cough. 120 mL 0  ? hydrocortisone 2.5 % cream Apply topically 2 (two) times daily. 30 g 0  ? lisinopril (ZESTRIL) 2.5 MG tablet TAKE ONE TABLET BY MOUTH DAILY 30 tablet 0  ? meloxicam (MOBIC) 15 MG tablet Take 1 tablet (15 mg total) by mouth daily. 30 tablet 0  ? nebivolol (BYSTOLIC) 10 MG tablet Take 1 tablet (10 mg total) by mouth 2 (two) times daily. 180 tablet 3  ? Omega-3 300 MG CAPS Take 300 mg by mouth daily.    ? pantoprazole (PROTONIX) 40 MG tablet TAKE ONE TABLET BY MOUTH DAILY 30 tablet 1  ? pregabalin (LYRICA) 75 MG capsule Take 1 capsule (75 mg total) by mouth at bedtime as needed (RLS). 30 capsule 2  ? rOPINIRole (REQUIP) 1 MG tablet TAKE TWO TABLETS BY MOUTH EVERY NIGHT AT BEDTIME 180 tablet 1  ? sildenafil (VIAGRA) 100 MG tablet TAKE ONE TABLET BY MOUTH DAILY AS NEEDED FOR ERECTILE DYSFUNCTION 30 tablet 7  ? tamsulosin (FLOMAX) 0.4 MG CAPS capsule Take 1 capsule (0.4 mg total) by mouth daily after supper. 90 capsule 1  ? temazepam (RESTORIL) 15 MG capsule TAKE ONE CAPSULE BY MOUTH EVERY NIGHT AT BEDTIME AS NEEDED FOR SLEEP 30 capsule 5  ? testosterone cypionate (DEPO-TESTOSTERONE) 200 MG/ML injection Inject 1 mL (200 mg total) into the muscle every 14 (fourteen) days. 10 mL 0  ? VITAMIN A PO Take 2,400 Units by mouth daily.    ? Vitamin D, Ergocalciferol, (DRISDOL) 1.25 MG (50000 UNIT) CAPS capsule TAKE ONE CAPSULE BY MOUTH  EVERY 7 DAYS 12 capsule 1  ? vitamin E 200 UNIT capsule Take 200 Units by mouth daily.    ? vitamin k 100 MCG tablet     ? Zinc 50 MG CAPS Take 1 capsule by mouth daily.    ? zinc gluconate 50 MG tablet Take 100 mg by mouth daily.    ? zolpidem (AMBIEN) 10 MG tablet TAKE ONE TABLET BY MOUTH EVERY NIGHT AT BEDTIME 30 tablet 5  ? furosemide (LASIX) 20 MG tablet One by mouth once a day as needed for swelling 30 tablet 1  ? ?No current facility-administered medications on file prior  to visit.  ?  ? ?Allergies ?No Known Allergies ? ?Family History ?Family History  ?Problem Relation Age of Onset  ? Cancer Mother   ?     ovarian  ? Hyperlipidemia Father   ? Hypertension Father   ? Heart disease Father   ?     MI 7/19  ? Emphysema Father   ? Pulmonary disease Father   ? Heart disease Maternal Grandmother   ? Heart disease Maternal Grandfather   ? Stroke Maternal Grandfather   ? Heart disease Paternal Grandmother   ? Angina Paternal Grandmother   ? Heart disease Paternal Grandfather   ? Stroke Paternal Grandfather   ? Diabetes Maternal Uncle   ? Kidney disease Maternal Uncle   ? Diabetes Paternal Uncle   ? Cancer Paternal Uncle   ? Neuropathy Neg Hx   ? ? ?Review of Systems: ?Constitutional:  no fevers ?Eye:  no recent significant change in vision ?Ears:  No changes in hearing ?Nose/Mouth/Throat:  no complaints of nasal congestion, no sore throat ?Cardiovascular: no chest pain ?Respiratory:  No shortness of breath ?Gastrointestinal:  No change in bowel habits ?GU:  No frequency ?Integumentary:  no abnormal skin lesions reported ?Neurologic:  no headaches ?Endocrine:  denies unexplained weight changes ? ?Exam ?BP 108/64   Pulse 72   Temp 98 ?F (36.7 ?C) (Oral)   Ht 6\' 1"  (1.854 m)   Wt 188 lb 8 oz (85.5 kg)   SpO2 99%   BMI 24.87 kg/m?  ?General:  well developed, well nourished, in no apparent distress ?Skin:  no significant moles, warts, or growths ?Head:  no masses, lesions, or tenderness ?Eyes:  pupils equal and  round, sclera anicteric without injection ?Ears:  canals without lesions, TMs shiny without retraction, no obvious effusion, no erythema ?Nose:  nares patent, septum midline, mucosa normal ?Throat/Pharynx:  lips and g

## 2021-06-26 NOTE — Patient Instructions (Addendum)
Give [place] 2-3 business days to get the results of your labs back.  ? ?Keep the diet clean and stay active. ? ?Please get me a copy of your advanced directive form at your convenience.  ? ?Wear your seatbelt.  ? ?Let us know if you need anything. ?

## 2021-06-26 NOTE — Addendum Note (Signed)
Addended by: Scharlene Gloss B on: 06/26/2021 02:42 PM ? ? Modules accepted: Orders ? ?

## 2021-06-27 LAB — MICROALBUMIN / CREATININE URINE RATIO
Creatinine,U: 189.2 mg/dL
Microalb Creat Ratio: 6.1 mg/g (ref 0.0–30.0)
Microalb, Ur: 11.5 mg/dL — ABNORMAL HIGH (ref 0.0–1.9)

## 2021-06-28 ENCOUNTER — Other Ambulatory Visit: Payer: Self-pay | Admitting: Family Medicine

## 2021-06-28 DIAGNOSIS — R7401 Elevation of levels of liver transaminase levels: Secondary | ICD-10-CM

## 2021-06-28 LAB — DRUG MONITORING PANEL 376104, URINE
Alphahydroxyalprazolam: NEGATIVE ng/mL (ref <25)
Alphahydroxymidazolam: NEGATIVE ng/mL (ref <50)
Alphahydroxytriazolam: NEGATIVE ng/mL (ref <50)
Aminoclonazepam: NEGATIVE ng/mL (ref <25)
Amphetamine: 9619 ng/mL — ABNORMAL HIGH (ref <250)
Amphetamines: POSITIVE ng/mL — AB (ref <500)
Barbiturates: NEGATIVE ng/mL (ref <300)
Benzodiazepines: POSITIVE ng/mL — AB (ref <100)
Cocaine Metabolite: NEGATIVE ng/mL (ref <150)
Desmethyltramadol: NEGATIVE ng/mL (ref <100)
Hydroxyethylflurazepam: NEGATIVE ng/mL (ref <50)
Lorazepam: NEGATIVE ng/mL (ref <50)
Methamphetamine: NEGATIVE ng/mL (ref <250)
Nordiazepam: 216 ng/mL — ABNORMAL HIGH (ref <50)
Opiates: NEGATIVE ng/mL (ref <100)
Oxazepam: 1124 ng/mL — ABNORMAL HIGH (ref <50)
Oxycodone: NEGATIVE ng/mL (ref <100)
Temazepam: 608 ng/mL — ABNORMAL HIGH (ref <50)
Tramadol: NEGATIVE ng/mL (ref <100)

## 2021-06-28 LAB — DM TEMPLATE

## 2021-06-28 LAB — ZINC: Zinc: 69 ug/dL (ref 60–130)

## 2021-07-02 LAB — VITAMIN B1: Vitamin B1 (Thiamine): 7 nmol/L — ABNORMAL LOW (ref 8–30)

## 2021-07-05 ENCOUNTER — Other Ambulatory Visit: Payer: Self-pay | Admitting: Family Medicine

## 2021-07-05 ENCOUNTER — Inpatient Hospital Stay: Payer: Medicare Other | Attending: Hematology and Oncology

## 2021-07-05 ENCOUNTER — Ambulatory Visit (INDEPENDENT_AMBULATORY_CARE_PROVIDER_SITE_OTHER): Payer: Medicare Other | Admitting: Podiatry

## 2021-07-05 ENCOUNTER — Other Ambulatory Visit: Payer: Self-pay

## 2021-07-05 ENCOUNTER — Ambulatory Visit (INDEPENDENT_AMBULATORY_CARE_PROVIDER_SITE_OTHER): Payer: Medicare Other

## 2021-07-05 DIAGNOSIS — M7752 Other enthesopathy of left foot: Secondary | ICD-10-CM

## 2021-07-05 DIAGNOSIS — R52 Pain, unspecified: Secondary | ICD-10-CM

## 2021-07-05 DIAGNOSIS — D509 Iron deficiency anemia, unspecified: Secondary | ICD-10-CM | POA: Diagnosis not present

## 2021-07-05 LAB — CBC WITH DIFFERENTIAL (CANCER CENTER ONLY)
Abs Immature Granulocytes: 0.01 10*3/uL (ref 0.00–0.07)
Basophils Absolute: 0 10*3/uL (ref 0.0–0.1)
Basophils Relative: 1 %
Eosinophils Absolute: 0.3 10*3/uL (ref 0.0–0.5)
Eosinophils Relative: 6 %
HCT: 45 % (ref 39.0–52.0)
Hemoglobin: 15.1 g/dL (ref 13.0–17.0)
Immature Granulocytes: 0 %
Lymphocytes Relative: 16 %
Lymphs Abs: 0.9 10*3/uL (ref 0.7–4.0)
MCH: 31.4 pg (ref 26.0–34.0)
MCHC: 33.6 g/dL (ref 30.0–36.0)
MCV: 93.6 fL (ref 80.0–100.0)
Monocytes Absolute: 0.4 10*3/uL (ref 0.1–1.0)
Monocytes Relative: 7 %
Neutro Abs: 3.9 10*3/uL (ref 1.7–7.7)
Neutrophils Relative %: 70 %
Platelet Count: 202 10*3/uL (ref 150–400)
RBC: 4.81 MIL/uL (ref 4.22–5.81)
RDW: 14 % (ref 11.5–15.5)
WBC Count: 5.5 10*3/uL (ref 4.0–10.5)
nRBC: 0 % (ref 0.0–0.2)

## 2021-07-05 LAB — IRON AND IRON BINDING CAPACITY (CC-WL,HP ONLY)
Iron: 81 ug/dL (ref 45–182)
Saturation Ratios: 20 % (ref 17.9–39.5)
TIBC: 414 ug/dL (ref 250–450)
UIBC: 333 ug/dL (ref 117–376)

## 2021-07-05 LAB — FERRITIN: Ferritin: 16 ng/mL — ABNORMAL LOW (ref 24–336)

## 2021-07-05 MED ORDER — BETAMETHASONE SOD PHOS & ACET 6 (3-3) MG/ML IJ SUSP
3.0000 mg | Freq: Once | INTRAMUSCULAR | Status: AC
  Administered 2021-07-05: 3 mg via INTRA_ARTICULAR

## 2021-07-05 MED ORDER — BUPROPION HCL ER (XL) 150 MG PO TB24: 150.0000 mg | ORAL_TABLET | Freq: Three times a day (TID) | ORAL | 1 refills | Status: DC

## 2021-07-05 MED ORDER — MELOXICAM 15 MG PO TABS: 15.0000 mg | ORAL_TABLET | Freq: Every day | ORAL | 1 refills | Status: DC

## 2021-07-05 NOTE — Progress Notes (Signed)
? ?  Subjective:  ?66 y.o. male presenting today for evaluation of left ankle pain has been going on for about 10 days now.  Patient states that he missed a step and landed hard on his left foot.  He has been experiencing left lateral ankle pain now ever since the injury.  He has not done anything for treatment currently.  He presents for further treatment and evaluation ? ? ?Past Medical History:  ?Diagnosis Date  ? 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 Benign localized prostatic hyperplasia with lower urinary tract symptoms (LUTS)   ? Bilateral lower extremity edema   ? feet  ? COPD with emphysema ([place])   ? Diverticulosis of colon   ? ED (erectile dysfunction)   ? Generalized anxiety disorder   ? Hemorrhoids   ? History of colon polyps   ? History of diabetes mellitus, type II   ? 10-10-2017  per pt no issues after wt loss surgery   ? History of pneumothorax   ? [place]  right spontaneous, treated w/ chest tube  ? Hyperlipidemia   ? Hypertension   ? MDD (major depressive disorder)   ? OSA (obstructive sleep apnea)   ? 10-10-2017  not used cpap since 2016 after wt loss surgery 2015  ? Status post biliopancreatic diversion with duodenal switch   ? 07-30-2013   @ [facility] for wt[facility] loss surgery  ? Vitamin D deficiency   ? Wears glasses   ? Zinc deficiency 04/27/2016  ? ? ? ?Objective / Physical Exam:  ?General:  ?The patient is alert and oriented x3 in no acute distress. ?Dermatology:  ?Skin is warm, dry and supple bilateral lower extremities. Negative for open lesions or macerations. ?Vascular:  ?Palpable pedal pulses bilaterally.  Minimal chronic edema noted left lower extremity. Capillary refill within normal limits. ?Neurological:  ?Epicritic and protective threshold grossly intact bilaterally.  ?Musculoskeletal Exam:  ?Pain on palpation to the lateral aspect of the patient's left ankle. Mild edema noted. Range of motion within normal limits to all pedal and ankle joints bilateral. Muscle strength 5/5 in all groups bilateral.   ? ?Radiographic Exam:  ?Normal osseous mineralization. Joint spaces preserved. No fracture/dislocation/boney destruction.   ? ?Assessment: ?1.  Capsulitis lateral aspect of the left ankle ? ?Plan of Care:  ?1. Patient was evaluated. X-Rays reviewed.  ?2. Injection of 0.5 mL Cel[place] Soluspan injected in the patient's left ankle. ?3.  Prescription for meloxicam 15 mg daily ?4.  Compression ankle sleeve dispensed ?5.  Patient declined cam boot.  Recommend good supportive shoes and sneakers ?6.  Return to clinic 3 weeks ? ? ?Edrick Kins, DPM ?[facility] ? ?Dr. Edrick Kins, DPM  ?  ?[address]                                        ?[place], [place] [address]                ?Office [phone]  ?Fax [phone] ? ? ? ? ? ? ?

## 2021-07-06 ENCOUNTER — Inpatient Hospital Stay (HOSPITAL_BASED_OUTPATIENT_CLINIC_OR_DEPARTMENT_OTHER): Payer: Medicare Other | Admitting: Hematology and Oncology

## 2021-07-06 DIAGNOSIS — D509 Iron deficiency anemia, unspecified: Secondary | ICD-10-CM | POA: Diagnosis not present

## 2021-07-06 NOTE — Assessment & Plan Note (Signed)
06/15/19: Ferritin 4.9, Iron sat: 7%, Folate 2.5, ?03/25/20: Ferritin 7.5, Iron sat: 7.5%, B 12 492, Folate 5.9, Hb 12.8, MCV 90, RDW 15 ?09/28/2020: Hemoglobin 14.5, platelets 122, ANC 1.3, ferritin 53 (improved from 7.5), iron saturation 27% (improved from 7.5) ?04/05/2021: Ferritin 9, iron saturation 16%, hemoglobin 15  ?07/05/2021: Hemoglobin 15.1, MCV 93.6, platelets 202, iron saturation 20%, TIBC 414, ferritin 16 ?? ?Status post biliopancreatic diversion with duodenal switch?2015 at [facility] ?? ?IV iron: March 2022 ? ?Based on the above lab work we can continue to watch and monitor iron studies and CBC ?Recheck labs in 6 months and follow-up after that with a telephone visit ?

## 2021-07-07 ENCOUNTER — Other Ambulatory Visit: Payer: Self-pay | Admitting: Family Medicine

## 2021-07-10 ENCOUNTER — Encounter: Payer: Self-pay | Admitting: Family Medicine

## 2021-07-10 ENCOUNTER — Other Ambulatory Visit: Payer: Self-pay | Admitting: Family Medicine

## 2021-07-10 MED ORDER — VITAMIN D (ERGOCALCIFEROL) 1.25 MG (50000 UNIT) PO CAPS: ORAL_CAPSULE | ORAL | 1 refills | Status: DC

## 2021-07-11 ENCOUNTER — Other Ambulatory Visit: Payer: Self-pay | Admitting: Family Medicine

## 2021-07-11 DIAGNOSIS — K219 Gastro-esophageal reflux disease without esophagitis: Secondary | ICD-10-CM

## 2021-07-12 ENCOUNTER — Ambulatory Visit: Payer: Medicare Other | Admitting: Podiatry

## 2021-07-12 DIAGNOSIS — M7752 Other enthesopathy of left foot: Secondary | ICD-10-CM

## 2021-07-12 DIAGNOSIS — M7672 Peroneal tendinitis, left leg: Secondary | ICD-10-CM | POA: Diagnosis not present

## 2021-07-12 MED ORDER — METHYLPREDNISOLONE 4 MG PO TBPK: ORAL_TABLET | ORAL | 0 refills | Status: DC

## 2021-07-22 ENCOUNTER — Ambulatory Visit
Admission: RE | Admit: 2021-07-22 | Discharge: 2021-07-22 | Disposition: A | Discharge status: Home / Self Care | Payer: Medicare Other | Source: Ambulatory Visit | Attending: Podiatry | Admitting: Podiatry

## 2021-07-22 DIAGNOSIS — S92015A Nondisplaced fracture of body of left calcaneus, initial encounter for closed fracture: Secondary | ICD-10-CM | POA: Diagnosis not present

## 2021-07-22 DIAGNOSIS — M7672 Peroneal tendinitis, left leg: Secondary | ICD-10-CM

## 2021-07-22 DIAGNOSIS — M7989 Other specified soft tissue disorders: Secondary | ICD-10-CM | POA: Diagnosis not present

## 2021-07-22 DIAGNOSIS — M7752 Other enthesopathy of left foot: Secondary | ICD-10-CM

## 2021-07-23 ENCOUNTER — Other Ambulatory Visit: Payer: Self-pay | Admitting: Family Medicine

## 2021-07-23 DIAGNOSIS — L649 Androgenic alopecia, unspecified: Secondary | ICD-10-CM

## 2021-07-24 ENCOUNTER — Telehealth: Payer: Self-pay | Admitting: *Deleted

## 2021-07-24 ENCOUNTER — Other Ambulatory Visit: Payer: Self-pay

## 2021-07-24 ENCOUNTER — Encounter: Payer: Self-pay | Admitting: Family Medicine

## 2021-07-24 DIAGNOSIS — F411 Generalized anxiety disorder: Secondary | ICD-10-CM | POA: Diagnosis not present

## 2021-07-24 DIAGNOSIS — F330 Major depressive disorder, recurrent, mild: Secondary | ICD-10-CM | POA: Diagnosis not present

## 2021-07-24 DIAGNOSIS — R7989 Other specified abnormal findings of blood chemistry: Secondary | ICD-10-CM

## 2021-07-24 NOTE — Progress Notes (Signed)
? ?  Subjective:  ?66 y.o. male presenting today for follow-up evaluation of left ankle pain.  Patient states that since last visit his pain has increased.  The pain is now severe and he is limping on his foot with a significant antalgic gait especially during weightbearing.  He says the injection did not help.  He also takes the meloxicam daily with no improvement. ? ?Past Medical History:  ?Diagnosis Date  ? 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 Benign localized prostatic hyperplasia with lower urinary tract symptoms (LUTS)   ? Bilateral lower extremity edema   ? feet  ? COPD with emphysema ([facility])   ? Diverticulosis of colon   ? ED (erectile dysfunction)   ? Generalized anxiety disorder   ? Hemorrhoids   ? History of colon polyps   ? History of diabetes mellitus, type II   ? 10-10-2017  per pt no issues after wt loss surgery   ? History of pneumothorax   ? 1983 & 1988  right spontaneous, treated w/ chest tube  ? Hyperlipidemia   ? Hypertension   ? MDD (major depressive disorder)   ? OSA (obstructive sleep apnea)   ? 10-10-2017  not used cpap since 2016 after wt loss surgery 2015  ? Status post biliopancreatic diversion with duodenal switch   ? 07-30-2013   @ [facility] for wt. loss surgery  ? Vitamin D deficiency   ? Wears glasses   ? Zinc deficiency 04/27/2016  ? ? ? ?Objective / Physical Exam:  ?General:  ?The patient is alert and oriented x3 in no acute distress. ?Dermatology:  ?Skin is warm, dry and supple bilateral lower extremities. Negative for open lesions or macerations. ?Vascular:  ?Palpable pedal pulses bilaterally.  Minimal chronic edema noted left lower extremity. Capillary refill within normal limits. ?Neurological:  ?Epicritic and protective threshold grossly intact bilaterally.  ?Musculoskeletal Exam:  ?There continues to be significant pain on palpation to the lateral aspect of the patient's left ankle. Mild edema noted. Range of motion within normal limits to all pedal and ankle joints bilateral. Muscle strength  5/5 in all groups bilateral.  ? ?Radiographic Exam 07/05/2021 LT ankle:  ?Normal osseous mineralization. Joint spaces preserved. No fracture/dislocation/boney destruction.   ? ?Assessment: ?1.  Capsulitis lateral aspect of the left ankle ? ?Plan of Care:  ?1. Patient was evaluated. ?2.  Unfortunately patient has not improved and the pain is becoming worse.  He says that the injection did not touch the pain.  He continues to take meloxicam daily.  At this time I do believe MRI is warranted due to the progressing nature of the patient's pain ?3.  MRI ordered left ankle without contrast ?4.  Prescription for Medrol Dosepak, then resume meloxicam 15 mg daily ?5.  Continue compression sleeve with good supportive shoes and sneakers ?6.  Return to clinic after MRI to review results and discuss further treatment options ? ? ?Felecia Shelling, DPM ?[facility] ? ?Dr. Felecia Shelling, DPM  ?  ?[address]                                        ?[place], [place] [address]                ?Office [phone]  ?Fax [phone] ? ? ? ? ? ? ?

## 2021-07-24 NOTE — Telephone Encounter (Signed)
[facility] calling to let the physician know that the MRI,left ankle is ready to view in epic.  ?

## 2021-07-25 ENCOUNTER — Telehealth: Payer: Self-pay | Admitting: *Deleted

## 2021-07-25 NOTE — Progress Notes (Signed)
Spoke with patient.  Reviewed MRI results.  Discussed different treatment options and offloading techniques.  Patient is going to come into the office tomorrow to pick up a cam boot.  Discussed offloading including walker, knee scooter, and crutches.  The patient would rather just walk in the cam boot and use a cane.  He will follow-up in the office 4 weeks.  Thanks, Dr. Logan Bores

## 2021-07-25 NOTE — Telephone Encounter (Signed)
Spoke with patient.  Reviewed MRI results.  Coming in tomorrow to pick up a cam boot.

## 2021-07-25 NOTE — Telephone Encounter (Signed)
Patient calling for MRI  results,please advise.   

## 2021-08-06 ENCOUNTER — Other Ambulatory Visit: Payer: Self-pay | Admitting: Family Medicine

## 2021-08-28 ENCOUNTER — Other Ambulatory Visit: Payer: Self-pay | Admitting: Family Medicine

## 2021-08-28 MED ORDER — LISINOPRIL 2.5 MG PO TABS: 2.5000 mg | ORAL_TABLET | Freq: Every day | ORAL | 3 refills | Status: DC

## 2021-09-29 ENCOUNTER — Telehealth: Payer: Self-pay | Admitting: Family Medicine

## 2021-09-29 ENCOUNTER — Ambulatory Visit: Payer: Medicare Other | Admitting: Family Medicine

## 2021-09-29 DIAGNOSIS — K610 Anal abscess: Secondary | ICD-10-CM | POA: Diagnosis not present

## 2021-09-29 NOTE — Telephone Encounter (Signed)
1145, might be a small wait.

## 2021-09-29 NOTE — Telephone Encounter (Signed)
Pt stated he has an infected anal fistula and wanted be seen today. Do not see any available slots so advised pt he may need to go to urgent care if he is concerned.

## 2021-09-29 NOTE — Telephone Encounter (Signed)
Called pt and pt stated has appointment with surgeon today don't need appt

## 2021-10-02 ENCOUNTER — Encounter: Payer: Self-pay | Admitting: Family Medicine

## 2021-10-02 ENCOUNTER — Ambulatory Visit (INDEPENDENT_AMBULATORY_CARE_PROVIDER_SITE_OTHER): Payer: Medicare Other | Admitting: Family Medicine

## 2021-10-02 VITALS — BP 122/70 | HR 67 | Temp 98.2°F | Ht 73.0 in | Wt 187.1 lb

## 2021-10-02 DIAGNOSIS — L03317 Cellulitis of buttock: Secondary | ICD-10-CM

## 2021-10-02 DIAGNOSIS — L0231 Cutaneous abscess of buttock: Secondary | ICD-10-CM | POA: Diagnosis not present

## 2021-10-02 MED ORDER — OXYCODONE-ACETAMINOPHEN 5-325 MG PO TABS: 1.0000 | ORAL_TABLET | Freq: Four times a day (QID) | ORAL | 0 refills | Status: AC | PRN

## 2021-10-02 NOTE — Telephone Encounter (Signed)
Patient called back stating he has another boil that needs to be lance on his right butt cheek and would like for Dr. Carmelia Roller to see him. He was offered an OV on Wednesday but patient stated that Dr. Carmelia Roller was going to squeeze him in the last time he called so he wants him to squeeze him in today too. Please advise.

## 2021-10-02 NOTE — Patient Instructions (Addendum)
Take 7 days of doxycycline twice daily.  Do not shower for the rest of the day. When you do wash it, use only soap and water. Do not vigorously scrub. At least twice daily dressing changes. Keep the area clean and dry.   Things to look out for: increasing pain not relieved by ibuprofen/acetaminophen, fevers, spreading redness, increasing drainage of pus, or increasing foul odor.  Let us know if you need anything.

## 2021-10-02 NOTE — Telephone Encounter (Signed)
Called and he agreed to 4:20 and will be put on the schedule.

## 2021-10-02 NOTE — Progress Notes (Signed)
Chief Complaint  Patient presents with   Abscess    Daniel [name] is a 66 y.o. male here for a skin complaint.  Duration: 2 days Location: R buttock Pruritic? No Painful? Yes Drainage? No Other associated symptoms: no fevers, does not seem to be growing Therapies tried thus far: none  Past Medical History:  Diagnosis Date   Anal fistula    recurrent   Benign localized prostatic hyperplasia with lower urinary tract symptoms (LUTS)    Bilateral lower extremity edema    feet   COPD with emphysema ([facility])    Diverticulosis of colon    ED (erectile dysfunction)    Generalized anxiety disorder    Hemorrhoids    History of colon polyps    History of diabetes mellitus, type II    10-10-2017  per pt no issues after wt loss surgery    History of pneumothorax    1983 & 1988  right spontaneous, treated w/ chest tube   Hyperlipidemia    Hypertension    MDD (major depressive disorder)    OSA (obstructive sleep apnea)    10-10-2017  not used cpap since 2016 after wt loss surgery 2015   Status post biliopancreatic diversion with duodenal switch    07-30-2013   @ [facility] for wt. loss surgery   Vitamin D deficiency    Wears glasses    Zinc deficiency 04/27/2016    BP 122/70   Pulse 67   Temp 98.2 F (36.8 C) (Oral)   Ht 6\' 1"  (1.854 m)   Wt 187 lb 2 oz (84.9 kg)   SpO2 98%   BMI 24.69 kg/m  Gen: awake, alert, appearing stated age Lungs: No accessory muscle use Skin: on R buttock, there is a fluctuant and circular area of erythema, warmth, and ttp. Diameter is 5 cm. No drainage, excoriation, scaling Psych: Age appropriate judgment and insight  Procedure note; incision and drainage Informed consent obtained. The area was cleaned with Hibiclens. The area was anesthetized with 5 mL of 1% lidocaine with epinephrine. Once adequate anesthesia was obtained, a cruciate incision was made with 11 blade scalpel. Approximately 5 mL of purulent material with blood was expressed. Loculations  were interrupted with a curved hemostat. The area was packed with approximately 8 cm of 1/4 in gauze. The area was then dressed with gauze dressing. There were no complications noted. The patient tolerated the procedure well.  Abscess and cellulitis of gluteal region - Plan: PR DRAIN SKIN ABSCESS SIMPLE, oxyCODONE-acetaminophen (PERCOCET/ROXICET) 5-325 MG tablet  Pain med as above. He has been on before. Doxy for 7 d, has supply from derm. Pull packing in 4 d. Warning signs and symptoms verbalized and written down in AVS.  The patient voiced understanding and agreement to the plan.  [place], DO 10/02/21 4:59 PM

## 2021-10-05 ENCOUNTER — Other Ambulatory Visit: Payer: Self-pay | Admitting: Family Medicine

## 2021-10-05 DIAGNOSIS — G2581 Restless legs syndrome: Secondary | ICD-10-CM

## 2021-10-05 MED ORDER — PREGABALIN 75 MG PO CAPS: ORAL_CAPSULE | ORAL | 5 refills | Status: DC

## 2021-10-05 NOTE — Telephone Encounter (Signed)
Printed by Error

## 2021-10-05 NOTE — Addendum Note (Signed)
Addended by: Radene Gunning on: 10/05/2021 08:54 AM   Modules accepted: Orders

## 2021-10-05 NOTE — Addendum Note (Signed)
Addended by: Kathi Ludwig on: 10/05/2021 08:16 AM   Modules accepted: Orders

## 2021-10-19 ENCOUNTER — Other Ambulatory Visit: Payer: Self-pay | Admitting: Family Medicine

## 2021-10-19 DIAGNOSIS — L649 Androgenic alopecia, unspecified: Secondary | ICD-10-CM

## 2021-10-20 DIAGNOSIS — E291 Testicular hypofunction: Secondary | ICD-10-CM | POA: Diagnosis not present

## 2021-10-23 ENCOUNTER — Other Ambulatory Visit: Payer: Self-pay | Admitting: Family Medicine

## 2021-10-23 NOTE — Telephone Encounter (Signed)
Requesting: diazepam 5mg   Contract: None UDS: 06/26/21 Last Visit: 10/02/21 Next Visit: None Last Refill: 04/24/21 #90 and 2RF  Please Advise

## 2021-10-31 ENCOUNTER — Other Ambulatory Visit: Payer: Self-pay | Admitting: Family Medicine

## 2021-10-31 DIAGNOSIS — K219 Gastro-esophageal reflux disease without esophagitis: Secondary | ICD-10-CM

## 2021-11-30 ENCOUNTER — Other Ambulatory Visit: Payer: Self-pay | Admitting: Family Medicine

## 2021-12-25 ENCOUNTER — Other Ambulatory Visit: Payer: Self-pay | Admitting: Family Medicine

## 2021-12-25 NOTE — Telephone Encounter (Signed)
Last OV--10/02/21 Last RF-- #30 with 5 refills on 06/07/21 [facility]----06/26/2021

## 2021-12-29 DIAGNOSIS — R5382 Chronic fatigue, unspecified: Secondary | ICD-10-CM | POA: Diagnosis not present

## 2022-01-03 ENCOUNTER — Other Ambulatory Visit: Payer: Self-pay | Admitting: *Deleted

## 2022-01-03 DIAGNOSIS — D509 Iron deficiency anemia, unspecified: Secondary | ICD-10-CM

## 2022-01-04 ENCOUNTER — Inpatient Hospital Stay: Payer: Medicare Other

## 2022-01-05 ENCOUNTER — Telehealth: Payer: Medicare Other | Admitting: Hematology and Oncology

## 2022-01-07 ENCOUNTER — Other Ambulatory Visit: Payer: Self-pay | Admitting: Family Medicine

## 2022-01-07 DIAGNOSIS — L649 Androgenic alopecia, unspecified: Secondary | ICD-10-CM

## 2022-01-21 ENCOUNTER — Other Ambulatory Visit: Payer: Self-pay | Admitting: Family Medicine

## 2022-01-22 DIAGNOSIS — E291 Testicular hypofunction: Secondary | ICD-10-CM | POA: Diagnosis not present

## 2022-01-22 DIAGNOSIS — Z87898 Personal history of other specified conditions: Secondary | ICD-10-CM | POA: Diagnosis not present

## 2022-01-22 DIAGNOSIS — F330 Major depressive disorder, recurrent, mild: Secondary | ICD-10-CM | POA: Diagnosis not present

## 2022-01-22 DIAGNOSIS — Z79899 Other long term (current) drug therapy: Secondary | ICD-10-CM | POA: Diagnosis not present

## 2022-01-22 DIAGNOSIS — F411 Generalized anxiety disorder: Secondary | ICD-10-CM | POA: Diagnosis not present

## 2022-01-22 NOTE — Telephone Encounter (Signed)
Last OV---10/02/2021 Last RF---10/23/2021--#90 with 1 refill

## 2022-01-26 ENCOUNTER — Ambulatory Visit
Admission: RE | Admit: 2022-01-26 | Discharge: 2022-01-26 | Disposition: A | Discharge status: Home / Self Care | Payer: Medicare Other | Source: Ambulatory Visit | Attending: Family Medicine | Admitting: Family Medicine

## 2022-01-26 DIAGNOSIS — F17210 Nicotine dependence, cigarettes, uncomplicated: Secondary | ICD-10-CM | POA: Diagnosis not present

## 2022-01-26 DIAGNOSIS — Z87891 Personal history of nicotine dependence: Secondary | ICD-10-CM

## 2022-01-30 ENCOUNTER — Other Ambulatory Visit: Payer: Self-pay

## 2022-01-30 DIAGNOSIS — F17210 Nicotine dependence, cigarettes, uncomplicated: Secondary | ICD-10-CM

## 2022-01-30 DIAGNOSIS — Z122 Encounter for screening for malignant neoplasm of respiratory organs: Secondary | ICD-10-CM

## 2022-01-30 DIAGNOSIS — Z87891 Personal history of nicotine dependence: Secondary | ICD-10-CM

## 2022-02-02 ENCOUNTER — Other Ambulatory Visit: Payer: Self-pay

## 2022-02-02 ENCOUNTER — Encounter: Payer: Self-pay | Admitting: Family Medicine

## 2022-02-02 ENCOUNTER — Ambulatory Visit (INDEPENDENT_AMBULATORY_CARE_PROVIDER_SITE_OTHER): Payer: Medicare Other | Admitting: Family Medicine

## 2022-02-02 ENCOUNTER — Ambulatory Visit (HOSPITAL_BASED_OUTPATIENT_CLINIC_OR_DEPARTMENT_OTHER)
Admission: RE | Admit: 2022-02-02 | Discharge: 2022-02-02 | Disposition: A | Discharge status: Home / Self Care | Payer: Medicare Other | Source: Ambulatory Visit | Attending: Family Medicine | Admitting: Family Medicine

## 2022-02-02 VITALS — BP 131/80 | HR 69 | Temp 98.0°F

## 2022-02-02 DIAGNOSIS — R0789 Other chest pain: Secondary | ICD-10-CM | POA: Diagnosis not present

## 2022-02-02 DIAGNOSIS — S2242XA Multiple fractures of ribs, left side, initial encounter for closed fracture: Secondary | ICD-10-CM | POA: Diagnosis not present

## 2022-02-02 MED ORDER — HYDROMORPHONE HCL 2 MG PO TABS: 1.0000 mg | ORAL_TABLET | Freq: Four times a day (QID) | ORAL | 0 refills | Status: DC | PRN

## 2022-02-02 MED ORDER — KETOROLAC TROMETHAMINE 60 MG/2ML IM SOLN
60.0000 mg | Freq: Once | INTRAMUSCULAR | Status: AC
  Administered 2022-02-02: 60 mg via INTRAMUSCULAR

## 2022-02-02 NOTE — Patient Instructions (Addendum)
Get a balloon and blow it up every hour while awake to help prevent pneumonia.   Ice/cold pack over area for 10-15 min twice daily.  OK to take Tylenol 1000 mg (2 extra strength tabs) or 975 mg (3 regular strength tabs) every 6 hours as needed.  Starting this evening, ibuprofen 400-600 mg (2-3 over the counter strength tabs) every 6 hours as needed for pain.  Do not drink alcohol, do any illicit/street drugs, drive or do anything that requires alertness while on this medicine.   We will be in touch regarding the official read of your X-ray.   Let us know if you need anything.

## 2022-02-02 NOTE — Progress Notes (Signed)
Musculoskeletal Exam  Patient: Daniel [name] DOB: January 31, 1956  DOS: 02/02/2022  SUBJECTIVE:  Chief Complaint:   Chief Complaint  Patient presents with   Daniel [name] is a 66 y.o.  male for evaluation and treatment of L side pain.   Onset:  happened a few hrs prior to arrival.  He was going to the restroom and fell, landing on the side on the hardwood.  He did not hit his head or lose consciousness. Location: L rib cage Character:  sharp  Progression of issue:  is unchanged Associated symptoms: pain w breathing No bruising, redness, swelling Treatment: to date has been none.   Neurovascular symptoms: no  Past Medical History:  Diagnosis Date   Anal fistula    recurrent   Benign localized prostatic hyperplasia with lower urinary tract symptoms (LUTS)    Bilateral lower extremity edema    feet   COPD with emphysema ([facility])    Diverticulosis of colon    ED (erectile dysfunction)    Generalized anxiety disorder    Hemorrhoids    History of colon polyps    History of diabetes mellitus, type II    10-10-2017  per pt no issues after wt loss surgery    History of pneumothorax    1983 & 1988  right spontaneous, treated w/ chest tube   Hyperlipidemia    Hypertension    MDD (major depressive disorder)    OSA (obstructive sleep apnea)    10-10-2017  not used cpap since 2016 after wt loss surgery 2015   Status post biliopancreatic diversion with duodenal switch    07-30-2013   @ [facility] for wt. loss surgery   Vitamin D deficiency    Wears glasses    Zinc deficiency 04/27/2016    Objective: VITAL SIGNS: BP 131/80 (BP Location: Right Arm, Patient Position: Sitting, Cuff Size: Normal)   Pulse 69   Temp 98 F (36.7 C) (Skin)   SpO2 97%  Constitutional: Well formed, well developed. No acute distress. Thorax & Lungs: No accessory muscle use Musculoskeletal: L side.   Tenderness to palpation: yes, over the anterior axillary line to the mid clavicular line around ribs 8  through 11 Deformity: no Ecchymosis: no  Neurologic: Normal sensory function.  Psychiatric: Normal mood. Age appropriate judgment and insight. Alert & oriented x 3.    Assessment:  Chest wall pain - Plan: DG Ribs Unilateral W/Chest Left, ketorolac (TORADOL) injection 60 mg  Plan: Low up balloons once per hour while awake to avoid atelectasis/pneumonia, heat, ice, Tylenol, NSAIDs.  Toradol today.  Patient reports oxycodone or like aspirin so we will do a short course of Dilaudid.  Unofficially, I do not appreciate any pneumothorax or displaced fractures.  Will await official radiology read. F/u as originally scheduled. The patient voiced understanding and agreement to the plan.   Jilda Roche [place], DO 02/02/22  8:55 AM

## 2022-02-05 ENCOUNTER — Other Ambulatory Visit: Payer: Self-pay | Admitting: Family Medicine

## 2022-02-06 MED ORDER — HYDROMORPHONE HCL 2 MG PO TABS: 1.0000 mg | ORAL_TABLET | Freq: Four times a day (QID) | ORAL | 0 refills | Status: DC | PRN

## 2022-02-12 ENCOUNTER — Ambulatory Visit (INDEPENDENT_AMBULATORY_CARE_PROVIDER_SITE_OTHER): Payer: Medicare Other | Admitting: Family Medicine

## 2022-02-12 ENCOUNTER — Encounter: Payer: Self-pay | Admitting: Family Medicine

## 2022-02-12 VITALS — BP 136/72 | HR 73 | Temp 98.1°F | Resp 16 | Ht 73.0 in | Wt 195.4 lb

## 2022-02-12 DIAGNOSIS — L0231 Cutaneous abscess of buttock: Secondary | ICD-10-CM

## 2022-02-12 DIAGNOSIS — L03317 Cellulitis of buttock: Secondary | ICD-10-CM | POA: Diagnosis not present

## 2022-02-12 MED ORDER — SULFAMETHOXAZOLE-TRIMETHOPRIM 800-160 MG PO TABS: 1.0000 | ORAL_TABLET | Freq: Two times a day (BID) | ORAL | 0 refills | Status: AC

## 2022-02-12 MED ORDER — CEPHALEXIN 500 MG PO CAPS: 500.0000 mg | ORAL_CAPSULE | Freq: Three times a day (TID) | ORAL | 0 refills | Status: AC

## 2022-02-12 NOTE — Patient Instructions (Signed)
If you do not hear anything about your referral in the next 1-2 weeks, call our office and ask for an update.  Ice/cold pack over area for 10-15 min twice daily.  OK to take Tylenol 1000 mg (2 extra strength tabs) or 975 mg (3 regular strength tabs) every 6 hours as needed.  When you do wash it, use only soap and water. Do not vigorously scrub. Keep the area clean and dry.   Things to look out for: increasing pain not relieved by ibuprofen/acetaminophen, fevers, spreading redness, increased drainage of pus, or foul odor.  Let us know if you need anything.

## 2022-02-12 NOTE — Progress Notes (Signed)
Chief Complaint  Patient presents with   Follow-up    Daniel [name] is a 66 y.o. male here for a skin complaint.  Duration: 3 days Location: R buttock Pruritic? No Painful? Yes Drainage? No Other associated symptoms: no fevers Therapies tried thus far: Doxycycline  Past Medical History:  Diagnosis Date   Anal fistula    recurrent   Benign localized prostatic hyperplasia with lower urinary tract symptoms (LUTS)    Bilateral lower extremity edema    feet   COPD with emphysema (HCC)    Diverticulosis of colon    ED (erectile dysfunction)    Generalized anxiety disorder    Hemorrhoids    History of colon polyps    History of diabetes mellitus, type II    10-10-2017  per pt no issues after wt loss surgery    History of pneumothorax    1983 & 1988  right spontaneous, treated w/ chest tube   Hyperlipidemia    Hypertension    MDD (major depressive disorder)    OSA (obstructive sleep apnea)    10-10-2017  not used cpap since 2016 after wt loss surgery 2015   Status post biliopancreatic diversion with duodenal switch    07-30-2013   @ [facility] for wt. loss surgery   Vitamin D deficiency    Wears glasses    Zinc deficiency 04/27/2016    BP 136/72   Pulse 73   Temp 98.1 F (36.7 C)   Resp 16   Ht 6\' 1"  (1.854 m)   Wt 195 lb 6.4 oz (88.6 kg)   SpO2 97%   BMI 25.78 kg/m  Gen: awake, alert, appearing stated age Lungs: No accessory muscle use Skin: There is a circular lesion on the medial right buttock near the anus that is not tender to palpation, measuring approximately 2.5 cm in diameter and has no overlying skin color changes.  Cephalad to that, there are 2 lesions.  1 has a central pustule with surrounding redness that is indurated and TTP.  There is no fluctuance or drainage from this lesion.  Lateral to that, there is a small opening with surrounding redness with pus actively draining from it.  Palpation of the 2 lesions do not cause drainage on the third.  This,  interestingly, is not tender to palpation. Psych: Age appropriate judgment and insight  Abscess and cellulitis of gluteal region - Plan: cephALEXin (KEFLEX) 500 MG capsule, sulfamethoxazole-trimethoprim (BACTRIM DS) 800-160 MG tablet, Ambulatory referral to General Surgery  Keflex and Bactrim.  Refer to general surgery if this does not work.  He may need some type of excision.  He has seen Dr. in the past so we will set him up with him. F/u prn. The patient voiced understanding and agreement to the plan.  Cliffton Asters [place], DO 02/12/22 2:06 PM

## 2022-02-23 ENCOUNTER — Other Ambulatory Visit: Payer: Self-pay | Admitting: Family Medicine

## 2022-02-26 ENCOUNTER — Ambulatory Visit (INDEPENDENT_AMBULATORY_CARE_PROVIDER_SITE_OTHER): Payer: Medicare Other | Admitting: Family Medicine

## 2022-02-26 ENCOUNTER — Encounter: Payer: Self-pay | Admitting: Family Medicine

## 2022-02-26 VITALS — BP 132/68 | HR 68 | Temp 98.0°F | Ht 73.0 in | Wt 188.0 lb

## 2022-02-26 DIAGNOSIS — R442 Other hallucinations: Secondary | ICD-10-CM | POA: Diagnosis not present

## 2022-02-26 MED ORDER — FLUTICASONE PROPIONATE 50 MCG/ACT NA SUSP: 2.0000 | Freq: Every day | NASAL | 6 refills | Status: DC

## 2022-02-26 NOTE — Patient Instructions (Signed)
If you do not hear anything about your referral in the next 1-2 weeks, call our office and ask for an update.  Flonase is available over the counter. Please compare prices.  Let us know if you need anything.

## 2022-02-26 NOTE — Progress Notes (Signed)
Chief Complaint  Patient presents with   followup on rib pain and smells    Subjective: Patient is a 66 y.o. male here for phantosmia.  Over the past couple weeks, the patient has been experiencing and menthol smell that comes intermittently.  He is asked other people to smell his clothing and be in his house and nobody has smelled it.  His taste is unchanged.  He denies any trauma, dental issues, tremors, weakness, or balance issues.  He has not started any new medication.  He denies any recent infection or nasal congestion.  Past Medical History:  Diagnosis Date   Anal fistula    recurrent   Benign localized prostatic hyperplasia with lower urinary tract symptoms (LUTS)    Bilateral lower extremity edema    feet   COPD with emphysema (HCC)    Diverticulosis of colon    ED (erectile dysfunction)    Generalized anxiety disorder    Hemorrhoids    History of colon polyps    History of diabetes mellitus, type II    10-10-2017  per pt no issues after wt loss surgery    History of pneumothorax    1983 & 1988  right spontaneous, treated w/ chest tube   Hyperlipidemia    Hypertension    MDD (major depressive disorder)    OSA (obstructive sleep apnea)    10-10-2017  not used cpap since 2016 after wt loss surgery 2015   Status post biliopancreatic diversion with duodenal switch    07-30-2013   @ [facility] for wt. loss surgery   Vitamin D deficiency    Wears glasses    Zinc deficiency 04/27/2016    Objective: BP 132/68 (BP Location: Left Arm, Patient Position: Sitting, Cuff Size: Normal)   Pulse 68   Temp 98 F (36.7 C) (Oral)   Ht 6\' 1"  (1.854 m)   Wt 188 lb (85.3 kg)   SpO2 97%   BMI 24.80 kg/m  General: Awake, appears stated age HEENT: MMM, no oral lesions or signs of dental disease; nares patent, turbinates are not edematous or pale, there is no septal deviation, I do not appreciate any nasal polyps Heart: RRR Lungs: CTAB, no rales, wheezes or rhonchi. No accessory muscle  use Neuro: 5/5 strength throughout, gait is normal, DTRs equal and symmetric throughout without clonus. Psych: Age appropriate judgment and insight, normal affect and mood  Assessment and Plan: Phantosmia - Plan: Ambulatory referral to ENT, fluticasone (FLONASE) 50 MCG/ACT nasal spray   Start Flonase daily.  Refer to ENT for further evaluation.  No obvious cause appreciated today. The patient voiced understanding and agreement to the plan.  [place], DO 02/26/22  9:35 AM

## 2022-02-27 ENCOUNTER — Telehealth: Payer: Self-pay | Admitting: Family Medicine

## 2022-02-27 NOTE — Telephone Encounter (Signed)
Called back and scheduled NV

## 2022-02-27 NOTE — Telephone Encounter (Signed)
Called left message to call back 

## 2022-02-27 NOTE — Telephone Encounter (Signed)
Patient called because he forgot to tell dr wendling some additional information yesterday. He said he is coughing up phlegm(clear to yellowish), sporadic headache , he said he already knows that his sense of smell Is off and smells like a chemical. He said he does experience a little dizziness as well. Patient said he would like a covid test before seeing the ENT doctor if Dr. Carmelia Roller thinks he should. He remembered Dr. Carmelia Roller telling him he should not rely on the home tests so that is why he is requesting one to be done here. Please call patient to advise if he should come in to get the test done.

## 2022-03-02 DIAGNOSIS — R439 Unspecified disturbances of smell and taste: Secondary | ICD-10-CM | POA: Diagnosis not present

## 2022-03-02 DIAGNOSIS — R442 Other hallucinations: Secondary | ICD-10-CM | POA: Diagnosis not present

## 2022-03-28 ENCOUNTER — Other Ambulatory Visit: Payer: Self-pay | Admitting: Family Medicine

## 2022-03-28 DIAGNOSIS — N529 Male erectile dysfunction, unspecified: Secondary | ICD-10-CM

## 2022-03-29 DIAGNOSIS — R438 Other disturbances of smell and taste: Secondary | ICD-10-CM | POA: Diagnosis not present

## 2022-03-29 DIAGNOSIS — R442 Other hallucinations: Secondary | ICD-10-CM | POA: Diagnosis not present

## 2022-04-04 ENCOUNTER — Other Ambulatory Visit: Payer: Self-pay | Admitting: Family Medicine

## 2022-04-09 ENCOUNTER — Other Ambulatory Visit: Payer: Self-pay | Admitting: Family Medicine

## 2022-04-09 DIAGNOSIS — L649 Androgenic alopecia, unspecified: Secondary | ICD-10-CM

## 2022-05-16 ENCOUNTER — Other Ambulatory Visit: Payer: Self-pay | Admitting: Family Medicine

## 2022-05-16 DIAGNOSIS — G2581 Restless legs syndrome: Secondary | ICD-10-CM

## 2022-05-31 ENCOUNTER — Other Ambulatory Visit: Payer: Self-pay | Admitting: Family Medicine

## 2022-06-06 ENCOUNTER — Other Ambulatory Visit: Payer: Self-pay | Admitting: Family Medicine

## 2022-06-11 ENCOUNTER — Other Ambulatory Visit: Payer: Self-pay | Admitting: Family Medicine

## 2022-06-11 DIAGNOSIS — K219 Gastro-esophageal reflux disease without esophagitis: Secondary | ICD-10-CM

## 2022-06-14 ENCOUNTER — Telehealth: Payer: Self-pay | Admitting: Family Medicine

## 2022-06-14 ENCOUNTER — Encounter: Payer: Self-pay | Admitting: Hematology and Oncology

## 2022-06-14 NOTE — Telephone Encounter (Signed)
Patient states the boils on his bottom are flaring up again and he need need a prescription of doxycicline to be sent to his pharmacy. Please advise.

## 2022-06-14 NOTE — Telephone Encounter (Signed)
Pt scheduled for Monday advised pt if boils get worse , go to ed or urgent care since we are closed for the holiday

## 2022-06-18 ENCOUNTER — Ambulatory Visit: Payer: Medicare Other | Admitting: Family Medicine

## 2022-06-19 ENCOUNTER — Ambulatory Visit (INDEPENDENT_AMBULATORY_CARE_PROVIDER_SITE_OTHER): Payer: Medicare Other | Admitting: Family Medicine

## 2022-06-19 ENCOUNTER — Encounter: Payer: Self-pay | Admitting: Family Medicine

## 2022-06-19 VITALS — BP 128/80 | HR 76 | Temp 97.8°F | Ht 73.0 in | Wt 192.1 lb

## 2022-06-19 DIAGNOSIS — L0232 Furuncle of buttock: Secondary | ICD-10-CM

## 2022-06-19 MED ORDER — DOXYCYCLINE HYCLATE 100 MG PO TABS: 100.0000 mg | ORAL_TABLET | Freq: Two times a day (BID) | ORAL | 0 refills | Status: DC

## 2022-06-19 MED ORDER — ROPINIROLE HCL 1 MG PO TABS: ORAL_TABLET | ORAL | 3 refills | Status: DC

## 2022-06-19 NOTE — Patient Instructions (Signed)
Doxy should be used for 7 days for future flares.   This current "outbreak" looks great currently.   Let us know if you need anything.

## 2022-06-19 NOTE — Progress Notes (Signed)
Chief Complaint  Patient presents with   check boils    Daniel [name] is a 67 y.o. male here for a skin complaint.  Duration: 1 week Location: R buttock Pruritic? No Painful? Yes Drainage? Yes New soaps/lotions/topicals/detergents? No + History of recurrent boils Other associated symptoms: Redness; no fevers Therapies tried thus far: doxycycline  Past Medical History:  Diagnosis Date   Anal fistula    recurrent   Benign localized prostatic hyperplasia with lower urinary tract symptoms (LUTS)    Bilateral lower extremity edema    feet   COPD with emphysema    Diverticulosis of colon    ED (erectile dysfunction)    Generalized anxiety disorder    Hemorrhoids    History of colon polyps    History of diabetes mellitus, type II    10-10-2017  per pt no issues after wt loss surgery    History of pneumothorax    1983 & 1988  right spontaneous, treated w/ chest tube   Hyperlipidemia    Hypertension    MDD (major depressive disorder)    OSA (obstructive sleep apnea)    10-10-2017  not used cpap since 2016 after wt loss surgery 2015   Status post biliopancreatic diversion with duodenal switch    07-30-2013   @ [facility] for wt. loss surgery   Vitamin D deficiency    Wears glasses    Zinc deficiency 04/27/2016    BP 128/80 (BP Location: Left Arm, Patient Position: Sitting, Cuff Size: Normal)   Pulse 76   Temp 97.8 F (36.6 C) (Oral)   Ht 6\' 1"  (1.854 m)   Wt 192 lb 2 oz (87.1 kg)   SpO2 96%   BMI 25.35 kg/m  Gen: awake, alert, appearing stated age Lungs: No accessory muscle use Skin: Indurated area of pinkish/purple hue over the left medial buttock.  There is a central excoriation without drainage, erythema, TTP, fluctuance. Psych: Age appropriate judgment and insight  Furuncle of buttock  Doxycycline to complete the course sent in.  No need for I&D today.  Warning signs and symptoms verbalized.  This is not new to him. F/u as originally scheduled for his physical. The  patient voiced understanding and agreement to the plan.  [place], DO 06/19/22 4:47 PM

## 2022-06-29 ENCOUNTER — Encounter: Payer: Medicare Other | Admitting: Family Medicine

## 2022-07-09 ENCOUNTER — Encounter: Payer: Self-pay | Admitting: Hematology and Oncology

## 2022-07-16 DIAGNOSIS — F411 Generalized anxiety disorder: Secondary | ICD-10-CM | POA: Diagnosis not present

## 2022-07-16 DIAGNOSIS — Z79899 Other long term (current) drug therapy: Secondary | ICD-10-CM | POA: Diagnosis not present

## 2022-07-16 DIAGNOSIS — Z87898 Personal history of other specified conditions: Secondary | ICD-10-CM | POA: Diagnosis not present

## 2022-07-16 DIAGNOSIS — F330 Major depressive disorder, recurrent, mild: Secondary | ICD-10-CM | POA: Diagnosis not present

## 2022-07-17 ENCOUNTER — Encounter: Payer: Self-pay | Admitting: Podiatry

## 2022-07-17 ENCOUNTER — Ambulatory Visit: Payer: Medicare Other | Admitting: Podiatry

## 2022-07-17 DIAGNOSIS — B070 Plantar wart: Secondary | ICD-10-CM | POA: Diagnosis not present

## 2022-07-17 MED ORDER — SALICYLIC ACID 40 % EX PADS: 1.0000 | MEDICATED_PAD | Freq: Every day | CUTANEOUS | 1 refills | Status: AC | PRN

## 2022-07-17 NOTE — Progress Notes (Signed)
Chief Complaint  Patient presents with   Callouses    Patient came in today for a right foot callus, started 4 months ago, Rate of pain 5 out of 10,     Subjective:  67 y.o. male presenting today for new complaint of pain and tenderness associated to the plantar aspect of the fifth MTP of the right foot this been going on for few months now.  Idiopathic onset.  He says it is very painful and tender to walk on.  He has not done anything for treatment  Past Medical History:  Diagnosis Date   Anal fistula    recurrent   Benign localized prostatic hyperplasia with lower urinary tract symptoms (LUTS)    Bilateral lower extremity edema    feet   COPD with emphysema (HCC)    Diverticulosis of colon    ED (erectile dysfunction)    Generalized anxiety disorder    Hemorrhoids    History of colon polyps    History of diabetes mellitus, type II    10-10-2017  per pt no issues after wt loss surgery    History of pneumothorax    1983 & 1988  right spontaneous, treated w/ chest tube   Hyperlipidemia    Hypertension    MDD (major depressive disorder)    OSA (obstructive sleep apnea)    10-10-2017  not used cpap since 2016 after wt loss surgery 2015   Status post biliopancreatic diversion with duodenal switch    07-30-2013   @ [facility] for wt. loss surgery   Vitamin D deficiency    Wears glasses    Zinc deficiency 04/27/2016     Objective / Physical Exam:  General:  The patient is alert and oriented x3 in no acute distress. Dermatology:  Skin is warm, dry and supple bilateral lower extremities.  Hyperkeratotic plantar verruca noted to the fifth MTP of the right foot with pinpoint bleeding upon debridement. Vascular:  Palpable pedal pulses bilaterally.  Minimal chronic edema noted left lower extremity. Capillary refill within normal limits. Neurological:  Epicritic and protective threshold grossly intact bilaterally.  Musculoskeletal Exam:  No pedal deformity.  Associated tenderness to  palpation noted to the fifth MTP of the right foot around the verruca lesion  Radiographic Exam 07/05/2021 LT ankle:  Normal osseous mineralization. Joint spaces preserved. No fracture/dislocation/boney destruction.    MR ANKLE LEFT WO CONTRAST 07/17/2022: IMPRESSION: 1. Acute to subacute nondisplaced fracture of the superior aspect of the calcaneal body, contacting the superior articular surface posterior of the posterior subtalar joint and also extending to the lateral calcaneal surface with high-grade surrounding marrow edema. 2. 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  Assessment: 1.  Plantar wart fifth MTP right  -Patient evaluated -Excisional debridement of the hyperkeratotic callus tissue was performed today using a 312 scalpel.  Pinpoint bleeding upon debridement.  Salinocaine was applied with a light dressing.  Post care instructions were provided -Offloading felt dancers pads were applied to the plantar aspect of the foot to offload pressure from the fifth MTP -Prescription for 40% salicylic acid topical external patch sent to the pharmacy per patient's request to use as needed -Return to clinic as needed   Felecia Shelling, DPM [facility]  Dr. Felecia Shelling, DPM    [address]  [place], [place] [address]                Office [phone]  Fax [phone]

## 2022-07-20 ENCOUNTER — Other Ambulatory Visit: Payer: Self-pay | Admitting: Family Medicine

## 2022-07-24 DIAGNOSIS — E291 Testicular hypofunction: Secondary | ICD-10-CM | POA: Diagnosis not present

## 2022-07-31 ENCOUNTER — Encounter: Payer: Self-pay | Admitting: Family Medicine

## 2022-07-31 ENCOUNTER — Ambulatory Visit (INDEPENDENT_AMBULATORY_CARE_PROVIDER_SITE_OTHER): Payer: Medicare Other | Admitting: Family Medicine

## 2022-07-31 VITALS — BP 128/85 | HR 72 | Temp 97.9°F | Ht 73.0 in | Wt 195.0 lb

## 2022-07-31 DIAGNOSIS — E559 Vitamin D deficiency, unspecified: Secondary | ICD-10-CM

## 2022-07-31 DIAGNOSIS — Z136 Encounter for screening for cardiovascular disorders: Secondary | ICD-10-CM

## 2022-07-31 DIAGNOSIS — E60 Dietary zinc deficiency: Secondary | ICD-10-CM | POA: Diagnosis not present

## 2022-07-31 DIAGNOSIS — I10 Essential (primary) hypertension: Secondary | ICD-10-CM | POA: Diagnosis not present

## 2022-07-31 DIAGNOSIS — Z79899 Other long term (current) drug therapy: Secondary | ICD-10-CM | POA: Diagnosis not present

## 2022-07-31 DIAGNOSIS — E538 Deficiency of other specified B group vitamins: Secondary | ICD-10-CM

## 2022-07-31 DIAGNOSIS — Z9884 Bariatric surgery status: Secondary | ICD-10-CM | POA: Diagnosis not present

## 2022-07-31 DIAGNOSIS — Z0000 Encounter for general adult medical examination without abnormal findings: Secondary | ICD-10-CM

## 2022-07-31 DIAGNOSIS — E118 Type 2 diabetes mellitus with unspecified complications: Secondary | ICD-10-CM

## 2022-07-31 LAB — IBC + FERRITIN
Ferritin: 7.4 ng/mL — ABNORMAL LOW (ref 22.0–322.0)
Iron: 72 ug/dL (ref 42–165)
Saturation Ratios: 14.6 % — ABNORMAL LOW (ref 20.0–50.0)
TIBC: 492.8 ug/dL — ABNORMAL HIGH (ref 250.0–450.0)
Transferrin: 352 mg/dL (ref 212.0–360.0)

## 2022-07-31 LAB — LIPID PANEL
Cholesterol: 132 mg/dL (ref 0–200)
HDL: 34.8 mg/dL — ABNORMAL LOW (ref >39.00)
LDL Cholesterol: 83 mg/dL (ref 0–99)
NonHDL: 97.18
Total CHOL/HDL Ratio: 4
Triglycerides: 71 mg/dL (ref 0.0–149.0)
VLDL: 14.2 mg/dL (ref 0.0–40.0)

## 2022-07-31 LAB — CBC
HCT: 44.2 % (ref 39.0–52.0)
Hemoglobin: 14.7 g/dL (ref 13.0–17.0)
MCHC: 33.2 g/dL (ref 30.0–36.0)
MCV: 89.8 fl (ref 78.0–100.0)
Platelets: 199 10*3/uL (ref 150.0–400.0)
RBC: 4.92 Mil/uL (ref 4.22–5.81)
RDW: 15.4 % (ref 11.5–15.5)
WBC: 5.3 10*3/uL (ref 4.0–10.5)

## 2022-07-31 LAB — COMPREHENSIVE METABOLIC PANEL
ALT: 29 U/L (ref 0–53)
AST: 29 U/L (ref 0–37)
Albumin: 3.7 g/dL (ref 3.5–5.2)
Alkaline Phosphatase: 324 U/L — ABNORMAL HIGH (ref 39–117)
BUN: 8 mg/dL (ref 6–23)
CO2: 27 mEq/L (ref 19–32)
Calcium: 7.8 mg/dL — ABNORMAL LOW (ref 8.4–10.5)
Chloride: 108 mEq/L (ref 96–112)
Creatinine, Ser: 0.85 mg/dL (ref 0.40–1.50)
GFR: 90.7 mL/min (ref >60.00)
Glucose, Bld: 92 mg/dL (ref 70–99)
Potassium: 4.2 mEq/L (ref 3.5–5.1)
Sodium: 141 mEq/L (ref 135–145)
Total Bilirubin: 0.5 mg/dL (ref 0.2–1.2)
Total Protein: 6 g/dL (ref 6.0–8.3)

## 2022-07-31 LAB — FOLATE: Folate: 8 ng/mL (ref >5.9)

## 2022-07-31 LAB — VITAMIN D 25 HYDROXY (VIT D DEFICIENCY, FRACTURES): VITD: <7 ng/mL — ABNORMAL LOW (ref 30.00–100.00)

## 2022-07-31 LAB — VITAMIN B12: Vitamin B-12: 488 pg/mL (ref 211–911)

## 2022-07-31 LAB — HEMOGLOBIN A1C: Hgb A1c MFr Bld: 5.8 % (ref 4.6–6.5)

## 2022-07-31 MED ORDER — DOXYCYCLINE HYCLATE 100 MG PO TABS: 100.0000 mg | ORAL_TABLET | Freq: Two times a day (BID) | ORAL | 0 refills | Status: DC

## 2022-07-31 NOTE — Patient Instructions (Signed)
Give us 2-3 business days to get the results of your labs back.   Keep the diet clean and stay active.  Let us know if you need anything. 

## 2022-07-31 NOTE — Progress Notes (Signed)
Chief Complaint  Patient presents with   Annual Exam    Well Male Daniel [name] is here for a complete physical.   His last physical was >1 year ago.  Current diet: in general, a "healthy" diet.   Current exercise: none currently  Weight trend: stable Fatigue out of ordinary? Yes Seat belt? No.   Advanced directive? Yes  Health maintenance Shingrix- Yes Colonoscopy- Yes Tetanus- Yes Hep C- Yes Lung cancer screening- Yes Pneumonia vaccine- Yes AAA screening- No  Past Medical History:  Diagnosis Date   Anal fistula    recurrent   Benign localized prostatic hyperplasia with lower urinary tract symptoms (LUTS)    Bilateral lower extremity edema    feet   COPD with emphysema (HCC)    Diverticulosis of colon    ED (erectile dysfunction)    Generalized anxiety disorder    Hemorrhoids    History of colon polyps    History of diabetes mellitus, type II    10-10-2017  per pt no issues after wt loss surgery    History of pneumothorax    1983 & 1988  right spontaneous, treated w/ chest tube   Hyperlipidemia    Hypertension    MDD (major depressive disorder)    OSA (obstructive sleep apnea)    10-10-2017  not used cpap since 2016 after wt loss surgery 2015   Status post biliopancreatic diversion with duodenal switch    07-30-2013   @ [facility] for wt. loss surgery   Vitamin D deficiency    Wears glasses    Zinc deficiency 04/27/2016     Past Surgical History:  Procedure Laterality Date   ANAL FISTULOTOMY N/A 08/07/2016   Procedure: ANAL FISTULOTOMY;  Surgeon: Abigail Miyamoto, MD;  Location: [facility] OR;  Service: General;  Laterality: N/A;   CHEST TUBE INSERTION  1988   right lung for spontaneous pneumothorax   COLONOSCOPY W/ BIOPSIES  08/02/2016   EXOSTECTECTOMY TOE Left 04/25/2017   Procedure: TARSAL EXOSTECTOMY OF MEDIAL CUNEIFORM AND EXOSTECTOMY OF THE FIRST METATARSAL;  Surgeon: Felecia Shelling, DPM;  Location: [facility] OR;  Service: Podiatry;  Laterality: Left;   GASTROPLASTY  DUODENAL SWITCH  07-30-2013    @[facility]    "wt loss surgery not gastric bypass surgery" (biliopancreatic diversion w/ duodenal switch)   HEMORRHOID SURGERY N/A 08/07/2016   Procedure: HEMORRHOIDECTOMY;  Surgeon: Abigail Miyamoto, MD;  Location: [facility] OR;  Service: General;  Laterality: N/A;   INCISION AND DRAINAGE ABSCESS N/A 10/14/2017   Procedure: POSSIBLE INCISION AND DRAINAGE OF ANAL ABSCESS;  Surgeon: Andria Meuse, MD;  Location: [facility];  Service: General;  Laterality: N/A;   INGUINAL HERNIA REPAIR Left 04/10/2017   Procedure: LAPAROSCOPIC LEFT INGUINAL HERNIA REPAIR WITH MESH;  Surgeon: Abigail Miyamoto, MD;  Location: [facility];  Service: General;  Laterality: Left;   INSERTION OF MESH Left 04/10/2017   Procedure: INSERTION OF MESH;  Surgeon: Abigail Miyamoto, MD;  Location: [facility];  Service: General;  Laterality: Left;   IRRIGATION AND DEBRIDEMENT SEBACEOUS CYST     back   MASS EXCISION N/A 01/29/2018   Procedure: EXCISION of lesion on scalp;  Surgeon: Peggye Form, DO;  Location:  SURGERY CENTER;  Service: Plastics;  Laterality: N/A;   RECTAL EXAM UNDER ANESTHESIA N/A 10/14/2017   Procedure: ANAL EXAM UNDER ANESTHESIA, INJECTION OF FIBRIN GLUE;  Surgeon: Andria Meuse, MD;  Location: [facility];  Service: General;  Laterality: N/A;  Medications  Current Outpatient Medications on File Prior to Visit  Medication Sig Dispense Refill   albuterol (VENTOLIN HFA) 108 (90 Base) MCG/ACT inhaler TAKE 2 PUFFS BY MOUTH 10-15 MINUTES PRIOR TO EXERCISE 18 g 2   amphetamine-dextroamphetamine (ADDERALL) 20 MG tablet Take 1 tablet (20 mg total) by mouth 2 (two) times daily. 60 tablet 0   buPROPion (WELLBUTRIN XL) 150 MG 24 hr tablet Take 1 tablet (150 mg total) by mouth 3 (three) times daily. 270 tablet 0   Cholecalciferol (VITAMIN D3) 1000 units CAPS Take 1,000 Units by mouth daily.     clobetasol  (TEMOVATE) 0.05 % external solution APPLY TO AFFECTED AREAS ON SCALP DAILY AS DIRECTED AS NEEDED FOR ITCHING 50 mL 1   diazepam (VALIUM) 5 MG tablet TAKE ONE TO TWO TABLETS BY MOUTH EVERY 8 HOURS AS NEEDED FOR ANXIETY 90 tablet 2   DULoxetine (CYMBALTA) 30 MG capsule TAKE 3 CAPSULES BY MOUTH EVERY DAY 270 capsule 2   finasteride (PROPECIA) 1 MG tablet TAKE 1 TABLET BY MOUTH DAILY 30 tablet 1   fluticasone (FLONASE) 50 MCG/ACT nasal spray Place 2 sprays into both nostrils daily. 16 g 6   folic acid (FOLVITE) 400 MCG tablet      lisinopril (ZESTRIL) 2.5 MG tablet TAKE 1 TABLET BY MOUTH DAILY 90 tablet 0   nebivolol (BYSTOLIC) 10 MG tablet TAKE 1 TABLET BY MOUTH TWICE A DAY 60 tablet 2   pantoprazole (PROTONIX) 40 MG tablet TAKE 1 TABLET BY MOUTH DAILY 90 tablet 1   pregabalin (LYRICA) 75 MG capsule TAKE ONE CAPSULE BY MOUTH EVERY NIGHT AT BEDTIME AS NEEDED 30 capsule 5   rOPINIRole (REQUIP) 1 MG tablet Take 2 tabs nightly. 180 tablet 3   Salicylic Acid 40 % PADS Apply 1 Application topically daily as needed. 48 each 1   sildenafil (VIAGRA) 100 MG tablet TAKE ONE TABLET BY MOUTH DAILY AS NEEDED FOR ERECTILE DYSFUNCTION 30 tablet 7   tamsulosin (FLOMAX) 0.4 MG CAPS capsule Take 1 capsule (0.4 mg total) by mouth daily after supper. 90 capsule 0   testosterone cypionate (DEPO-TESTOSTERONE) 200 MG/ML injection Inject 1 mL (200 mg total) into the muscle every 14 (fourteen) days. 10 mL 0   VITAMIN A PO Take 2,400 Units by mouth daily.     Vitamin D, Ergocalciferol, (DRISDOL) 1.25 MG (50000 UNIT) CAPS capsule TAKE 1 CAPSULE BY MOUTH EVERY 7 DAYS 12 capsule 1   vitamin E 200 UNIT capsule Take 200 Units by mouth daily.     vitamin k 100 MCG tablet      Zinc 50 MG CAPS Take 1 capsule by mouth daily.     zinc gluconate 50 MG tablet Take 100 mg by mouth daily.     furosemide (LASIX) 20 MG tablet One by mouth once a day as needed for swelling 30 tablet 1   No current facility-administered medications on file  prior to visit.     Allergies No Known Allergies  Family History Family History  Problem Relation Age of Onset   Cancer Mother        ovarian   Hyperlipidemia Father    Hypertension Father    Heart disease Father        MI 7/19   Emphysema Father    Pulmonary disease Father    Heart disease Maternal Grandmother    Heart disease Maternal Grandfather    Stroke Maternal Grandfather    Heart disease Paternal Grandmother    Angina Paternal  Grandmother    Heart disease Paternal Grandfather    Stroke Paternal Grandfather    Diabetes Maternal Uncle    Kidney disease Maternal Uncle    Diabetes Paternal Uncle    Cancer Paternal Uncle    Neuropathy Neg Hx     Review of Systems: Constitutional:  no fevers Eye:  no recent significant change in vision Ears:  No changes in hearing Nose/Mouth/Throat:  no complaints of nasal congestion, no sore throat Cardiovascular: no chest pain Respiratory:  No shortness of breath Gastrointestinal:  No change in bowel habits GU:  No frequency Integumentary:  no abnormal skin lesions reported Neurologic:  no headaches Endocrine:  denies unexplained weight changes  Exam BP 128/85 (BP Location: Left Arm, Patient Position: Sitting, Cuff Size: Normal)   Pulse 72   Temp 97.9 F (36.6 C) (Oral)   Ht 6\' 1"  (1.854 m)   Wt 195 lb (88.5 kg)   SpO2 95%   BMI 25.73 kg/m  General:  well developed, well nourished, in no apparent distress Skin:  no significant moles, warts, or growths Head:  no masses, lesions, or tenderness Eyes:  pupils equal and round, sclera anicteric without injection Ears:  canals without lesions, TMs shiny without retraction, no obvious effusion, no erythema Nose:  nares patent, mucosa normal Throat/Pharynx:  lips and gingiva without lesion; tongue and uvula midline; non-inflamed pharynx; no exudates or postnasal drainage Lungs:  clear to auscultation, breath sounds equal bilaterally, no respiratory distress Cardio:  regular  rate and rhythm, no LE edema or bruits Rectal: Deferred GI: BS+, S, NT, ND, no masses or organomegaly Musculoskeletal:  symmetrical muscle groups noted without atrophy or deformity Neuro:  gait normal; deep tendon reflexes normal and symmetric Psych: well oriented with normal range of affect and appropriate judgment/insight  Assessment and Plan  Well adult exam  Encounter for long-term (current) use of high-risk medication - Plan: Drug Monitoring Panel [phone] , Urine  Essential hypertension - Plan: B12  Type 2 diabetes mellitus with complication, without long-term current use of insulin (HCC) - Plan: CBC, Comprehensive metabolic panel, Lipid panel, Microalbumin / creatinine urine ratio, Hemoglobin A1c  Vitamin D deficiency - Plan: VITAMIN D 25 Hydroxy (Vit-D Deficiency, Fractures)  Zinc deficiency - Plan: Zinc, IBC + Ferritin  Screening for AAA (abdominal aortic aneurysm) - Plan: US AORTA MEDICARE SCREENING  Low folate - Plan: Folate  History of bariatric surgery - Plan: IBC + Ferritin, Vitamin B1   Well 67 y.o. male. Counseled on diet and exercise. UDS and CSC updated today.  Ck above labs. Other orders as above. Follow up in 6 mo.  The patient voiced understanding and agreement to the plan.  Jilda Roche [place], DO 07/31/22 10:11 AM

## 2022-08-03 ENCOUNTER — Other Ambulatory Visit: Payer: Self-pay | Admitting: Family Medicine

## 2022-08-03 ENCOUNTER — Encounter: Payer: Self-pay | Admitting: Family Medicine

## 2022-08-03 ENCOUNTER — Telehealth: Payer: Self-pay | Admitting: *Deleted

## 2022-08-03 DIAGNOSIS — Z9884 Bariatric surgery status: Secondary | ICD-10-CM

## 2022-08-03 DIAGNOSIS — E538 Deficiency of other specified B group vitamins: Secondary | ICD-10-CM

## 2022-08-03 DIAGNOSIS — E559 Vitamin D deficiency, unspecified: Secondary | ICD-10-CM

## 2022-08-03 LAB — VITAMIN B1: Vitamin B1 (Thiamine): 210 nmol/L — ABNORMAL HIGH (ref 8–30)

## 2022-08-03 MED ORDER — VITAMIN D (ERGOCALCIFEROL) 1.25 MG (50000 UNIT) PO CAPS: ORAL_CAPSULE | ORAL | 1 refills | Status: DC

## 2022-08-03 NOTE — Telephone Encounter (Signed)
Pt had OV on 5/14.  UDS and microalbumin were ordered.  Only UDS was collected.  Do you want [place] to call pt to return for microalbumin or do you want to collect it at future appointment?

## 2022-08-06 ENCOUNTER — Ambulatory Visit (HOSPITAL_BASED_OUTPATIENT_CLINIC_OR_DEPARTMENT_OTHER)
Admission: RE | Admit: 2022-08-06 | Discharge: 2022-08-06 | Disposition: A | Discharge status: Home / Self Care | Payer: Medicare Other | Source: Ambulatory Visit | Attending: Family Medicine | Admitting: Family Medicine

## 2022-08-06 DIAGNOSIS — Z136 Encounter for screening for cardiovascular disorders: Secondary | ICD-10-CM

## 2022-08-06 DIAGNOSIS — Z87891 Personal history of nicotine dependence: Secondary | ICD-10-CM | POA: Diagnosis not present

## 2022-08-06 LAB — ZINC: Zinc: 55 ug/dL — ABNORMAL LOW (ref 60–130)

## 2022-08-07 LAB — DRUG MONITORING PANEL 376104, URINE
Alphahydroxyalprazolam: NEGATIVE ng/mL
Alphahydroxymidazolam: NEGATIVE ng/mL
Alphahydroxytriazolam: NEGATIVE ng/mL
Aminoclonazepam: NEGATIVE ng/mL
Amphetamine: 3616 ng/mL — ABNORMAL HIGH
Amphetamines: POSITIVE ng/mL — AB
Barbiturates: NEGATIVE ng/mL
Benzodiazepines: POSITIVE ng/mL — AB
Cocaine Metabolite: NEGATIVE ng/mL
Desmethyltramadol: NEGATIVE ng/mL
Hydroxyethylflurazepam: NEGATIVE ng/mL
Lorazepam: NEGATIVE ng/mL
Methamphetamine: NEGATIVE ng/mL
Nordiazepam: 769 ng/mL — ABNORMAL HIGH
Opiates: NEGATIVE ng/mL
Oxazepam: 1752 ng/mL — ABNORMAL HIGH
Oxycodone: NEGATIVE ng/mL
Temazepam: 1142 ng/mL — ABNORMAL HIGH
Tramadol: NEGATIVE ng/mL

## 2022-08-07 LAB — DM TEMPLATE

## 2022-08-14 ENCOUNTER — Other Ambulatory Visit: Payer: Self-pay | Admitting: Family Medicine

## 2022-08-14 DIAGNOSIS — L649 Androgenic alopecia, unspecified: Secondary | ICD-10-CM

## 2022-08-17 ENCOUNTER — Other Ambulatory Visit: Payer: Self-pay | Admitting: Family Medicine

## 2022-08-17 NOTE — Telephone Encounter (Signed)
Last OV---07/31/2022 RF----#90 with 2 refills.

## 2022-08-18 ENCOUNTER — Other Ambulatory Visit: Payer: Self-pay | Admitting: Family Medicine

## 2022-08-20 ENCOUNTER — Other Ambulatory Visit: Payer: Self-pay | Admitting: Family Medicine

## 2022-08-20 ENCOUNTER — Ambulatory Visit (INDEPENDENT_AMBULATORY_CARE_PROVIDER_SITE_OTHER): Payer: Medicare Other | Admitting: Family Medicine

## 2022-08-20 ENCOUNTER — Encounter: Payer: Self-pay | Admitting: Family Medicine

## 2022-08-20 VITALS — BP 138/82 | HR 86 | Temp 98.3°F | Ht 73.0 in | Wt 198.0 lb

## 2022-08-20 DIAGNOSIS — R748 Abnormal levels of other serum enzymes: Secondary | ICD-10-CM

## 2022-08-20 DIAGNOSIS — M79661 Pain in right lower leg: Secondary | ICD-10-CM

## 2022-08-20 DIAGNOSIS — E60 Dietary zinc deficiency: Secondary | ICD-10-CM | POA: Diagnosis not present

## 2022-08-20 DIAGNOSIS — G8929 Other chronic pain: Secondary | ICD-10-CM

## 2022-08-20 DIAGNOSIS — M25562 Pain in left knee: Secondary | ICD-10-CM

## 2022-08-20 DIAGNOSIS — M25561 Pain in right knee: Secondary | ICD-10-CM | POA: Diagnosis not present

## 2022-08-20 LAB — IBC + FERRITIN
Ferritin: 7.1 ng/mL — ABNORMAL LOW (ref 22.0–322.0)
Iron: 53 ug/dL (ref 42–165)
Saturation Ratios: 10.3 % — ABNORMAL LOW (ref 20.0–50.0)
TIBC: 516.6 ug/dL — ABNORMAL HIGH (ref 250.0–450.0)
Transferrin: 369 mg/dL — ABNORMAL HIGH (ref 212.0–360.0)

## 2022-08-20 LAB — HEPATIC FUNCTION PANEL
ALT: 26 U/L (ref 0–53)
AST: 25 U/L (ref 0–37)
Albumin: 3.7 g/dL (ref 3.5–5.2)
Alkaline Phosphatase: 337 U/L — ABNORMAL HIGH (ref 39–117)
Bilirubin, Direct: 0.1 mg/dL (ref 0.0–0.3)
Total Bilirubin: 0.5 mg/dL (ref 0.2–1.2)
Total Protein: 6 g/dL (ref 6.0–8.3)

## 2022-08-20 MED ORDER — DIAZEPAM 5 MG PO TABS: 5.0000 mg | ORAL_TABLET | Freq: Two times a day (BID) | ORAL | 2 refills | Status: DC | PRN

## 2022-08-20 MED ORDER — ZOLPIDEM TARTRATE 10 MG PO TABS: 10.0000 mg | ORAL_TABLET | Freq: Every evening | ORAL | 1 refills | Status: DC | PRN

## 2022-08-20 NOTE — Telephone Encounter (Signed)
Last OV---07/31/2022 Last RF---01/22/2022--#90 with 2 Refills

## 2022-08-20 NOTE — Progress Notes (Signed)
Musculoskeletal Exam  Patient: Daniel [name] DOB: 21-Jul-1955  DOS: 08/20/2022  SUBJECTIVE:  Chief Complaint:   Chief Complaint  Patient presents with   Follow-up    Daniel [name] is a 67 y.o.  male for evaluation and treatment of bilateral knee pain.   Onset:  6 months ago. No inj or change in activity.  Location: around knee caps Character:  aching and sharp  Progression of issue:  is unchanged Associated symptoms: hurts when he rises from seated position No redness, bruising, fevers, swelling Treatment: to date has been none.   Neurovascular symptoms: no  R shin pain for 2 d. No inj or change in activity.  He has a history of shinsplints but does not exercise routinely.  He is able to walk without difficulty. No redness, bruising, swelling, calf pain.  No neurologic signs or symptoms.  Past Medical History:  Diagnosis Date   Anal fistula    recurrent   Benign localized prostatic hyperplasia with lower urinary tract symptoms (LUTS)    Bilateral lower extremity edema    feet   COPD with emphysema (HCC)    Diverticulosis of colon    ED (erectile dysfunction)    Generalized anxiety disorder    Hemorrhoids    History of colon polyps    History of diabetes mellitus, type II    10-10-2017  per pt no issues after wt loss surgery    History of pneumothorax    1983 & 1988  right spontaneous, treated w/ chest tube   Hyperlipidemia    Hypertension    MDD (major depressive disorder)    OSA (obstructive sleep apnea)    10-10-2017  not used cpap since 2016 after wt loss surgery 2015   Status post biliopancreatic diversion with duodenal switch    07-30-2013   @ [facility] for wt. loss surgery   Vitamin D deficiency    Wears glasses    Zinc deficiency 04/27/2016    Objective: VITAL SIGNS: BP 138/82 (BP Location: Left Arm, Patient Position: Sitting, Cuff Size: Normal)   Pulse 86   Temp 98.3 F (36.8 C) (Oral)   Ht 6\' 1"  (1.854 m)   Wt 198 lb (89.8 kg)   SpO2 96%   BMI 26.12  kg/m  Constitutional: Well formed, well developed. No acute distress. Thorax & Lungs: No accessory muscle use Musculoskeletal:.  knees Normal active range of motion: yes.   Normal passive range of motion: yes Tenderness to palpation: no Deformity: no Ecchymosis: no Tests positive: None Tests negative: Lachman's, McMurray's, Stines, varus/valgus stress, patellar apprehension/grind There is TTP over the right tibia without deformity, ecchymosis, or edema. Neurologic: Normal sensory function. No focal deficits noted. DTR's equal and symmetric in LE's. No clonus. Psychiatric: Normal mood. Age appropriate judgment and insight. Alert & oriented x 3.    Assessment:  Chronic pain of both knees  Pain in right shin  Zinc deficiency - Plan: Zinc  Elevated alkaline phosphatase level - Plan: Alkaline Phosphatase Isoenzymes, IBC + Ferritin, Hepatic function panel  Hypocalcemia - Plan: Vitamin B1, PTH, Intact and Calcium  Plan: Stretches/exercises, heat, ice, Tylenol.  Physical therapy if no better in the next 3 to 4 weeks.  Suspect patellar maltracking. Ice, heat, Tylenol.  Vitamin D repletion as this could be related to that. Will recheck zinc levels, if still low he will double his oral supplementation. He has a history of an elevated alk phos which was worked up, including a bone scan.  His workup  was unremarkable.  Most recent level was a sharp deviation from his baseline. Follow-up with a PTH level. F/u as originally scheduled. The patient voiced understanding and agreement to the plan.  I spent 30 minutes with the patient discussing the above plans in addition to reviewing his chart on the same day of the visit.   Jilda Roche [place], DO 08/20/22  12:15 PM

## 2022-08-20 NOTE — Patient Instructions (Signed)
Give [place] 2-3 business days to get the results of your labs back.   Ice/cold pack over area for 10-15 min twice daily.  Heat (pad or rice pillow in microwave) over affected area, 10-15 minutes twice daily.   Send me a message in 3-4 weeks if no better with the knees. Send me a message in 6-8 weeks if the shin is no better.   OK to take Tylenol 1000 mg (2 extra strength tabs) or 975 mg (3 regular strength tabs) every 6 hours as needed.  Let us know if you need anything.  Knee Exercises It is normal to feel mild stretching, pulling, tightness, or discomfort as you do these exercises, but you should stop right away if you feel sudden pain or your pain gets worse.  STRETCHING AND RANGE OF MOTION EXERCISES  These exercises warm up your muscles and joints and improve the movement and flexibility of your knee. These exercises also help to relieve pain, numbness, and tingling. Exercise A: Knee Extension, Prone  Lie on your abdomen on a bed. Place your left / right knee just beyond the edge of the surface so your knee is not on the bed. You can put a towel under your left / right thigh just above your knee for comfort. Relax your leg muscles and allow gravity to straighten your knee. You should feel a stretch behind your left / right knee. Hold this position for 30 seconds. Scoot up so your knee is supported between repetitions. Repeat 2 times. Complete this stretch 3 times per week. Exercise B: Knee Flexion, Active     Lie on your back with both knees straight. If this causes back discomfort, bend your left / right knee so your foot is flat on the floor. Slowly slide your left / right heel back toward your buttocks until you feel a gentle stretch in the front of your knee or thigh. Hold this position for 30 seconds. Slowly slide your left / right heel back to the starting position. Repeat 2 times. Complete this exercise 3 times per week. Exercise C: Quadriceps, Prone     Lie on your abdomen  on a firm surface, such as a bed or padded floor. Bend your left / right knee and hold your ankle. If you cannot reach your ankle or pant leg, loop a belt around your foot and grab the belt instead. Gently pull your heel toward your buttocks. Your knee should not slide out to the side. You should feel a stretch in the front of your thigh and knee. Hold this position for 30 seconds. Repeat 2 times. Complete this stretch 3 times per week. Exercise D: Hamstring, Supine  Lie on your back. Loop a belt or towel over the ball of your left / right foot. The ball of your foot is on the walking surface, right under your toes. Straighten your left / right knee and slowly pull on the belt to raise your leg until you feel a gentle stretch behind your knee. Do not let your left / right knee bend while you do this. Keep your other leg flat on the floor. Hold this position for 30 seconds. Repeat 2 times. Complete this stretch 3 times per week. STRENGTHENING EXERCISES  These exercises build strength and endurance in your knee. Endurance is the ability to use your muscles for a long time, even after they get tired. Exercise E: Quadriceps, Isometric     Lie on your back with your left / right  leg extended and your other knee bent. Put a rolled towel or small pillow under your knee if told by your health care provider. Slowly tense the muscles in the front of your left / right thigh. You should see your kneecap slide up toward your hip or see increased dimpling just above the knee. This motion will push the back of the knee toward the floor. For 3 seconds, keep the muscle as tight as you can without increasing your pain. Relax the muscles slowly and completely. Repeat for 10 total reps Repeat 2 ti mes. Complete this exercise 3 times per week. Exercise F: Straight Leg Raises - Quadriceps  Lie on your back with your left / right leg extended and your other knee bent. Tense the muscles in the front of your left /  right thigh. You should see your kneecap slide up or see increased dimpling just above the knee. Your thigh may even shake a bit. Keep these muscles tight as you raise your leg 4-6 inches (10-15 cm) off the floor. Do not let your knee bend. Hold this position for 3 seconds. Keep these muscles tense as you lower your leg. Relax your muscles slowly and completely after each repetition. 10 total reps. Repeat 2 times. Complete this exercise 3 times per week.  Exercise G: Hamstring Curls     If told by your health care provider, do this exercise while wearing ankle weights. Begin with 5 lb weights (optional). Then increase the weight by 1 lb (0.5 kg) increments. Do not wear ankle weights that are more than 20 lbs to start with. Lie on your abdomen with your legs straight. Bend your left / right knee as far as you can without feeling pain. Keep your hips flat against the floor. Hold this position for 3 seconds. Slowly lower your leg to the starting position. Repeat for 10 reps.  Repeat 2 times. Complete this exercise 3 times per week. Exercise H: Squats (Quadriceps)  Stand in front of a table, with your feet and knees pointing straight ahead. You may rest your hands on the table for balance but not for support. Slowly bend your knees and lower your hips like you are going to sit in a chair. Keep your weight over your heels, not over your toes. Keep your lower legs upright so they are parallel with the table legs. Do not let your hips go lower than your knees. Do not bend lower than told by your health care provider. If your knee pain increases, do not bend as low. Hold the squat position for 1 second. Slowly push with your legs to return to standing. Do not use your hands to pull yourself to standing. Repeat 2 times. Complete this exercise 3 times per week. Exercise I: Wall Slides (Quadriceps)     Lean your back against a smooth wall or door while you walk your feet out 18-24 inches (46-61  cm) from it. Place your feet hip-width apart. Slowly slide down the wall or door until your knees Repeat 2 times. Complete this exercise every other day. Exercise K: Straight Leg Raises - Hip Abductors  Lie on your side with your left / right leg in the top position. Lie so your head, shoulder, knee, and hip line up. You may bend your bottom knee to help you keep your balance. Roll your hips slightly forward so your hips are stacked directly over each other and your left / right knee is facing forward. Leading with your heel,  lift your top leg 4-6 inches (10-15 cm). You should feel the muscles in your outer hip lifting. Do not let your foot drift forward. Do not let your knee roll toward the ceiling. Hold this position for 3 seconds. Slowly return your leg to the starting position. Let your muscles relax completely after each repetition. 10 total reps. Repeat 2 times. Complete this exercise 3 times per week. Exercise J: Straight Leg Raises - Hip Extensors  Lie on your abdomen on a firm surface. You can put a pillow under your hips if that is more comfortable. Tense the muscles in your buttocks and lift your left / right leg about 4-6 inches (10-15 cm). Keep your knee straight as you lift your leg. Hold this position for 3 seconds. Slowly lower your leg to the starting position. Let your leg relax completely after each repetition. Repeat 2 times. Complete this exercise 3 times per week. Document Released: 01/17/2005 Document Revised: 11/28/2015 Document Reviewed: 01/09/2015 Elsevier Interactive Patient Education  2017 [employer].

## 2022-08-21 LAB — PTH, INTACT AND CALCIUM
Calcium: 8 mg/dL — ABNORMAL LOW (ref 8.6–10.3)
PTH: 403 pg/mL — ABNORMAL HIGH (ref 16–77)

## 2022-08-23 ENCOUNTER — Encounter: Payer: Self-pay | Admitting: Family Medicine

## 2022-08-23 LAB — VITAMIN B1: Vitamin B1 (Thiamine): 202 nmol/L — ABNORMAL HIGH (ref 8–30)

## 2022-08-24 ENCOUNTER — Other Ambulatory Visit: Payer: Self-pay | Admitting: Family Medicine

## 2022-08-24 ENCOUNTER — Telehealth: Payer: Self-pay | Admitting: Hematology and Oncology

## 2022-08-24 DIAGNOSIS — R748 Abnormal levels of other serum enzymes: Secondary | ICD-10-CM

## 2022-08-24 DIAGNOSIS — R790 Abnormal level of blood mineral: Secondary | ICD-10-CM

## 2022-08-24 DIAGNOSIS — D509 Iron deficiency anemia, unspecified: Secondary | ICD-10-CM

## 2022-08-24 LAB — ALKALINE PHOSPHATASE ISOENZYMES
Alkaline phosphatase (APISO): 347 U/L — ABNORMAL HIGH (ref 35–144)
Bone Isoenzymes: 79 % — ABNORMAL HIGH (ref 28–66)
Intestinal Isoenzymes: 0 % — ABNORMAL LOW (ref 1–24)
Liver Isoenzymes: 21 % — ABNORMAL LOW (ref 25–69)
Macrohepatic isoenzymes: 0 % (ref <=0)
Placental isoenzymes: 0 % (ref <=0)

## 2022-08-24 LAB — ZINC: Zinc: 57 ug/dL — ABNORMAL LOW (ref 60–130)

## 2022-08-24 NOTE — Telephone Encounter (Signed)
Scheduled appointment per scheduling message. Patient is aware of the made appointment. 

## 2022-09-04 ENCOUNTER — Encounter (HOSPITAL_COMMUNITY)
Admission: RE | Admit: 2022-09-04 | Discharge: 2022-09-04 | Disposition: A | Discharge status: Home / Self Care | Payer: Medicare Other | Source: Ambulatory Visit | Attending: Family Medicine | Admitting: Family Medicine

## 2022-09-04 DIAGNOSIS — R748 Abnormal levels of other serum enzymes: Secondary | ICD-10-CM | POA: Insufficient documentation

## 2022-09-04 DIAGNOSIS — M170 Bilateral primary osteoarthritis of knee: Secondary | ICD-10-CM | POA: Diagnosis not present

## 2022-09-04 MED ORDER — TECHNETIUM TC 99M MEDRONATE IV KIT
21.2000 | PACK | Freq: Once | INTRAVENOUS | Status: AC
  Administered 2022-09-04: 21.2 via INTRAVENOUS

## 2022-09-06 ENCOUNTER — Ambulatory Visit (INDEPENDENT_AMBULATORY_CARE_PROVIDER_SITE_OTHER): Payer: Medicare Other | Admitting: Family

## 2022-09-06 ENCOUNTER — Encounter: Payer: Self-pay | Admitting: Family

## 2022-09-06 ENCOUNTER — Telehealth: Payer: Self-pay | Admitting: Family

## 2022-09-06 ENCOUNTER — Other Ambulatory Visit: Payer: Self-pay | Admitting: Family

## 2022-09-06 ENCOUNTER — Ambulatory Visit: Payer: Medicare Other | Admitting: Family Medicine

## 2022-09-06 ENCOUNTER — Ambulatory Visit (HOSPITAL_BASED_OUTPATIENT_CLINIC_OR_DEPARTMENT_OTHER)
Admission: RE | Admit: 2022-09-06 | Discharge: 2022-09-06 | Disposition: A | Discharge status: Home / Self Care | Payer: Medicare Other | Source: Ambulatory Visit | Attending: Family | Admitting: Family

## 2022-09-06 VITALS — BP 118/70 | HR 62 | Ht 73.0 in | Wt 195.0 lb

## 2022-09-06 DIAGNOSIS — S6991XA Unspecified injury of right wrist, hand and finger(s), initial encounter: Secondary | ICD-10-CM

## 2022-09-06 DIAGNOSIS — M79641 Pain in right hand: Secondary | ICD-10-CM | POA: Diagnosis not present

## 2022-09-06 DIAGNOSIS — S61451A Open bite of right hand, initial encounter: Secondary | ICD-10-CM | POA: Diagnosis not present

## 2022-09-06 NOTE — Progress Notes (Signed)
Rubin Payor, PhD, LAT, ATC acting as a scribe for Daniel Graham, MD.  Daniel [name] is a 67 y.o. male who presents to [facility] at [facility] today for R hand pain ongoing since Wed. MOI: He fell off of a horse, tumbling head 1st. He rides [employer] equ[employer]. Pt locates pain to his R 3rd finger  R hand swelling: yes Treatments tried: IBU, Asprin  Additionally he notes chronic bilateral knee pain.  This interferes with his normal activity and produces some knee swelling.  He is leaving for vacation to [place] in 2 weeks.  Dx imaging: 09/06/22 R hand XR   Pertinent review of systems: No fevers or chills  Relevant historical information: History of duodenal switch with mineral and vitamin deficiency.   Exam:  BP 138/78   Pulse 61   Ht 6\' 1"  (1.854 m)   Wt 197 lb (89.4 kg)   SpO2 96%   BMI 25.99 kg/m  General: Well Developed, well nourished, and in no acute distress.   MSK: Right hand: Swelling and bruising at distal portion of third digit.  Tender palpation at DIP and PIP. Intact strength to extension and flexion at DIP.  Knees bilaterally mild effusion.  Normal motion with crepitation.    Lab and Radiology Results No results found for this or any previous visit (from the past 72 hour(s)). DG Hand Complete Right  Result Date: 09/06/2022 CLINICAL DATA:  Right hand injury after fall off horse yesterday. EXAM: RIGHT HAND - COMPLETE 3+ VIEW COMPARISON:  None Available. FINDINGS: There is no evidence of fracture or dislocation. There is no evidence of arthropathy or other focal bone abnormality. Soft tissues are unremarkable. IMPRESSION: Negative. Electronically Signed   By: Lupita Raider M.D.   On: 09/06/2022 10:50   I, Daniel [name], personally (independently) visualized and performed the interpretation of the images attached in this note.   X-ray images bilateral knees obtained today personally and independently interpreted  Left knee: Mild  medial and lateral DJD.  Chondrocalcinosis is present. No acute fractures are visible. Right knee: Mild to moderate lateral DJD.  Mild medial DJD.  Chondrocalcinosis is present.  No acute fractures are visible.  Await formal radiology review  Assessment and Plan: 67 y.o. male with right finger injury after falling off a horse.  No fractures are visible on x-ray per radiology and my independent review today.  Plan for stack splint and/or buddy taping.  Refer to OT to try to get him at least some motion back before he goes on vacation in 2 weeks.  Limited oxycodone for pain control.  He does have degenerative changes visible on bilateral knees.  They did have a bone scan performed this week that has not been read yet the does show some increased activity at the lateral portion of his knees which does match his arthritis visible on x-ray per my read today.  Could proceed with steroid injection anytime.  Looking through his labs he has low calcium and elevated parathyroid hormone and elevated alkaline phosphatase.  He may have increased bone turnover due to his malabsorptive process.  Await radiology overread bone scan.   PDMP reviewed during this encounter. Orders Placed This Encounter  Procedures   DG Knee AP/LAT W/Sunrise Right    Standing Status:   Future    Number of Occurrences:   1    Standing Expiration Date:   09/07/2023    Order Specific Question:   Reason for Exam (  SYMPTOM  OR DIAGNOSIS REQUIRED)    Answer:   eval knee pain    Order Specific Question:   Preferred imaging location?    Answer:   [facility]   DG Knee AP/LAT W/Sunrise Left    Standing Status:   Future    Number of Occurrences:   1    Standing Expiration Date:   09/07/2023    Order Specific Question:   Reason for Exam (SYMPTOM  OR DIAGNOSIS REQUIRED)    Answer:   eval knee pain    Order Specific Question:   Preferred imaging location?    Answer:   [facility]   Ambulatory referral to Occupational  Therapy    Referral Priority:   Routine    Referral Type:   Occupational Therapy    Referral Reason:   Specialty Services Required    Requested Specialty:   Occupational Therapy    Number of Visits Requested:   1   Meds ordered this encounter  Medications   oxyCODONE-acetaminophen (PERCOCET/ROXICET) 5-325 MG tablet    Sig: Take 1 tablet by mouth every 8 (eight) hours as needed for severe pain.    Dispense:  15 tablet    Refill:  0     Discussed warning signs or symptoms. Please see discharge instructions. Patient expresses understanding.   The above documentation has been reviewed and is accurate and complete Daniel [name], M.D.

## 2022-09-06 NOTE — Telephone Encounter (Signed)
He did not have a fracture but due to bruising and pain I do think he could benefit from splint or brace. I got him an appointment at [facility] today at 2:15 pm- arrive at 2:00 pm; he will be seeing Dr. Denyse Amass;  [address] [place], [place] [phone];

## 2022-09-06 NOTE — Progress Notes (Deleted)
   I, Jowell Bossi, PhD, LAT, ATC acting as a scribe for Evan Corey, MD.  Daniel [name] is a 67 y.o. male who presents to [facility] at [place] today for R hand pain x ***. MOI: He fell off of a horse, landing ***. Pt locates pain to ***  R hand swelling: Treatments tried:  Dx imaging: 09/06/22 R hand XR   Pertinent review of systems: ***  Relevant historical information: ***   Exam:  There were no vitals taken for this visit. General: Well Developed, well nourished, and in no acute distress.   MSK: ***    Lab and Radiology Results No results found for this or any previous visit (from the past 72 hour(s)). DG Hand Complete Right  Result Date: 09/06/2022 CLINICAL DATA:  Right hand injury after fall off horse yesterday. EXAM: RIGHT HAND - COMPLETE 3+ VIEW COMPARISON:  None Available. FINDINGS: There is no evidence of fracture or dislocation. There is no evidence of arthropathy or other focal bone abnormality. Soft tissues are unremarkable. IMPRESSION: Negative. Electronically Signed   By: James  Green Jr M.D.   On: 09/06/2022 10:50       Assessment and Plan: 67 y.o. male with ***   PDMP not reviewed this encounter. No orders of the defined types were placed in this encounter.  No orders of the defined types were placed in this encounter.    Discussed warning signs or symptoms. Please see discharge instructions. Patient expresses understanding.   ***  

## 2022-09-06 NOTE — Progress Notes (Signed)
Daniel [name] is a 67 y.o. male with the following history as recorded in Epic[employer]:  Patient Active Problem List   Diagnosis Date Noted   Bilateral lower extremity edema 11/11/2020   Gastroesophageal reflux disease 06/22/2020   Iron deficiency anemia 06/08/2020   Pulmonary emphysema (HCC) 01/20/2020   RLS (restless legs syndrome) 06/15/2019   Anxiety 10/17/2018   Changing skin lesion 12/24/2017   Dehydration 10/30/2016   Dysfunction of both eustachian tubes 10/30/2016   Tongue burning sensation 09/18/2016   Left lower quadrant pain 06/29/2016   Bloating 06/29/2016   Rectal bleeding 06/29/2016   Zinc deficiency 04/27/2016   H/O gastric bypass 04/27/2016   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   Diarrhea 12/19/2015   Hemorrhoids 05/22/2015   Osteoarthritis 01/13/2013   Unsteadiness 02/27/2012   BPH (benign prostatic hyperplasia) 08/22/2011   Vitamin D deficiency 05/21/2011   Type 2 diabetes mellitus with complication, without long-term current use of insulin (HCC)    Constipation 01/12/2011   Scrotal cyst 12/18/2010   Annual physical exam 09/17/2010   Shoulder pain, left 07/16/2010   Low back pain 07/16/2010   Insomnia 03/16/2010   OBSTRUCTIVE SLEEP APNEA 02/20/2010   Tobacco abuse 01/26/2010   PERIPHERAL EDEMA 01/26/2010   SNORING 01/26/2010   Hypotestosteronemia 03/24/2009   Hyperlipidemia 03/24/2009   ANXIETY DEPRESSION 03/24/2009   Essential hypertension 03/24/2009    Current Outpatient Medications  Medication Sig Dispense Refill   albuterol (VENTOLIN HFA) 108 (90 Base) MCG/ACT inhaler TAKE 2 PUFFS BY MOUTH 10-15 MINUTES PRIOR TO EXERCISE 18 g 2   amphetamine-dextroamphetamine (ADDERALL) 20 MG tablet Take 1 tablet (20 mg total) by mouth 2 (two) times daily. 60 tablet 0   buPROPion (WELLBUTRIN XL) 150 MG 24 hr tablet Take 1 tablet (150 mg total) by mouth 3 (three) times daily. 270 tablet 0   Cholecalciferol (VITAMIN D3) 1000 units CAPS  Take 1,000 Units by mouth daily.     clobetasol (TEMOVATE) 0.05 % external solution APPLY TO AFFECTED AREAS ON SCALP DAILY AS DIRECTED AS NEEDED FOR ITCHING 50 mL 1   diazepam (VALIUM) 5 MG tablet Take 1 tablet (5 mg total) by mouth every 12 (twelve) hours as needed for anxiety. 90 tablet 2   doxycycline (VIBRA-TABS) 100 MG tablet Take 1 tablet (100 mg total) by mouth 2 (two) times daily. 28 tablet 0   DULoxetine (CYMBALTA) 30 MG capsule TAKE 3 CAPSULES BY MOUTH EVERY DAY 270 capsule 2   finasteride (PROPECIA) 1 MG tablet TAKE 1 TABLET BY MOUTH DAILY 30 tablet 1   fluticasone (FLONASE) 50 MCG/ACT nasal spray Place 2 sprays into both nostrils daily. 16 g 6   folic acid (FOLVITE) 400 MCG tablet      lisinopril (ZESTRIL) 2.5 MG tablet TAKE 1 TABLET BY MOUTH DAILY 90 tablet 0   nebivolol (BYSTOLIC) 10 MG tablet TAKE 1 TABLET BY MOUTH TWICE A DAY 60 tablet 2   pantoprazole (PROTONIX) 40 MG tablet TAKE 1 TABLET BY MOUTH DAILY 90 tablet 1   pregabalin (LYRICA) 75 MG capsule TAKE ONE CAPSULE BY MOUTH EVERY NIGHT AT BEDTIME AS NEEDED 30 capsule 5   rOPINIRole (REQUIP) 1 MG tablet Take 2 tabs nightly. 180 tablet 3   Salicylic Acid 40 % PADS Apply 1 Application topically daily as needed. 48 each 1   sildenafil (VIAGRA) 100 MG tablet TAKE ONE TABLET BY MOUTH DAILY AS NEEDED FOR ERECTILE DYSFUNCTION 30 tablet 7   tamsulosin (FLOMAX) 0.4 MG CAPS capsule  Take 1 capsule (0.4 mg total) by mouth daily after supper. 90 capsule 0   Testosterone Enanthate (XYOSTED) 100 MG/0.5ML SOAJ 4 pre-filled syringes (Prefferd Testsoterone Enanthate, if not available then Cypionate is acceptable) for weekly [place] injection.     Thiamine HCl (VITAMIN B-1) 250 MG tablet Take 500 mg by mouth daily.     VITAMIN A PO Take 2,400 Units by mouth daily.     vitamin C (ASCORBIC ACID) 250 MG tablet Take 500 mg by mouth daily.     Vitamin D, Ergocalciferol, (DRISDOL) 1.25 MG (50000 UNIT) CAPS capsule TAKE 1 CAPSULE BY MOUTH TWICE WEEKLY. 24  capsule 1   vitamin E 200 UNIT capsule Take 200 Units by mouth daily.     vitamin k 100 MCG tablet      Zinc 50 MG CAPS Take 1 capsule by mouth daily.     zinc gluconate 50 MG tablet Take 100 mg by mouth daily.     zolpidem (AMBIEN) 10 MG tablet Take 1 tablet (10 mg total) by mouth at bedtime as needed for sleep. 90 tablet 1   furosemide (LASIX) 20 MG tablet One by mouth once a day as needed for swelling 30 tablet 1   No current facility-administered medications for this visit.    Allergies: Patient has no known allergies.  Past Medical History:  Diagnosis Date   Anal fistula    recurrent   Benign localized prostatic hyperplasia with lower urinary tract symptoms (LUTS)    Bilateral lower extremity edema    feet   COPD with emphysema (HCC)    Diverticulosis of colon    ED (erectile dysfunction)    Generalized anxiety disorder    Hemorrhoids    History of colon polyps    History of diabetes mellitus, type II    10-10-2017  per pt no issues after wt loss surgery    History of pneumothorax    1983 & 1988  right spontaneous, treated w/ chest tube   Hyperlipidemia    Hypertension    MDD (major depressive disorder)    OSA (obstructive sleep apnea)    10-10-2017  not used cpap since 2016 after wt loss surgery 2015   Status post biliopancreatic diversion with duodenal switch    07-30-2013   @ [facility] for wt. loss surgery   Vitamin D deficiency    Wears glasses    Zinc deficiency 04/27/2016    Past Surgical History:  Procedure Laterality Date   ANAL FISTULOTOMY N/A 08/07/2016   Procedure: ANAL FISTULOTOMY;  Surgeon: Abigail Miyamoto, MD;  Location: [facility] OR;  Service: General;  Laterality: N/A;   CHEST TUBE INSERTION  1988   right lung for spontaneous pneumothorax   COLONOSCOPY W/ BIOPSIES  08/02/2016   EXOSTECTECTOMY TOE Left 04/25/2017   Procedure: TARSAL EXOSTECTOMY OF MEDIAL CUNEIFORM AND EXOSTECTOMY OF THE FIRST METATARSAL;  Surgeon: Felecia Shelling, DPM;  Location: [facility] OR;  Service:  Podiatry;  Laterality: Left;   GASTROPLASTY DUODENAL SWITCH  07-30-2013    @[facility]    "wt loss surgery not gastric bypass surgery" (biliopancreatic diversion w/ duodenal switch)   HEMORRHOID SURGERY N/A 08/07/2016   Procedure: HEMORRHOIDECTOMY;  Surgeon: Abigail Miyamoto, MD;  Location: [facility] OR;  Service: General;  Laterality: N/A;   INCISION AND DRAINAGE ABSCESS N/A 10/14/2017   Procedure: POSSIBLE INCISION AND DRAINAGE OF ANAL ABSCESS;  Surgeon: Andria Meuse, MD;  Location:  SURGERY CENTER;  Service: General;  Laterality: N/A;   INGUINAL HERNIA REPAIR Left  04/10/2017   Procedure: LAPAROSCOPIC LEFT INGUINAL HERNIA REPAIR WITH MESH;  Surgeon: Abigail Miyamoto, MD;  Location: [facility];  Service: General;  Laterality: Left;   INSERTION OF MESH Left 04/10/2017   Procedure: INSERTION OF MESH;  Surgeon: Abigail Miyamoto, MD;  Location: [facility];  Service: General;  Laterality: Left;   IRRIGATION AND DEBRIDEMENT SEBACEOUS CYST     back   MASS EXCISION N/A 01/29/2018   Procedure: EXCISION of lesion on scalp;  Surgeon: Peggye Form, DO;  Location: [facility];  Service: Plastics;  Laterality: N/A;   RECTAL EXAM UNDER ANESTHESIA N/A 10/14/2017   Procedure: ANAL EXAM UNDER ANESTHESIA, INJECTION OF FIBRIN GLUE;  Surgeon: Andria Meuse, MD;  Location: [facility];  Service: General;  Laterality: N/A;    Family History  Problem Relation Age of Onset   Cancer Mother        ovarian   Hyperlipidemia Father    Hypertension Father    Heart disease Father        MI 7/19   Emphysema Father    Pulmonary disease Father    Heart disease Maternal Grandmother    Heart disease Maternal Grandfather    Stroke Maternal Grandfather    Heart disease Paternal Grandmother    Angina Paternal Grandmother    Heart disease Paternal Grandfather    Stroke Paternal Grandfather    Diabetes Maternal Uncle    Kidney disease Maternal  Uncle    Diabetes Paternal Uncle    Cancer Paternal Uncle    Neuropathy Neg Hx     Social History   Tobacco Use   Smoking status: Every Day    Packs/day: 0.50    Years: 50.00    Additional pack years: 0.00    Total pack years: 25.00    Types: Cigarettes   Smokeless tobacco: Never  Substance Use Topics   Alcohol use: Yes    Alcohol/week: 0.0 standard drinks of alcohol    Comment: rare    Subjective:   Fell yesterday while riding a horse; concerned about pain/ bruising/ swelling of 4th finger/ right hand;   Objective:  Vitals:   09/06/22 0937  BP: 118/70  Pulse: 62  SpO2: 98%  Weight: 195 lb (88.5 kg)  Height: 6\' 1"  (1.854 m)    General: Well developed, well nourished, in no acute distress  Skin : Warm and dry. Bruising noted along palmar surface; Head: Normocephalic and atraumatic  Eyes: Sclera and conjunctiva clear; pupils round and reactive to light; extraocular movements intact  Ears: External normal; canals clear; tympanic membranes normal  Oropharynx: Pink, supple. No suspicious lesions  Neck: Supple without thyromegaly, adenopathy  Lungs: Respirations unlabored;  Musculoskeletal: swelling noted of right hand Extremities: No edema, cyanosis, clubbing  Vessels: Symmetric bilaterally  Neurologic: Alert and oriented; speech intact; face symmetrical; moves all extremities well; CNII-XII intact without focal deficit   Assessment:  1. Right hand pain     Plan:  STAT X-ray does not show fracture; however due to appearance of hand, do feel he could benefit from splint or brace; STAT referral to sports medicine today; follow up with his PCP with further questions or concerns.   No follow-ups on file.  Orders Placed This Encounter  Procedures   DG Hand Complete Right    Standing Status:   Future    Number of Occurrences:   1    Standing Expiration Date:   03/08/2023  Order Specific Question:   Reason for Exam (SYMPTOM  OR DIAGNOSIS REQUIRED)    Answer:   right  hand injury    Order Specific Question:   Preferred imaging location?    Answer:   [facility]    Requested Prescriptions    No prescriptions requested or ordered in this encounter

## 2022-09-06 NOTE — Telephone Encounter (Signed)
Spoke with pt, pt is aware of results and expressed understanding. Pt states he will go to his appointment later this afternoon.

## 2022-09-07 ENCOUNTER — Ambulatory Visit (INDEPENDENT_AMBULATORY_CARE_PROVIDER_SITE_OTHER): Payer: Medicare Other

## 2022-09-07 ENCOUNTER — Ambulatory Visit: Payer: Medicare Other | Admitting: Family Medicine

## 2022-09-07 VITALS — BP 138/78 | HR 61 | Ht 73.0 in | Wt 197.0 lb

## 2022-09-07 DIAGNOSIS — M25562 Pain in left knee: Secondary | ICD-10-CM | POA: Diagnosis not present

## 2022-09-07 DIAGNOSIS — M79641 Pain in right hand: Secondary | ICD-10-CM

## 2022-09-07 DIAGNOSIS — M11262 Other chondrocalcinosis, left knee: Secondary | ICD-10-CM | POA: Diagnosis not present

## 2022-09-07 DIAGNOSIS — G8929 Other chronic pain: Secondary | ICD-10-CM | POA: Diagnosis not present

## 2022-09-07 DIAGNOSIS — M25561 Pain in right knee: Secondary | ICD-10-CM | POA: Diagnosis not present

## 2022-09-07 MED ORDER — OXYCODONE-ACETAMINOPHEN 5-325 MG PO TABS: 1.0000 | ORAL_TABLET | Freq: Three times a day (TID) | ORAL | 0 refills | Status: DC | PRN

## 2022-09-07 NOTE — Patient Instructions (Addendum)
Thank you for coming in today.   Please get an Xray today before you leave   I can do a cortisone inaction in those knees before you leave for that trip.   Typically about 1 week before the trip.   I've referred you to Occupational Therapy.  Let us know if you don't hear from them in one week.

## 2022-09-08 ENCOUNTER — Other Ambulatory Visit: Payer: Self-pay | Admitting: Family Medicine

## 2022-09-10 NOTE — Progress Notes (Signed)
Left knee x-ray shows possible pseudogout. (Mild chondrocalcinosis seen on x-ray).  There is a little bit of calcific tendinitis at the top of the kneecap where the tendon attaches.  No severe arthritis is visible.  I looked at your bone scan and there is arthritis in multiple locations which the knee should be one of them.

## 2022-09-10 NOTE — Progress Notes (Signed)
Right knee x-ray looks normal to radiology.  No severe arthritis is present.  We can do cortisone shots in both knees if you would like.

## 2022-09-15 NOTE — Progress Notes (Signed)
Patient Care Team: Sharlene Dory, DO as PCP - General (Family Medicine) Portenier, Annabelle Harman, MD as Referring Physician (Surgery) Nita Sells, MD as Consulting Physician (Dermatology)  DIAGNOSIS: No diagnosis found.  SUMMARY OF ONCOLOGIC HISTORY: Oncology History   No history exists.    CHIEF COMPLIANT: Follow-up IDA  INTERVAL HISTORY: Daniel [name] is a 67 y.o. male with above-mentioned history of IDA. He presents to the clinic for a follow-up.    ALLERGIES:  has No Known Allergies.  MEDICATIONS:  Current Outpatient Medications  Medication Sig Dispense Refill   albuterol (VENTOLIN HFA) 108 (90 Base) MCG/ACT inhaler TAKE 2 PUFFS BY MOUTH 10-15 MINUTES PRIOR TO EXERCISE 18 g 2   amphetamine-dextroamphetamine (ADDERALL) 20 MG tablet Take 1 tablet (20 mg total) by mouth 2 (two) times daily. 60 tablet 0   buPROPion (WELLBUTRIN XL) 150 MG 24 hr tablet Take 1 tablet (150 mg total) by mouth 3 (three) times daily. 270 tablet 0   Cholecalciferol (VITAMIN D3) 1000 units CAPS Take 1,000 Units by mouth daily.     clobetasol (TEMOVATE) 0.05 % external solution APPLY TO AFFECTED AREAS ON SCALP DAILY AS DIRECTED AS NEEDED FOR ITCHING 50 mL 1   diazepam (VALIUM) 5 MG tablet Take 1 tablet (5 mg total) by mouth every 12 (twelve) hours as needed for anxiety. 90 tablet 2   doxycycline (VIBRA-TABS) 100 MG tablet Take 1 tablet (100 mg total) by mouth 2 (two) times daily. 28 tablet 0   DULoxetine (CYMBALTA) 30 MG capsule TAKE 3 CAPSULES BY MOUTH EVERY DAY 270 capsule 2   finasteride (PROPECIA) 1 MG tablet TAKE 1 TABLET BY MOUTH DAILY 30 tablet 1   fluticasone (FLONASE) 50 MCG/ACT nasal spray Place 2 sprays into both nostrils daily. 16 g 6   folic acid (FOLVITE) 400 MCG tablet      furosemide (LASIX) 20 MG tablet One by mouth once a day as needed for swelling 30 tablet 1   lisinopril (ZESTRIL) 2.5 MG tablet TAKE 1 TABLET BY MOUTH DAILY 90 tablet 0   nebivolol (BYSTOLIC) 10 MG tablet TAKE 1  TABLET BY MOUTH TWICE A DAY 60 tablet 2   oxyCODONE-acetaminophen (PERCOCET/ROXICET) 5-325 MG tablet Take 1 tablet by mouth every 8 (eight) hours as needed for severe pain. 15 tablet 0   pantoprazole (PROTONIX) 40 MG tablet TAKE 1 TABLET BY MOUTH DAILY 90 tablet 1   rOPINIRole (REQUIP) 1 MG tablet Take 2 tabs nightly. 180 tablet 3   Salicylic Acid 40 % PADS Apply 1 Application topically daily as needed. 48 each 1   sildenafil (VIAGRA) 100 MG tablet TAKE ONE TABLET BY MOUTH DAILY AS NEEDED FOR ERECTILE DYSFUNCTION 30 tablet 7   tamsulosin (FLOMAX) 0.4 MG CAPS capsule Take 1 capsule (0.4 mg total) by mouth daily after supper. 90 capsule 0   Testosterone Enanthate (XYOSTED) 100 MG/0.5ML SOAJ 4 pre-filled syringes (Prefferd Testsoterone Enanthate, if not available then Cypionate is acceptable) for weekly [place] injection.     Thiamine HCl (VITAMIN B-1) 250 MG tablet Take 500 mg by mouth daily.     VITAMIN A PO Take 2,400 Units by mouth daily.     vitamin C (ASCORBIC ACID) 250 MG tablet Take 500 mg by mouth daily.     Vitamin D, Ergocalciferol, (DRISDOL) 1.25 MG (50000 UNIT) CAPS capsule TAKE 1 CAPSULE BY MOUTH TWICE WEEKLY. 24 capsule 1   vitamin E 200 UNIT capsule Take 200 Units by mouth daily.     vitamin k  100 MCG tablet      Zinc 50 MG CAPS Take 1 capsule by mouth daily.     zinc gluconate 50 MG tablet Take 100 mg by mouth daily.     zolpidem (AMBIEN) 10 MG tablet Take 1 tablet (10 mg total) by mouth at bedtime as needed for sleep. 90 tablet 1   No current facility-administered medications for this visit.    PHYSICAL EXAMINATION: ECOG PERFORMANCE STATUS: {CHL ONC ECOG PS:[id number]}  There were no vitals filed for this visit. There were no vitals filed for this visit.  BREAST:*** No palpable masses or nodules in either right or left breasts. No palpable axillary supraclavicular or infraclavicular adenopathy no breast tenderness or nipple discharge. (exam performed in the presence of a  chaperone)  LABORATORY DATA:  I have reviewed the data as listed    Latest Ref Rng & Units 08/20/2022   10:49 AM 07/31/2022   10:12 AM 06/26/2021    2:24 PM  CMP  Glucose 70 - 99 mg/dL  92  161   BUN 6 - 23 mg/dL  8  11   Creatinine 0.96 - 1.50 mg/dL  0.45  4.09   Sodium 811 - 145 mEq/L  141  139   Potassium 3.5 - 5.1 mEq/L  4.2  4.3   Chloride 96 - 112 mEq/L  108  108   CO2 19 - 32 mEq/L  27  25   Calcium 8.6 - 10.3 mg/dL 8.0  7.8  7.8   Total Protein 6.0 - 8.3 g/dL 6.0  6.0  6.1   Total Bilirubin 0.2 - 1.2 mg/dL 0.5  0.5  0.6   Alkaline Phos 39 - 117 U/L 337  324  255   AST 0 - 37 U/L 25  29  40   ALT 0 - 53 U/L 26  29  49     Lab Results  Component Value Date   WBC 5.3 07/31/2022   HGB 14.7 07/31/2022   HCT 44.2 07/31/2022   MCV 89.8 07/31/2022   PLT 199.0 07/31/2022   NEUTROABS 3.9 07/05/2021    ASSESSMENT & PLAN:  No problem-specific Assessment & Plan notes found for this encounter.    No orders of the defined types were placed in this encounter.  The patient has a good understanding of the overall plan. he agrees with it. he will call with any problems that may develop before the next visit here. Total time spent: 30 mins including face to face time and time spent for planning, charting and co-ordination of care   Sherlyn Lick, CMA 09/15/22    I Janan Ridge am acting as a Neurosurgeon for [employer]  ***

## 2022-09-18 ENCOUNTER — Inpatient Hospital Stay: Payer: Medicare Other | Attending: Hematology and Oncology | Admitting: Hematology and Oncology

## 2022-09-18 ENCOUNTER — Other Ambulatory Visit: Payer: Self-pay

## 2022-09-18 ENCOUNTER — Inpatient Hospital Stay: Payer: Medicare Other

## 2022-09-18 VITALS — BP 147/89 | HR 60 | Temp 97.9°F | Resp 18 | Ht 73.0 in | Wt 195.4 lb

## 2022-09-18 DIAGNOSIS — D509 Iron deficiency anemia, unspecified: Secondary | ICD-10-CM

## 2022-09-18 DIAGNOSIS — Z79899 Other long term (current) drug therapy: Secondary | ICD-10-CM | POA: Diagnosis not present

## 2022-09-18 LAB — CMP (CANCER CENTER ONLY)
ALT: 32 U/L (ref 0–44)
AST: 35 U/L (ref 15–41)
Albumin: 3.6 g/dL (ref 3.5–5.0)
Alkaline Phosphatase: 337 U/L — ABNORMAL HIGH (ref 38–126)
Anion gap: 3 — ABNORMAL LOW (ref 5–15)
BUN: 8 mg/dL (ref 8–23)
CO2: 28 mmol/L (ref 22–32)
Calcium: 8.6 mg/dL — ABNORMAL LOW (ref 8.9–10.3)
Chloride: 109 mmol/L (ref 98–111)
Creatinine: 0.88 mg/dL (ref 0.61–1.24)
GFR, Estimated: >60 mL/min (ref >60)
Glucose, Bld: 118 mg/dL — ABNORMAL HIGH (ref 70–99)
Potassium: 4.6 mmol/L (ref 3.5–5.1)
Sodium: 140 mmol/L (ref 135–145)
Total Bilirubin: 0.5 mg/dL (ref 0.3–1.2)
Total Protein: 5.8 g/dL — ABNORMAL LOW (ref 6.5–8.1)

## 2022-09-18 LAB — CBC WITH DIFFERENTIAL (CANCER CENTER ONLY)
Abs Immature Granulocytes: 0.01 10*3/uL (ref 0.00–0.07)
Basophils Absolute: 0 10*3/uL (ref 0.0–0.1)
Basophils Relative: 1 %
Eosinophils Absolute: 0.3 10*3/uL (ref 0.0–0.5)
Eosinophils Relative: 6 %
HCT: 44.3 % (ref 39.0–52.0)
Hemoglobin: 14.5 g/dL (ref 13.0–17.0)
Immature Granulocytes: 0 %
Lymphocytes Relative: 24 %
Lymphs Abs: 1.4 10*3/uL (ref 0.7–4.0)
MCH: 29.8 pg (ref 26.0–34.0)
MCHC: 32.7 g/dL (ref 30.0–36.0)
MCV: 91 fL (ref 80.0–100.0)
Monocytes Absolute: 0.6 10*3/uL (ref 0.1–1.0)
Monocytes Relative: 10 %
Neutro Abs: 3.5 10*3/uL (ref 1.7–7.7)
Neutrophils Relative %: 59 %
Platelet Count: 184 10*3/uL (ref 150–400)
RBC: 4.87 MIL/uL (ref 4.22–5.81)
RDW: 14.1 % (ref 11.5–15.5)
WBC Count: 5.9 10*3/uL (ref 4.0–10.5)
nRBC: 0 % (ref 0.0–0.2)

## 2022-09-18 LAB — IRON AND IRON BINDING CAPACITY (CC-WL,HP ONLY)
Iron: 67 ug/dL (ref 45–182)
Saturation Ratios: 13 % — ABNORMAL LOW (ref 17.9–39.5)
TIBC: 517 ug/dL — ABNORMAL HIGH (ref 250–450)
UIBC: 450 ug/dL — ABNORMAL HIGH (ref 117–376)

## 2022-09-18 NOTE — Assessment & Plan Note (Signed)
06/15/19: Ferritin 4.9, Iron sat: 7%, Folate 2.5, 03/25/20: Ferritin 7.5, Iron sat: 7.5%, B 12 492, Folate 5.9, Hb 12.8, MCV 90, RDW 15 09/28/2020: Hemoglobin 14.5, platelets 122, ANC 1.3, ferritin 53 (improved from 7.5), iron saturation 27% (improved from 7.5) 04/05/2021: Ferritin 9, iron saturation 16%, hemoglobin 15  07/05/2021: Hemoglobin 15.1, MCV 93.6, platelets 202, iron saturation 20%, TIBC 414, ferritin 16 08/20/2022: Ferritin 7.1, TIBC 516, iron saturation 10%   Status post biliopancreatic diversion with duodenal switch 2015 at [facility]   IV iron: March 2022   Based on the above lab work, I recommend that we infuse IV iron Recheck labs in 6 months and follow-up after that with a telephone visit

## 2022-09-19 ENCOUNTER — Ambulatory Visit: Payer: Medicare Other | Admitting: Family Medicine

## 2022-09-19 ENCOUNTER — Encounter: Payer: Self-pay | Admitting: Hematology and Oncology

## 2022-09-19 ENCOUNTER — Encounter: Payer: Self-pay | Admitting: Family Medicine

## 2022-09-19 VITALS — BP 130/84 | HR 65 | Temp 98.5°F | Ht 73.0 in | Wt 195.0 lb

## 2022-09-19 DIAGNOSIS — M170 Bilateral primary osteoarthritis of knee: Secondary | ICD-10-CM | POA: Diagnosis not present

## 2022-09-19 LAB — FERRITIN: Ferritin: 10 ng/mL — ABNORMAL LOW (ref 24–336)

## 2022-09-19 MED ORDER — METHYLPREDNISOLONE ACETATE 40 MG/ML IJ SUSP
40.0000 mg | Freq: Once | INTRAMUSCULAR | Status: AC
Start: 2022-09-19 — End: 2022-09-19
  Administered 2022-09-19: 40 mg via INTRA_ARTICULAR

## 2022-09-19 MED ORDER — NEBIVOLOL HCL 10 MG PO TABS: 10.0000 mg | ORAL_TABLET | Freq: Two times a day (BID) | ORAL | 2 refills | Status: DC

## 2022-09-19 NOTE — Progress Notes (Signed)
Musculoskeletal Exam  Patient: Daniel [name] DOB: 05-26-1955  DOS: 09/19/2022  SUBJECTIVE:  Chief Complaint:   Chief Complaint  Patient presents with   Hand Pain    Discuss results from Bone Scan  Middle right finger injury/fell off a horse    Daniel [name] is a 67 y.o.  male for evaluation and treatment of knee pain.   Onset: Chronic issue, imaging at sports medicine shows arthritis bilaterally. Location: Both knees Character:  aching  Progression of issue:  is unchanged Associated symptoms: Pain with standing up No bruising, redness, swelling, catching/locking Treatment: to date has been rest.   Neurovascular symptoms: no  Past Medical History:  Diagnosis Date   Anal fistula    recurrent   Benign localized prostatic hyperplasia with lower urinary tract symptoms (LUTS)    Bilateral lower extremity edema    feet   COPD with emphysema (HCC)    Diverticulosis of colon    ED (erectile dysfunction)    Generalized anxiety disorder    Hemorrhoids    History of colon polyps    History of diabetes mellitus, type II    10-10-2017  per pt no issues after wt loss surgery    History of pneumothorax    1983 & 1988  right spontaneous, treated w/ chest tube   Hyperlipidemia    Hypertension    MDD (major depressive disorder)    OSA (obstructive sleep apnea)    10-10-2017  not used cpap since 2016 after wt loss surgery 2015   Status post biliopancreatic diversion with duodenal switch    07-30-2013   @ [facility] for wt. loss surgery   Vitamin D deficiency    Wears glasses    Zinc deficiency 04/27/2016    Objective: VITAL SIGNS: BP 130/84 (BP Location: Left Arm, Patient Position: Sitting, Cuff Size: Normal)   Pulse 65   Temp 98.5 F (36.9 C) (Oral)   Ht 6\' 1"  (1.854 m)   Wt 195 lb (88.5 kg)   SpO2 98%   BMI 25.73 kg/m  Constitutional: Well formed, well developed. No acute distress. Thorax & Lungs: No accessory muscle use Musculoskeletal: Bilateral knees.   Normal active  range of motion: yes.   Normal passive range of motion: yes Tenderness to palpation: no Deformity: no Ecchymosis: no Neurologic: Normal sensory function.  Gait is normal. Psychiatric: Normal mood. Age appropriate judgment and insight. Alert & oriented x 3.    Procedure Note; Knee injection Verbal consent obtained. The area of the antero-lateral joint line was palpated and cleaned with alcohol x1. A 27-gauge needle was used to enter the joint space anterolaterally with ease. 40 mg of Depomedrol with 2 mL of 1% lidocaine was injected. A Band-Aid was placed. This process was repeated on the contralateral side. The patient tolerated the procedure well. There were no complications noted.   Assessment:  Primary osteoarthritis of both knees - Plan: PR ARTHROCENTESIS ASPIR&/INJ MAJOR JT/BURSA W/O [place]  Plan: Bilateral steroid injections today.  Stretches/exercises, heat, ice, Tylenol.  Consider physical therapy if no improvement. F/u as originally scheduled. The patient voiced understanding and agreement to the plan.   Jilda Roche [place], DO 09/19/22  10:32 AM

## 2022-09-19 NOTE — Patient Instructions (Addendum)
Ice/cold pack over area for 10-15 min twice daily.  OK to take Tylenol 1000 mg (2 extra strength tabs) or 975 mg (3 regular strength tabs) every 6 hours as needed.  Let us know if you need anything.  Knee Exercises It is normal to feel mild stretching, pulling, tightness, or discomfort as you do these exercises, but you should stop right away if you feel sudden pain or your pain gets worse.  STRETCHING AND RANGE OF MOTION EXERCISES  These exercises warm up your muscles and joints and improve the movement and flexibility of your knee. These exercises also help to relieve pain, numbness, and tingling. Exercise A: Knee Extension, Prone  Lie on your abdomen on a bed. Place your left / right knee just beyond the edge of the surface so your knee is not on the bed. You can put a towel under your left / right thigh just above your knee for comfort. Relax your leg muscles and allow gravity to straighten your knee. You should feel a stretch behind your left / right knee. Hold this position for 30 seconds. Scoot up so your knee is supported between repetitions. Repeat 2 times. Complete this stretch 3 times per week. Exercise B: Knee Flexion, Active     Lie on your back with both knees straight. If this causes back discomfort, bend your left / right knee so your foot is flat on the floor. Slowly slide your left / right heel back toward your buttocks until you feel a gentle stretch in the front of your knee or thigh. Hold this position for 30 seconds. Slowly slide your left / right heel back to the starting position. Repeat 2 times. Complete this exercise 3 times per week. Exercise C: Quadriceps, Prone     Lie on your abdomen on a firm surface, such as a bed or padded floor. Bend your left / right knee and hold your ankle. If you cannot reach your ankle or pant leg, loop a belt around your foot and grab the belt instead. Gently pull your heel toward your buttocks. Your knee should not slide out to  the side. You should feel a stretch in the front of your thigh and knee. Hold this position for 30 seconds. Repeat 2 times. Complete this stretch 3 times per week. Exercise D: Hamstring, Supine  Lie on your back. Loop a belt or towel over the ball of your left / right foot. The ball of your foot is on the walking surface, right under your toes. Straighten your left / right knee and slowly pull on the belt to raise your leg until you feel a gentle stretch behind your knee. Do not let your left / right knee bend while you do this. Keep your other leg flat on the floor. Hold this position for 30 seconds. Repeat 2 times. Complete this stretch 3 times per week. STRENGTHENING EXERCISES  These exercises build strength and endurance in your knee. Endurance is the ability to use your muscles for a long time, even after they get tired. Exercise E: Quadriceps, Isometric     Lie on your back with your left / right leg extended and your other knee bent. Put a rolled towel or small pillow under your knee if told by your health care provider. Slowly tense the muscles in the front of your left / right thigh. You should see your kneecap slide up toward your hip or see increased dimpling just above the knee. This motion will push   the back of the knee toward the floor. For 3 seconds, keep the muscle as tight as you can without increasing your pain. Relax the muscles slowly and completely. Repeat for 10 total reps Repeat 2 ti mes. Complete this exercise 3 times per week. Exercise F: Straight Leg Raises - Quadriceps  Lie on your back with your left / right leg extended and your other knee bent. Tense the muscles in the front of your left / right thigh. You should see your kneecap slide up or see increased dimpling just above the knee. Your thigh may even shake a bit. Keep these muscles tight as you raise your leg 4-6 inches (10-15 cm) off the floor. Do not let your knee bend. Hold this position for 3  seconds. Keep these muscles tense as you lower your leg. Relax your muscles slowly and completely after each repetition. 10 total reps. Repeat 2 times. Complete this exercise 3 times per week.  Exercise G: Hamstring Curls     If told by your health care provider, do this exercise while wearing ankle weights. Begin with 5 lb weights (optional). Then increase the weight by 1 lb (0.5 kg) increments. Do not wear ankle weights that are more than 20 lbs to start with. Lie on your abdomen with your legs straight. Bend your left / right knee as far as you can without feeling pain. Keep your hips flat against the floor. Hold this position for 3 seconds. Slowly lower your leg to the starting position. Repeat for 10 reps.  Repeat 2 times. Complete this exercise 3 times per week. Exercise H: Squats (Quadriceps)  Stand in front of a table, with your feet and knees pointing straight ahead. You may rest your hands on the table for balance but not for support. Slowly bend your knees and lower your hips like you are going to sit in a chair. Keep your weight over your heels, not over your toes. Keep your lower legs upright so they are parallel with the table legs. Do not let your hips go lower than your knees. Do not bend lower than told by your health care provider. If your knee pain increases, do not bend as low. Hold the squat position for 1 second. Slowly push with your legs to return to standing. Do not use your hands to pull yourself to standing. Repeat 2 times. Complete this exercise 3 times per week. Exercise I: Wall Slides (Quadriceps)     Lean your back against a smooth wall or door while you walk your feet out 18-24 inches (46-61 cm) from it. Place your feet hip-width apart. Slowly slide down the wall or door until your knees Repeat 2 times. Complete this exercise every other day. Exercise K: Straight Leg Raises - Hip Abductors  Lie on your side with your left / right leg in the top  position. Lie so your head, shoulder, knee, and hip line up. You may bend your bottom knee to help you keep your balance. Roll your hips slightly forward so your hips are stacked directly over each other and your left / right knee is facing forward. Leading with your heel, lift your top leg 4-6 inches (10-15 cm). You should feel the muscles in your outer hip lifting. Do not let your foot drift forward. Do not let your knee roll toward the ceiling. Hold this position for 3 seconds. Slowly return your leg to the starting position. Let your muscles relax completely after each repetition. 10 total reps.   Repeat 2 times. Complete this exercise 3 times per week. Exercise J: Straight Leg Raises - Hip Extensors  Lie on your abdomen on a firm surface. You can put a pillow under your hips if that is more comfortable. Tense the muscles in your buttocks and lift your left / right leg about 4-6 inches (10-15 cm). Keep your knee straight as you lift your leg. Hold this position for 3 seconds. Slowly lower your leg to the starting position. Let your leg relax completely after each repetition. Repeat 2 times. Complete this exercise 3 times per week. Document Released: 01/17/2005 Document Revised: 11/28/2015 Document Reviewed: 01/09/2015 Elsevier Interactive Patient Education  2017 [employer]vier Inc.  

## 2022-09-19 NOTE — Addendum Note (Signed)
Addended by: Scharlene Gloss B on: 09/19/2022 10:40 AM   Modules accepted: Orders

## 2022-10-08 ENCOUNTER — Encounter: Payer: Self-pay | Admitting: Family Medicine

## 2022-10-08 ENCOUNTER — Other Ambulatory Visit: Payer: Self-pay

## 2022-10-08 ENCOUNTER — Ambulatory Visit (INDEPENDENT_AMBULATORY_CARE_PROVIDER_SITE_OTHER): Payer: Medicare Other

## 2022-10-08 ENCOUNTER — Ambulatory Visit: Payer: Medicare Other | Admitting: Family Medicine

## 2022-10-08 VITALS — BP 102/48 | HR 77 | Ht 73.0 in | Wt 187.1 lb

## 2022-10-08 DIAGNOSIS — M79644 Pain in right finger(s): Secondary | ICD-10-CM | POA: Diagnosis not present

## 2022-10-08 DIAGNOSIS — G8929 Other chronic pain: Secondary | ICD-10-CM

## 2022-10-08 DIAGNOSIS — M25561 Pain in right knee: Secondary | ICD-10-CM

## 2022-10-08 DIAGNOSIS — M79641 Pain in right hand: Secondary | ICD-10-CM | POA: Diagnosis not present

## 2022-10-08 DIAGNOSIS — S62652A Nondisplaced fracture of medial phalanx of right middle finger, initial encounter for closed fracture: Secondary | ICD-10-CM

## 2022-10-08 DIAGNOSIS — M85841 Other specified disorders of bone density and structure, right hand: Secondary | ICD-10-CM | POA: Diagnosis not present

## 2022-10-08 DIAGNOSIS — M25562 Pain in left knee: Secondary | ICD-10-CM | POA: Diagnosis not present

## 2022-10-08 DIAGNOSIS — S62602D Fracture of unspecified phalanx of right middle finger, subsequent encounter for fracture with routine healing: Secondary | ICD-10-CM | POA: Diagnosis not present

## 2022-10-08 NOTE — Progress Notes (Unsigned)
I, Daniel [name], CMA acting as a scribe for Daniel Graham, MD.  Daniel [name] is a 67 y.o. male who presents to [facility] today for cont'd R 3rd finger pain. Injury occurred mid-June, from falling off of a horse. Pt was last seen by Dr. Denyse Amass on 09/07/22 and was advised to use a stax splint or buddy taping. He was prescribed oxycodone and referred to OT, but he did not schedule any visits.  Today, pt reports continued swelling in the finger along with painful flexion. Denies warmth or erythema. Peripatellar pain. Denies swelling and mechanical sx. Sx worsened after increased walking over the past couple of weeks.   Pt also c/o BILAT knee pain  Dx imaging: 09/06/22 R hand XR  Pertinent review of systems: No fevers or chills  Relevant historical information: Hypertension and diabetes   Exam:  BP (!) 102/48   Pulse 77   Ht 6\' 1"  (1.854 m)   Wt 187 lb 1.6 oz (84.9 kg)   SpO2 98%   BMI 24.68 kg/m  General: Well Developed, well nourished, and in no acute distress.   MSK: Right hand third digit is swollen.  He has an extensor lag at the DIP.  He does have intact strength to extension but does not have full range of motion. Tender palpation DIP and PIP third digit.  Knees bilaterally mild effusion normal motion with crepitation.  Intact strength.    Lab and Radiology Results  Procedure: Real-time Ultrasound Guided Injection of right knee joint superior lateral patella space Device: Philips Affiniti 50G/GE Logiq Images permanently stored and available for review in PACS Verbal informed consent obtained.  Discussed risks and benefits of procedure. Warned about infection, bleeding, hyperglycemia damage to structures among others. Patient expresses understanding and agreement Time-out conducted.   Noted no overlying erythema, induration, or other signs of local infection.   Skin prepped in a sterile fashion.   Local anesthesia: Topical Ethyl  chloride.   With sterile technique and under real time ultrasound guidance: 40 mg of Kenalog and 2 mL of Marcaine injected into knee joint. Fluid seen entering the joint capsule.   Completed without difficulty   Pain immediately resolved suggesting accurate placement of the medication.   Advised to call if fevers/chills, erythema, induration, drainage, or persistent bleeding.   Images permanently stored and available for review in the ultrasound unit.  Impression: Technically successful ultrasound guided injection.   Procedure: Real-time Ultrasound Guided Injection of left knee joint superior lateral patella space Device: Philips Affiniti 50G/GE Logiq Images permanently stored and available for review in PACS Verbal informed consent obtained.  Discussed risks and benefits of procedure. Warned about infection, bleeding, hyperglycemia damage to structures among others. Patient expresses understanding and agreement Time-out conducted.   Noted no overlying erythema, induration, or other signs of local infection.   Skin prepped in a sterile fashion.   Local anesthesia: Topical Ethyl chloride.   With sterile technique and under real time ultrasound guidance: 40 mg of Kenalog and 2 mL of Marcaine injected into knee joint. Fluid seen entering the joint capsule.   Completed without difficulty   Pain immediately resolved suggesting accurate placement of the medication.   Advised to call if fevers/chills, erythema, induration, drainage, or persistent bleeding.   Images permanently stored and available for review in the ultrasound unit.  Impression: Technically successful ultrasound guided injection.   X-ray images right third digit (right hand) obtained today personally and independently interpreted Oblique fracture  through the middle phalanx with minimal angulation.  Early callus formation is present. Await formal radiology review     Assessment and Plan: 67 y.o. male with right finger pain  after injury.  Fracture was not visible on original x-ray June 20.  On repeat x-ray today he does have a fracture of the middle phalanx of the third digit with minimal angulation.  Fortunately I can already see good callus formation so this should heal up nicely.  In clinic I did provide a longer stack splint which should provide better control over this fracture.  Recheck in 4 to 6 weeks.  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  Plan for steroid injection bilaterally today.   PDMP not reviewed this encounter. Orders Placed This Encounter  Procedures   [place] LIMITED JOINT SPACE STRUCTURES LOW BILAT(NO LINKED CHARGES)    Order Specific Question:   Reason for Exam (SYMPTOM  OR DIAGNOSIS REQUIRED)    Answer:   bilat knee pain    Order Specific Question:   Preferred imaging location?    Answer:   [facility] Finger Middle Right    Standing Status:   Future    Number of Occurrences:   1    Standing Expiration Date:   10/08/2023    Order Specific Question:   Reason for Exam (SYMPTOM  OR DIAGNOSIS REQUIRED)    Answer:   right 3rd finger pain    Order Specific Question:   Preferred imaging location?    Answer:   [facility]   No orders of the defined types were placed in this encounter.    Discussed warning signs or symptoms. Please see discharge instructions. Patient expresses understanding.   The above documentation has been reviewed and is accurate and complete Daniel [name], M.D.

## 2022-10-08 NOTE — Patient Instructions (Addendum)
Thank you for coming in today.   Please get an Xray today before you leave   You received an injection today. Seek immediate medical attention if the joint becomes red, extremely painful, or is oozing fluid.   Continue wearing the splint on your finger  Check back in 6 weeks

## 2022-10-09 ENCOUNTER — Inpatient Hospital Stay: Payer: Medicare Other

## 2022-10-09 ENCOUNTER — Other Ambulatory Visit: Payer: Self-pay

## 2022-10-09 ENCOUNTER — Encounter: Payer: Self-pay | Admitting: Family Medicine

## 2022-10-09 VITALS — BP 127/73 | HR 64 | Temp 98.3°F | Resp 18

## 2022-10-09 DIAGNOSIS — D509 Iron deficiency anemia, unspecified: Secondary | ICD-10-CM | POA: Diagnosis not present

## 2022-10-09 DIAGNOSIS — Z79899 Other long term (current) drug therapy: Secondary | ICD-10-CM | POA: Diagnosis not present

## 2022-10-09 MED ORDER — SODIUM CHLORIDE 0.9 % IV SOLN
300.0000 mg | Freq: Once | INTRAVENOUS | Status: AC
  Administered 2022-10-09: 300 mg via INTRAVENOUS
  Filled 2022-10-09: qty 300

## 2022-10-09 MED ORDER — SODIUM CHLORIDE 0.9 % IV SOLN: Freq: Once | INTRAVENOUS | Status: AC

## 2022-10-09 NOTE — Patient Instructions (Signed)
Iron Sucrose Injection What is this medication? IRON SUCROSE (EYE ern SOO krose) treats low levels of iron (iron deficiency anemia) in people with kidney disease. Iron is a mineral that plays an important role in making red blood cells, which carry oxygen from your lungs to the rest of your body. This medicine may be used for other purposes; ask your health care provider or pharmacist if you have questions. COMMON BRAND NAME(S): Venofer What should I tell my care team before I take this medication? They need to know if you have any of these conditions: Anemia not caused by low iron levels Heart disease High levels of iron in the blood Kidney disease Liver disease An unusual or allergic reaction to iron, other medications, foods, dyes, or preservatives Pregnant or trying to get pregnant Breastfeeding How should I use this medication? This medication is for infusion into a vein. It is given in a hospital or clinic setting. Talk to your care team about the use of this medication in children. While this medication may be prescribed for children as young as 2 years for selected conditions, precautions do apply. Overdosage: If you think you have taken too much of this medicine contact a poison control center or emergency room at once. NOTE: This medicine is only for you. Do not share this medicine with others. What if I miss a dose? Keep appointments for follow-up doses. It is important not to miss your dose. Call your care team if you are unable to keep an appointment. What may interact with this medication? Do not take this medication with any of the following: Deferoxamine Dimercaprol Other iron products This medication may also interact with the following: Chloramphenicol Deferasirox This list may not describe all possible interactions. Give your health care provider a list of all the medicines, herbs, non-prescription drugs, or dietary supplements you use. Also tell them if you smoke,  drink alcohol, or use illegal drugs. Some items may interact with your medicine. What should I watch for while using this medication? Visit your care team regularly. Tell your care team if your symptoms do not start to get better or if they get worse. You may need blood work done while you are taking this medication. You may need to follow a special diet. Talk to your care team. Foods that contain iron include: whole grains/cereals, dried fruits, beans, or peas, leafy green vegetables, and organ meats (liver, kidney). What side effects may I notice from receiving this medication? Side effects that you should report to your care team as soon as possible: Allergic reactions--skin rash, itching, hives, swelling of the face, lips, tongue, or throat Low blood pressure--dizziness, feeling faint or lightheaded, blurry vision Shortness of breath Side effects that usually do not require medical attention (report to your care team if they continue or are bothersome): Flushing Headache Joint pain Muscle pain Nausea Pain, redness, or irritation at injection site This list may not describe all possible side effects. Call your doctor for medical advice about side effects. You may report side effects to FDA at 1-800-FDA-1088. Where should I keep my medication? This medication is given in a hospital or clinic. It will not be stored at home. NOTE: This sheet is a summary. It may not cover all possible information. If you have questions about this medicine, talk to your doctor, pharmacist, or health care provider.  2024 Elsevier/Gold Standard (2022-08-10 00:00:00)

## 2022-10-16 ENCOUNTER — Inpatient Hospital Stay: Payer: Medicare Other

## 2022-10-16 ENCOUNTER — Other Ambulatory Visit: Payer: Self-pay

## 2022-10-16 VITALS — BP 128/78 | HR 62 | Temp 98.2°F | Resp 16

## 2022-10-16 DIAGNOSIS — D509 Iron deficiency anemia, unspecified: Secondary | ICD-10-CM

## 2022-10-16 DIAGNOSIS — Z79899 Other long term (current) drug therapy: Secondary | ICD-10-CM | POA: Diagnosis not present

## 2022-10-16 MED ORDER — SODIUM CHLORIDE 0.9 % IV SOLN
300.0000 mg | Freq: Once | INTRAVENOUS | Status: AC
  Administered 2022-10-16: 300 mg via INTRAVENOUS
  Filled 2022-10-16: qty 300

## 2022-10-16 MED ORDER — SODIUM CHLORIDE 0.9 % IV SOLN: Freq: Once | INTRAVENOUS | Status: AC

## 2022-10-16 NOTE — Progress Notes (Signed)
Right middle finger x-ray shows a healing fracture like we talked about.

## 2022-10-16 NOTE — Patient Instructions (Signed)
Iron Sucrose Injection What is this medication? IRON SUCROSE (EYE ern SOO krose) treats low levels of iron (iron deficiency anemia) in people with kidney disease. Iron is a mineral that plays an important role in making red blood cells, which carry oxygen from your lungs to the rest of your body. This medicine may be used for other purposes; ask your health care provider or pharmacist if you have questions. COMMON BRAND NAME(S): Venofer What should I tell my care team before I take this medication? They need to know if you have any of these conditions: Anemia not caused by low iron levels Heart disease High levels of iron in the blood Kidney disease Liver disease An unusual or allergic reaction to iron, other medications, foods, dyes, or preservatives Pregnant or trying to get pregnant Breastfeeding How should I use this medication? This medication is for infusion into a vein. It is given in a hospital or clinic setting. Talk to your care team about the use of this medication in children. While this medication may be prescribed for children as young as 2 years for selected conditions, precautions do apply. Overdosage: If you think you have taken too much of this medicine contact a poison control center or emergency room at once. NOTE: This medicine is only for you. Do not share this medicine with others. What if I miss a dose? Keep appointments for follow-up doses. It is important not to miss your dose. Call your care team if you are unable to keep an appointment. What may interact with this medication? Do not take this medication with any of the following: Deferoxamine Dimercaprol Other iron products This medication may also interact with the following: Chloramphenicol Deferasirox This list may not describe all possible interactions. Give your health care provider a list of all the medicines, herbs, non-prescription drugs, or dietary supplements you use. Also tell them if you smoke,  drink alcohol, or use illegal drugs. Some items may interact with your medicine. What should I watch for while using this medication? Visit your care team regularly. Tell your care team if your symptoms do not start to get better or if they get worse. You may need blood work done while you are taking this medication. You may need to follow a special diet. Talk to your care team. Foods that contain iron include: whole grains/cereals, dried fruits, beans, or peas, leafy green vegetables, and organ meats (liver, kidney). What side effects may I notice from receiving this medication? Side effects that you should report to your care team as soon as possible: Allergic reactions--skin rash, itching, hives, swelling of the face, lips, tongue, or throat Low blood pressure--dizziness, feeling faint or lightheaded, blurry vision Shortness of breath Side effects that usually do not require medical attention (report to your care team if they continue or are bothersome): Flushing Headache Joint pain Muscle pain Nausea Pain, redness, or irritation at injection site This list may not describe all possible side effects. Call your doctor for medical advice about side effects. You may report side effects to FDA at 1-800-FDA-1088. Where should I keep my medication? This medication is given in a hospital or clinic. It will not be stored at home. NOTE: This sheet is a summary. It may not cover all possible information. If you have questions about this medicine, talk to your doctor, pharmacist, or health care provider.  2024 Elsevier/Gold Standard (2022-08-10 00:00:00)

## 2022-10-16 NOTE — Progress Notes (Signed)
Pt observed for 15 minutes post Venofer infusion. Pt tolerated Tx well w/out incident. VSS at discharge.  Ambulatory to lobby.

## 2022-10-17 ENCOUNTER — Telehealth: Payer: Self-pay | Admitting: Hematology and Oncology

## 2022-10-17 NOTE — Telephone Encounter (Signed)
Pt called to advise that he has covid again. Pt was asked if he took a covid test, he said that the last time he had these symptoms Dr Carmelia Roller told him not to bother with the store bought covid tests since they give false negatives. Pt said he does not need an appt. Pt said he has loss of taste/smell , chest congestion, runny nose and cough. Pt said whatever dr Carmelia Roller gave him the last time worked great. He uses [facility] [address].

## 2022-10-17 NOTE — Telephone Encounter (Signed)
Patient called back and scheduled an appointment for 10/18/22.

## 2022-10-17 NOTE — Telephone Encounter (Signed)
Called left detailed message to schedule a Video Visit per PCP.

## 2022-10-18 ENCOUNTER — Encounter: Payer: Self-pay | Admitting: Family Medicine

## 2022-10-18 ENCOUNTER — Telehealth (INDEPENDENT_AMBULATORY_CARE_PROVIDER_SITE_OTHER): Payer: Medicare Other | Admitting: Family Medicine

## 2022-10-18 DIAGNOSIS — J069 Acute upper respiratory infection, unspecified: Secondary | ICD-10-CM | POA: Diagnosis not present

## 2022-10-18 MED ORDER — HYDROCOD POLI-CHLORPHE POLI ER 10-8 MG/5ML PO SUER
5.0000 mL | Freq: Two times a day (BID) | ORAL | 0 refills | Status: DC | PRN
Start: 2022-10-18 — End: 2024-01-22

## 2022-10-18 MED ORDER — PREDNISONE 20 MG PO TABS: 40.0000 mg | ORAL_TABLET | Freq: Every day | ORAL | 0 refills | Status: AC

## 2022-10-18 NOTE — Progress Notes (Signed)
Chief Complaint  Patient presents with   Covid Positive    Daniel [name] here for URI complaints. We are interacting via web portal for an electronic face-to-face visit. I verified patient's ID using 2 identifiers. Patient agreed to proceed with visit via this method. Patient is at home, I am at office. Patient and I are present for visit.   Duration: 1 week  Associated symptoms: rhinorrhea, chest tightness, and productive cough, loss of taste/smell Denies: sinus congestion, sinus pain, itchy watery eyes, ear pain, ear drainage, sore throat, wheezing, shortness of breath, myalgia, and fevers, N/V/D Treatment to date: antihistamines, robitussin- DM, Theraflu Sick contacts: No Tested + for covid.   Past Medical History:  Diagnosis Date   Anal fistula    recurrent   Benign localized prostatic hyperplasia with lower urinary tract symptoms (LUTS)    Bilateral lower extremity edema    feet   COPD with emphysema (HCC)    Diverticulosis of colon    ED (erectile dysfunction)    Generalized anxiety disorder    Hemorrhoids    History of colon polyps    History of diabetes mellitus, type II    10-10-2017  per pt no issues after wt loss surgery    History of pneumothorax    1983 & 1988  right spontaneous, treated w/ chest tube   Hyperlipidemia    Hypertension    MDD (major depressive disorder)    OSA (obstructive sleep apnea)    10-10-2017  not used cpap since 2016 after wt loss surgery 2015   Status post biliopancreatic diversion with duodenal switch    07-30-2013   @ [facility] for wt. loss surgery   Vitamin D deficiency    Wears glasses    Zinc deficiency 04/27/2016    Objective No conversational dyspnea Age appropriate judgment and insight Nml affect and mood  Viral URI with cough - Plan: chlorpheniramine-HYDROcodone (TUSSIONEX) 10-8 MG/5ML  If it were covid, he is outside the window for antiviral tx. 5 d pred burst 40 mg/d, cough syrup as above. Warnings about drowsiness  verbalized. Continue to push fluids, practice good hand hygiene, cover mouth when coughing. F/u prn. If starting to experience fevers, shaking, or shortness of breath, seek immediate care. Pt voiced understanding and agreement to the plan.  Jilda Roche Wheeler, DO 10/18/22 2:01 PM

## 2022-10-23 ENCOUNTER — Inpatient Hospital Stay: Payer: Medicare Other

## 2022-10-28 ENCOUNTER — Emergency Department (HOSPITAL_COMMUNITY): Payer: Medicare Other

## 2022-10-28 ENCOUNTER — Emergency Department (HOSPITAL_COMMUNITY)
Admission: EM | Admit: 2022-10-28 | Discharge: 2022-10-28 | Disposition: A | Discharge status: Home / Self Care | Payer: Medicare Other | Attending: Emergency Medicine | Admitting: Emergency Medicine

## 2022-10-28 ENCOUNTER — Other Ambulatory Visit: Payer: Self-pay

## 2022-10-28 ENCOUNTER — Encounter (HOSPITAL_COMMUNITY): Payer: Self-pay

## 2022-10-28 DIAGNOSIS — R0781 Pleurodynia: Secondary | ICD-10-CM | POA: Diagnosis not present

## 2022-10-28 DIAGNOSIS — R079 Chest pain, unspecified: Secondary | ICD-10-CM | POA: Insufficient documentation

## 2022-10-28 DIAGNOSIS — J9811 Atelectasis: Secondary | ICD-10-CM | POA: Diagnosis not present

## 2022-10-28 DIAGNOSIS — R0602 Shortness of breath: Secondary | ICD-10-CM | POA: Diagnosis not present

## 2022-10-28 DIAGNOSIS — R0789 Other chest pain: Secondary | ICD-10-CM | POA: Diagnosis not present

## 2022-10-28 DIAGNOSIS — Z8709 Personal history of other diseases of the respiratory system: Secondary | ICD-10-CM | POA: Insufficient documentation

## 2022-10-28 DIAGNOSIS — J939 Pneumothorax, unspecified: Secondary | ICD-10-CM | POA: Diagnosis not present

## 2022-10-28 LAB — CBC
HCT: 45.4 % (ref 39.0–52.0)
Hemoglobin: 14.5 g/dL (ref 13.0–17.0)
MCH: 30 pg (ref 26.0–34.0)
MCHC: 31.9 g/dL (ref 30.0–36.0)
MCV: 94 fL (ref 80.0–100.0)
Platelets: 186 10*3/uL (ref 150–400)
RBC: 4.83 MIL/uL (ref 4.22–5.81)
RDW: 15.9 % — ABNORMAL HIGH (ref 11.5–15.5)
WBC: 5.3 10*3/uL (ref 4.0–10.5)
nRBC: 0 % (ref 0.0–0.2)

## 2022-10-28 LAB — BASIC METABOLIC PANEL
Anion gap: 7 (ref 5–15)
BUN: 10 mg/dL (ref 8–23)
CO2: 23 mmol/L (ref 22–32)
Calcium: 8 mg/dL — ABNORMAL LOW (ref 8.9–10.3)
Chloride: 111 mmol/L (ref 98–111)
Creatinine, Ser: 0.83 mg/dL (ref 0.61–1.24)
GFR, Estimated: >60 mL/min (ref >60)
Glucose, Bld: 94 mg/dL (ref 70–99)
Potassium: 3.4 mmol/L — ABNORMAL LOW (ref 3.5–5.1)
Sodium: 141 mmol/L (ref 135–145)

## 2022-10-28 LAB — TROPONIN I (HIGH SENSITIVITY)
Troponin I (High Sensitivity): 2 ng/L (ref <18)
Troponin I (High Sensitivity): 2 ng/L (ref <18)

## 2022-10-28 MED ORDER — KETOROLAC TROMETHAMINE 15 MG/ML IJ SOLN
15.0000 mg | Freq: Once | INTRAMUSCULAR | Status: AC
  Administered 2022-10-28: 15 mg via INTRAVENOUS
  Filled 2022-10-28: qty 1

## 2022-10-28 MED ORDER — FENTANYL CITRATE PF 50 MCG/ML IJ SOSY
25.0000 ug | PREFILLED_SYRINGE | Freq: Once | INTRAMUSCULAR | Status: AC
  Administered 2022-10-28: 25 ug via INTRAVENOUS
  Filled 2022-10-28: qty 1

## 2022-10-28 MED ORDER — ASPIRIN 81 MG PO CHEW
324.0000 mg | CHEWABLE_TABLET | Freq: Once | ORAL | Status: AC
  Administered 2022-10-28: 324 mg via ORAL
  Filled 2022-10-28: qty 4

## 2022-10-28 MED ORDER — CYCLOBENZAPRINE HCL 10 MG PO TABS: 10.0000 mg | ORAL_TABLET | Freq: Two times a day (BID) | ORAL | 0 refills | Status: DC | PRN

## 2022-10-28 MED ORDER — OXYCODONE-ACETAMINOPHEN 5-325 MG PO TABS: 1.0000 | ORAL_TABLET | Freq: Four times a day (QID) | ORAL | 0 refills | Status: DC | PRN

## 2022-10-28 MED ORDER — IOHEXOL 350 MG/ML SOLN
75.0000 mL | Freq: Once | INTRAVENOUS | Status: AC | PRN
  Administered 2022-10-28: 75 mL via INTRAVENOUS

## 2022-10-28 MED ORDER — CYCLOBENZAPRINE HCL 10 MG PO TABS
5.0000 mg | ORAL_TABLET | Freq: Once | ORAL | Status: AC
  Administered 2022-10-28: 5 mg via ORAL
  Filled 2022-10-28: qty 1

## 2022-10-28 NOTE — Discharge Instructions (Addendum)
Please check with your doctor this week Return the emergency department if you are having new or worsening symptoms especially shortness of breath fever, or worsening pain

## 2022-10-28 NOTE — ED Triage Notes (Addendum)
Pt arrived via POV. C/o SOB, and pain in R ribs that is consistent w/previous spontaneous pneumothorax.  AOx4

## 2022-10-28 NOTE — ED Provider Notes (Signed)
[place] EMERGENCY DEPARTMENT AT [facility] Provider Note   CSN: [id number] Arrival date & time: 10/28/22  0759     History  Chief Complaint  Patient presents with   possible collasped lung    Daniel [name] is a 67 y.o. male.  HPI 67 year old male presents today stating that he has right-sided chest pain that he feels is consistent with previous pneumothorax.  He states he had pneumothorax x 3 in the past with the last one being in the early 90s.  Several days ago he coughed very hard and then he had some pain in the right side of his chest.  He has had some increased pain and feels that he has a pneumothorax.  He was asked if he was having dyspnea, but he stated "a chest x-Merla Sawka would show you real fast"    Home Medications Prior to Admission medications   Medication Sig Start Date End Date Taking? Authorizing Provider  cyclobenzaprine (FLEXERIL) 10 MG tablet Take 1 tablet (10 mg total) by mouth 2 (two) times daily as needed for muscle spasms. 10/28/22  Yes Margarita Grizzle, MD  oxyCODONE-acetaminophen (PERCOCET/ROXICET) 5-325 MG tablet Take 1 tablet by mouth every 6 (six) hours as needed for severe pain. 10/28/22  Yes Margarita Grizzle, MD  albuterol (VENTOLIN HFA) 108 (90 Base) MCG/ACT inhaler TAKE 2 PUFFS BY MOUTH 10-15 MINUTES PRIOR TO EXERCISE 03/27/21   Wendling, Jilda Roche, DO  amphetamine-dextroamphetamine (ADDERALL) 20 MG tablet Take 1 tablet (20 mg total) by mouth 2 (two) times daily. 10/05/19   Sharlene Dory, DO  buPROPion (WELLBUTRIN XL) 150 MG 24 hr tablet Take 1 tablet (150 mg total) by mouth 3 (three) times daily. 06/06/22   Sharlene Dory, DO  chlorpheniramine-HYDROcodone (TUSSIONEX) 10-8 MG/5ML Take 5 mLs by mouth every 12 (twelve) hours as needed for cough. 10/18/22   Sharlene Dory, DO  Cholecalciferol (VITAMIN D3) 1000 units CAPS Take 1,000 Units by mouth daily.    [provider]  clobetasol (TEMOVATE) 0.05 % external solution  APPLY TO AFFECTED AREAS ON SCALP DAILY AS DIRECTED AS NEEDED FOR ITCHING 03/28/22   Wendling, Jilda Roche, DO  diazepam (VALIUM) 5 MG tablet Take 1 tablet (5 mg total) by mouth every 12 (twelve) hours as needed for anxiety. 08/20/22   Sharlene Dory, DO  doxycycline (VIBRA-TABS) 100 MG tablet Take 1 tablet (100 mg total) by mouth 2 (two) times daily. 07/31/22   Sharlene Dory, DO  DULoxetine (CYMBALTA) 30 MG capsule TAKE 3 CAPSULES BY MOUTH EVERY DAY 02/11/19   Sharlene Dory, DO  finasteride (PROPECIA) 1 MG tablet TAKE 1 TABLET BY MOUTH DAILY 08/15/22   Sharlene Dory, DO  fluticasone [facility]) 50 MCG/ACT nasal spray Place 2 sprays into both nostrils daily. 02/26/22   Sharlene Dory, DO  folic acid (FOLVITE) 400 MCG tablet  09/17/19   [provider]  furosemide (LASIX) 20 MG tablet One by mouth once a day as needed for swelling 11/11/20 08/20/22  Sharlene Dory, DO  lisinopril (ZESTRIL) 2.5 MG tablet TAKE 1 TABLET BY MOUTH DAILY 09/10/22   Wendling, Jilda Roche, DO  nebivolol (BYSTOLIC) 10 MG tablet Take 1 tablet (10 mg total) by mouth 2 (two) times daily. 09/19/22   Sharlene Dory, DO  pantoprazole (PROTONIX) 40 MG tablet TAKE 1 TABLET BY MOUTH DAILY 06/11/22   Sharlene Dory, DO  rOPINIRole (REQUIP) 1 MG tablet Take 2 tabs nightly. 06/19/22   Arva Chafe  Paul, DO  Salicylic Acid 40 % PADS Apply 1 Application topically daily as needed. 07/17/22   Felecia Shelling, DPM  sildenafil (VIAGRA) 100 MG tablet TAKE ONE TABLET BY MOUTH DAILY AS NEEDED FOR ERECTILE DYSFUNCTION 03/28/22   Sharlene Dory, DO  tamsulosin (FLOMAX) 0.4 MG CAPS capsule Take 1 capsule (0.4 mg total) by mouth daily after supper. 02/23/22   Sharlene Dory, DO  Testosterone Enanthate (XYOSTED) 100 MG/0.5ML SOAJ 4 pre-filled syringes (Prefferd Testsoterone Enanthate, if not available then Cypionate is acceptable) for weekly Linganore injection. 01/22/22    [provider]  Thiamine HCl (VITAMIN B-1) 250 MG tablet Take 500 mg by mouth daily.    [provider]  VITAMIN A PO Take 2,400 Units by mouth daily.    [provider]  vitamin C (ASCORBIC ACID) 250 MG tablet Take 500 mg by mouth daily.    [provider]  Vitamin D, Ergocalciferol, (DRISDOL) 1.25 MG (50000 UNIT) CAPS capsule TAKE 1 CAPSULE BY MOUTH TWICE WEEKLY. 08/03/22   Sharlene Dory, DO  vitamin E 200 UNIT capsule Take 200 Units by mouth daily.    [provider]  vitamin k 100 MCG tablet  09/17/19   [provider]  Zinc 50 MG CAPS Take 1 capsule by mouth daily.    [provider]  zinc gluconate 50 MG tablet Take 100 mg by mouth daily.    [provider]  zolpidem (AMBIEN) 10 MG tablet Take 1 tablet (10 mg total) by mouth at bedtime as needed for sleep. 08/20/22   Sharlene Dory, DO      Allergies    Patient has no known allergies.    Review of Systems   Review of Systems  Physical Exam Updated Vital Signs BP (!) 142/93   Pulse (!) 59   Temp 98.7 F (37.1 C) (Oral)   Resp 11   Ht 1.854 m (6\' 1" )   Wt 86.2 kg   SpO2 95%   BMI 25.07 kg/m  Physical Exam Vitals reviewed.  Constitutional:      Appearance: Normal appearance.  HENT:     Head: Normocephalic.     Right Ear: External ear normal.     Left Ear: External ear normal.     Mouth/Throat:     Pharynx: Oropharynx is clear.  Eyes:     Pupils: Pupils are equal, round, and reactive to light.  Cardiovascular:     Rate and Rhythm: Normal rate and regular rhythm.     Pulses: Normal pulses.     Comments: Ttp anterior right chest Pulmonary:     Effort: Pulmonary effort is normal.     Breath sounds: Normal breath sounds.  Abdominal:     General: Bowel sounds are normal.     Palpations: Abdomen is soft.  Musculoskeletal:        General: No swelling. Normal range of motion.     Cervical back: Normal range of motion.     Right  lower leg: No edema.     Left lower leg: No edema.  Skin:    General: Skin is warm and dry.     Capillary Refill: Capillary refill takes less than 2 seconds.  Neurological:     General: No focal deficit present.     Mental Status: He is alert.     ED Results / Procedures / Treatments   Labs (all labs ordered are listed, but only abnormal results are displayed) Labs Reviewed  CBC - Abnormal; Notable for the following components:      Result Value   RDW 15.9 (*)    All other components within normal limits  BASIC METABOLIC PANEL - Abnormal; Notable for the following components:   Potassium 3.4 (*)    Calcium 8.0 (*)    All other components within normal limits  TROPONIN I (HIGH SENSITIVITY)  TROPONIN I (HIGH SENSITIVITY)    EKG EKG Interpretation Date/Time:  Sunday October 28 2022 11:48:29 EDT Ventricular Rate:  62 PR Interval:  143 QRS Duration:  94 QT Interval:  421 QTC Calculation: 428 R Axis:   69  Text Interpretation: Sinus rhythm No significant change since last tracing Confirmed by Margarita Grizzle [phone]) on 10/28/2022 12:06:15 PM  Radiology DG Chest 2 View  Result Date: 10/28/2022 CLINICAL DATA:  History falx. Right-sided chest pain. Patient is concerned he has a pneumothorax. EXAM: CHEST - 2 VIEW COMPARISON:  Chest and rib radiographs 02/02/2022 FINDINGS: The heart size and mediastinal contours are within normal limits. Both lungs are clear. The visualized skeletal structures are unremarkable. IMPRESSION: Negative two view chest x-Maisey Deandrade. Electronically Signed   By: Marin Roberts M.D.   On: 10/28/2022 09:06    Procedures Procedures    Medications Ordered in ED Medications  iohexol (OMNIPAQUE) 350 MG/ML injection 75 mL (75 mLs Intravenous Contrast Given 10/28/22 1114)  aspirin chewable tablet 324 mg (324 mg Oral Given 10/28/22 1155)  fentaNYL (SUBLIMAZE) injection 25 mcg (25 mcg Intravenous Given 10/28/22 1316)  ketorolac (TORADOL) 15 MG/ML injection 15 mg (15  mg Intravenous Given 10/28/22 1348)  cyclobenzaprine (FLEXERIL) tablet 5 mg (5 mg Oral Given 10/28/22 1349)    ED Course/ Medical Decision Making/ A&P Clinical Course as of 10/28/22 1433  Sun Oct 28, 2022  6045 Chest x-Joua Bake reviewed and interpreted and no definite evidence of pneumothorax noted on my interpretation Awaiting radiology interpretation-radiologist interpretation concurs [DR]  0848 CBC reviewed interpreted and within normal limits [DR]  1312 CT chest reviewed and interpreted and no evidence abnormality noted on my interpretation radiologist interpretation notes no evidence of acute pulmonary embolism and no acute pulmonary findings are mild medial atelectasis [DR]    Clinical Course User Index [DR] Margarita Grizzle, MD                                 Medical Decision Making Amount and/or Complexity of Data Reviewed Labs: ordered. Radiology: ordered.  Risk OTC drugs. Prescription drug management.   Patient with right anteior chest pain: DDX Patient seen and evaluated for chest pain.  Differential diagnosis of serious/life threatening causes of chest pain includes ACS, other diseases of the heart such as myocarditis or pericarditis, lung etiologies such as infection or pneumothorax, diseases of the great vessels such as aortic dissection or AAA, pulmonary embolism, or GI sources such as cholecystitis or other upper abdominal causes. Doubt ACS- heart score documented, EKG reviewed, Given the timing of pain to ER presentation, single delta troponin 2 was 2 so doubt NSTEMI troponin and repeat troponin obtained and WNL Doubt myocarditis/pericarditis/tamponade based on history, review of ekg and labs Doubt aortic dissection based on history and review of imaging Doubt intrinsic lung causes such as pneumonia or pneumothorax, based on history, physical exam, and studies obtained. Doubt PE based on history, physical exam, and PERC Doubt acute GI etiology requiring intervention based on  history, physical exam and labs. There is palpable pain to  right anterior chest and symptoms are consistent with chest wall pain Patient appears stable for discharge. Return precautions and need for follow up discussed and patient voices understanding         Final Clinical Impression(s) / ED Diagnoses Final diagnoses:  Chest pain, unspecified type    Rx / DC Orders ED Discharge Orders          Ordered    cyclobenzaprine (FLEXERIL) 10 MG tablet  2 times daily PRN        10/28/22 1433    oxyCODONE-acetaminophen (PERCOCET/ROXICET) 5-325 MG tablet  Every 6 hours PRN        10/28/22 1433              Margarita Grizzle, MD 10/28/22 1433

## 2022-10-29 ENCOUNTER — Inpatient Hospital Stay: Payer: Medicare Other | Attending: Hematology and Oncology

## 2022-10-29 VITALS — BP 130/78 | HR 60 | Temp 97.7°F | Resp 16 | Ht 73.0 in | Wt 193.8 lb

## 2022-10-29 DIAGNOSIS — D509 Iron deficiency anemia, unspecified: Secondary | ICD-10-CM | POA: Diagnosis not present

## 2022-10-29 MED ORDER — SODIUM CHLORIDE 0.9 % IV SOLN
300.0000 mg | Freq: Once | INTRAVENOUS | Status: AC
  Administered 2022-10-29: 300 mg via INTRAVENOUS
  Filled 2022-10-29: qty 300

## 2022-10-29 MED ORDER — SODIUM CHLORIDE 0.9 % IV SOLN: Freq: Once | INTRAVENOUS | Status: AC

## 2022-10-29 NOTE — Patient Instructions (Signed)
 Iron Sucrose Injection What is this medication? IRON SUCROSE (EYE ern SOO krose) treats low levels of iron (iron deficiency anemia) in people with kidney disease. Iron is a mineral that plays an important role in making red blood cells, which carry oxygen from your lungs to the rest of your body. This medicine may be used for other purposes; ask your health care provider or pharmacist if you have questions. COMMON BRAND NAME(S): Venofer What should I tell my care team before I take this medication? They need to know if you have any of these conditions: Anemia not caused by low iron levels Heart disease High levels of iron in the blood Kidney disease Liver disease An unusual or allergic reaction to iron, other medications, foods, dyes, or preservatives Pregnant or trying to get pregnant Breastfeeding How should I use this medication? This medication is for infusion into a vein. It is given in a hospital or clinic setting. Talk to your care team about the use of this medication in children. While this medication may be prescribed for children as young as 2 years for selected conditions, precautions do apply. Overdosage: If you think you have taken too much of this medicine contact a poison control center or emergency room at once. NOTE: This medicine is only for you. Do not share this medicine with others. What if I miss a dose? Keep appointments for follow-up doses. It is important not to miss your dose. Call your care team if you are unable to keep an appointment. What may interact with this medication? Do not take this medication with any of the following: Deferoxamine Dimercaprol Other iron products This medication may also interact with the following: Chloramphenicol Deferasirox This list may not describe all possible interactions. Give your health care provider a list of all the medicines, herbs, non-prescription drugs, or dietary supplements you use. Also tell them if you smoke,  drink alcohol, or use illegal drugs. Some items may interact with your medicine. What should I watch for while using this medication? Visit your care team regularly. Tell your care team if your symptoms do not start to get better or if they get worse. You may need blood work done while you are taking this medication. You may need to follow a special diet. Talk to your care team. Foods that contain iron include: whole grains/cereals, dried fruits, beans, or peas, leafy green vegetables, and organ meats (liver, kidney). What side effects may I notice from receiving this medication? Side effects that you should report to your care team as soon as possible: Allergic reactions--skin rash, itching, hives, swelling of the face, lips, tongue, or throat Low blood pressure--dizziness, feeling faint or lightheaded, blurry vision Shortness of breath Side effects that usually do not require medical attention (report to your care team if they continue or are bothersome): Flushing Headache Joint pain Muscle pain Nausea Pain, redness, or irritation at injection site This list may not describe all possible side effects. Call your doctor for medical advice about side effects. You may report side effects to FDA at 1-800-FDA-1088. Where should I keep my medication? This medication is given in a hospital or clinic. It will not be stored at home. NOTE: This sheet is a summary. It may not cover all possible information. If you have questions about this medicine, talk to your doctor, pharmacist, or health care provider.  2024 Elsevier/Gold Standard (2022-08-10 00:00:00)

## 2022-10-29 NOTE — Progress Notes (Signed)
Pt. declines to stay for 30 minute post observation. States he has tolerated treatment without issues. Vital signs stable, left via ambulation and no shortness of breath noted.

## 2022-11-02 ENCOUNTER — Telehealth: Payer: Self-pay

## 2022-11-02 NOTE — Telephone Encounter (Signed)
Transition Care Management Follow-up Telephone Call Date of discharge and from where: 10/28/2022 [facility] How have you been since you were released from the hospital? Patient stated he is feeling better and his pain level has decreased. Any questions or concerns? No  Items Reviewed: Did the pt receive and understand the discharge instructions provided? Yes  Medications obtained and verified? Yes  Other? No  Any new allergies since your discharge? No  Dietary orders reviewed? Yes Do you have support at home? Yes   Follow up appointments reviewed:  PCP Hospital f/u appt confirmed? No  Scheduled to see  on  @ . Specialist Hospital f/u appt confirmed? Yes  Scheduled to see Rodolph Bong, MD on 11/20/2022 @ [facility] at [facility]. Are transportation arrangements needed? No  If their condition worsens, is the pt aware to call PCP or go to the Emergency Dept.? Yes Was the patient provided with contact information for the PCP's office or ED? Yes Was to pt encouraged to call back with questions or concerns? Yes  Mckenze Slone [facility]  [facility] Population Health Community Resource Care Guide   [email or web address]  ?? [phone]   Website: [email or web address]  [email or web address]

## 2022-11-02 NOTE — Telephone Encounter (Signed)
Transition Care Management Unsuccessful Follow-up Telephone Call  Date of discharge and from where:  10/28/2022 [facility]  Attempts:  1st Attempt  Reason for unsuccessful TCM follow-up call:  Left voice message  Jamol Ginyard [facility]  [facility] Population Health Community Resource Care Guide   [email or web address]  ?? [phone]   Website: [email or web address]  [email or web address]

## 2022-11-06 ENCOUNTER — Other Ambulatory Visit: Payer: Self-pay | Admitting: Family Medicine

## 2022-11-07 ENCOUNTER — Encounter: Payer: Self-pay | Admitting: Family Medicine

## 2022-11-07 ENCOUNTER — Ambulatory Visit (INDEPENDENT_AMBULATORY_CARE_PROVIDER_SITE_OTHER): Payer: Medicare Other | Admitting: Family Medicine

## 2022-11-07 ENCOUNTER — Ambulatory Visit (HOSPITAL_BASED_OUTPATIENT_CLINIC_OR_DEPARTMENT_OTHER)
Admission: RE | Admit: 2022-11-07 | Discharge: 2022-11-07 | Disposition: A | Discharge status: Home / Self Care | Payer: Medicare Other | Source: Ambulatory Visit | Attending: Family Medicine | Admitting: Family Medicine

## 2022-11-07 VITALS — BP 121/80 | HR 78 | Temp 98.0°F | Ht 73.0 in | Wt 184.0 lb

## 2022-11-07 DIAGNOSIS — S92524A Nondisplaced fracture of medial phalanx of right lesser toe(s), initial encounter for closed fracture: Secondary | ICD-10-CM | POA: Diagnosis not present

## 2022-11-07 DIAGNOSIS — M79674 Pain in right toe(s): Secondary | ICD-10-CM | POA: Insufficient documentation

## 2022-11-07 DIAGNOSIS — E876 Hypokalemia: Secondary | ICD-10-CM

## 2022-11-07 LAB — BASIC METABOLIC PANEL
BUN: 13 mg/dL (ref 6–23)
CO2: 27 mEq/L (ref 19–32)
Calcium: 8.8 mg/dL (ref 8.4–10.5)
Chloride: 107 mEq/L (ref 96–112)
Creatinine, Ser: 0.96 mg/dL (ref 0.40–1.50)
GFR: 82.35 mL/min (ref >60.00)
Glucose, Bld: 104 mg/dL — ABNORMAL HIGH (ref 70–99)
Potassium: 4.8 mEq/L (ref 3.5–5.1)
Sodium: 140 mEq/L (ref 135–145)

## 2022-11-07 NOTE — Patient Instructions (Signed)
Ice/cold pack over area for 10-15 min twice daily.  OK to take Tylenol 1000 mg (2 extra strength tabs) or 975 mg (3 regular strength tabs) every 6 hours as needed.  We will be in touch with your X-ray results.   Give [place] 2-3 business days to get the results of your labs back.   Let us know if you need anything.

## 2022-11-07 NOTE — Progress Notes (Signed)
Musculoskeletal Exam  Patient: Daniel [name] DOB: 1955/09/18  DOS: 11/07/2022  SUBJECTIVE:  Chief Complaint:   Chief Complaint  Patient presents with   Follow-up    Medication Taking OTC for Calcium      Toe Pain    Daniel [name] is a 67 y.o.  male for evaluation and treatment of 4th toe pain pain.   Onset:  2 days ago. No inj or change in activity.  Location: 4th toe Character:  aching  Progression of issue:  is unchanged Associated symptoms: Swelling, bruising, slight pain with ambulation No redness or drainage Treatment: to date has been ice.   Neurovascular symptoms: no  Past Medical History:  Diagnosis Date   Anal fistula    recurrent   Benign localized prostatic hyperplasia with lower urinary tract symptoms (LUTS)    Bilateral lower extremity edema    feet   COPD with emphysema (HCC)    Diverticulosis of colon    ED (erectile dysfunction)    Generalized anxiety disorder    Hemorrhoids    History of colon polyps    History of diabetes mellitus, type II    10-10-2017  per pt no issues after wt loss surgery    History of pneumothorax    1983 & 1988  right spontaneous, treated w/ chest tube   Hyperlipidemia    Hypertension    MDD (major depressive disorder)    OSA (obstructive sleep apnea)    10-10-2017  not used cpap since 2016 after wt loss surgery 2015   Status post biliopancreatic diversion with duodenal switch    07-30-2013   @ [facility] for wt. loss surgery   Vitamin D deficiency    Wears glasses    Zinc deficiency 04/27/2016    Objective: VITAL SIGNS: BP 121/80 (BP Location: Left Arm, Patient Position: Sitting, Cuff Size: Normal)   Pulse 78   Temp 98 F (36.7 C) (Oral)   Ht 6\' 1"  (1.854 m)   Wt 184 lb (83.5 kg)   SpO2 99%   BMI 24.28 kg/m  Constitutional: Well formed, well developed. No acute distress. Thorax & Lungs: No accessory muscle use Musculoskeletal: 4th digit of R foot.   Normal active range of motion: yes.   Normal passive range of  motion: yes Tenderness to palpation: yes-over middle phalanx and PIP joint Deformity: No bony deformity Ecchymosis: yes Neurologic: Normal sensory function.  Gait appears normal. Psychiatric: Normal mood. Age appropriate judgment and insight. Alert & oriented x 3.    Assessment:  Toe pain, right - Plan: DG Toe 4th Right  Hypokalemia - Plan: Basic Metabolic Panel (BMET)  Plan: Check x-ray, ice, Tylenol.  Considered hard soled shoe but given lack of soreness when walking, decided to avoid for now.  Will ensure is not through the joint line.  If it is, we will refer to foot/ankle surgery. Follow-up on potassium from recent ER visit. The patient voiced understanding and agreement to the plan.   Jilda Roche Badger Lee, DO 11/07/22  11:59 AM

## 2022-11-09 ENCOUNTER — Encounter: Payer: Self-pay | Admitting: Family Medicine

## 2022-11-12 ENCOUNTER — Other Ambulatory Visit: Payer: Self-pay | Admitting: Family Medicine

## 2022-11-12 DIAGNOSIS — M79674 Pain in right toe(s): Secondary | ICD-10-CM

## 2022-11-12 NOTE — Addendum Note (Signed)
Addended by: Scharlene Gloss B on: 11/12/2022 07:44 AM   Modules accepted: Orders

## 2022-11-20 ENCOUNTER — Ambulatory Visit (INDEPENDENT_AMBULATORY_CARE_PROVIDER_SITE_OTHER): Payer: Medicare Other

## 2022-11-20 ENCOUNTER — Ambulatory Visit: Payer: Medicare Other | Admitting: Family Medicine

## 2022-11-20 VITALS — BP 122/78 | HR 82 | Ht 73.0 in | Wt 190.0 lb

## 2022-11-20 DIAGNOSIS — S62652A Nondisplaced fracture of medial phalanx of right middle finger, initial encounter for closed fracture: Secondary | ICD-10-CM | POA: Diagnosis not present

## 2022-11-20 DIAGNOSIS — S92521D Displaced fracture of medial phalanx of right lesser toe(s), subsequent encounter for fracture with routine healing: Secondary | ICD-10-CM | POA: Diagnosis not present

## 2022-11-20 DIAGNOSIS — S62652D Nondisplaced fracture of medial phalanx of right middle finger, subsequent encounter for fracture with routine healing: Secondary | ICD-10-CM

## 2022-11-20 DIAGNOSIS — M79674 Pain in right toe(s): Secondary | ICD-10-CM

## 2022-11-20 NOTE — Patient Instructions (Addendum)
Thank you for coming in today.   Continue current splint.   Recheck in about 3 weeks.   Let me know if you have any problems.

## 2022-11-20 NOTE — Progress Notes (Signed)
   Rubin Payor, PhD, LAT, ATC acting as a scribe for [name], MD.   Daniel SURGENER is a 67 y.o. male who presents to [facility] at [facility] today for f/u R 3rd finger fx and bilat knee pain. Pt was last seen by Dr. Denyse Amass on 10/08/22 and was given bilat knee steroid injections and his fingers was placed in a longer stax splint.  Today, pt reports R middle finger is still sore and swollen. He notes that his toe isn't very bothersome, but his PCP told him that it is fx.   Dx imaging: 09/06/22 R hand XR   Pertinent review of systems: No fevers or chills  Relevant historical information: Hypertension emphysema diabetes   Exam:  BP 122/78   Pulse 82   Ht 6\' 1"  (1.854 m)   Wt 190 lb (86.2 kg)   SpO2 98%   BMI 25.07 kg/m  General: Well Developed, well nourished, and in no acute distress.   MSK: Right third digit enlarged and swollen.  Mildly tender palpation at middle phalanx.  Extensor lag is present at DIP.    Lab and Radiology Results  X-ray images right hand third digit and right fourth toe personally and independently interpreted today.  Right hand third digit :oblique fracture present through the middle phalanx with minimal angulation with good callus formation.  Fracture line is still faintly visible.  Right fourth toe: No visible fracture is present.  Await formal radiology review  Assessment and Plan: 67 y.o. male with right hand fracture right third digit middle phalanx.  Fracture appears to be healing on x-ray and clinically.  Continue immobilization with STAX splint. Recheck in 3 weeks.   Possible toe fracture.  Fracture was seen on original x-ray August 21.  Clinically he is feeling better now.  I cannot see a fracture on x-ray myself.  Watchful waiting for now.   PDMP not reviewed this encounter. Orders Placed This Encounter  Procedures   DG Finger Middle Right    Standing Status:   Future    Number of Occurrences:   1    Standing  Expiration Date:   11/20/2023    Order Specific Question:   Reason for Exam (SYMPTOM  OR DIAGNOSIS REQUIRED)    Answer:   right 3rd finger fx    Order Specific Question:   Preferred imaging location?    Answer:   [facility]   No orders of the defined types were placed in this encounter.    Discussed warning signs or symptoms. Please see discharge instructions. Patient expresses understanding.   The above documentation has been reviewed and is accurate and complete [name], M.D.

## 2022-11-26 ENCOUNTER — Other Ambulatory Visit: Payer: Self-pay | Admitting: Family Medicine

## 2022-11-26 DIAGNOSIS — L649 Androgenic alopecia, unspecified: Secondary | ICD-10-CM

## 2022-11-28 ENCOUNTER — Encounter: Payer: Self-pay | Admitting: Family Medicine

## 2022-11-29 NOTE — Progress Notes (Signed)
Right middle finger x-ray shows a healing fracture.

## 2022-12-11 ENCOUNTER — Ambulatory Visit: Payer: Medicare Other | Admitting: Family Medicine

## 2022-12-11 ENCOUNTER — Encounter: Payer: Self-pay | Admitting: Family Medicine

## 2022-12-11 ENCOUNTER — Ambulatory Visit (INDEPENDENT_AMBULATORY_CARE_PROVIDER_SITE_OTHER): Payer: Medicare Other

## 2022-12-11 VITALS — Ht 73.0 in | Wt 192.4 lb

## 2022-12-11 DIAGNOSIS — S62652D Nondisplaced fracture of medial phalanx of right middle finger, subsequent encounter for fracture with routine healing: Secondary | ICD-10-CM

## 2022-12-11 DIAGNOSIS — S62622D Displaced fracture of medial phalanx of right middle finger, subsequent encounter for fracture with routine healing: Secondary | ICD-10-CM | POA: Diagnosis not present

## 2022-12-11 NOTE — Progress Notes (Signed)
   I, [name], CMA acting as a scribe for [name], MD.  Daniel [name] is a 67 y.o. male who presents to [facility] at [facility] today for Closed nondisplaced fracture of middle phalanx of right middle finger. Pt was last seen by Dr. Denyse Amass 11/20/22, XR was done, and pt was advised to continue immobilization with STAX.   Diagnostic Imaging:  11/20/22 - XR right 3rd finger  Pertinent review of systems: no fever or chills. Positive for macular ecchymosis on forearms.  Relevant historical information: Hypertension and diabetes   Exam:  Ht 6\' 1"  (1.854 m)   Wt 192 lb 6.4 oz (87.3 kg)   BMI 25.38 kg/m  General: Well Developed, well nourished, and in no acute distress.   MSK: Right third digit some swelling present at middle phalanx and DIP with noticeable extensor lag at DIP.  Strength is intact to extension.  Strength is intact to flexion.    Lab and Radiology Results  X-ray images right third digit obtained today personally independently interpreted. Healing oblique fracture through the middle third phalanx with some angulation that appears stable.  Callus formation is present.  Fracture line is still partially visible. Await formal radiology review    Assessment and Plan: 67 y.o. male with right third digit fracture.  Fracture occurred likely on or around June 20.  Original x-ray at the time did not show fracture and he was treated with splinting.  Subsequent x-rays about a month later did show his fracture.  He was treated with splinting including Stax splint. He is bothered by the continued extensor lag.  I do not think that conservative management will fix the extensor lag that he has and discussed surgical options.  He would like to least talk to a hand surgeon to discuss his options.  Referred to hand surgery.  Happy to arrange a second opinion if needed.  Check back with me as needed.   PDMP not reviewed this encounter. Orders Placed This  Encounter  Procedures   DG Finger Middle Right    Standing Status:   Future    Number of Occurrences:   1    Standing Expiration Date:   06/10/2023    Order Specific Question:   Reason for Exam (SYMPTOM  OR DIAGNOSIS REQUIRED)    Answer:   right 3rd finger pain    Order Specific Question:   Preferred imaging location?    Answer:   [facility]   Ambulatory referral to Orthopedic Surgery    Referral Priority:   Routine    Referral Type:   Surgical    Referral Reason:   Specialty Services Required    Requested Specialty:   Orthopedic Surgery    Number of Visits Requested:   1   No orders of the defined types were placed in this encounter.    Discussed warning signs or symptoms. Please see discharge instructions. Patient expresses understanding.   The above documentation has been reviewed and is accurate and complete [name], M.D.

## 2022-12-11 NOTE — Patient Instructions (Addendum)
Thank you for coming in today.   You should hear from hand surgery shortly about an appointment.   Please let me know if you do not hear from them or would like a second opinion from a different hand surgeon.   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

## 2022-12-12 ENCOUNTER — Telehealth: Payer: Self-pay | Admitting: Family Medicine

## 2022-12-12 NOTE — Telephone Encounter (Signed)
Pt called & stated that he has been having chest pain for multiple weeks. I transferred him to speak with a triage nurse.

## 2022-12-12 NOTE — Telephone Encounter (Signed)
The patient has already  been scheduled with PCP for Monday. He does not think it is heart related, he thinks he has a pulled muscle. He states if he thought it was heart related he is 2 mins. From [facility] and would go there at once, but he is convinced is not that.  Did offer appt today at 4:15, but he would prefer to come on Monday

## 2022-12-12 NOTE — Telephone Encounter (Signed)
Do you want to see him today. Triage is talking to him

## 2022-12-14 DIAGNOSIS — S62622A Displaced fracture of medial phalanx of right middle finger, initial encounter for closed fracture: Secondary | ICD-10-CM | POA: Diagnosis not present

## 2022-12-17 ENCOUNTER — Ambulatory Visit (INDEPENDENT_AMBULATORY_CARE_PROVIDER_SITE_OTHER): Payer: Medicare Other | Admitting: Family Medicine

## 2022-12-17 ENCOUNTER — Encounter: Payer: Self-pay | Admitting: Family Medicine

## 2022-12-17 ENCOUNTER — Other Ambulatory Visit: Payer: Self-pay | Admitting: Family Medicine

## 2022-12-17 VITALS — BP 134/86 | HR 64 | Temp 98.2°F | Ht 73.0 in | Wt 188.2 lb

## 2022-12-17 DIAGNOSIS — M722 Plantar fascial fibromatosis: Secondary | ICD-10-CM | POA: Diagnosis not present

## 2022-12-17 DIAGNOSIS — L089 Local infection of the skin and subcutaneous tissue, unspecified: Secondary | ICD-10-CM | POA: Diagnosis not present

## 2022-12-17 DIAGNOSIS — R0789 Other chest pain: Secondary | ICD-10-CM | POA: Diagnosis not present

## 2022-12-17 DIAGNOSIS — Z23 Encounter for immunization: Secondary | ICD-10-CM

## 2022-12-17 MED ORDER — CLINDAMYCIN HCL 300 MG PO CAPS: 300.0000 mg | ORAL_CAPSULE | Freq: Three times a day (TID) | ORAL | 0 refills | Status: DC

## 2022-12-17 NOTE — Patient Instructions (Addendum)
Ice/cold pack over area for 10-15 min twice daily.  Heat (pad or rice pillow in microwave) over affected area, 10-15 minutes twice daily.   OK to take Tylenol 1000 mg (2 extra strength tabs) or 975 mg (3 regular strength tabs) every 6 hours as needed.  Consider Powerstep insoles. There are very quality over the counter inserts. Shop around online and in stores. Dr. Margart Sickles is a cheaper alternative, though is not as high of quality.   Consider a [place] sock to wear at night.  Eat a cup of yogurt daily while on the clindamycin.   Let us know if you need anything.  Plantar Fasciitis Stretches/exercises Do exercises exactly as told by your health care provider and adjust them as directed. It is normal to feel mild stretching, pulling, tightness, or discomfort as you do these exercises, but you should stop right away if you feel sudden pain or your pain gets worse.   Stretching and range of motion exercises These exercises warm up your muscles and joints and improve the movement and flexibility of your foot. These exercises also help to relieve pain.  Exercise A: Plantar fascia stretch Sit with your left / right leg crossed over your opposite knee. Hold your heel with one hand with that thumb near your arch. With your other hand, hold your toes and gently pull them back toward the top of your foot. You should feel a stretch on the bottom of your toes or your foot or both. Hold this stretch for 30 seconds. Slowly release your toes and return to the starting position. Repeat 2 times. Complete this exercise 3 times per week.  Exercise B: Gastroc, standing Stand with your hands against a wall. Extend your left / right leg behind you, and bend your front knee slightly. Keeping your heels on the floor and keeping your back knee straight, shift your weight toward the wall without arching your back. You should feel a gentle stretch in your left / right calf. Hold this position for 30  seconds. Repeat 2 times. Complete this exercise 3 times a week. Exercise C: Soleus, standing Stand with your hands against a wall. Extend your left / right leg behind you, and bend your front knee slightly. Keeping your heels on the floor, bend your back knee and slightly shift your weight over the back leg. You should feel a gentle stretch deep in your calf. Hold this position for 30 seconds. Repeat 2 times. Complete this exercise 3 times per week. Exercise D: Gastrocsoleus, standing Stand with the ball of your left / right foot on a step. The ball of your foot is on the walking surface, right under your toes. Keep your other foot firmly on the same step. Hold onto the wall or a railing for balance. Slowly lift your other foot, allowing your body weight to press your heel down over the edge of the step. You should feel a stretch in your left / right calf. Hold this position for 30 seconds. Return both feet to the step. Repeat this exercise with a slight bend in your left / right knee. Repeat 2 times with your left / right knee straight and 2times with your left / right knee bent. Complete this exercise 3 times a week.  Balance exercise This exercise builds your balance and strength control of your arch to help take pressure off your plantar fascia. Exercise E: Single leg stand Without shoes, stand near a railing or in a doorway. You may hold  onto the railing or door frame as needed. Stand on your left / right foot. Keep your big toe down on the floor and try to keep your arch lifted. Do not let your foot roll inward. Hold this position for 30 seconds. If this exercise is too easy, you can try it with your eyes closed or while standing on a pillow. Repeat 2 times. Complete this exercise 3 times per week. This information is not intended to replace advice given to you by your health care provider. Make sure you discuss any questions you have with your health care provider. Document  Released: 03/05/2005 Document Revised: 11/08/2015 Document Reviewed: 01/17/2015 Elsevier Interactive Patient Education  2017 [employer]  Pectoralis Major Rehab Ask your health care provider which exercises are safe for you. Do exercises exactly as told by your health care provider and adjust them as directed. It is normal to feel mild stretching, pulling, tightness, or discomfort as you do these exercises, but you should stop right away if you feel sudden pain or your pain gets worse. Do not begin these exercises until told by your health care provider. Stretching and range of motion exercises These exercises warm up your muscles and joints and improve the movement and flexibility of your shoulder. These exercises can also help to relieve pain, numbness, and tingling. Exercise A: Pendulum  Stand near a wall or a surface that you can hold onto for balance. Bend at the waist and let your left / right arm hang straight down. Use your other arm to keep your balance. Relax your arm and shoulder muscles, and move your hips and your trunk so your left / right arm swings freely. Your arm should swing because of the motion of your body, not because you are using your arm or shoulder muscles. Keep moving so your arm swings in the following directions, as told by your health care provider: Side to side. Forward and backward. In clockwise and counterclockwise circles. Slowly return to the starting position. Repeat 2 times. Complete this exercise 3 times per week. Exercise B: Abduction, standing Stand and hold a broomstick, a cane, or a similar object. Place your hands a little more than shoulder-width apart on the object. Your left / right hand should be palm-up, and your other hand should be palm-down. While keeping your elbow straight and your shoulder muscles relaxed, push the stick across your body toward your left / right side. Raise your left / right arm to the side of your body and then over your  head until you feel a stretch in your shoulder. Stop when you reach the angle that is recommended by your health care provider. Avoid shrugging your shoulder while you raise your arm. Keep your shoulder blade tucked down toward the middle of your spine. Hold for 10 seconds. Slowly return to the starting position. Repeat 2 times. Complete this exercise 3 times per week. Exercise C: Wand flexion, supine  Lie on your back. You may bend your knees for comfort. Hold a broomstick, a cane, or a similar object so that your hands are about shoulder-width apart on the object. Your palms should face toward your feet. Raise your left / right arm in front of your face, then behind your head (toward the floor). Use your other hand to help you do this. Stop when you feel a gentle stretch in your shoulder, or when you reach the angle that is recommended by your health care provider. Hold for 3 seconds. Use the  broomstick and your other arm to help you return your left / right arm to the starting position. Repeat 2 times. Complete this exercise 3 times per week. Exercise D: Wand shoulder external rotation Stand and hold a broomstick, a cane, or a similar object so your hands are about shoulder-width apart on the object. Start with your arms hanging down, then bend both elbows to an "L" shape (90 degrees). Keep your left / right elbow at your side. Use your other hand to push the stick so your left / right forearm moves away from your body, out to your side. Keep your left / right elbow bent to 90 degrees and keep it against your side. Stop when you feel a gentle stretch in your shoulder, or when you reach the angle recommended by your health care provider. Hold for 10 seconds. Use the stick to help you return your left / right arm to the starting position. Repeat 2 times. Complete this exercise 3 times per week. Strengthening exercises These exercises build strength and endurance in your shoulder. Endurance  is the ability to use your muscles for a long time, even after your muscles get tired. Exercise E: Scapular protraction, standing Stand so you are facing a wall. Place your feet about one arm-length away from the wall. Place your hands on the wall and straighten your elbows. Keep your hands on the wall as you push your upper back away from the wall. You should feel your shoulder blades sliding forward. Keep your elbows and your head still. If you are not sure that you are doing this exercise correctly, ask your health care provider for more instructions. Hold for 3 seconds. Slowly return to the starting position. Let your muscles relax completely before you repeat this exercise. Repeat 2 times. Complete this exercise 3 times per week. Exercise F: Shoulder blade squeezes  (scapular retraction) Sit with good posture in a stable chair. Do not let your back touch the back of the chair. Your arms should be at your sides with your elbows bent. You may rest your forearms on a pillow if that is more comfortable. Squeeze your shoulder blades together. Bring them down and back. Keep your shoulders level. Do not lift your shoulders up toward your ears. Hold for 3 seconds. Return to the starting position. Repeat 2 times. Complete this exercise 3 times per week. This information is not intended to replace advice given to you by your health care provider. Make sure you discuss any questions you have with your health care provider. Document Released: 03/05/2005 Document Revised: 12/15/2015 Document Reviewed: 11/21/2014 Elsevier Interactive Patient Education  [employer].

## 2022-12-17 NOTE — Progress Notes (Signed)
Musculoskeletal Exam  Patient: Daniel [name] DOB: [birth year]  DOS: 12/17/2022  SUBJECTIVE:  Chief Complaint:   Chief Complaint  Patient presents with   pulled muscle        Foot Pain    Left foot Abscess on back side      ARMARI POINT is a 67 y.o.  male for evaluation and treatment of L heel pain.   Onset:  2 weeks ago. No inj or change in activity.  Location: Left heel Character:  sharp  Progression of issue:  is unchanged Associated symptoms: Difficulty walking Treatment: to date has been none.   Neurovascular symptoms: no  Skin 5 d of swelling and pain over the L portion of buttock. Recurrent abscess. No drainage or fevers. Took doxy which does not seem to be helping.   Duration of issue: several days Quality: sharp Palliation: None Provocation: touching it Radiation: to the R Duration of chest pain: several hours Associated symptoms: no SOB, skin changes Cardiac history: none Family heart history: none Smoker? Yes  Past Medical History:  Diagnosis Date   Anal fistula    recurrent   Benign localized prostatic hyperplasia with lower urinary tract symptoms (LUTS)    Bilateral lower extremity edema    feet   COPD with emphysema (HCC)    Diverticulosis of colon    ED (erectile dysfunction)    Generalized anxiety disorder    Hemorrhoids    History of colon polyps    History of diabetes mellitus, type II    10-10-2017  per pt no issues after wt loss surgery    History of pneumothorax    1983 & 1988  right spontaneous, treated w/ chest tube   Hyperlipidemia    Hypertension    MDD (major depressive disorder)    OSA (obstructive sleep apnea)    10-10-2017  not used cpap since 2016 after wt loss surgery 2015   Status post biliopancreatic diversion with duodenal switch    07-30-2013   @ [facility] for wt. loss surgery   Vitamin D deficiency    Wears glasses    Zinc deficiency 04/27/2016    Objective: VITAL SIGNS: BP 134/86 (BP Location: Right Arm, Cuff Size:  Normal)   Pulse 64   Temp 98.2 F (36.8 C) (Oral)   Ht 6\' 1"  (1.854 m)   Wt 188 lb 4 oz (85.4 kg)   SpO2 99%   BMI 24.84 kg/m  Constitutional: Well formed, well developed. No acute distress. Heart: RRR, no lower extremity edema Skin: Slightly indurated and red/violet patch over the right proximal medial gluteal region with mild TTP.  Slight warmth to it.  There is no fluctuance or drainage. Thorax & Lungs: No accessory muscle use Musculoskeletal: Left heel.   Tenderness to palpation: Yes over proximal origin of the plantar fascia Deformity: no Ecchymosis: no Tests positive:  Tests negative: Neurologic: Normal sensory function. No focal deficits noted. DTR's equal and symmetric in LE's. No clonus. Psychiatric: Normal mood. Age appropriate judgment and insight. Alert & oriented x 3.    Assessment:  Plantar fasciitis  Skin infection  Chest wall pain  Need for influenza vaccination - Plan: Flu Vaccine Trivalent High Dose (Fluad)  Plan: Arch support, [place] sock, stretches/exercises, heat, ice, Tylenol.  Consider injection if no improvement in next 4 weeks. Question cellulitis over recurrent abscess/fistula.  Stop doxycycline.  Start clindamycin 300 mg 3 times daily for 7 days.  Add a yogurt daily while on this. Stretches and exercises,  heat, ice, Tylenol. Flu shot today. F/u as originally scheduled. The patient voiced understanding and agreement to the plan.  I spent 32 minutes with the patient discussing the above plans in addition to reviewing his chart on the same day of the visit.  Jilda Roche [place], DO 12/17/22  5:06 PM

## 2022-12-31 ENCOUNTER — Ambulatory Visit: Payer: Medicare Other | Admitting: Podiatry

## 2022-12-31 ENCOUNTER — Encounter: Payer: Self-pay | Admitting: Podiatry

## 2022-12-31 VITALS — Ht 73.0 in | Wt 188.0 lb

## 2022-12-31 DIAGNOSIS — M722 Plantar fascial fibromatosis: Secondary | ICD-10-CM | POA: Diagnosis not present

## 2022-12-31 MED ORDER — BETAMETHASONE SOD PHOS & ACET 6 (3-3) MG/ML IJ SUSP
3.0000 mg | Freq: Once | INTRAMUSCULAR | Status: AC
Start: 2022-12-31 — End: 2022-12-31
  Administered 2022-12-31: 3 mg via INTRA_ARTICULAR

## 2022-12-31 MED ORDER — METHYLPREDNISOLONE 4 MG PO TBPK: ORAL_TABLET | ORAL | 0 refills | Status: DC

## 2022-12-31 MED ORDER — MELOXICAM 15 MG PO TABS: 15.0000 mg | ORAL_TABLET | Freq: Every day | ORAL | 1 refills | Status: DC

## 2022-12-31 NOTE — Progress Notes (Signed)
Middle finger x-ray shows healing fracture.

## 2022-12-31 NOTE — Progress Notes (Signed)
   Chief Complaint  Patient presents with   Plantar Warts    Rm9: patient here for plantar wart right foot    Subjective: 67 y.o. male presenting to the office today for acute onset of pain and tenderness associated to the left heel.  Idiopathic onset.  Denies a history of injury.  Patient was seen by his PCP who diagnosed him with plantar fasciitis.  Presenting for further treatment evaluation   Past Medical History:  Diagnosis Date   Anal fistula    recurrent   Benign localized prostatic hyperplasia with lower urinary tract symptoms (LUTS)    Bilateral lower extremity edema    feet   COPD with emphysema (HCC)    Diverticulosis of colon    ED (erectile dysfunction)    Generalized anxiety disorder    Hemorrhoids    History of colon polyps    History of diabetes mellitus, type II    10-10-2017  per pt no issues after wt loss surgery    History of pneumothorax    1983 & 1988  right spontaneous, treated w/ chest tube   Hyperlipidemia    Hypertension    MDD (major depressive disorder)    OSA (obstructive sleep apnea)    10-10-2017  not used cpap since 2016 after wt loss surgery 2015   Status post biliopancreatic diversion with duodenal switch    07-30-2013   @ [facility] for wt. loss surgery   Vitamin D deficiency    Wears glasses    Zinc deficiency 04/27/2016     Objective: Physical Exam General: The patient is alert and oriented x3 in no acute distress.  Dermatology: Skin is warm, dry and supple bilateral lower extremities. Negative for open lesions or macerations bilateral.   Vascular: Dorsalis Pedis and Posterior Tibial pulses palpable bilateral.  Capillary fill time is immediate to all digits.  Neurological: Grossly intact via light touch  Musculoskeletal: Tenderness to palpation to the plantar aspect of the left heel along the plantar fascia. All other joints range of motion within normal limits bilateral. Strength 5/5 in all groups bilateral.   Assessment: 1. Plantar  fasciitis left foot  Plan of Care:  -Patient evaluated. Xrays reviewed.   -Injection of 0.5cc Celestone soluspan injected into the left plantar fascia.  -Rx for Medrol Dose Pak placed -Rx for Meloxicam 15 mg daily ordered for patient. -Patient admits to walking around the house barefoot.  Advised against this.  Recommend soft supportive tennis shoes even around the house -Return to clinic as needed   Felecia Shelling, DPM [facility]  Dr. Felecia Shelling, DPM    [address] [place], [place] [address]                Office [phone]  Fax [phone]

## 2023-01-10 DIAGNOSIS — F330 Major depressive disorder, recurrent, mild: Secondary | ICD-10-CM | POA: Diagnosis not present

## 2023-01-10 DIAGNOSIS — F5101 Primary insomnia: Secondary | ICD-10-CM | POA: Diagnosis not present

## 2023-01-10 DIAGNOSIS — F411 Generalized anxiety disorder: Secondary | ICD-10-CM | POA: Diagnosis not present

## 2023-01-27 ENCOUNTER — Other Ambulatory Visit: Payer: Self-pay | Admitting: Family Medicine

## 2023-01-27 DIAGNOSIS — K219 Gastro-esophageal reflux disease without esophagitis: Secondary | ICD-10-CM

## 2023-01-28 DIAGNOSIS — E291 Testicular hypofunction: Secondary | ICD-10-CM | POA: Diagnosis not present

## 2023-01-29 ENCOUNTER — Ambulatory Visit
Admission: RE | Admit: 2023-01-29 | Discharge: 2023-01-29 | Disposition: A | Discharge status: Home / Self Care | Payer: Medicare Other | Source: Ambulatory Visit | Attending: Family Medicine | Admitting: Family Medicine

## 2023-01-29 DIAGNOSIS — F17210 Nicotine dependence, cigarettes, uncomplicated: Secondary | ICD-10-CM

## 2023-01-29 DIAGNOSIS — Z87891 Personal history of nicotine dependence: Secondary | ICD-10-CM

## 2023-01-29 DIAGNOSIS — Z122 Encounter for screening for malignant neoplasm of respiratory organs: Secondary | ICD-10-CM

## 2023-02-08 ENCOUNTER — Encounter: Payer: Self-pay | Admitting: Family Medicine

## 2023-02-08 ENCOUNTER — Ambulatory Visit (INDEPENDENT_AMBULATORY_CARE_PROVIDER_SITE_OTHER): Payer: Medicare Other | Admitting: Family Medicine

## 2023-02-08 VITALS — BP 126/78 | HR 84 | Temp 98.0°F | Resp 16 | Ht 73.0 in | Wt 187.2 lb

## 2023-02-08 DIAGNOSIS — M722 Plantar fascial fibromatosis: Secondary | ICD-10-CM | POA: Diagnosis not present

## 2023-02-08 MED ORDER — METHYLPREDNISOLONE ACETATE 40 MG/ML IJ SUSP
40.0000 mg | Freq: Once | INTRAMUSCULAR | Status: AC
Start: 2023-02-08 — End: 2023-02-08
  Administered 2023-02-08: 40 mg via INTRA_ARTICULAR

## 2023-02-08 MED ORDER — METHYLPREDNISOLONE 4 MG PO TBPK: ORAL_TABLET | ORAL | 0 refills | Status: DC

## 2023-02-08 NOTE — Progress Notes (Signed)
Musculoskeletal Exam  Patient: Daniel [name] DOB: 1956/01/13  DOS: 02/08/2023  SUBJECTIVE:  Chief Complaint:   Chief Complaint  Patient presents with   Foot Pain    Foot pain    Daniel [name] is a 67 y.o.  male for evaluation and treatment of L heel pain.   Onset: 1 d ago. No inj or change in activity.  Location: L heel Character:  sharp  Progression of issue:  is unchanged Associated symptoms: difficulty walking No bruising, redness, swelling Treatment: to date has been rest, ice, OTC NSAIDS, oral steroids, and home exercises.   Neurovascular symptoms: no  Past Medical History:  Diagnosis Date   Anal fistula    recurrent   Benign localized prostatic hyperplasia with lower urinary tract symptoms (LUTS)    Bilateral lower extremity edema    feet   COPD with emphysema (HCC)    Diverticulosis of colon    ED (erectile dysfunction)    Generalized anxiety disorder    Hemorrhoids    History of colon polyps    History of diabetes mellitus, type II    10-10-2017  per pt no issues after wt loss surgery    History of pneumothorax    1983 & 1988  right spontaneous, treated w/ chest tube   Hyperlipidemia    Hypertension    MDD (major depressive disorder)    OSA (obstructive sleep apnea)    10-10-2017  not used cpap since 2016 after wt loss surgery 2015   Status post biliopancreatic diversion with duodenal switch    07-30-2013   @ [facility] for wt. loss surgery   Vitamin D deficiency    Wears glasses    Zinc deficiency 04/27/2016    Objective: VITAL SIGNS: BP 126/78 (BP Location: Left Arm, Patient Position: Sitting, Cuff Size: Normal)   Pulse 84   Temp 98 F (36.7 C) (Oral)   Resp 16   Ht 6\' 1"  (1.854 m)   Wt 187 lb 3.2 oz (84.9 kg)   SpO2 97%   BMI 24.70 kg/m  Constitutional: Well formed, well developed. No acute distress. Thorax & Lungs: No accessory muscle use Musculoskeletal: L heel.   Tenderness to palpation: yes; over prox insertion of the plantar  fascia Deformity: no Ecchymosis: no Neurologic: Normal sensory function.  Psychiatric: Normal mood. Age appropriate judgment and insight. Alert & oriented x 3.    Procedure note; plantar fascia injection Verbal consent obtained The area of interest was palpated and marked with an otoscope speculum It was then cleaned with alcohol x1 Free spray was then used for topical anesthesia 20 mg of Depo-Medrol with 1 mL of 2% lidocaine without epinephrine was injected with a 30-gauge needle The area was then bandaged The patient tolerated the procedure well There were no immediate complications noted  Assessment:  Plantar fasciitis - Plan: methylPREDNISolone acetate (DEPO-MEDROL) injection 40 mg, PR INJECTION 1 TENDON SHEATH/LIGAMENT APONEUROSIS  Plan: Injection today. Medrol Dosepak whil the steroid kicks in. Good arch support rec'd. Stretches/exercises, heat, ice, Tylenol.  F/u as originally scheduled. The patient voiced understanding and agreement to the plan.   Daniel Roche [place], DO 02/08/23  9:18 AM

## 2023-02-08 NOTE — Patient Instructions (Signed)
Ice/cold pack over area for 10-15 min twice daily.  OK to take Tylenol 1000 mg (2 extra strength tabs) or 975 mg (3 regular strength tabs) every 6 hours as needed.  Continue to be diligent with the stretches/exercise.   Let us know if you need anything.

## 2023-02-13 NOTE — Telephone Encounter (Signed)
Spoke with [facility] radiology reading room and was told they will move it up the list and they had no record of my previous call.

## 2023-02-20 ENCOUNTER — Other Ambulatory Visit: Payer: Self-pay

## 2023-02-20 DIAGNOSIS — Z87891 Personal history of nicotine dependence: Secondary | ICD-10-CM

## 2023-02-20 DIAGNOSIS — F17210 Nicotine dependence, cigarettes, uncomplicated: Secondary | ICD-10-CM

## 2023-02-20 DIAGNOSIS — Z122 Encounter for screening for malignant neoplasm of respiratory organs: Secondary | ICD-10-CM

## 2023-02-23 ENCOUNTER — Other Ambulatory Visit: Payer: Self-pay | Admitting: Family Medicine

## 2023-02-23 DIAGNOSIS — L089 Local infection of the skin and subcutaneous tissue, unspecified: Secondary | ICD-10-CM

## 2023-03-06 ENCOUNTER — Ambulatory Visit (INDEPENDENT_AMBULATORY_CARE_PROVIDER_SITE_OTHER): Payer: Medicare Other | Admitting: Family Medicine

## 2023-03-06 ENCOUNTER — Encounter: Payer: Self-pay | Admitting: Family Medicine

## 2023-03-06 VITALS — BP 136/78 | HR 74 | Temp 98.0°F | Resp 16 | Ht 73.0 in | Wt 186.4 lb

## 2023-03-06 DIAGNOSIS — I2583 Coronary atherosclerosis due to lipid rich plaque: Secondary | ICD-10-CM | POA: Diagnosis not present

## 2023-03-06 DIAGNOSIS — I2510 Atherosclerotic heart disease of native coronary artery without angina pectoris: Secondary | ICD-10-CM | POA: Insufficient documentation

## 2023-03-06 MED ORDER — ROSUVASTATIN CALCIUM 20 MG PO TABS: 20.0000 mg | ORAL_TABLET | Freq: Every day | ORAL | 3 refills | Status: AC

## 2023-03-06 MED ORDER — DIAZEPAM 5 MG PO TABS: 5.0000 mg | ORAL_TABLET | Freq: Two times a day (BID) | ORAL | 2 refills | Status: DC | PRN

## 2023-03-06 NOTE — Progress Notes (Signed)
Chief Complaint  Patient presents with   Follow-up    Follow up    Subjective: Patient is a 67 y.o. male here for f/u of a scan.  Patient recently had his lung cancer screening.  The image reportedly showed calcification in his coronary arteries.  He is not on a statin currently.  Diet is fair.  He does not exercise routinely.  No chest pain or shortness of breath.  He does have a family history of heart disease.  He has never had a stroke or heart attack.  Past Medical History:  Diagnosis Date   Anal fistula    recurrent   Benign localized prostatic hyperplasia with lower urinary tract symptoms (LUTS)    Bilateral lower extremity edema    feet   COPD with emphysema (HCC)    Diverticulosis of colon    ED (erectile dysfunction)    Generalized anxiety disorder    Hemorrhoids    History of colon polyps    History of diabetes mellitus, type II    10-10-2017  per pt no issues after wt loss surgery    History of pneumothorax    1983 & 1988  right spontaneous, treated w/ chest tube   Hyperlipidemia    Hypertension    MDD (major depressive disorder)    OSA (obstructive sleep apnea)    10-10-2017  not used cpap since 2016 after wt loss surgery 2015   Status post biliopancreatic diversion with duodenal switch    07-30-2013   @ [facility] for wt. loss surgery   Vitamin D deficiency    Wears glasses    Zinc deficiency 04/27/2016    Objective: BP 136/78   Pulse 74   Temp 98 F (36.7 C) (Oral)   Resp 16   Ht 6\' 1"  (1.854 m)   Wt 186 lb 6.4 oz (84.6 kg)   SpO2 96%   BMI 24.59 kg/m  General: Awake, appears stated age Heart: RRR, no LE edema Lungs: CTAB, no rales, wheezes or rhonchi. No accessory muscle use Psych: Age appropriate judgment and insight, normal affect and mood  Assessment and Plan: Coronary artery disease due to lipid rich plaque - Plan: Lipid panel, Hepatic function panel, rosuvastatin (CRESTOR) 20 MG tablet  Primary prevention so aspirin is not indicated.  Start  Crestor 20 mg daily.  Counseled on diet and exercise.  Recheck labs in 6 weeks. The patient voiced understanding and agreement to the plan.  Jilda Roche Eunice, DO 03/06/23  2:49 PM

## 2023-03-06 NOTE — Patient Instructions (Addendum)
Keep the diet clean and stay active.  Let us know if you need anything. 

## 2023-03-11 ENCOUNTER — Other Ambulatory Visit: Payer: Self-pay | Admitting: Family Medicine

## 2023-03-11 DIAGNOSIS — L649 Androgenic alopecia, unspecified: Secondary | ICD-10-CM

## 2023-03-18 ENCOUNTER — Other Ambulatory Visit: Payer: Self-pay | Admitting: Family Medicine

## 2023-03-19 ENCOUNTER — Other Ambulatory Visit: Payer: Self-pay | Admitting: *Deleted

## 2023-03-19 DIAGNOSIS — D509 Iron deficiency anemia, unspecified: Secondary | ICD-10-CM

## 2023-03-21 ENCOUNTER — Inpatient Hospital Stay: Payer: Medicare Other | Attending: Hematology and Oncology

## 2023-03-21 DIAGNOSIS — D509 Iron deficiency anemia, unspecified: Secondary | ICD-10-CM | POA: Diagnosis not present

## 2023-03-21 LAB — CMP (CANCER CENTER ONLY)
ALT: 52 U/L — ABNORMAL HIGH (ref 0–44)
AST: 60 U/L — ABNORMAL HIGH (ref 15–41)
Albumin: 4 g/dL (ref 3.5–5.0)
Alkaline Phosphatase: 253 U/L — ABNORMAL HIGH (ref 38–126)
Anion gap: 4 — ABNORMAL LOW (ref 5–15)
BUN: 12 mg/dL (ref 8–23)
CO2: 28 mmol/L (ref 22–32)
Calcium: 8.7 mg/dL — ABNORMAL LOW (ref 8.9–10.3)
Chloride: 108 mmol/L (ref 98–111)
Creatinine: 0.9 mg/dL (ref 0.61–1.24)
GFR, Estimated: >60 mL/min (ref >60)
Glucose, Bld: 100 mg/dL — ABNORMAL HIGH (ref 70–99)
Potassium: 4.3 mmol/L (ref 3.5–5.1)
Sodium: 140 mmol/L (ref 135–145)
Total Bilirubin: 0.6 mg/dL (ref 0.0–1.2)
Total Protein: 6.4 g/dL — ABNORMAL LOW (ref 6.5–8.1)

## 2023-03-21 LAB — CBC WITH DIFFERENTIAL (CANCER CENTER ONLY)
Abs Immature Granulocytes: 0 10*3/uL (ref 0.00–0.07)
Basophils Absolute: 0 10*3/uL (ref 0.0–0.1)
Basophils Relative: 1 %
Eosinophils Absolute: 0.1 10*3/uL (ref 0.0–0.5)
Eosinophils Relative: 2 %
HCT: 47.8 % (ref 39.0–52.0)
Hemoglobin: 16.6 g/dL (ref 13.0–17.0)
Immature Granulocytes: 0 %
Lymphocytes Relative: 17 %
Lymphs Abs: 0.9 10*3/uL (ref 0.7–4.0)
MCH: 32.3 pg (ref 26.0–34.0)
MCHC: 34.7 g/dL (ref 30.0–36.0)
MCV: 93 fL (ref 80.0–100.0)
Monocytes Absolute: 0.4 10*3/uL (ref 0.1–1.0)
Monocytes Relative: 7 %
Neutro Abs: 3.6 10*3/uL (ref 1.7–7.7)
Neutrophils Relative %: 73 %
Platelet Count: 189 10*3/uL (ref 150–400)
RBC: 5.14 MIL/uL (ref 4.22–5.81)
RDW: 12.8 % (ref 11.5–15.5)
WBC Count: 5 10*3/uL (ref 4.0–10.5)
nRBC: 0 % (ref 0.0–0.2)

## 2023-03-21 LAB — IRON AND IRON BINDING CAPACITY (CC-WL,HP ONLY)
Iron: 111 ug/dL (ref 45–182)
Saturation Ratios: 24 % (ref 17.9–39.5)
TIBC: 462 ug/dL — ABNORMAL HIGH (ref 250–450)
UIBC: 351 ug/dL (ref 117–376)

## 2023-03-21 LAB — FERRITIN: Ferritin: 37 ng/mL (ref 24–336)

## 2023-03-25 ENCOUNTER — Other Ambulatory Visit: Payer: Self-pay | Admitting: Family Medicine

## 2023-03-27 ENCOUNTER — Inpatient Hospital Stay: Payer: Medicare Other | Admitting: Hematology and Oncology

## 2023-04-02 ENCOUNTER — Inpatient Hospital Stay (HOSPITAL_BASED_OUTPATIENT_CLINIC_OR_DEPARTMENT_OTHER): Payer: Medicare Other | Admitting: Hematology and Oncology

## 2023-04-02 ENCOUNTER — Telehealth: Payer: Self-pay

## 2023-04-02 DIAGNOSIS — D509 Iron deficiency anemia, unspecified: Secondary | ICD-10-CM

## 2023-04-02 NOTE — Telephone Encounter (Signed)
 Recheck LFT's at his next visit

## 2023-04-02 NOTE — Progress Notes (Signed)
 HEMATOLOGY-ONCOLOGY TELEPHONE VISIT PROGRESS NOTE  I connected with our patient on 04/02/23 at  9:00 AM EST by telephone and verified that I am speaking with the correct person using two identifiers.  I discussed the limitations, risks, security and privacy concerns of performing an evaluation and management service by telephone and the availability of in person appointments.  I also discussed with the patient that there may be a patient responsible charge related to this service. The patient expressed understanding and agreed to proceed.   History of Present Illness: Follow-up of iron  deficiency anemia  History of Present Illness   The patient, with a history of biliary surgery, presents for a follow-up on his iron  levels. He reports no issues related to the surgery, which was performed ten years ago. The only consequence of the surgery is vitamin deficiencies, which are managed with regular vitamin supplementation as advised by his primary care physician. He has not started any new medications recently, except for occasional antibiotics and a recent addition of statins due to discovered plaque buildup in his chest arteries.        REVIEW OF SYSTEMS:   Constitutional: Denies fevers, chills or abnormal weight loss All other systems were reviewed with the patient and are negative. Observations/Objective:     Assessment Plan:  Iron  deficiency anemia 06/15/19: Ferritin 4.9, Iron  sat: 7%, Folate 2.5, 03/25/20: Ferritin 7.5, Iron  sat: 7.5%, B 12 492, Folate 5.9, Hb 12.8, MCV 90, RDW 15 09/28/2020: Hemoglobin 14.5, platelets 122, ANC 1.3, ferritin 53 (improved from 7.5), iron  saturation 27% (improved from 7.5) 04/05/2021: Ferritin 9, iron  saturation 16%, hemoglobin 15  07/05/2021: Hemoglobin 15.1, MCV 93.6, platelets 202, iron  saturation 20%, TIBC 414, ferritin 16 08/20/2022: Ferritin 7.1, TIBC 516, iron  saturation 10% 03/21/2023: Ferritin 37, iron  saturation 24%, TIBC 462, hemoglobin 16.6, AST 60, ALT  52, alk phos 253, creatinine 0.9   Status post biliopancreatic diversion with duodenal switch 2015 at [facility]   IV iron : March 2022, July 2024 No additional IV iron  necessary at this time. Slightly elevated LFTs: Uncertain if it is related to his prior bili of pancreatic diversion surgery.  He will follow-up with his primary care physician.   --------------------------------- Assessment and Plan    Iron  Deficiency Hemoglobin and iron  levels are within normal limits. Ferritin is above the desired level of 10, currently at 37. Iron  saturation is good at 24%. -Recheck labs in three months.  Liver Function Slightly elevated AST and ALT levels.   -Recommended primary care physician recheck liver function at next visit.   Recheck labs in 3 months and telephone visit after that to discuss results   I discussed the assessment and treatment plan with the patient. The patient was provided an opportunity to ask questions and all were answered. The patient agreed with the plan and demonstrated an understanding of the instructions. The patient was advised to call back or seek an in-person evaluation if the symptoms worsen or if the condition fails to improve as anticipated.   I provided 20 minutes of non-face-to-face time during this encounter.  This includes time for charting and coordination of care   Naomi MARLA Chad, MD

## 2023-04-02 NOTE — Assessment & Plan Note (Signed)
 06/15/19: Ferritin 4.9, Iron  sat: 7%, Folate 2.5, 03/25/20: Ferritin 7.5, Iron  sat: 7.5%, B 12 492, Folate 5.9, Hb 12.8, MCV 90, RDW 15 09/28/2020: Hemoglobin 14.5, platelets 122, ANC 1.3, ferritin 53 (improved from 7.5), iron  saturation 27% (improved from 7.5) 04/05/2021: Ferritin 9, iron  saturation 16%, hemoglobin 15  07/05/2021: Hemoglobin 15.1, MCV 93.6, platelets 202, iron  saturation 20%, TIBC 414, ferritin 16 08/20/2022: Ferritin 7.1, TIBC 516, iron  saturation 10% 03/21/2023: Ferritin 37, iron  saturation 24%, TIBC 462, hemoglobin 16.6, AST 60, ALT 52, alk phos 253, creatinine 0.9   Status post biliopancreatic diversion with duodenal switch 2015 at [facility]   IV iron : March 2022, July 2024 No additional IV iron  necessary at this time. Slightly elevated LFTs: Uncertain if it is related to his prior bili of pancreatic diversion surgery.  He will follow-up with his primary care physician.  Recheck labs in 6 months and follow-up after that with a telephone visit

## 2023-04-03 ENCOUNTER — Other Ambulatory Visit: Payer: Self-pay | Admitting: Family Medicine

## 2023-04-03 NOTE — Telephone Encounter (Signed)
 Requesting: Ambien  10mg   Contract:  07/31/22 UDS:07/31/22 Last Visit: 02/08/23 Next Visit:  None Last Refill: 08/20/22 #90 and 1RF   Please Advise

## 2023-04-17 ENCOUNTER — Other Ambulatory Visit (INDEPENDENT_AMBULATORY_CARE_PROVIDER_SITE_OTHER): Payer: Medicare Other

## 2023-04-17 ENCOUNTER — Encounter: Payer: Self-pay | Admitting: Family Medicine

## 2023-04-17 DIAGNOSIS — E538 Deficiency of other specified B group vitamins: Secondary | ICD-10-CM

## 2023-04-17 DIAGNOSIS — I2583 Coronary atherosclerosis due to lipid rich plaque: Secondary | ICD-10-CM

## 2023-04-17 DIAGNOSIS — I2510 Atherosclerotic heart disease of native coronary artery without angina pectoris: Secondary | ICD-10-CM

## 2023-04-17 LAB — HEPATIC FUNCTION PANEL
ALT: 48 U/L (ref 0–53)
AST: 42 U/L — ABNORMAL HIGH (ref 0–37)
Albumin: 3.8 g/dL (ref 3.5–5.2)
Alkaline Phosphatase: 242 U/L — ABNORMAL HIGH (ref 39–117)
Bilirubin, Direct: 0.2 mg/dL (ref 0.0–0.3)
Total Bilirubin: 0.8 mg/dL (ref 0.2–1.2)
Total Protein: 5.7 g/dL — ABNORMAL LOW (ref 6.0–8.3)

## 2023-04-17 LAB — LIPID PANEL
Cholesterol: 102 mg/dL (ref 0–200)
HDL: 41.2 mg/dL (ref >39.00)
LDL Cholesterol: 50 mg/dL (ref 0–99)
NonHDL: 61.27
Total CHOL/HDL Ratio: 2
Triglycerides: 56 mg/dL (ref 0.0–149.0)
VLDL: 11.2 mg/dL (ref 0.0–40.0)

## 2023-04-22 ENCOUNTER — Other Ambulatory Visit: Payer: Self-pay | Admitting: Family Medicine

## 2023-04-22 DIAGNOSIS — J439 Emphysema, unspecified: Secondary | ICD-10-CM

## 2023-04-22 MED ORDER — ALBUTEROL SULFATE HFA 108 (90 BASE) MCG/ACT IN AERS: INHALATION_SPRAY | RESPIRATORY_TRACT | 5 refills | Status: AC

## 2023-04-22 NOTE — Telephone Encounter (Signed)
Copied from CRM [id number]. Topic: Clinical - Medication Refill >> Apr 22, 2023 10:27 AM Irine Seal wrote: Most Recent Primary Care Visit:  Provider: [facility]  Department: [facility]  Visit Type: LAB VISIT  Date: 04/17/2023  Medication: albuterol (VENTOLIN HFA) 108 (90 Base) MCG/ACT inhaler  Has the patient contacted their pharmacy? Yes (Agent: If no, request that the patient contact the pharmacy for the refill. If patient does not wish to contact the pharmacy document the reason why and proceed with request.) (Agent: If yes, when and what did the pharmacy advise?) pharmacy has sent in the refill request 2x within 2 weeks, and have not heard back   Is this the correct pharmacy for this prescription? No If no, delete pharmacy and type the correct one.  This is the patient's preferred pharmacy:  [facility] [id number] [place], [place] - [address] [place] [address] Phone: [phone] Fax: [phone]   Has the prescription been filled recently? No  Is the patient out of the medication? Yes  Has the patient been seen for an appointment in the last year OR does the patient have an upcoming appointment? Yes  Can we respond through MyChart? Yes  Agent: Please be advised that Rx refills may take up to 3 business days. We ask that you follow-up with your pharmacy.

## 2023-05-17 ENCOUNTER — Other Ambulatory Visit: Payer: Self-pay | Admitting: Family Medicine

## 2023-05-17 NOTE — Telephone Encounter (Signed)
 I don't see where Vitamin D has been rechecked since initial start in May 2024, please advise on refill

## 2023-07-01 ENCOUNTER — Other Ambulatory Visit: Payer: Self-pay | Admitting: Family Medicine

## 2023-07-01 DIAGNOSIS — E291 Testicular hypofunction: Secondary | ICD-10-CM | POA: Diagnosis not present

## 2023-07-03 ENCOUNTER — Other Ambulatory Visit: Payer: Self-pay | Admitting: Family Medicine

## 2023-07-03 DIAGNOSIS — N529 Male erectile dysfunction, unspecified: Secondary | ICD-10-CM

## 2023-07-05 ENCOUNTER — Other Ambulatory Visit: Payer: Self-pay | Admitting: Family Medicine

## 2023-07-08 DIAGNOSIS — Z79899 Other long term (current) drug therapy: Secondary | ICD-10-CM | POA: Diagnosis not present

## 2023-07-08 DIAGNOSIS — F411 Generalized anxiety disorder: Secondary | ICD-10-CM | POA: Diagnosis not present

## 2023-07-08 DIAGNOSIS — F330 Major depressive disorder, recurrent, mild: Secondary | ICD-10-CM | POA: Diagnosis not present

## 2023-07-08 DIAGNOSIS — G4700 Insomnia, unspecified: Secondary | ICD-10-CM | POA: Diagnosis not present

## 2023-07-15 ENCOUNTER — Other Ambulatory Visit: Payer: Self-pay | Admitting: Family Medicine

## 2023-07-15 ENCOUNTER — Encounter: Payer: Self-pay | Admitting: Family Medicine

## 2023-07-15 DIAGNOSIS — L089 Local infection of the skin and subcutaneous tissue, unspecified: Secondary | ICD-10-CM

## 2023-07-15 DIAGNOSIS — L0231 Cutaneous abscess of buttock: Secondary | ICD-10-CM

## 2023-07-15 MED ORDER — CLINDAMYCIN HCL 300 MG PO CAPS
300.0000 mg | ORAL_CAPSULE | Freq: Three times a day (TID) | ORAL | 0 refills | Status: DC
Start: 2023-07-15 — End: 2023-12-12

## 2023-07-22 ENCOUNTER — Other Ambulatory Visit: Payer: Self-pay | Admitting: Podiatry

## 2023-07-22 ENCOUNTER — Other Ambulatory Visit: Payer: Self-pay | Admitting: Family Medicine

## 2023-07-22 DIAGNOSIS — L649 Androgenic alopecia, unspecified: Secondary | ICD-10-CM

## 2023-07-31 ENCOUNTER — Other Ambulatory Visit: Payer: Self-pay | Admitting: Family Medicine

## 2023-08-20 ENCOUNTER — Other Ambulatory Visit: Payer: Self-pay | Admitting: Family Medicine

## 2023-09-26 ENCOUNTER — Other Ambulatory Visit: Payer: Self-pay | Admitting: Family Medicine

## 2023-09-30 ENCOUNTER — Encounter: Payer: Self-pay | Admitting: Family Medicine

## 2023-09-30 MED ORDER — NEBIVOLOL HCL 10 MG PO TABS: 10.0000 mg | ORAL_TABLET | Freq: Two times a day (BID) | ORAL | 0 refills | Status: DC

## 2023-10-10 ENCOUNTER — Ambulatory Visit: Admitting: Family Medicine

## 2023-10-10 ENCOUNTER — Encounter: Payer: Self-pay | Admitting: Family Medicine

## 2023-10-10 ENCOUNTER — Ambulatory Visit: Payer: Self-pay | Admitting: Family Medicine

## 2023-10-10 VITALS — BP 126/74 | HR 88 | Temp 98.0°F | Resp 16 | Ht 73.0 in | Wt 178.2 lb

## 2023-10-10 DIAGNOSIS — Z0000 Encounter for general adult medical examination without abnormal findings: Secondary | ICD-10-CM | POA: Diagnosis not present

## 2023-10-10 DIAGNOSIS — R7401 Elevation of levels of liver transaminase levels: Secondary | ICD-10-CM

## 2023-10-10 DIAGNOSIS — E519 Thiamine deficiency, unspecified: Secondary | ICD-10-CM

## 2023-10-10 DIAGNOSIS — E118 Type 2 diabetes mellitus with unspecified complications: Secondary | ICD-10-CM

## 2023-10-10 DIAGNOSIS — E60 Dietary zinc deficiency: Secondary | ICD-10-CM | POA: Diagnosis not present

## 2023-10-10 DIAGNOSIS — E559 Vitamin D deficiency, unspecified: Secondary | ICD-10-CM

## 2023-10-10 DIAGNOSIS — J439 Emphysema, unspecified: Secondary | ICD-10-CM

## 2023-10-10 DIAGNOSIS — R748 Abnormal levels of other serum enzymes: Secondary | ICD-10-CM

## 2023-10-10 DIAGNOSIS — R809 Proteinuria, unspecified: Secondary | ICD-10-CM

## 2023-10-10 LAB — CBC
HCT: 47.7 % (ref 39.0–52.0)
Hemoglobin: 16 g/dL (ref 13.0–17.0)
MCHC: 33.5 g/dL (ref 30.0–36.0)
MCV: 94.8 fl (ref 78.0–100.0)
Platelets: 183 K/uL (ref 150.0–400.0)
RBC: 5.03 Mil/uL (ref 4.22–5.81)
RDW: 14.4 % (ref 11.5–15.5)
WBC: 5.6 K/uL (ref 4.0–10.5)

## 2023-10-10 LAB — COMPREHENSIVE METABOLIC PANEL WITH GFR
ALT: 65 U/L — ABNORMAL HIGH (ref 0–53)
AST: 53 U/L — ABNORMAL HIGH (ref 0–37)
Albumin: 4.2 g/dL (ref 3.5–5.2)
Alkaline Phosphatase: 238 U/L — ABNORMAL HIGH (ref 39–117)
BUN: 16 mg/dL (ref 6–23)
CO2: 26 meq/L (ref 19–32)
Calcium: 8.6 mg/dL (ref 8.4–10.5)
Chloride: 108 meq/L (ref 96–112)
Creatinine, Ser: 0.89 mg/dL (ref 0.40–1.50)
GFR: 88.7 mL/min (ref >60.00)
Glucose, Bld: 101 mg/dL — ABNORMAL HIGH (ref 70–99)
Potassium: 4.6 meq/L (ref 3.5–5.1)
Sodium: 139 meq/L (ref 135–145)
Total Bilirubin: 1 mg/dL (ref 0.2–1.2)
Total Protein: 6.3 g/dL (ref 6.0–8.3)

## 2023-10-10 LAB — LIPID PANEL
Cholesterol: 95 mg/dL (ref 0–200)
HDL: 42.1 mg/dL (ref >39.00)
LDL Cholesterol: 44 mg/dL (ref 0–99)
NonHDL: 52.51
Total CHOL/HDL Ratio: 2
Triglycerides: 45 mg/dL (ref 0.0–149.0)
VLDL: 9 mg/dL (ref 0.0–40.0)

## 2023-10-10 LAB — MICROALBUMIN / CREATININE URINE RATIO
Creatinine,U: 165.2 mg/dL
Microalb Creat Ratio: 51.9 mg/g — ABNORMAL HIGH (ref 0.0–30.0)
Microalb, Ur: 8.6 mg/dL — ABNORMAL HIGH (ref 0.0–1.9)

## 2023-10-10 LAB — HEMOGLOBIN A1C: Hgb A1c MFr Bld: 5.5 % (ref 4.6–6.5)

## 2023-10-10 LAB — VITAMIN D 25 HYDROXY (VIT D DEFICIENCY, FRACTURES): VITD: 7.98 ng/mL — ABNORMAL LOW (ref 30.00–100.00)

## 2023-10-10 MED ORDER — NEBIVOLOL HCL 10 MG PO TABS: 10.0000 mg | ORAL_TABLET | Freq: Two times a day (BID) | ORAL | 3 refills | Status: AC

## 2023-10-10 MED ORDER — DIAZEPAM 5 MG PO TABS: 5.0000 mg | ORAL_TABLET | Freq: Two times a day (BID) | ORAL | 2 refills | Status: AC | PRN

## 2023-10-10 MED ORDER — UMECLIDINIUM BROMIDE 62.5 MCG/ACT IN AEPB: 1.0000 | INHALATION_SPRAY | Freq: Every day | RESPIRATORY_TRACT | 5 refills | Status: DC

## 2023-10-10 NOTE — Progress Notes (Signed)
 Chief Complaint  Patient presents with   Annual Exam    CPE    Well Male Daniel [name] is here for a complete physical.   His last physical was >1 year ago.  Current diet: in general, a healthy diet.   Current exercise: none Weight trend: stable Fatigue out of ordinary? No. Seat belt? No   Advanced directive? No  Health maintenance Shingrix- Yes Colonoscopy- Yes Tetanus- Yes Hep C- Yes Pneumonia vaccine- Yes AAA screening- Yes  Patient has had a cough over the past couple months.  He has a known history of emphysema based off of CT scans.  Most recent lung cancer CT was in November 2024.  Again it showed emphysema but nothing else sinister was noted.  He has no wheezing or shortness of breath.  He denies any association with meals/positions.  No runny/stuffy nose.  Past Medical History:  Diagnosis Date   Anal fistula    recurrent   Benign localized prostatic hyperplasia with lower urinary tract symptoms (LUTS)    Bilateral lower extremity edema    feet   COPD with emphysema (HCC)    Diverticulosis of colon    ED (erectile dysfunction)    Generalized anxiety disorder    Hemorrhoids    History of colon polyps    History of diabetes mellitus, type II    10-10-2017  per pt no issues after wt loss surgery    History of pneumothorax    1983 & 1988  right spontaneous, treated w/ chest tube   Hyperlipidemia    Hypertension    MDD (major depressive disorder)    OSA (obstructive sleep apnea)    10-10-2017  not used cpap since 2016 after wt loss surgery 2015   Status post biliopancreatic diversion with duodenal switch    07-30-2013   @ [facility] for wt. loss surgery   Vitamin D  deficiency    Wears glasses    Zinc  deficiency 04/27/2016     Past Surgical History:  Procedure Laterality Date   ANAL FISTULOTOMY N/A 08/07/2016   Procedure: ANAL FISTULOTOMY;  Surgeon: Vernetta Berg, MD;  Location: [facility] OR;  Service: General;  Laterality: N/A;   CHEST TUBE INSERTION  1988    right lung for spontaneous pneumothorax   COLONOSCOPY W/ BIOPSIES  08/02/2016   EXOSTECTECTOMY TOE Left 04/25/2017   Procedure: TARSAL EXOSTECTOMY OF MEDIAL CUNEIFORM AND EXOSTECTOMY OF THE FIRST METATARSAL;  Surgeon: Janit Thresa CHRISTELLA, DPM;  Location: [facility] OR;  Service: Podiatry;  Laterality: Left;   GASTROPLASTY DUODENAL SWITCH  07-30-2013    @[facility]    wt loss surgery not gastric bypass surgery (biliopancreatic diversion w/ duodenal switch)   HEMORRHOID SURGERY N/A 08/07/2016   Procedure: HEMORRHOIDECTOMY;  Surgeon: Vernetta Berg, MD;  Location: [facility] OR;  Service: General;  Laterality: N/A;   INCISION AND DRAINAGE ABSCESS N/A 10/14/2017   Procedure: POSSIBLE INCISION AND DRAINAGE OF ANAL ABSCESS;  Surgeon: Teresa Lonni CHRISTELLA, MD;  Location: [facility];  Service: General;  Laterality: N/A;   INGUINAL HERNIA REPAIR Left 04/10/2017   Procedure: LAPAROSCOPIC LEFT INGUINAL HERNIA REPAIR WITH MESH;  Surgeon: Vernetta Berg, MD;  Location: [facility];  Service: General;  Laterality: Left;   INSERTION OF MESH Left 04/10/2017   Procedure: INSERTION OF MESH;  Surgeon: Vernetta Berg, MD;  Location: [facility];  Service: General;  Laterality: Left;   IRRIGATION AND DEBRIDEMENT SEBACEOUS CYST     back   MASS EXCISION N/A 01/29/2018  Procedure: EXCISION of lesion on scalp;  Surgeon: Lowery Estefana RAMAN, DO;  Location: [facility];  Service: Plastics;  Laterality: N/A;   RECTAL EXAM UNDER ANESTHESIA N/A 10/14/2017   Procedure: ANAL EXAM UNDER ANESTHESIA, INJECTION OF FIBRIN GLUE;  Surgeon: Teresa Lonni HERO, MD;  Location: [facility];  Service: General;  Laterality: N/A;    Medications  Current Outpatient Medications on File Prior to Visit  Medication Sig Dispense Refill   albuterol  (VENTOLIN  HFA) 108 (90 Base) MCG/ACT inhaler TAKE 2 PUFFS BY MOUTH 10-15 MINUTES PRIOR TO EXERCISE 18 g 5   amphetamine -dextroamphetamine   (ADDERALL) 20 MG tablet Take 1 tablet (20 mg total) by mouth 2 (two) times daily. 60 tablet 0   buPROPion  (WELLBUTRIN  XL) 150 MG 24 hr tablet TAKE 1 TABLET BY MOUTH 3 TIMES A DAY 270 tablet 0   chlorpheniramine-HYDROcodone  (TUSSIONEX) 10-8 MG/5ML Take 5 mLs by mouth every 12 (twelve) hours as needed for cough. 115 mL 0   Cholecalciferol (VITAMIN D3) 1000 units CAPS Take 1,000 Units by mouth daily.     clobetasol  (TEMOVATE ) 0.05 % external solution APPLY TO AFFECTED AREA(S) ON SCALP DAILY AS NEEDED FOR ITCHING AS DIRECTED 50 mL 1   cyclobenzaprine  (FLEXERIL ) 10 MG tablet Take 1 tablet (10 mg total) by mouth 2 (two) times daily as needed for muscle spasms. 20 tablet 0   diazepam  (VALIUM ) 5 MG tablet Take 1 tablet (5 mg total) by mouth every 12 (twelve) hours as needed for anxiety. 90 tablet 2   doxycycline  (VIBRA -TABS) 100 MG tablet Take 1 tablet (100 mg total) by mouth 2 (two) times daily. 28 tablet 0   DULoxetine  (CYMBALTA ) 30 MG capsule TAKE 3 CAPSULES BY MOUTH EVERY DAY 270 capsule 2   finasteride  (PROPECIA ) 1 MG tablet TAKE 1 TABLET BY MOUTH DAILY 30 tablet 1   fluticasone  (FLONASE ) 50 MCG/ACT nasal spray Place 2 sprays into both nostrils daily. 16 g 6   folic acid  (FOLVITE ) 400 MCG tablet      furosemide  (LASIX ) 20 MG tablet TAKE ONE TABLET BY MOUTH DAILY AS NEEDED FOR SWELLING 30 tablet 1   lisinopril  (ZESTRIL ) 2.5 MG tablet TAKE 1 TABLET BY MOUTH DAILY 90 tablet 0   meloxicam  (MOBIC ) 15 MG tablet TAKE 1 TABLET BY MOUTH DAILY 60 tablet 1   nebivolol  (BYSTOLIC ) 10 MG tablet Take 1 tablet (10 mg total) by mouth 2 (two) times daily. 60 tablet 0   pantoprazole  (PROTONIX ) 40 MG tablet TAKE 1 TABLET BY MOUTH DAILY 90 tablet 1   rOPINIRole  (REQUIP ) 1 MG tablet TAKE 2 TABLETS BY MOUTH NIGHTLY 180 tablet 3   rosuvastatin  (CRESTOR ) 20 MG tablet Take 1 tablet (20 mg total) by mouth daily. 90 tablet 3   Salicylic Acid  40 % PADS Apply 1 Application topically daily as needed. 48 each 1   sildenafil   (VIAGRA ) 100 MG tablet TAKE 1 TABLET BY MOUTH DAILY AS NEEDED FOR ERECTILE DYSFUNCTION 30 tablet 7   tamsulosin  (FLOMAX ) 0.4 MG CAPS capsule Take 1 capsule (0.4 mg total) by mouth daily after supper. 30 capsule 0   Testosterone  Enanthate (XYOSTED) 100 MG/0.5ML SOAJ 4 pre-filled syringes (Prefferd Testsoterone Enanthate, if not available then Cypionate is acceptable) for weekly [place] injection.     Thiamine  HCl (VITAMIN B-1) 250 MG tablet Take 500 mg by mouth daily.     VITAMIN A  PO Take 2,400 Units by mouth daily.     vitamin C (ASCORBIC ACID) 250 MG tablet Take 500  mg by mouth daily.     Vitamin D , Ergocalciferol , (DRISDOL ) 1.25 MG (50000 UNIT) CAPS capsule TAKE 1 CAPSULE BY MOUTH EVERY 7 DAYS 12 capsule 1   vitamin E  200 UNIT capsule Take 200 Units by mouth daily.     vitamin k 100 MCG tablet      Zinc  50 MG CAPS Take 1 capsule by mouth daily.     zinc  gluconate 50 MG tablet Take 100 mg by mouth daily.     zolpidem  (AMBIEN ) 10 MG tablet TAKE 1 TABLET BY MOUTH EVERY NIGHT AT BEDTIME AS NEEDED FOR SLEEP 90 tablet 1    Allergies No Known Allergies  Family History Family History  Problem Relation Age of Onset   Cancer Mother        ovarian   Hyperlipidemia Father    Hypertension Father    Heart disease Father        MI 7/19   Emphysema Father    Pulmonary disease Father    Heart disease Maternal Grandmother    Heart disease Maternal Grandfather    Stroke Maternal Grandfather    Heart disease Paternal Grandmother    Angina Paternal Grandmother    Heart disease Paternal Grandfather    Stroke Paternal Grandfather    Diabetes Maternal Uncle    Kidney disease Maternal Uncle    Diabetes Paternal Uncle    Cancer Paternal Uncle    Neuropathy Neg Hx     Review of Systems: Constitutional:  no fevers Eye:  no recent significant change in vision Ears:  No changes in hearing Nose/Mouth/Throat:  no complaints of nasal congestion, no sore throat Cardiovascular: no chest pain Respiratory:   No shortness of breath Gastrointestinal:  No change in bowel habits GU:  No frequency Integumentary:  no abnormal skin lesions reported Neurologic:  no headaches Endocrine:  denies unexplained weight changes  Exam BP 126/74 (BP Location: Left Arm, Patient Position: Sitting)   Pulse 88   Temp 98 F (36.7 C) (Oral)   Resp 16   Ht 6' 1 (1.854 m)   Wt 178 lb 3.2 oz (80.8 kg)   SpO2 98%   BMI 23.51 kg/m  General:  well developed, well nourished, in no apparent distress Skin:  no significant moles, warts, or growths Head:  no masses, lesions, or tenderness Eyes:  pupils equal and round, sclera anicteric without injection Ears:  canals without lesions, TMs shiny without retraction, no obvious effusion, no erythema Nose:  nares patent, mucosa normal Throat/Pharynx:  lips and gingiva without lesion; tongue and uvula midline; non-inflamed pharynx; no exudates or postnasal drainage Lungs:  clear to auscultation, breath sounds equal bilaterally, no respiratory distress Cardio:  regular rate and rhythm, no LE edema or bruits Rectal: Deferred GI: BS+, S, NT, ND, no masses or organomegaly Musculoskeletal:  symmetrical muscle groups noted without atrophy or deformity Neuro:  gait normal; deep tendon reflexes normal and symmetric Psych: well oriented with normal range of affect and appropriate judgment/insight  Assessment and Plan  Well adult exam  Type 2 diabetes mellitus with complication, without long-term current use of insulin (HCC) - Plan: CBC, Comprehensive metabolic panel with GFR, Hemoglobin A1c, Lipid panel, Microalbumin / creatinine urine ratio  Zinc  deficiency - Plan: Zinc   Thiamin deficiency - Plan: Vitamin B1  Vitamin D  deficiency - Plan: VITAMIN D  25 Hydroxy (Vit-D Deficiency, Fractures)  Pulmonary emphysema, unspecified emphysema type (HCC) - Plan: umeclidinium bromide  (INCRUSE ELLIPTA ) 62.5 MCG/ACT AEPB, DG Chest 2 View   Well  69 y.o. male. Counseled on diet and  exercise. Other orders as above. Emphysema: Chronic, not controlled.  Continue albuterol  as needed.  Check a chest x-ray.  Add Incruse 1 puff daily.  He will let me know how things are going over the next month. Follow up in 6 mo.  The patient voiced understanding and agreement to the plan.  Mabel [place], DO 10/10/23 12:19 PM

## 2023-10-10 NOTE — Patient Instructions (Addendum)
Give [place] 2-3 business days to get the results of your labs back.  ? ?Keep the diet clean and stay active. ? ?Please get me a copy of your advanced directive form at your convenience.  ? ?Wear your seatbelt.  ? ?Let us know if you need anything. ?

## 2023-10-11 ENCOUNTER — Ambulatory Visit (HOSPITAL_BASED_OUTPATIENT_CLINIC_OR_DEPARTMENT_OTHER)
Admission: RE | Admit: 2023-10-11 | Discharge: 2023-10-11 | Disposition: A | Discharge status: Home / Self Care | Source: Ambulatory Visit | Attending: Family Medicine | Admitting: Family Medicine

## 2023-10-11 DIAGNOSIS — J439 Emphysema, unspecified: Secondary | ICD-10-CM | POA: Insufficient documentation

## 2023-10-11 DIAGNOSIS — R059 Cough, unspecified: Secondary | ICD-10-CM | POA: Diagnosis not present

## 2023-10-15 LAB — ZINC: Zinc: 53 ug/dL — ABNORMAL LOW (ref 60–130)

## 2023-10-15 LAB — VITAMIN B1: Vitamin B1 (Thiamine): 27 nmol/L (ref 8–30)

## 2023-10-17 ENCOUNTER — Other Ambulatory Visit: Payer: Self-pay | Admitting: Family Medicine

## 2023-10-17 MED ORDER — VITAMIN D (ERGOCALCIFEROL) 1.25 MG (50000 UNIT) PO CAPS: 50000.0000 [IU] | ORAL_CAPSULE | ORAL | 1 refills | Status: DC

## 2023-10-17 NOTE — Addendum Note (Signed)
 Addended by: Caden Fukushima L on: 10/17/2023 10:33 AM   Modules accepted: Orders

## 2023-10-17 NOTE — Telephone Encounter (Signed)
 Pended, sign if this is the correct dosage.

## 2023-10-17 NOTE — Addendum Note (Signed)
 Addended by: DORLENE CHIQUITA RAMAN on: 10/17/2023 10:37 AM   Modules accepted: Orders

## 2023-10-24 DIAGNOSIS — K080 Exfoliation of teeth due to systemic causes: Secondary | ICD-10-CM | POA: Diagnosis not present

## 2023-10-28 ENCOUNTER — Other Ambulatory Visit: Payer: Self-pay | Admitting: Family Medicine

## 2023-10-28 DIAGNOSIS — K219 Gastro-esophageal reflux disease without esophagitis: Secondary | ICD-10-CM

## 2023-10-29 DIAGNOSIS — S62622A Displaced fracture of medial phalanx of right middle finger, initial encounter for closed fracture: Secondary | ICD-10-CM | POA: Diagnosis not present

## 2023-10-29 DIAGNOSIS — S62622P Displaced fracture of medial phalanx of right middle finger, subsequent encounter for fracture with malunion: Secondary | ICD-10-CM | POA: Diagnosis not present

## 2023-10-31 ENCOUNTER — Ambulatory Visit (HOSPITAL_BASED_OUTPATIENT_CLINIC_OR_DEPARTMENT_OTHER)
Admission: RE | Admit: 2023-10-31 | Discharge: 2023-10-31 | Disposition: A | Discharge status: Home / Self Care | Source: Ambulatory Visit | Attending: Family Medicine | Admitting: Family Medicine

## 2023-10-31 DIAGNOSIS — K838 Other specified diseases of biliary tract: Secondary | ICD-10-CM | POA: Diagnosis not present

## 2023-10-31 DIAGNOSIS — R7401 Elevation of levels of liver transaminase levels: Secondary | ICD-10-CM | POA: Diagnosis not present

## 2023-10-31 DIAGNOSIS — R7989 Other specified abnormal findings of blood chemistry: Secondary | ICD-10-CM | POA: Diagnosis not present

## 2023-11-06 IMAGING — MR MR ANKLE*L* W/O CM
5 series · 35 of 40 positions shown · non-contrast
Comparison: Left ankle radiographs 07/05/2021

CLINICAL DATA: Ankle pain. Tendon abnormality suspected. Lateral
left ankle pain and swelling. Stumbled on steps 2 weeks ago.

EXAM:
MRI OF THE LEFT ANKLE WITHOUT CONTRAST
TECHNIQUE: Multiplanar, multisequence MR imaging of the ankle was performed. No
intravenous contrast was administered.

[Series 4: T2 fat-sat · axial · 3.0mm · 0.50mm/px · z∈[-129,+11]mm · 8 of 37 slices shown (1 of 2)]
[im 1/37]
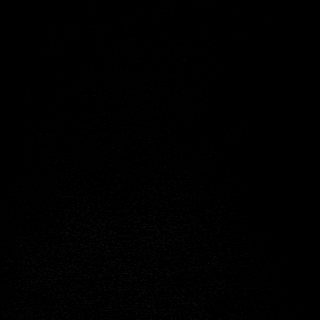
[im 5/37]
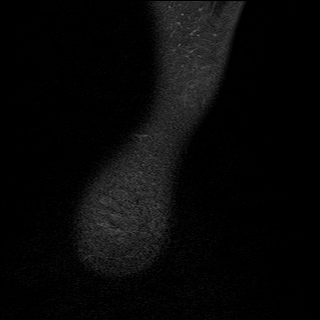
[im 13/37]
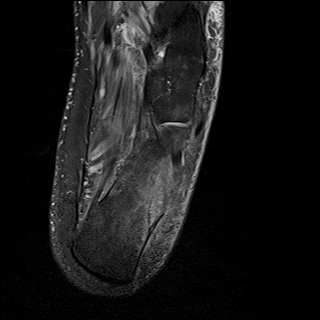
[im 17/37]
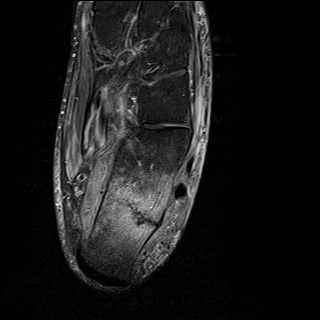
[im 21/37]
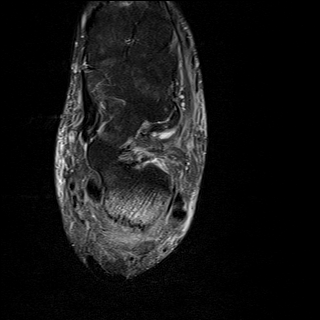
[im 25/37]
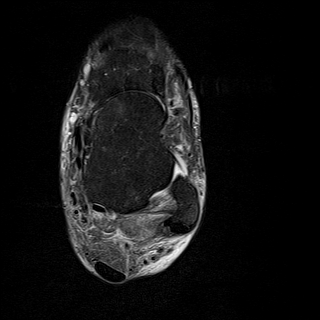
[im 33/37]
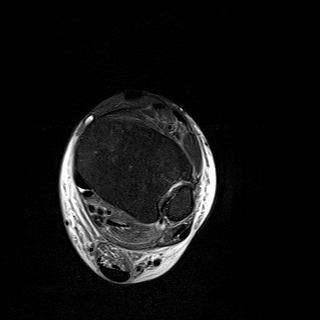
[im 37/37]
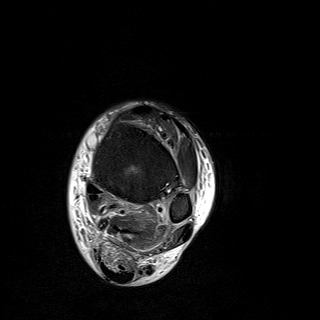

[Series 5: PD fat-sat · axial · 3.0mm · 0.42mm/px · z∈[-129,+11]mm · 9 of 37 slices shown]
[im 1/37]
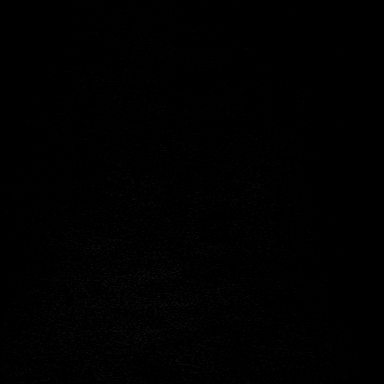
[im 5/37]
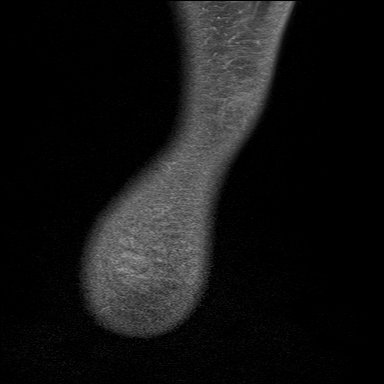
[im 9/37]
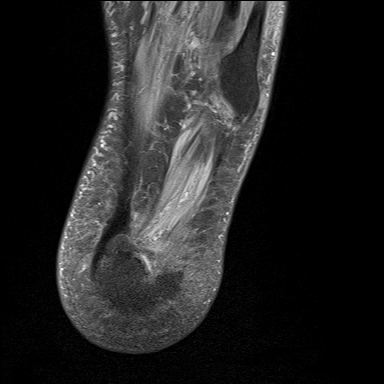
[im 13/37]
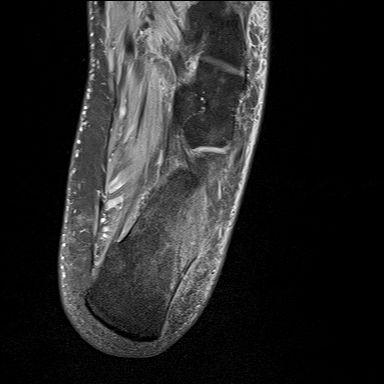
[im 17/37]
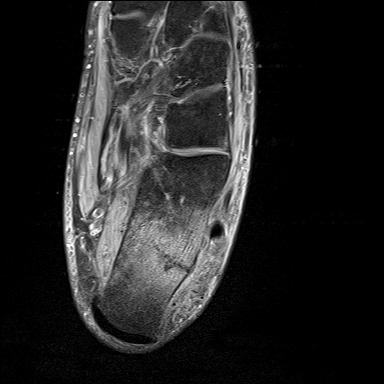
[im 21/37]
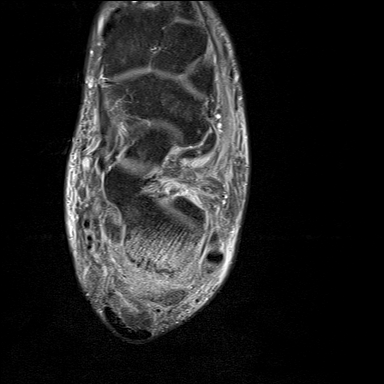
[im 25/37]
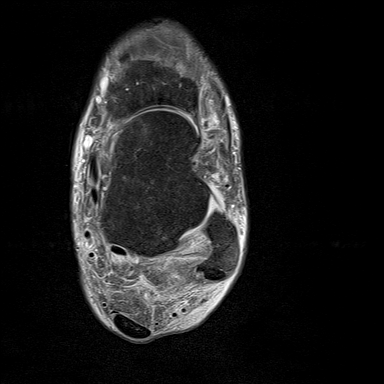
[im 33/37]
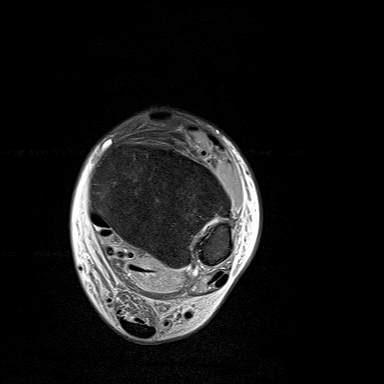
[im 37/37]
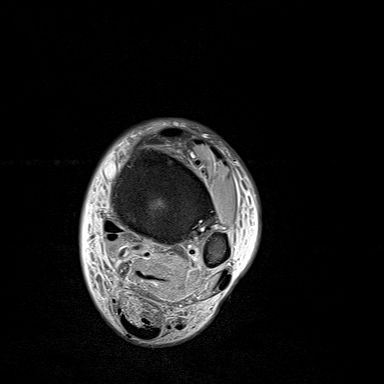

[Series 6: T1 · sagittal · 4.0mm · 0.56mm/px · 5 of 20 slices shown]
[im 1/20]
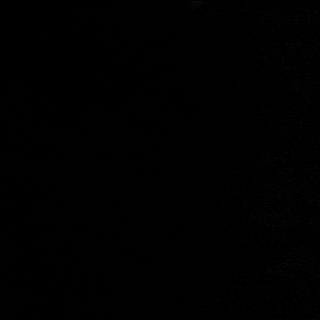
[im 5/20]
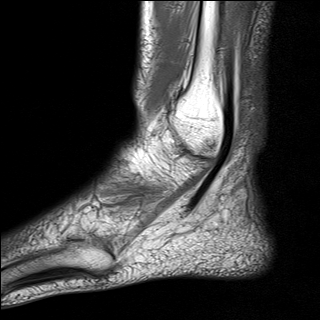
[im 10/20]
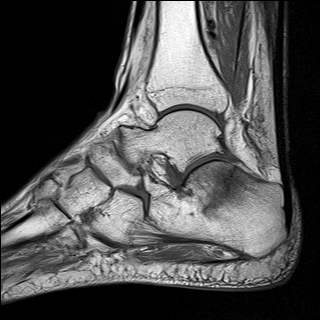
[im 15/20]
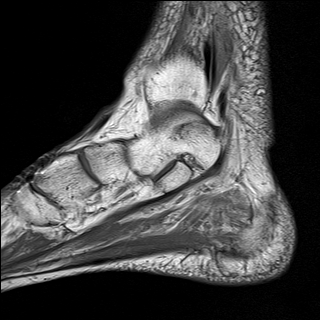
[im 20/20]
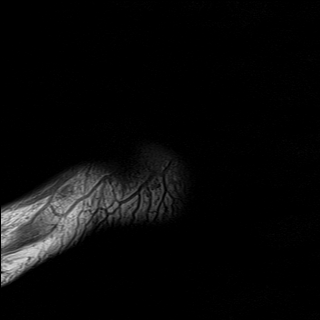

[Series 7: STIR · sagittal · 4.0mm · 0.35mm/px · 5 of 20 slices shown]
[im 1/20]
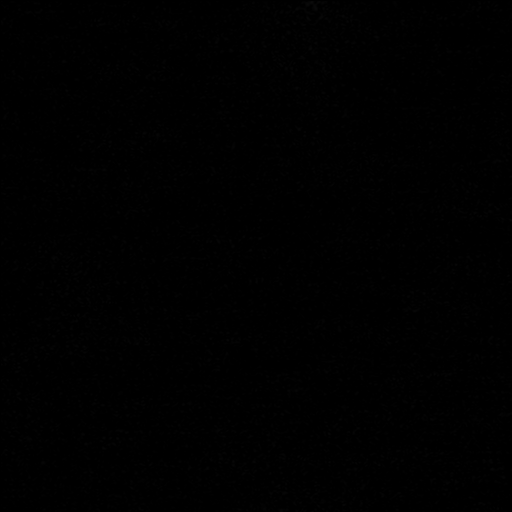
[im 5/20]
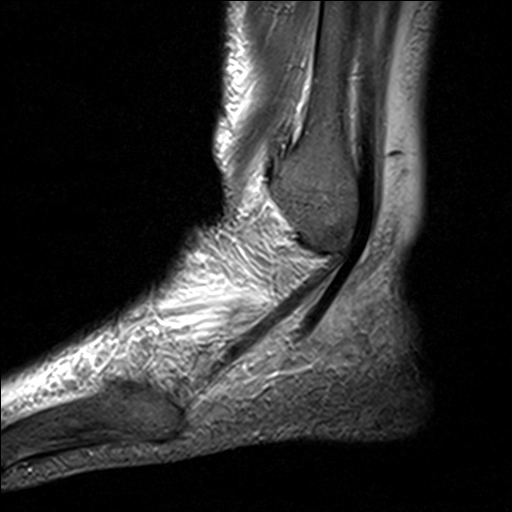
[im 10/20]
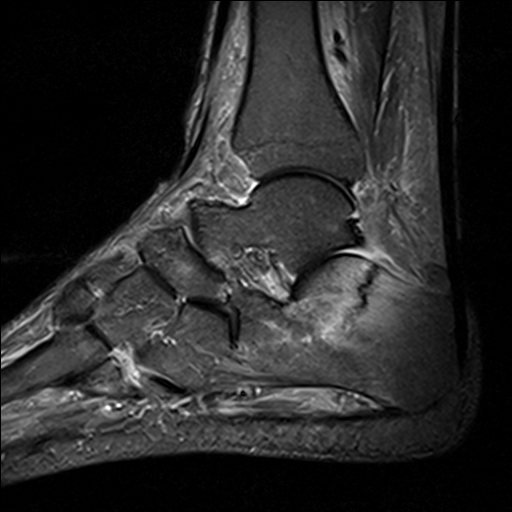
[im 15/20]
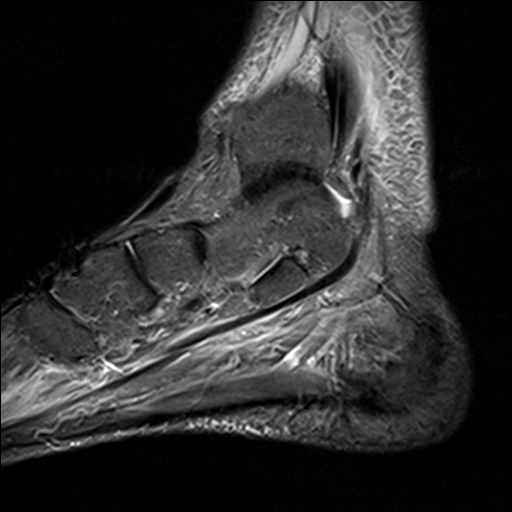
[im 20/20]
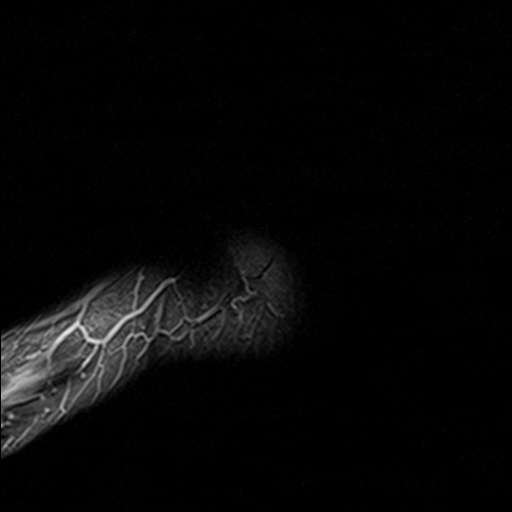

[Series 8: T2 fat-sat · coronal · 3.0mm · 0.50mm/px · 8 of 37 slices shown (2 of 2)]
[im 1/37]
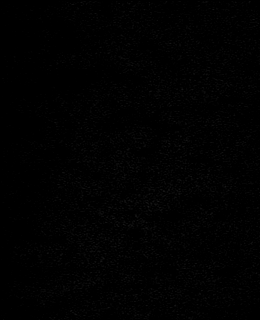
[im 5/37]
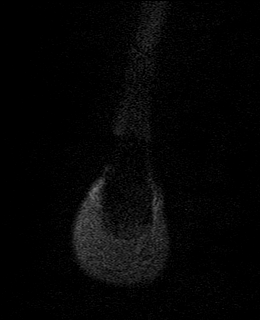
[im 13/37]
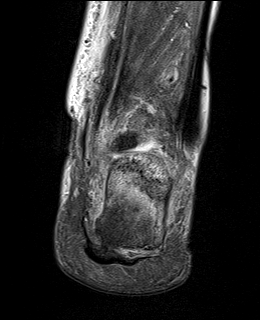
[im 17/37]
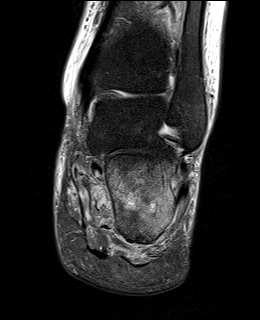
[im 21/37]
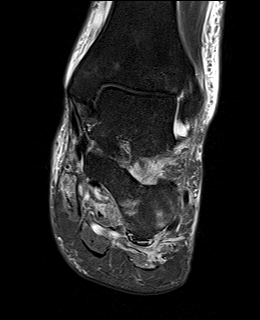
[im 25/37]
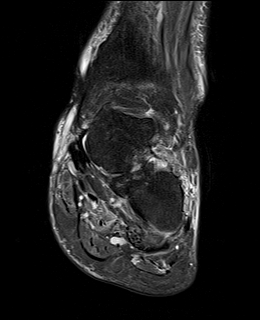
[im 33/37]
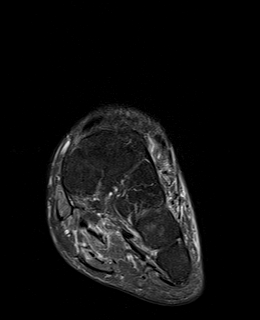
[im 37/37]
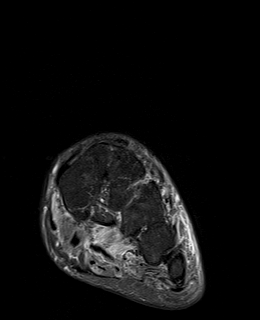

[35 of 40 positions shown; findings below may reference images not displayed]

FINDINGS: TENDONS

Peroneal: The peroneus longus and brevis tendons are intact.

Posteromedial: Intact tibialis posterior, flexor digitorum longus,
and flexor hallucis longus tendons.

Anterior: The tibialis anterior, extensor hallucis longus, and
extensor digitorum longus tendons are intact.

Achilles: Intact.

Plantar Fascia: Intact. Mild-to-moderate plantar calcaneal heel
spur.

LIGAMENTS

Lateral: The anterior and posterior talofibular, anterior and
posterior tibiofibular, and calcaneofibular ligaments are intact.

Medial: The tibiotalar deep deltoid and tibial spring ligaments are
intact.

CARTILAGE

Ankle Joint: Intact cartilage.

Subtalar Joints/Sinus Tarsi: Fat is preserved within sinus tarsi.

Bones: There is curvilinear decreased T1 and decreased T2 signal
indicating an acute to subacute fracture within the superior aspect
of the calcaneal body predominantly extending through the cortex
just posterior of the posterior subtalar joint and extending
inferiorly and anteriorly and contacting the lateral calcaneal
cortex. High-grade surrounding calcaneal marrow edema.

There is a prominent anterior superior aspect of the anterior
process of the calcaneus (anterior sign) which articulates with the
adjacent navicular. Mild diffuse subchondral degenerative cystic
change and mild peripheral degenerative osteophytosis at this
articulation. This is consistent with likely cartilaginous
talocalcaneal coalition.

Mild edema and cystic change within the posterior plantar lateral
aspect of the cuboid adjacent to the peroneus longus tendon course.

Other: The tarsal tunnel is unremarkable.
IMPRESSION: :
IMPRESSION: 1. Acute to subacute nondisplaced fracture of the superior aspect of
the calcaneal body, contacting the superior articular surface
posterior of the posterior subtalar joint and also extending to the
lateral calcaneal surface with high-grade surrounding marrow edema.
2. Calcaneonavicular coalition, likely cartilaginous.

## 2023-11-08 ENCOUNTER — Ambulatory Visit: Payer: Self-pay | Admitting: Family Medicine

## 2023-11-08 DIAGNOSIS — K838 Other specified diseases of biliary tract: Secondary | ICD-10-CM

## 2023-11-17 ENCOUNTER — Ambulatory Visit (HOSPITAL_BASED_OUTPATIENT_CLINIC_OR_DEPARTMENT_OTHER)
Admission: RE | Admit: 2023-11-17 | Discharge: 2023-11-17 | Disposition: A | Discharge status: Home / Self Care | Source: Ambulatory Visit | Attending: Family Medicine | Admitting: Family Medicine

## 2023-11-17 DIAGNOSIS — K838 Other specified diseases of biliary tract: Secondary | ICD-10-CM | POA: Insufficient documentation

## 2023-11-19 DIAGNOSIS — K838 Other specified diseases of biliary tract: Secondary | ICD-10-CM | POA: Diagnosis not present

## 2023-11-19 DIAGNOSIS — I700 Atherosclerosis of aorta: Secondary | ICD-10-CM | POA: Diagnosis not present

## 2023-11-19 MED ORDER — GADOBUTROL 1 MMOL/ML IV SOLN
6.8000 mL | Freq: Once | INTRAVENOUS | Status: AC | PRN
  Administered 2023-11-19: 6.8 mL via INTRAVENOUS

## 2023-11-20 ENCOUNTER — Ambulatory Visit: Payer: Self-pay | Admitting: Family Medicine

## 2023-11-25 ENCOUNTER — Other Ambulatory Visit: Payer: Self-pay | Admitting: Family Medicine

## 2023-11-25 DIAGNOSIS — L649 Androgenic alopecia, unspecified: Secondary | ICD-10-CM

## 2023-12-04 ENCOUNTER — Other Ambulatory Visit: Payer: Self-pay | Admitting: Family Medicine

## 2023-12-04 DIAGNOSIS — L089 Local infection of the skin and subcutaneous tissue, unspecified: Secondary | ICD-10-CM

## 2023-12-12 ENCOUNTER — Other Ambulatory Visit: Payer: Self-pay | Admitting: Family Medicine

## 2023-12-12 ENCOUNTER — Encounter: Payer: Self-pay | Admitting: Family Medicine

## 2023-12-12 DIAGNOSIS — L089 Local infection of the skin and subcutaneous tissue, unspecified: Secondary | ICD-10-CM

## 2023-12-12 MED ORDER — CLINDAMYCIN HCL 300 MG PO CAPS: 300.0000 mg | ORAL_CAPSULE | Freq: Three times a day (TID) | ORAL | 0 refills | Status: AC

## 2023-12-20 DIAGNOSIS — E291 Testicular hypofunction: Secondary | ICD-10-CM | POA: Diagnosis not present

## 2023-12-31 DIAGNOSIS — F411 Generalized anxiety disorder: Secondary | ICD-10-CM | POA: Diagnosis not present

## 2023-12-31 DIAGNOSIS — F330 Major depressive disorder, recurrent, mild: Secondary | ICD-10-CM | POA: Diagnosis not present

## 2023-12-31 DIAGNOSIS — Z79899 Other long term (current) drug therapy: Secondary | ICD-10-CM | POA: Diagnosis not present

## 2023-12-31 DIAGNOSIS — G4700 Insomnia, unspecified: Secondary | ICD-10-CM | POA: Diagnosis not present

## 2024-01-06 ENCOUNTER — Encounter: Payer: Self-pay | Admitting: Family Medicine

## 2024-01-06 ENCOUNTER — Other Ambulatory Visit: Payer: Self-pay | Admitting: Acute Care

## 2024-01-06 DIAGNOSIS — Z122 Encounter for screening for malignant neoplasm of respiratory organs: Secondary | ICD-10-CM

## 2024-01-06 DIAGNOSIS — F17210 Nicotine dependence, cigarettes, uncomplicated: Secondary | ICD-10-CM

## 2024-01-06 DIAGNOSIS — Z87891 Personal history of nicotine dependence: Secondary | ICD-10-CM

## 2024-01-06 DIAGNOSIS — Z129 Encounter for screening for malignant neoplasm, site unspecified: Secondary | ICD-10-CM

## 2024-01-09 ENCOUNTER — Other Ambulatory Visit

## 2024-01-22 ENCOUNTER — Encounter: Payer: Self-pay | Admitting: Family Medicine

## 2024-01-22 ENCOUNTER — Ambulatory Visit (INDEPENDENT_AMBULATORY_CARE_PROVIDER_SITE_OTHER): Admitting: Family Medicine

## 2024-01-22 VITALS — BP 124/72 | HR 75 | Temp 98.0°F | Resp 16 | Ht 73.0 in | Wt 178.0 lb

## 2024-01-22 DIAGNOSIS — L089 Local infection of the skin and subcutaneous tissue, unspecified: Secondary | ICD-10-CM | POA: Diagnosis not present

## 2024-01-22 DIAGNOSIS — Z23 Encounter for immunization: Secondary | ICD-10-CM

## 2024-01-22 DIAGNOSIS — R809 Proteinuria, unspecified: Secondary | ICD-10-CM

## 2024-01-22 DIAGNOSIS — R748 Abnormal levels of other serum enzymes: Secondary | ICD-10-CM

## 2024-01-22 DIAGNOSIS — E559 Vitamin D deficiency, unspecified: Secondary | ICD-10-CM

## 2024-01-22 LAB — HEPATIC FUNCTION PANEL
ALT: 41 U/L (ref 0–53)
AST: 40 U/L — ABNORMAL HIGH (ref 0–37)
Albumin: 3.8 g/dL (ref 3.5–5.2)
Alkaline Phosphatase: 204 U/L — ABNORMAL HIGH (ref 39–117)
Bilirubin, Direct: 0.2 mg/dL (ref 0.0–0.3)
Total Bilirubin: 0.7 mg/dL (ref 0.2–1.2)
Total Protein: 5.9 g/dL — ABNORMAL LOW (ref 6.0–8.3)

## 2024-01-22 LAB — MICROALBUMIN / CREATININE URINE RATIO
Creatinine,U: 164 mg/dL
Microalb Creat Ratio: 32.8 mg/g — ABNORMAL HIGH (ref 0.0–30.0)
Microalb, Ur: 5.4 mg/dL — ABNORMAL HIGH (ref 0.0–1.9)

## 2024-01-22 MED ORDER — AMOXICILLIN 875 MG PO TABS: 875.0000 mg | ORAL_TABLET | Freq: Two times a day (BID) | ORAL | 2 refills | Status: DC

## 2024-01-22 NOTE — Patient Instructions (Signed)
Let us know if you need anything.

## 2024-01-22 NOTE — Progress Notes (Signed)
 Chief Complaint  Patient presents with   Cyst    Cyst    Daniel [name] is a 68 y.o. male here for a skin complaint.  Duration: 10 days Gets recurrent abscesses, has abx on hand when it happens. Sometimes it needs to be drained. He saw gen surg in the past and is not currently a surg candidate.  Location: buttock Pruritic? No Painful? Yes Drainage? No New soaps/lotions/topicals/detergents? No Trauma? No Other associated symptoms: no fevers or spreading redness Therapies tried thus far: clindamycin , amox  Past Medical History:  Diagnosis Date   Anal fistula    recurrent   Benign localized prostatic hyperplasia with lower urinary tract symptoms (LUTS)    Bilateral lower extremity edema    feet   COPD with emphysema (HCC)    Diverticulosis of colon    ED (erectile dysfunction)    Generalized anxiety disorder    Hemorrhoids    History of colon polyps    History of diabetes mellitus, type II    10-10-2017  per pt no issues after wt loss surgery    History of pneumothorax    1983 & 1988  right spontaneous, treated w/ chest tube   Hyperlipidemia    Hypertension    MDD (major depressive disorder)    OSA (obstructive sleep apnea)    10-10-2017  not used cpap since 2016 after wt loss surgery 2015   Status post biliopancreatic diversion with duodenal switch    07-30-2013   @ [facility] for wt. loss surgery   Vitamin D  deficiency    Wears glasses    Zinc  deficiency 04/27/2016    BP 124/72 (BP Location: Left Arm, Patient Position: Sitting)   Pulse 75   Temp 98 F (36.7 C) (Oral)   Resp 16   Ht 6' 1 (1.854 m)   Wt 178 lb (80.7 kg)   SpO2 98%   BMI 23.48 kg/m  Gen: awake, alert, appearing stated age Heart: RRR Lungs: CTAB. No accessory muscle use Skin: deferred today.  Psych: Age appropriate judgment and insight  Recurrent infection of skin  Vitamin D  deficiency - Plan: Vitamin D  (25 hydroxy)  Abnormal liver enzymes - Plan: Hepatic function panel  Proteinuria,  unspecified type - Plan: Microalbumin / creatinine urine ratio  Exacerbation of chronic issue. Does better w amoxicillin 875 mg bid. Will send in and give refills. He will let us  know if we do not improve.  Reck.  Reck. Reck.  F/u prn. The patient voiced understanding and agreement to the plan.  Mabel [place], DO 01/22/24 11:59 AM

## 2024-01-23 ENCOUNTER — Encounter: Payer: Self-pay | Admitting: Family Medicine

## 2024-01-23 LAB — VITAMIN D 25 HYDROXY (VIT D DEFICIENCY, FRACTURES): VITD: 14.68 ng/mL — ABNORMAL LOW (ref 30.00–100.00)

## 2024-01-24 ENCOUNTER — Other Ambulatory Visit: Payer: Self-pay

## 2024-01-24 DIAGNOSIS — R809 Proteinuria, unspecified: Secondary | ICD-10-CM

## 2024-01-30 ENCOUNTER — Other Ambulatory Visit

## 2024-02-04 ENCOUNTER — Other Ambulatory Visit: Payer: Self-pay

## 2024-02-04 DIAGNOSIS — Z23 Encounter for immunization: Secondary | ICD-10-CM

## 2024-02-04 NOTE — Addendum Note (Signed)
 Addended by: Dianne Bady M on: 02/04/2024 11:19 AM   Modules accepted: Orders

## 2024-02-05 ENCOUNTER — Ambulatory Visit (HOSPITAL_COMMUNITY)
Admission: RE | Admit: 2024-02-05 | Discharge: 2024-02-05 | Disposition: A | Discharge status: Home / Self Care | Source: Ambulatory Visit | Attending: *Deleted | Admitting: *Deleted

## 2024-02-05 DIAGNOSIS — Z87891 Personal history of nicotine dependence: Secondary | ICD-10-CM

## 2024-02-05 DIAGNOSIS — Z122 Encounter for screening for malignant neoplasm of respiratory organs: Secondary | ICD-10-CM | POA: Insufficient documentation

## 2024-02-05 DIAGNOSIS — F17210 Nicotine dependence, cigarettes, uncomplicated: Secondary | ICD-10-CM | POA: Diagnosis not present

## 2024-02-11 ENCOUNTER — Ambulatory Visit (INDEPENDENT_AMBULATORY_CARE_PROVIDER_SITE_OTHER): Admitting: Family Medicine

## 2024-02-11 ENCOUNTER — Encounter: Payer: Self-pay | Admitting: Family Medicine

## 2024-02-11 VITALS — BP 128/78 | HR 67 | Temp 98.0°F | Resp 16 | Ht 73.0 in | Wt 181.6 lb

## 2024-02-11 DIAGNOSIS — J984 Other disorders of lung: Secondary | ICD-10-CM

## 2024-02-11 MED ORDER — PREDNISONE 20 MG PO TABS: 40.0000 mg | ORAL_TABLET | Freq: Every day | ORAL | 0 refills | Status: AC

## 2024-02-11 NOTE — Progress Notes (Signed)
 Chief Complaint  Patient presents with   Follow-up    Follow Up    Daniel [name] is 68 y.o. and is here for a cough.  Duration: 3 weeks Productive? Usually dry Associated symptoms: none; he was exposed to exhaust for a de-humidifier just prior to this starting. Denies: fever, night sweats, nasal congestion, rhinorrhea, sore throat, facial pain, chest pain, hemoptysis, dyspnea, wheezing, weight loss He has been taking Delsym at home. Hx of GERD? No ACEi? No  Past Medical History:  Diagnosis Date   Anal fistula    recurrent   Benign localized prostatic hyperplasia with lower urinary tract symptoms (LUTS)    Bilateral lower extremity edema    feet   COPD with emphysema (HCC)    Diverticulosis of colon    ED (erectile dysfunction)    Generalized anxiety disorder    Hemorrhoids    History of colon polyps    History of diabetes mellitus, type II    10-10-2017  per pt no issues after wt loss surgery    History of pneumothorax    1983 & 1988  right spontaneous, treated w/ chest tube   Hyperlipidemia    Hypertension    MDD (major depressive disorder)    OSA (obstructive sleep apnea)    10-10-2017  not used cpap since 2016 after wt loss surgery 2015   Status post biliopancreatic diversion with duodenal switch    07-30-2013   @ [facility] for wt. loss surgery   Vitamin D  deficiency    Wears glasses    Zinc  deficiency 04/27/2016   Family History  Problem Relation Age of Onset   Cancer Mother        ovarian   Hyperlipidemia Father    Hypertension Father    Heart disease Father        MI 7/19   Emphysema Father    Pulmonary disease Father    Heart disease Maternal Grandmother    Heart disease Maternal Grandfather    Stroke Maternal Grandfather    Heart disease Paternal Grandmother    Angina Paternal Grandmother    Heart disease Paternal Grandfather    Stroke Paternal Grandfather    Diabetes Maternal Uncle    Kidney disease Maternal Uncle    Diabetes Paternal Uncle    Cancer  Paternal Uncle    Neuropathy Neg Hx    Allergies as of 02/11/2024   No Known Allergies      Medication List        Accurate as of February 11, 2024 12:52 PM. If you have any questions, ask your nurse or doctor.          albuterol  108 (90 Base) MCG/ACT inhaler Commonly known as: VENTOLIN  HFA TAKE 2 PUFFS BY MOUTH 10-15 MINUTES PRIOR TO EXERCISE   amoxicillin  875 MG tablet Commonly known as: AMOXIL  Take 1 tablet (875 mg total) by mouth 2 (two) times daily.   amphetamine -dextroamphetamine  20 MG tablet Commonly known as: Adderall Take 1 tablet (20 mg total) by mouth 2 (two) times daily.   buPROPion  150 MG 24 hr tablet Commonly known as: WELLBUTRIN  XL TAKE 1 TABLET BY MOUTH 3 TIMES A DAY   clobetasol  0.05 % external solution Commonly known as: TEMOVATE  APPLY TO AFFECTED AREA(S) ON SCALP DAILY AS NEEDED FOR ITCHING AS DIRECTED   diazepam  5 MG tablet Commonly known as: VALIUM  Take 1 tablet (5 mg total) by mouth every 12 (twelve) hours as needed for anxiety.   DULoxetine  30 MG capsule  Commonly known as: CYMBALTA  TAKE 3 CAPSULES BY MOUTH EVERY DAY   finasteride  1 MG tablet Commonly known as: PROPECIA  Take 1 tablet (1 mg total) by mouth daily.   folic acid  400 MCG tablet Commonly known as: FOLVITE    lisinopril  2.5 MG tablet Commonly known as: ZESTRIL  TAKE 1 TABLET BY MOUTH DAILY   meloxicam  15 MG tablet Commonly known as: MOBIC  TAKE 1 TABLET BY MOUTH DAILY   nebivolol  10 MG tablet Commonly known as: BYSTOLIC  Take 1 tablet (10 mg total) by mouth 2 (two) times daily.   pantoprazole  40 MG tablet Commonly known as: PROTONIX  TAKE 1 TABLET BY MOUTH DAILY   predniSONE  20 MG tablet Commonly known as: DELTASONE  Take 2 tablets (40 mg total) by mouth daily with breakfast for 5 days. Started by: Mabel Deward Pry   rOPINIRole  1 MG tablet Commonly known as: REQUIP  TAKE 2 TABLETS BY MOUTH NIGHTLY   rosuvastatin  20 MG tablet Commonly known as: Crestor  Take  1 tablet (20 mg total) by mouth daily.   Salicylic Acid  40 % Pads Apply 1 Application topically daily as needed.   sildenafil  100 MG tablet Commonly known as: VIAGRA  TAKE 1 TABLET BY MOUTH DAILY AS NEEDED FOR ERECTILE DYSFUNCTION   tamsulosin  0.4 MG Caps capsule Commonly known as: FLOMAX  Take 1 capsule (0.4 mg total) by mouth daily after supper.   umeclidinium bromide  62.5 MCG/ACT Aepb Commonly known as: INCRUSE ELLIPTA  Inhale 1 puff into the lungs daily.   VITAMIN A  PO Take 2,400 Units by mouth daily.   vitamin B-1 250 MG tablet Take 500 mg by mouth daily.   vitamin C 250 MG tablet Commonly known as: ASCORBIC ACID Take 500 mg by mouth daily.   Vitamin D  (Ergocalciferol ) 1.25 MG (50000 UNIT) Caps capsule Commonly known as: DRISDOL  Take 1 capsule (50,000 Units total) by mouth 2 (two) times a week.   Vitamin D3 25 MCG (1000 UT) Caps Take 1,000 Units by mouth daily.   vitamin E  200 UNIT capsule Take 200 Units by mouth daily.   vitamin k 100 MCG tablet   Xyosted 100 MG/0.5ML Soaj Generic drug: Testosterone  Enanthate 4 pre-filled syringes (Prefferd Testsoterone Enanthate, if not available then Cypionate is acceptable) for weekly [place] injection.   zinc  gluconate 50 MG tablet Take 100 mg by mouth daily.   zolpidem  10 MG tablet Commonly known as: AMBIEN  TAKE 1 TABLET BY MOUTH EVERY NIGHT AT BEDTIME AS NEEDED FOR SLEEP        BP 128/78 (BP Location: Left Arm, Patient Position: Sitting)   Pulse 67   Temp 98 F (36.7 C) (Oral)   Resp 16   Ht 6' 1 (1.854 m)   Wt 181 lb 9.6 oz (82.4 kg)   SpO2 98%   BMI 23.96 kg/m  Gen: Awake, alert, appears stated age HEENT: Ears neg, nares patent without D/C, turbinates unremarkable, Pharynx pink without exudate Neck: Supple, no masses or asymmetry, no tenderness Heart: RRR, no LE edema Lungs: CTAB, normal effort, no accessory muscle use Psych: Age appropriate judgement and insight, normal mood and  affect  Pneumonitis  Suspect lingering pneumonitis/inflammation.  5-day prednisone  burst 40 mg daily.  He had a CT chest for lung cancer screening done on 11/19 so we will reach out to the radiology team to see if they can read this.  If normal, we will proceed with our plan.  If abnormal, we will proceed accordingly. F/u in 4 weeks if symptoms fail to improve. The patient voiced understanding  and agreement to the plan.  Mabel Mt [place], DO 02/11/24 12:52 PM

## 2024-02-11 NOTE — Patient Instructions (Signed)
 We will be in touch regarding your imaging.   Let us  know if you need anything.

## 2024-02-12 ENCOUNTER — Telehealth: Payer: Self-pay

## 2024-02-12 ENCOUNTER — Other Ambulatory Visit: Payer: Self-pay | Admitting: Acute Care

## 2024-02-12 DIAGNOSIS — Z122 Encounter for screening for malignant neoplasm of respiratory organs: Secondary | ICD-10-CM

## 2024-02-12 DIAGNOSIS — F17210 Nicotine dependence, cigarettes, uncomplicated: Secondary | ICD-10-CM

## 2024-02-12 DIAGNOSIS — Z87891 Personal history of nicotine dependence: Secondary | ICD-10-CM

## 2024-02-12 NOTE — Telephone Encounter (Signed)
 Called spoke with Alexandro heinz from reading room and he stated he would get someone to read the CT Chest imaging.

## 2024-02-12 NOTE — Telephone Encounter (Signed)
 Called pt was advised, stated understand.

## 2024-02-25 ENCOUNTER — Encounter: Payer: Self-pay | Admitting: Family Medicine

## 2024-02-25 ENCOUNTER — Other Ambulatory Visit: Payer: Self-pay | Admitting: Family Medicine

## 2024-02-26 ENCOUNTER — Other Ambulatory Visit: Payer: Self-pay

## 2024-02-26 DIAGNOSIS — F419 Anxiety disorder, unspecified: Secondary | ICD-10-CM

## 2024-02-26 MED ORDER — BUPROPION HCL ER (XL) 150 MG PO TB24: 150.0000 mg | ORAL_TABLET | Freq: Three times a day (TID) | ORAL | 0 refills | Status: AC

## 2024-02-26 MED ORDER — DULOXETINE HCL 30 MG PO CPEP: ORAL_CAPSULE | ORAL | 2 refills | Status: AC

## 2024-03-14 ENCOUNTER — Other Ambulatory Visit: Payer: Self-pay | Admitting: Family Medicine

## 2024-03-17 ENCOUNTER — Encounter: Payer: Self-pay | Admitting: Family Medicine

## 2024-03-17 ENCOUNTER — Ambulatory Visit (INDEPENDENT_AMBULATORY_CARE_PROVIDER_SITE_OTHER): Admitting: Family Medicine

## 2024-03-17 VITALS — BP 130/76 | HR 77 | Temp 98.0°F | Resp 16 | Wt 178.0 lb

## 2024-03-17 DIAGNOSIS — J439 Emphysema, unspecified: Secondary | ICD-10-CM | POA: Diagnosis not present

## 2024-03-17 MED ORDER — TRELEGY ELLIPTA 100-62.5-25 MCG/ACT IN AEPB: 1.0000 | INHALATION_SPRAY | Freq: Every day | RESPIRATORY_TRACT | 11 refills | Status: AC

## 2024-03-17 NOTE — Patient Instructions (Addendum)
 If you do not hear anything about your referral in the next 1-2 weeks, call our office and ask for an update.  Rinse mouth out after use.  We are done with the Incruse and replacing it.

## 2024-03-17 NOTE — Progress Notes (Signed)
 Chief Complaint  Patient presents with   Cough    Cough     Daniel [name] is 68 y.o. and is here for a cough.  Duration: 2 months Productive? Yes Associated symptoms: Sore throat from coughing Started after he was exposed to ozone. Denies: fever, night sweats, nasal congestion, rhinorrhea, facial pain, chest pain, hemoptysis, dyspnea, wheezing He is compliant with Incruse daily.  Albuterol  not helpful. Hx of GERD?  Yes, no association with meals or his GERD symptoms ACEi? Yes  Past Medical History:  Diagnosis Date   Anal fistula    recurrent   Benign localized prostatic hyperplasia with lower urinary tract symptoms (LUTS)    Bilateral lower extremity edema    feet   COPD with emphysema (HCC)    Diverticulosis of colon    ED (erectile dysfunction)    Generalized anxiety disorder    Hemorrhoids    History of colon polyps    History of diabetes mellitus, type II    10-10-2017  per pt no issues after wt loss surgery    History of pneumothorax    1983 & 1988  right spontaneous, treated w/ chest tube   Hyperlipidemia    Hypertension    MDD (major depressive disorder)    OSA (obstructive sleep apnea)    10-10-2017  not used cpap since 2016 after wt loss surgery 2015   Status post biliopancreatic diversion with duodenal switch    07-30-2013   @ [facility] for wt. loss surgery   Vitamin D  deficiency    Wears glasses    Zinc  deficiency 04/27/2016   Family History  Problem Relation Age of Onset   Cancer Mother        ovarian   Hyperlipidemia Father    Hypertension Father    Heart disease Father        MI 7/19   Emphysema Father    Pulmonary disease Father    Heart disease Maternal Grandmother    Heart disease Maternal Grandfather    Stroke Maternal Grandfather    Heart disease Paternal Grandmother    Angina Paternal Grandmother    Heart disease Paternal Grandfather    Stroke Paternal Grandfather    Diabetes Maternal Uncle    Kidney disease Maternal Uncle    Diabetes  Paternal Uncle    Cancer Paternal Uncle    Neuropathy Neg Hx    Allergies as of 03/17/2024   No Known Allergies      Medication List        Accurate as of March 17, 2024  2:22 PM. If you have any questions, ask your nurse or doctor.          STOP taking these medications    umeclidinium bromide  62.5 MCG/ACT Aepb Commonly known as: INCRUSE ELLIPTA  Stopped by: Mabel Pry, DO       TAKE these medications    rOPINIRole  1 MG tablet Commonly known as: REQUIP  TAKE 2 TABLETS BY MOUTH NIGHTLY The timing of this medication is very important.   albuterol  108 (90 Base) MCG/ACT inhaler Commonly known as: VENTOLIN  HFA TAKE 2 PUFFS BY MOUTH 10-15 MINUTES PRIOR TO EXERCISE   amoxicillin  875 MG tablet Commonly known as: AMOXIL  Take 1 tablet (875 mg total) by mouth 2 (two) times daily.   amphetamine -dextroamphetamine  20 MG tablet Commonly known as: Adderall Take 1 tablet (20 mg total) by mouth 2 (two) times daily.   buPROPion  150 MG 24 hr tablet Commonly known as: WELLBUTRIN  XL Take 1  tablet (150 mg total) by mouth 3 (three) times daily.   clobetasol  0.05 % external solution Commonly known as: TEMOVATE  APPLY TO AFFECTED AREA(S) ON SCALP DAILY AS NEEDED FOR ITCHING AS DIRECTED   diazepam  5 MG tablet Commonly known as: VALIUM  Take 1 tablet (5 mg total) by mouth every 12 (twelve) hours as needed for anxiety.   DULoxetine  30 MG capsule Commonly known as: CYMBALTA  TAKE 3 CAPSULES BY MOUTH EVERY DAY   finasteride  1 MG tablet Commonly known as: PROPECIA  Take 1 tablet (1 mg total) by mouth daily.   folic acid  400 MCG tablet Commonly known as: FOLVITE    lisinopril  2.5 MG tablet Commonly known as: ZESTRIL  TAKE 1 TABLET BY MOUTH DAILY   meloxicam  15 MG tablet Commonly known as: MOBIC  TAKE 1 TABLET BY MOUTH DAILY   nebivolol  10 MG tablet Commonly known as: BYSTOLIC  Take 1 tablet (10 mg total) by mouth 2 (two) times daily.   pantoprazole  40 MG  tablet Commonly known as: PROTONIX  TAKE 1 TABLET BY MOUTH DAILY   rosuvastatin  20 MG tablet Commonly known as: Crestor  Take 1 tablet (20 mg total) by mouth daily.   Salicylic Acid  40 % Pads Apply 1 Application topically daily as needed.   sildenafil  100 MG tablet Commonly known as: VIAGRA  TAKE 1 TABLET BY MOUTH DAILY AS NEEDED FOR ERECTILE DYSFUNCTION   tamsulosin  0.4 MG Caps capsule Commonly known as: FLOMAX  Take 1 capsule (0.4 mg total) by mouth daily after supper.   Trelegy Ellipta 100-62.5-25 MCG/ACT Aepb Generic drug: Fluticasone -Umeclidin-Vilant Inhale 1 puff into the lungs daily. Rinse mouth out after use. Started by: Mabel Pry, DO   VITAMIN A  PO Take 2,400 Units by mouth daily.   vitamin B-1 250 MG tablet Take 500 mg by mouth daily.   vitamin C 250 MG tablet Commonly known as: ASCORBIC ACID Take 500 mg by mouth daily.   Vitamin D  (Ergocalciferol ) 1.25 MG (50000 UNIT) Caps capsule Commonly known as: DRISDOL  TAKE 1 CAPSULE BY MOUTH 2 TIMES A WEEK   Vitamin D3 25 MCG (1000 UT) Caps Take 1,000 Units by mouth daily.   vitamin E  200 UNIT capsule Take 200 Units by mouth daily.   vitamin k 100 MCG tablet   Xyosted 100 MG/0.5ML Soaj Generic drug: Testosterone  Enanthate 4 pre-filled syringes (Prefferd Testsoterone Enanthate, if not available then Cypionate is acceptable) for weekly [place] injection.   zinc  gluconate 50 MG tablet Take 100 mg by mouth daily.   zolpidem  10 MG tablet Commonly known as: AMBIEN  TAKE 1 TABLET BY MOUTH EVERY NIGHT AT BEDTIME AS NEEDED FOR SLEEP        BP 130/76 (BP Location: Left Arm, Patient Position: Sitting)   Pulse 77   Temp 98 F (36.7 C) (Oral)   Resp 16   Wt 178 lb (80.7 kg)   SpO2 96%   BMI 23.48 kg/m  Gen: Awake, alert, appears stated age HEENT: Ears neg, nares patent without D/C, turbinates unremarkable, Pharynx pink without exudate Neck: Supple, no masses or asymmetry, no tenderness Heart: RRR, no LE  edema Lungs: CTAB, normal effort, no accessory muscle use Psych: Age appropriate judgement and insight, normal mood and affect  Pulmonary emphysema (HCC) - Plan: Fluticasone -Umeclidin-Vilant (TRELEGY ELLIPTA) 100-62.5-25 MCG/ACT AEPB, Ambulatory referral to Pulmonology  Chronic, not controlled.  Change Incruse to Trelegy.  Rinse mouth out after use.  Continue albuterol  as needed.  Refer to pulmonology for their opinion. F/u with me as originally scheduled The patient voiced understanding and agreement  to the plan.  Mabel Mt [place], DO 03/17/2024 2:22 PM

## 2024-03-24 ENCOUNTER — Ambulatory Visit: Admitting: Internal Medicine

## 2024-03-24 ENCOUNTER — Encounter: Payer: Self-pay | Admitting: Internal Medicine

## 2024-03-24 VITALS — BP 132/78 | HR 77 | Ht 73.0 in | Wt 177.0 lb

## 2024-03-24 DIAGNOSIS — R058 Other specified cough: Secondary | ICD-10-CM

## 2024-03-24 DIAGNOSIS — F17210 Nicotine dependence, cigarettes, uncomplicated: Secondary | ICD-10-CM | POA: Diagnosis not present

## 2024-03-24 DIAGNOSIS — J439 Emphysema, unspecified: Secondary | ICD-10-CM | POA: Diagnosis not present

## 2024-03-24 DIAGNOSIS — I10 Essential (primary) hypertension: Secondary | ICD-10-CM

## 2024-03-24 LAB — CBC WITH DIFFERENTIAL/PLATELET
Basophils Absolute: 0 K/uL (ref 0.0–0.1)
Basophils Relative: 0.6 % (ref 0.0–3.0)
Eosinophils Absolute: 0.2 K/uL (ref 0.0–0.7)
Eosinophils Relative: 3.8 % (ref 0.0–5.0)
HCT: 44.6 % (ref 39.0–52.0)
Hemoglobin: 15.1 g/dL (ref 13.0–17.0)
Lymphocytes Relative: 20.5 % (ref 12.0–46.0)
Lymphs Abs: 1.3 K/uL (ref 0.7–4.0)
MCHC: 33.8 g/dL (ref 30.0–36.0)
MCV: 90.9 fl (ref 78.0–100.0)
Monocytes Absolute: 0.4 K/uL (ref 0.1–1.0)
Monocytes Relative: 6.7 % (ref 3.0–12.0)
Neutro Abs: 4.2 K/uL (ref 1.4–7.7)
Neutrophils Relative %: 68.4 % (ref 43.0–77.0)
Platelets: 189 K/uL (ref 150.0–400.0)
RBC: 4.91 Mil/uL (ref 4.22–5.81)
RDW: 13.9 % (ref 11.5–15.5)
WBC: 6.1 K/uL (ref 4.0–10.5)

## 2024-03-24 MED ORDER — VALSARTAN 80 MG PO TABS: 80.0000 mg | ORAL_TABLET | Freq: Every day | ORAL | 11 refills | Status: AC

## 2024-03-24 NOTE — Patient Instructions (Addendum)
 We will walk you today at your place plus faster toward end  Use your albuterol  as a rescue medication to be used if you can't catch your breath by resting, slowing your pace,  or doing a relaxed purse lip breathing pattern.  - The less you use it, the better it will work when you need it. - Ok to use up to 2 puffs  every 4 hours if you must but call for  appointment if use goes up over your usual need - Don't leave home without it !!  (think of it like a spare tire or starter fluid for your car)   Also  Ok to try albuterol  15 min before an activity (on alternating days)  that you know would usually make you short of breath and see if it makes any difference and if makes none then don't take albuterol  after activity unless you can't catch your breath as this means it's the resting that helps, not the albuterol .   Stop lisinopril  and start valsartan   80 mg one daily -  if too strong then break in half   The key is to stop smoking completely before smoking completely stops you!    Please schedule a follow up office visit in 4 weeks  with  PFTs on return

## 2024-03-24 NOTE — Progress Notes (Unsigned)
 "  Daniel [name], male    DOB: 09/14/1955   MRN: [id number]   Brief patient profile:  45  yowm active  smoker  referred to pulmonary clinic 03/24/2024 by  Dr Frann  for cough / sob p exp to ozone generator       Seen by Clance 2024 for OSA AND IN LCS PROGRAM SINCE 2021  LDSCT  02/05/2024   RADS 2  Centrilobular and paraseptal emphysema   History of Present Illness  03/24/2024  Pulmonary/ 1st office eval/Tinia Oravec on ? Trelegy  Chief Complaint  Patient presents with   Consult    Pt inhaled toxic ozone gas ago- causing pt violent coughing fits w/ clear phlegm, tightness of the chest.    Dyspnea:  slow walk ok - did have problems with riding horse in past and thinks albuterol   Cough: clear mucus  s/p prednisone  helped along  Sleep: needed to sleep upright p exposure now flow bed one pillow or almost flat in recliner  SABA use: not using  02 [id number]  LDSCT:***  No obvious day to day or daytime pattern/variability or assoc excess/ purulent sputum or mucus plugs or hemoptysis or cp or chest tightness, subjective wheeze or overt sinus or hb symptoms.    Also denies any obvious fluctuation of symptoms with weather or environmental changes or other aggravating or alleviating factors except as outlined above   No unusual exposure hx or h/o childhood pna/ asthma or knowledge of premature birth.  Current Allergies, Complete Past Medical History, Past Surgical History, Family History, and Social History were reviewed in [employer] record.  ROS  The following are not active complaints unless bolded Hoarseness, sore throat, dysphagia, dental problems, itching, sneezing,  nasal congestion or discharge of excess mucus or purulent secretions, ear ache,   fever, chills, sweats, unintended wt loss or wt gain, classically pleuritic or exertional cp,  orthopnea pnd or arm/hand swelling  or leg swelling, presyncope, palpitations, abdominal pain, anorexia, nausea, vomiting, diarrhea   or change in bowel habits or change in bladder habits, change in stools or change in urine, dysuria, hematuria,  rash, arthralgias, visual complaints, headache, numbness, weakness or ataxia or problems with walking or coordination,  change in mood or  memory.             Outpatient Medications Prior to Visit  Medication Sig Dispense Refill   albuterol  (VENTOLIN  HFA) 108 (90 Base) MCG/ACT inhaler TAKE 2 PUFFS BY MOUTH 10-15 MINUTES PRIOR TO EXERCISE 18 g 5   amoxicillin  (AMOXIL ) 875 MG tablet Take 1 tablet (875 mg total) by mouth 2 (two) times daily. 20 tablet 2   amphetamine -dextroamphetamine  (ADDERALL) 20 MG tablet Take 1 tablet (20 mg total) by mouth 2 (two) times daily. 60 tablet 0   buPROPion  (WELLBUTRIN  XL) 150 MG 24 hr tablet Take 1 tablet (150 mg total) by mouth 3 (three) times daily. 270 tablet 0   Cholecalciferol (VITAMIN D3) 1000 units CAPS Take 1,000 Units by mouth daily.     clobetasol  (TEMOVATE ) 0.05 % external solution APPLY TO AFFECTED AREA(S) ON SCALP DAILY AS NEEDED FOR ITCHING AS DIRECTED 50 mL 1   diazepam  (VALIUM ) 5 MG tablet Take 1 tablet (5 mg total) by mouth every 12 (twelve) hours as needed for anxiety. 90 tablet 2   DULoxetine  (CYMBALTA ) 30 MG capsule TAKE 3 CAPSULES BY MOUTH EVERY DAY 270 capsule 2   finasteride  (PROPECIA ) 1 MG tablet Take 1 tablet (1 mg total) by  mouth daily. 90 tablet 0   Fluticasone -Umeclidin-Vilant (TRELEGY ELLIPTA ) 100-62.5-25 MCG/ACT AEPB Inhale 1 puff into the lungs daily. Rinse mouth out after use. 1 each 11   folic acid  (FOLVITE ) 400 MCG tablet      lisinopril  (ZESTRIL ) 2.5 MG tablet TAKE 1 TABLET BY MOUTH DAILY 90 tablet 0   meloxicam  (MOBIC ) 15 MG tablet TAKE 1 TABLET BY MOUTH DAILY 60 tablet 1   nebivolol  (BYSTOLIC ) 10 MG tablet Take 1 tablet (10 mg total) by mouth 2 (two) times daily. 180 tablet 3   pantoprazole  (PROTONIX ) 40 MG tablet TAKE 1 TABLET BY MOUTH DAILY 90 tablet 1   rOPINIRole  (REQUIP ) 1 MG tablet TAKE 2 TABLETS BY MOUTH  NIGHTLY 180 tablet 3   rosuvastatin  (CRESTOR ) 20 MG tablet Take 1 tablet (20 mg total) by mouth daily. 90 tablet 3   Salicylic Acid  40 % PADS Apply 1 Application topically daily as needed. 48 each 1   sildenafil  (VIAGRA ) 100 MG tablet TAKE 1 TABLET BY MOUTH DAILY AS NEEDED FOR ERECTILE DYSFUNCTION 30 tablet 7   tamsulosin  (FLOMAX ) 0.4 MG CAPS capsule Take 1 capsule (0.4 mg total) by mouth daily after supper. 30 capsule 0   Testosterone  Enanthate (XYOSTED) 100 MG/0.5ML SOAJ 4 pre-filled syringes (Prefferd Testsoterone Enanthate, if not available then Cypionate is acceptable) for weekly [place] injection.     Thiamine  HCl (VITAMIN B-1) 250 MG tablet Take 500 mg by mouth daily.     VITAMIN A  PO Take 2,400 Units by mouth daily.     vitamin C (ASCORBIC ACID) 250 MG tablet Take 500 mg by mouth daily.     Vitamin D , Ergocalciferol , (DRISDOL ) 1.25 MG (50000 UNIT) CAPS capsule TAKE 1 CAPSULE BY MOUTH 2 TIMES A WEEK 24 capsule 1   vitamin E  200 UNIT capsule Take 200 Units by mouth daily.     vitamin k 100 MCG tablet      zinc  gluconate 50 MG tablet Take 100 mg by mouth daily.     zolpidem  (AMBIEN ) 10 MG tablet TAKE 1 TABLET BY MOUTH EVERY NIGHT AT BEDTIME AS NEEDED FOR SLEEP 90 tablet 1   No facility-administered medications prior to visit.    Past Medical History:  Diagnosis Date   Anal fistula    recurrent   Benign localized prostatic hyperplasia with lower urinary tract symptoms (LUTS)    Bilateral lower extremity edema    feet   COPD with emphysema (HCC)    Diverticulosis of colon    ED (erectile dysfunction)    Generalized anxiety disorder    Hemorrhoids    History of colon polyps    History of diabetes mellitus, type II    10-10-2017  per pt no issues after wt loss surgery    History of pneumothorax    1983 & 1988  right spontaneous, treated w/ chest tube   Hyperlipidemia    Hypertension    MDD (major depressive disorder)    OSA (obstructive sleep apnea)    10-10-2017  not used cpap  since 2016 after wt loss surgery 2015   Status post biliopancreatic diversion with duodenal switch    07-30-2013   @ [facility] for wt. loss surgery   Vitamin D  deficiency    Wears glasses    Zinc  deficiency 04/27/2016      Objective:     BP 132/78   Pulse 77   Ht 6' 1 (1.854 m)   Wt 177 lb (80.3 kg)   SpO2 98% Comment: room  air  BMI 23.35 kg/m   SpO2: 98 % (room air)      Assessment        "

## 2024-03-25 NOTE — Assessment & Plan Note (Addendum)
 Active smoker LDSCT  02/05/2024   Centrilobular and paraseptal emphysema  - [facility] One AT phenotype 03/24/2024  sent  - - The proper method of use, as well as anticipated side effects, of a metered-dose inhaler were discussed and demonstrated to the patient using teach back method.  - 03/24/2024   Walked on RA  x  3  lap(s) =  approx 750  ft  @ mod pace, stopped due to end of study  with lowest 02 sats 96%   Clinically he has very mild copd and not having aecopd  typical of a Group A pt for whom saba prn is approp   Re SABA :  I spent extra time with pt today reviewing appropriate use of albuterol  for prn use on exertion with the following points: 1) saba is for relief of sob that does not improve by walking a slower pace or resting but rather if the pt does not improve after trying this first. 2) If the pt is convinced, as many are, that saba helps recover from activity faster then it's easy to tell if this is the case by re-challenging : ie stop, take the inhaler, then p 5 minutes try the exact same activity (intensity of workload) that just caused the symptoms and see if they are substantially diminished or not after saba 3) if there is an activity that reproducibly causes the symptoms, try the saba 15 min before the activity on alternate days   If in fact the saba really does help, then fine to continue to use it prn but advised may need to look closer at the maintenance regimen being used (try on none for now )  to achieve better control of airways disease with exertion.

## 2024-03-25 NOTE — Assessment & Plan Note (Addendum)
 Try off ACEi 03/24/2024 due to cough and mild pseudowheeze   Upper airway cough syndrome (previously labeled PNDS),  is so named because it's frequently impossible to sort out how much is  CR/sinusitis with freq throat clearing (which can be related to primary GERD)   vs  causing  secondary ( extra esophageal)  GERD from wide swings in gastric pressure that occur with throat clearing, often  promoting self use of mint and menthol lozenges that reduce the lower esophageal sphincter tone and exacerbate the problem further in a cyclical fashion.   These are the same pts (now being labeled as having irritable larynx syndrome by some cough centers) who not infrequently have a history of having failed to tolerate ace inhibitors,  dry powder inhalers or biphosphonates or report having atypical/extraesophageal reflux symptoms from LPR (globus, throat clearing)  that don't respond to standard doses of PPI  and are easily confused as having aecopd or asthma flares by even experienced allergists/ pulmonologists (myself included).  >>> try off acei and DPI inhalers and just use saba prn for now (see below)

## 2024-03-25 NOTE — Assessment & Plan Note (Addendum)
 D/c ACEi 03/24/2024 due to cough /pseudowheeze   ACE inhibitors are problematic in  pts with airway complaints because  even experienced pulmonologists can't always distinguish ace effects from copd/asthma.  By themselves they don't actually cause a problem, much like oxygen can't by itself start a fire, but they certainly serve as a powerful catalyst or enhancer for any fire  or inflammatory process in the upper airway, be it caused by an ET  tube or more commonly reflux (especially in the obese or pts with known GERD or who are on biphoshonates).    In the era of ARB near equivalency until we have a better handle on the reversibility of the airway problem, it just makes sense to avoid ACEI  entirely in the short run and then decide later, having established a level of airway control using a reasonable limited regimen, whether to add back ace but even then being very careful to observe the pt for worsening airway control and number of meds used/ needed to control symptoms.   >>> try valsartan  80 mg daily can self titrate by breaking in half if needed   Each maintenance medication was reviewed in detail including emphasizing most importantly the difference between maintenance and prns and under what circumstances the prns are to be triggered using an action plan format where appropriate.  Total time for H and P, chart review, counseling, reviewing hfa/ dpi device(s) , directly observing portions of ambulatory 02 saturation study/ and generating customized AVS unique to this office visit / same day charting = 34 min with pt new to me

## 2024-03-27 LAB — IGE: IgE (Immunoglobulin E), Serum: 62 kU/L

## 2024-03-28 ENCOUNTER — Ambulatory Visit: Payer: Self-pay | Admitting: Internal Medicine

## 2024-03-30 ENCOUNTER — Other Ambulatory Visit: Payer: Self-pay | Admitting: Podiatry

## 2024-03-31 ENCOUNTER — Encounter: Payer: Self-pay | Admitting: Family Medicine

## 2024-03-31 ENCOUNTER — Other Ambulatory Visit: Payer: Self-pay

## 2024-03-31 MED ORDER — MELOXICAM 15 MG PO TABS: 15.0000 mg | ORAL_TABLET | Freq: Every day | ORAL | 1 refills | Status: AC

## 2024-04-07 NOTE — Progress Notes (Signed)
 Daniel [name]                                          MRN: [id number]   04/07/2024   The [facility] Quality Team Specialist reviewed this patient medical record for the purposes of chart review for care gap closure. The following were reviewed: chart review for care gap closure-diabetic eye exam.    [facility] Quality Team

## 2024-04-20 ENCOUNTER — Other Ambulatory Visit

## 2024-04-21 ENCOUNTER — Other Ambulatory Visit

## 2024-04-21 DIAGNOSIS — R809 Proteinuria, unspecified: Secondary | ICD-10-CM

## 2024-04-21 LAB — MICROALBUMIN / CREATININE URINE RATIO
Creatinine,U: 140.8 mg/dL
Microalb Creat Ratio: 17.3 mg/g (ref 0.0–30.0)
Microalb, Ur: 2.4 mg/dL — ABNORMAL HIGH (ref 0.7–1.9)

## 2024-04-22 ENCOUNTER — Ambulatory Visit: Payer: Self-pay | Admitting: Family Medicine

## 2024-04-23 ENCOUNTER — Other Ambulatory Visit: Payer: Self-pay | Admitting: Family Medicine

## 2024-04-23 NOTE — Telephone Encounter (Signed)
 Sched at his convenience in next mo or so. I want to see him twice yearly for maintenance labs/urine. Thx.

## 2024-04-29 ENCOUNTER — Ambulatory Visit: Admitting: Internal Medicine

## 2024-04-29 ENCOUNTER — Encounter
